# Patient Record
Sex: Female | Born: 1935 | Race: White | Hispanic: No | Smoking: Former smoker
Health system: Southern US, Community
[De-identification: ages and names within clinical notes are randomized; demographics above are authoritative.]

## PROBLEM LIST (undated history)

## (undated) DIAGNOSIS — F0390 Unspecified dementia without behavioral disturbance: Secondary | ICD-10-CM

## (undated) DIAGNOSIS — G9340 Encephalopathy, unspecified: Secondary | ICD-10-CM

## (undated) DIAGNOSIS — K219 Gastro-esophageal reflux disease without esophagitis: Secondary | ICD-10-CM

## (undated) DIAGNOSIS — N200 Calculus of kidney: Secondary | ICD-10-CM

## (undated) DIAGNOSIS — I2699 Other pulmonary embolism without acute cor pulmonale: Secondary | ICD-10-CM

## (undated) DIAGNOSIS — I714 Abdominal aortic aneurysm, without rupture, unspecified: Secondary | ICD-10-CM

## (undated) DIAGNOSIS — K8020 Calculus of gallbladder without cholecystitis without obstruction: Secondary | ICD-10-CM

## (undated) DIAGNOSIS — I2510 Atherosclerotic heart disease of native coronary artery without angina pectoris: Secondary | ICD-10-CM

## (undated) DIAGNOSIS — K56609 Unspecified intestinal obstruction, unspecified as to partial versus complete obstruction: Secondary | ICD-10-CM

## (undated) DIAGNOSIS — J449 Chronic obstructive pulmonary disease, unspecified: Secondary | ICD-10-CM

## (undated) DIAGNOSIS — I739 Peripheral vascular disease, unspecified: Secondary | ICD-10-CM

## (undated) DIAGNOSIS — N183 Chronic kidney disease, stage 3 (moderate): Secondary | ICD-10-CM

## (undated) DIAGNOSIS — E785 Hyperlipidemia, unspecified: Secondary | ICD-10-CM

## (undated) DIAGNOSIS — D352 Benign neoplasm of pituitary gland: Secondary | ICD-10-CM

## (undated) DIAGNOSIS — I498 Other specified cardiac arrhythmias: Secondary | ICD-10-CM

## (undated) DIAGNOSIS — I10 Essential (primary) hypertension: Secondary | ICD-10-CM

## (undated) DIAGNOSIS — E870 Hyperosmolality and hypernatremia: Secondary | ICD-10-CM

## (undated) DIAGNOSIS — M1990 Unspecified osteoarthritis, unspecified site: Secondary | ICD-10-CM

## (undated) DIAGNOSIS — K567 Ileus, unspecified: Secondary | ICD-10-CM

## (undated) DIAGNOSIS — N19 Unspecified kidney failure: Secondary | ICD-10-CM

## (undated) DIAGNOSIS — F17201 Nicotine dependence, unspecified, in remission: Secondary | ICD-10-CM

## (undated) DIAGNOSIS — D649 Anemia, unspecified: Secondary | ICD-10-CM

## (undated) DIAGNOSIS — F418 Other specified anxiety disorders: Secondary | ICD-10-CM

## (undated) DIAGNOSIS — E876 Hypokalemia: Secondary | ICD-10-CM

## (undated) HISTORY — DX: Abdominal aortic aneurysm, without rupture: I71.4

## (undated) HISTORY — DX: Hyperlipidemia, unspecified: E78.5

## (undated) HISTORY — DX: Abdominal aortic aneurysm, without rupture, unspecified: I71.40

## (undated) HISTORY — DX: Gastro-esophageal reflux disease without esophagitis: K21.9

## (undated) HISTORY — PX: KIDNEY STONE SURGERY: SHX686

## (undated) HISTORY — DX: Peripheral vascular disease, unspecified: I73.9

## (undated) HISTORY — DX: Other specified anxiety disorders: F41.8

## (undated) HISTORY — DX: Chronic obstructive pulmonary disease, unspecified: J44.9

## (undated) HISTORY — DX: Essential (primary) hypertension: I10

---

## 1999-12-23 HISTORY — PX: CARDIAC CATHETERIZATION: SHX172

## 2000-03-25 ENCOUNTER — Inpatient Hospital Stay (HOSPITAL_COMMUNITY): Admission: EM | Admit: 2000-03-25 | Discharge: 2000-03-31 | Payer: Self-pay | Admitting: *Deleted

## 2000-03-27 ENCOUNTER — Encounter: Payer: Self-pay | Admitting: *Deleted

## 2000-03-27 ENCOUNTER — Encounter: Payer: Self-pay | Admitting: Cardiovascular Disease

## 2000-08-13 ENCOUNTER — Encounter: Payer: Self-pay | Admitting: Cardiovascular Disease

## 2000-08-13 ENCOUNTER — Inpatient Hospital Stay (HOSPITAL_COMMUNITY): Admission: AD | Admit: 2000-08-13 | Discharge: 2000-08-18 | Payer: Self-pay | Admitting: Cardiovascular Disease

## 2000-08-14 ENCOUNTER — Encounter: Payer: Self-pay | Admitting: Cardiovascular Disease

## 2000-08-15 ENCOUNTER — Encounter: Payer: Self-pay | Admitting: Cardiovascular Disease

## 2001-04-09 ENCOUNTER — Ambulatory Visit (HOSPITAL_COMMUNITY): Admission: RE | Admit: 2001-04-09 | Discharge: 2001-04-09 | Payer: Self-pay | Admitting: Pulmonary Disease

## 2001-08-25 ENCOUNTER — Emergency Department (HOSPITAL_COMMUNITY): Admission: EM | Admit: 2001-08-25 | Discharge: 2001-08-25 | Payer: Self-pay | Admitting: Emergency Medicine

## 2001-08-25 ENCOUNTER — Encounter: Payer: Self-pay | Admitting: Emergency Medicine

## 2001-11-16 ENCOUNTER — Inpatient Hospital Stay (HOSPITAL_COMMUNITY): Admission: AD | Admit: 2001-11-16 | Discharge: 2001-11-17 | Payer: Self-pay | Admitting: *Deleted

## 2001-11-16 ENCOUNTER — Encounter: Payer: Self-pay | Admitting: Cardiology

## 2002-01-04 ENCOUNTER — Encounter: Payer: Self-pay | Admitting: Cardiovascular Disease

## 2002-01-04 ENCOUNTER — Inpatient Hospital Stay (HOSPITAL_COMMUNITY): Admission: EM | Admit: 2002-01-04 | Discharge: 2002-01-07 | Payer: Self-pay | Admitting: Emergency Medicine

## 2002-01-05 ENCOUNTER — Other Ambulatory Visit: Payer: Self-pay

## 2002-01-15 ENCOUNTER — Encounter: Payer: Self-pay | Admitting: *Deleted

## 2002-01-15 ENCOUNTER — Emergency Department (HOSPITAL_COMMUNITY): Admission: EM | Admit: 2002-01-15 | Discharge: 2002-01-15 | Payer: Self-pay | Admitting: *Deleted

## 2002-06-30 ENCOUNTER — Emergency Department (HOSPITAL_COMMUNITY): Admission: EM | Admit: 2002-06-30 | Discharge: 2002-06-30 | Payer: Self-pay | Admitting: Emergency Medicine

## 2002-06-30 ENCOUNTER — Encounter: Payer: Self-pay | Admitting: Emergency Medicine

## 2002-08-10 ENCOUNTER — Emergency Department (HOSPITAL_COMMUNITY): Admission: EM | Admit: 2002-08-10 | Discharge: 2002-08-10 | Payer: Self-pay | Admitting: Emergency Medicine

## 2002-08-10 ENCOUNTER — Encounter: Payer: Self-pay | Admitting: Emergency Medicine

## 2003-02-22 ENCOUNTER — Ambulatory Visit (HOSPITAL_COMMUNITY): Admission: RE | Admit: 2003-02-22 | Discharge: 2003-02-22 | Payer: Self-pay | Admitting: Urology

## 2003-02-22 ENCOUNTER — Encounter: Payer: Self-pay | Admitting: Urology

## 2003-05-04 ENCOUNTER — Inpatient Hospital Stay (HOSPITAL_COMMUNITY): Admission: EM | Admit: 2003-05-04 | Discharge: 2003-05-10 | Payer: Self-pay | Admitting: Emergency Medicine

## 2003-05-04 ENCOUNTER — Encounter: Payer: Self-pay | Admitting: Emergency Medicine

## 2004-02-05 ENCOUNTER — Emergency Department (HOSPITAL_COMMUNITY): Admission: EM | Admit: 2004-02-05 | Discharge: 2004-02-05 | Payer: Self-pay | Admitting: Emergency Medicine

## 2004-03-24 ENCOUNTER — Inpatient Hospital Stay (HOSPITAL_COMMUNITY): Admission: EM | Admit: 2004-03-24 | Discharge: 2004-03-28 | Payer: Self-pay | Admitting: Emergency Medicine

## 2004-06-20 ENCOUNTER — Emergency Department (HOSPITAL_COMMUNITY): Admission: EM | Admit: 2004-06-20 | Discharge: 2004-06-20 | Payer: Self-pay | Admitting: Emergency Medicine

## 2004-09-10 ENCOUNTER — Emergency Department (HOSPITAL_COMMUNITY): Admission: EM | Admit: 2004-09-10 | Discharge: 2004-09-10 | Payer: Self-pay | Admitting: Emergency Medicine

## 2004-10-10 ENCOUNTER — Encounter (HOSPITAL_COMMUNITY): Admission: RE | Admit: 2004-10-10 | Discharge: 2004-11-09 | Payer: Self-pay | Admitting: Orthopaedic Surgery

## 2004-12-23 ENCOUNTER — Emergency Department (HOSPITAL_COMMUNITY): Admission: EM | Admit: 2004-12-23 | Discharge: 2004-12-23 | Payer: Self-pay | Admitting: Emergency Medicine

## 2004-12-27 ENCOUNTER — Ambulatory Visit (HOSPITAL_COMMUNITY): Admission: RE | Admit: 2004-12-27 | Discharge: 2004-12-27 | Payer: Self-pay | Admitting: Emergency Medicine

## 2005-09-02 ENCOUNTER — Ambulatory Visit (HOSPITAL_COMMUNITY): Admission: RE | Admit: 2005-09-02 | Discharge: 2005-09-02 | Payer: Self-pay | Admitting: Orthopaedic Surgery

## 2005-09-02 ENCOUNTER — Encounter: Payer: Self-pay | Admitting: Orthopedic Surgery

## 2006-05-05 ENCOUNTER — Ambulatory Visit (HOSPITAL_COMMUNITY): Admission: RE | Admit: 2006-05-05 | Discharge: 2006-05-05 | Payer: Self-pay | Admitting: Pulmonary Disease

## 2006-05-22 ENCOUNTER — Emergency Department (HOSPITAL_COMMUNITY): Admission: EM | Admit: 2006-05-22 | Discharge: 2006-05-22 | Payer: Self-pay | Admitting: Emergency Medicine

## 2006-09-12 ENCOUNTER — Emergency Department (HOSPITAL_COMMUNITY): Admission: EM | Admit: 2006-09-12 | Discharge: 2006-09-12 | Payer: Self-pay | Admitting: Emergency Medicine

## 2006-09-17 ENCOUNTER — Emergency Department (HOSPITAL_COMMUNITY): Admission: EM | Admit: 2006-09-17 | Discharge: 2006-09-18 | Payer: Self-pay | Admitting: Emergency Medicine

## 2007-01-18 ENCOUNTER — Ambulatory Visit (HOSPITAL_COMMUNITY): Admission: RE | Admit: 2007-01-18 | Discharge: 2007-01-18 | Payer: Self-pay | Admitting: Pulmonary Disease

## 2007-04-27 ENCOUNTER — Ambulatory Visit (HOSPITAL_COMMUNITY): Admission: RE | Admit: 2007-04-27 | Discharge: 2007-04-27 | Payer: Self-pay | Admitting: Neurosurgery

## 2007-08-16 ENCOUNTER — Encounter: Payer: Self-pay | Admitting: Neurosurgery

## 2007-08-20 ENCOUNTER — Inpatient Hospital Stay (HOSPITAL_COMMUNITY): Admission: EM | Admit: 2007-08-20 | Discharge: 2007-08-22 | Payer: Self-pay | Admitting: Emergency Medicine

## 2007-09-22 HISTORY — PX: TRANSPHENOIDAL / TRANSNASAL HYPOPHYSECTOMY / RESECTION PITUITARY TUMOR: SUR1382

## 2007-10-15 ENCOUNTER — Encounter (INDEPENDENT_AMBULATORY_CARE_PROVIDER_SITE_OTHER): Payer: Self-pay | Admitting: Neurosurgery

## 2007-10-15 ENCOUNTER — Ambulatory Visit: Payer: Self-pay | Admitting: Internal Medicine

## 2007-10-15 ENCOUNTER — Inpatient Hospital Stay (HOSPITAL_COMMUNITY): Admission: RE | Admit: 2007-10-15 | Discharge: 2007-10-30 | Payer: Self-pay | Admitting: Neurosurgery

## 2009-09-27 ENCOUNTER — Ambulatory Visit (HOSPITAL_COMMUNITY)
Admission: RE | Admit: 2009-09-27 | Discharge: 2009-09-27 | Payer: Self-pay | Admitting: Physical Medicine and Rehabilitation

## 2009-09-27 ENCOUNTER — Encounter: Payer: Self-pay | Admitting: Orthopedic Surgery

## 2010-02-18 ENCOUNTER — Ambulatory Visit: Payer: Self-pay | Admitting: Cardiology

## 2010-06-04 ENCOUNTER — Encounter: Payer: Self-pay | Admitting: Orthopedic Surgery

## 2010-06-06 ENCOUNTER — Ambulatory Visit: Payer: Self-pay | Admitting: Orthopedic Surgery

## 2010-06-06 DIAGNOSIS — M1610 Unilateral primary osteoarthritis, unspecified hip: Secondary | ICD-10-CM | POA: Insufficient documentation

## 2010-06-20 ENCOUNTER — Encounter: Payer: Self-pay | Admitting: Orthopedic Surgery

## 2010-06-27 ENCOUNTER — Encounter: Payer: Self-pay | Admitting: Orthopedic Surgery

## 2010-10-22 ENCOUNTER — Encounter: Payer: Self-pay | Admitting: Orthopedic Surgery

## 2011-01-23 NOTE — Letter (Signed)
Summary: *Orthopedic Consult Note  [facility]  [address]  [place], [place] [address]   Phone: [phone]  Fax: [phone]    Re:    [name] [name] DOB:    April 01, 1935   Dear: Dr. Juanetta Gosling   Thank you for requesting that we see the above patient for consultation.  A copy of the detailed office note will be sent under separate cover, for your review.  Evaluation today is consistent with:  1)  ARTHRITIS, HIPS, BILATERAL (ICD-716.95)   Our recommendation is for: cardiac and medical evaluation, is she a candidate for hip replacement surgery at the [facility], if not then oral narcotic pain medication      Thank you for this opportunity to look after your patient.  Sincerely,   Terrance Mass. MD.

## 2011-01-23 NOTE — Letter (Signed)
Summary: History form  History form   Imported By: Jacklynn Ganong 06/10/2010 08:05:54  _____________________________________________________________________  External Attachment:    Type:   Image     Comment:   External Document

## 2011-01-23 NOTE — Miscellaneous (Signed)
Summary: Medicaation order  Medicaation order   Imported By: Jacklynn Ganong 06/28/2010 07:41:39  _____________________________________________________________________  External Attachment:    Type:   Image     Comment:   External Document

## 2011-01-23 NOTE — Miscellaneous (Signed)
Summary: Nursing Home order  Nursing Home order   Imported By: Cammie Sickle 06/11/2010 06:38:41  _____________________________________________________________________  External Attachment:    Type:   Image     Comment:   External Document

## 2011-01-23 NOTE — Medication Information (Signed)
Summary: prescription faxed to [facility]  prescription faxed to [facility]   Imported By: Jacklynn Ganong 10/22/2010 11:48:21  _____________________________________________________________________  External Attachment:    Type:   Image     Comment:   External Document

## 2011-01-23 NOTE — Assessment & Plan Note (Signed)
Summary: HIP PAIN/NEEDS XRAY/REF [email or web address]/MEDICARE,MEDICAID/CAF   Vital Signs:  Patient profile:   76 year old female Height:      63 inches Weight:      152 pounds Pulse rate:   74 / minute Resp:     16 per minute  Vitals Entered By: Fuller Canada MD (June 06, 2010 9:10 AM)  Visit Type:  Initial Consult Referring Provider:  Dr. Juanetta Gosling Primary Provider:  Dr. Juanetta Gosling  CC:  hip pain.  History of Present Illness: I saw [name] [name] in the office today for an initial visit.  She is a 76 years old woman with the complaint of:  bilateral hip pain.  Xrays bilateral hips and pelvis on 09/27/09 for review.  MRI L spine 2006 for review also.  Meds: Simvastatin, Norvasc, Lovaza, Xanax, Gas x, Pripilix         The patient presents with  hip pain.  The symptoms began 3 years ago.  On a scale of 1 to 10, the intensity is described as a 7/10.  the patient already is known to have avascular necrosis and grade 4 arthritis of both hips.  She was scheduled previously to be treated surgically for hip replacement but it was delayed.  Since that time she's had 2 myocardial infarctions the last one 5 years ago.  Her cardiologist is Dr. Tresa Endo and her general opinion is that she cannot have hip surgery.  She does not have any chest pain currently it is a minimal household ambulator with a rolling walker and occasionally gets short of breath.  She has bilateral hip pain and stiffness which is unrelieved by Tylenol.  She also has lumbar disc disease and is followed for that by a pain management specialist.    Allergies (verified): 1)  ! * Lidoderm 2)  ! Sulfa 3)  ! Nsaids  Past History:  Past Medical History: pancreatitis COPD CAD HTN OA Pituitary adenoma cholesterol anxiety  Past Surgical History: na  Family History: Family History Coronary Heart Disease female < 12 Family History of Arthritis  Social History: retired no smoking no alcohol some caffeine use  Review of  Systems Constitutional:  Denies weight loss, weight gain, fever, chills, and fatigue. Cardiovascular:  Denies chest pain, palpitations, fainting, and murmurs. Respiratory:  Denies short of breath, wheezing, couch, tightness, pain on inspiration, and snoring . Gastrointestinal:  Denies heartburn, nausea, vomiting, diarrhea, constipation, and blood in your stools. Genitourinary:  Denies frequency, urgency, difficulty urinating, painful urination, flank pain, and bleeding in urine. Neurologic:  Complains of unsteady gait; denies numbness, tingling, dizziness, tremors, and seizure. Musculoskeletal:  Complains of joint pain, stiffness, and muscle pain; denies swelling, instability, redness, and heat. Endocrine:  Denies excessive thirst, exessive urination, and heat or cold intolerance. Psychiatric:  Denies nervousness, depression, anxiety, and hallucinations. Skin:  Denies changes in the skin, poor healing, rash, itching, and redness. HEENT:  Denies blurred or double vision, eye pain, redness, and watering. Immunology:  Denies seasonal allergies, sinus problems, and allergic to bee stings. Hemoatologic:  Denies easy bleeding and brusing.  Physical Exam  Skin:  intact without lesions or rashes Inguinal Nodes:  no significant adenopathy Psych:  alert and cooperative; normal mood and affect; normal attention span and concentration   Hip Exam  General:    Well-developed,well-nourished,normal body habitus;no deformities, normal grooming.  Gait:    rolling walker shuffling gait  Inspection:    no deformity leg lengths are equal  Palpation:    tenderness over bilateral greater  trochanter LEFT greater than RIGHT  Vascular:    The pulses and perfusion were normal with normal color, temperature  and no swelling   Motor:    Motor strength 5/5 bilaterally for quadriceps, hamstrings, ankle dorsiflexion, ankle plantar flexion, .  Reflexes:    Normal and symmetric patellar and Achilles  reflexes bilaterally.    Hip Exam:    the patient is in so much pain that she only has 20-30 of hip flexion she has severe pain with internal rotation.  Her muscle tone in her legs is normal.  Her hips are stable from her we can tell.   Impression & Recommendations:  Problem # 1:  ARTHRITIS, HIPS, BILATERAL (ICD-716.95) Assessment New  x-rays AP pelvis AP lateral RIGHT in LEFT hips bilateral avascular necrosis with end-stage arthritis of the hips.  3 views RIGHT knee 3 views LEFT knee there is subtle joint space narrowing laterally and there's diffuse osteopenia bilaterally  MRI dated September 02, 2005 mild lumbar spondylosis with disc bulges starting at L2 and 3 and progressing through L5 and S1  This patient is not a surgical candidate according to the cardiologist and to her primary care physician.  She needs to be seen by the cardiologist again to make sure that she is not a surgical candidate.  If he think she can have the surgery would have to be done at [facility]  If not then pain medication is her only option.  She is not a candidate for injection.  She is not a candidate for therapy.  Orders: Consultation Level III (04540)  Medications Added to Medication List This Visit: 1)  Norco 5-325 Mg Tabs (Hydrocodone-acetaminophen) .Marland Kitchen.. 1 q 4 for pain  Patient Instructions: 1)  Please schedule a follow-up appointment as needed. 2)  not a candidate for surgery or injection 3)  REC ORAL NARCOTICS as needed  Prescriptions: NORCO 5-325 MG TABS (HYDROCODONE-ACETAMINOPHEN) 1 q 4 for pain  #90 x 5   Entered and Authorized by:   Fuller Canada MD   Signed by:   Fuller Canada MD on 06/06/2010   Method used:   Print then Give to Patient   RxID:   [id number]

## 2011-01-23 NOTE — Letter (Signed)
Summary: Office note from Dr. Kari Baars  Office note from Dr. Kari Baars   Imported By: Jacklynn Ganong 06/27/2010 09:33:04  _____________________________________________________________________  External Attachment:    Type:   Image     Comment:   External Document

## 2011-05-06 NOTE — Op Note (Signed)
NAME:  Scheurich, [name]              ACCOUNT NO.:  [id number]   MEDICAL RECORD NO.:  [id number]          PATIENT TYPE:  INP   LOCATION:  3112                         FACILITY:  [facility]   PHYSICIAN:  Coletta Memos, M.D.     DATE OF BIRTH:  1935/04/17   DATE OF PROCEDURE:  10/15/2007  DATE OF DISCHARGE:                               OPERATIVE REPORT   PREOPERATIVE DIAGNOSIS:  Pituitary tumor.   POSTOPERATIVE DIAGNOSIS:  Pituitary tumor.   PROCEDURES:  1. Resection of pituitary tumor with microdissection.  2. Abdominal fat graft taken via abdominal incision.   COMPLICATIONS:  None.   SURGEON:  Coletta Memos, M.D.   Mammie LorenzoEnrigue Catena H. Pollyann Kennedy, MD.  He will dictate the approach and  closure under separate note.   [name] [name] presented to me well over a year ago for a pituitary  tumor.  Secondary to a number of family issues and cardiac issues which  occurred recently, she had not been able to get her operation until  recently.  She has bilateral pressure on the optic nerves.  The tumor  essentially has remained stable from May 2007 to May 2008.  She was  admitted today for her tumor resection.   COMPLICATIONS:  None.   OPERATIVE NOTE:  Ms. Bowens was brought to the operating room,  intubated, placed under general anesthetic without difficulty.  A Foley  catheter was placed under sterile conditions.  She was positioned with  her head on a horseshoe headrest.  Fluoroscopy was set up so that we  could ensure that we were in the correct location.  After that was done,  Dr. Pollyann Kennedy exposed the sphenoid sinus.  At that point I took over.   With the microscope I was able to see what appeared to be dura, and I  was actually low in the sellar sphenoid sinus.  This was because the  tumor had eroded so much of the bone that I did not need any tool to  remove bone or go through bone as the dura was present once Dr. Pollyann Kennedy  entered the sphenoid sinus.  I coagulated the dura.  I then opened  the  dura in a cruciate fashion in the shape frankly of an X.  I then  immediately got grayish-white, milky substance and fluid, somewhat  thicker than milk obviously, but that came out under its own power  without any significant dissection.  I then proceeded to remove the  quadrants of the tumor first by starting the floor, then taking out the  right side, left side, and finally the roof of the tumor.  I did this  until I saw the diaphragma collapse into my resection cavity.  At that  point in time I then proceeded to take the abdominal fat graft.  This  was done without difficulty, first using a #10 blade to open the  abdomen.  I then removed the fat using Metzenbaum scissors.  I then  closed wound after achieving hemostasis with 3-0 Vicryl sutures, closing  it in layers.  Dermabond was used for a  sterile dressing.  That was done under sterile conditions.  I then place  a small piece of fat into the sphenoid sinus to help with hemostasis.  At that point Dr. Pollyann Kennedy closed the wound and, again, that will be  dictated under a separate note.           ______________________________  Coletta Memos, M.D.     KC/MEDQ  D:  10/15/2007  T:  10/16/2007  Job:  [id number]

## 2011-05-06 NOTE — Discharge Summary (Signed)
NAME:  [name] [name], [name] [name]              ACCOUNT NO.:  [id number]   MEDICAL RECORD NO.:  [id number]          PATIENT TYPE:  INP   LOCATION:  4732                         FACILITY:  [facility]   PHYSICIAN:  Nicki Guadalajara, M.D.     DATE OF BIRTH:  1935/10/02   DATE OF ADMISSION:  08/20/2007  DATE OF DISCHARGE:  08/22/2007                               DISCHARGE SUMMARY   DISCHARGE DIAGNOSES:  1. Acute tachycardia, probable atrial flutter with a right bundle      branch block, now resolved.  2. Hypotension, improved.  3. History of paroxysmal atrial fibrillation; at one point on      Betapace, but has not been on since at least May of 2008.  4. Coronary artery disease with previous left anterior descending and      right coronary artery stent in [place], [place] in April of      2001.  5. Known abdominal aortic aneurysm followed by Dr. Tresa Endo.  6. History of severe chronic obstructive pulmonary disease.   Dictation ended at this point.      Darcella Gasman. Annie Paras, N.P.    ______________________________  Nicki Guadalajara, M.D.    LRI/MEDQ  D:  08/22/2007  T:  08/22/2007  Job:  [id number]   cc:   Ramon Dredge L. Juanetta Gosling, M.D.  Coletta Memos, M.D.

## 2011-05-06 NOTE — Consult Note (Signed)
NAME:  [name], [name]              ACCOUNT NO.:  [id number]   MEDICAL RECORD NO.:  [id number]          PATIENT TYPE:  INP   LOCATION:  3112                         FACILITY:  [facility]   PHYSICIAN:  Alfonse Alpers. Gegick, M.D.DATE OF BIRTH:  [birth year]   DATE OF CONSULTATION:  10/20/2007  DATE OF DISCHARGE:                                 CONSULTATION   REASON FOR CONSULTATION:  This is a 76 year old woman who has had a  history of known pituitary disease for approximately 2 years.  She has  had field of vision deficits due to a pituitary tumor and is admitted to  the hospital and had a pituitary tumor resection.  We are seeing her  postoperatively on her fifth day.  At this time, her treatment includes  25 mg of hydrocortisone three times a day.  She also has a history of  schizophrenia making history taking from the patient impossible.  She is  currently hallucinating at this time.   PAST MEDICAL HISTORY:  1. History of arteriosclerotic heart disease.  2. According to the record, she also has a history of chronic      obstructive pulmonary disease.  3. History of cigarette smoking.  4. She had two stents placed several years ago.  5. She is also being followed by cardiology during her hospitalization      for tachyarrhythmias that are occurring.   Her past medical history is essentially as noted in the records and  obtainable from the patient.  She does have a history of his heart  disease as noted above.  Her electrocardiograms actually show low  voltage.   PHYSICAL EXAMINATION:  GENERAL:  This a well-developed woman seen in bed  lying down in restraints.  She is obviously pointing in different  directions as if she is seeing things which would be consistent with her  hallucinations.  CARDIAC:  Her cardiovascular exam reveals the rhythm to be regular.  LUNGS:  Clear.  ABDOMEN:  Soft.  EXTREMITIES:  There is no edema present.   IMPRESSION/RECOMMENDATIONS:  During her hospital  stay, she does not  appear to have any evidence of diabetes insipidus.  However, her last  two in's and out's indicate a negative fluid balance of -530 mL and -810  mL.  I imagine there is some complication and some difficulty in  actually obtaining the values with this patient.  Her thyroid function  studies were done recently.  Her T4 was 6.1.  TSH was 0.9.  More than  likely, her pituitary is intact and her hydrocortisone will be gradually  decreased at this time.           ______________________________  Alfonse Alpers Dagoberto Ligas, M.D.     CGG/MEDQ  D:  10/20/2007  T:  10/21/2007  Job:  [id number]

## 2011-05-06 NOTE — Discharge Summary (Signed)
NAME:  [name], [name]              ACCOUNT NO.:  [id number]   MEDICAL RECORD NO.:  [id number]          PATIENT TYPE:  INP   LOCATION:  4732                         FACILITY:  [facility]   PHYSICIAN:  [name] [name], M.D.     DATE OF BIRTH:  1935-08-09   DATE OF ADMISSION:  08/20/2007  DATE OF DISCHARGE:  08/22/2007                               DISCHARGE SUMMARY   DISCHARGE DIAGNOSES:  1. Atrial flutter with a heart rate of 136, resolved.  2. Hypotension, improved.  3. Elevated creatinine and renal insufficiency, improved at discharge.  4. Pituitary tumor followed by Dr. Franky [name], was here at [facility] for      surgery, which was cancelled  5. Coronary artery disease, previous left anterior descending and      right coronary artery stent in [place], 2001.  6. Severe chronic obstructive pulmonary disease on home oxygen.  7. Carotid disease with a 0-49% bilateral carotid stenosis.  8. Normal left ventricular function.  9. Abdominal aortic aneurysm followed by Dr. Tresa [name].  10.History of nephrolithiasis.  11.Urinary tract infection.   DISCHARGE CONDITION:  Improved, sinus rhythm.   DISCHARGE MEDICATIONS:  1. Nexium 40 mg daily.  2. Enteric-coated aspirin 81 mg daily.  3. Cardizem CD 120 mg daily.  4. Cipro 250 mg daily for 3 days.  5. Oxygen 2 L at home as before.   HISTORY OF PRESENT ILLNESS:  A 76 year old female who is followed by Dr.  Tresa [name] and Dr. Juanetta [name] in [place] with a history of coronary disease  with LAD and RCA stent in [place] in 2001.  She also has a history  of PAF, had been on Betapace at one point but is no longer as of May  2008.  She had been seen by Dr. Tresa [name] for surgical clearance back in May  for pituitary surgery by Dr. Franky [name].  A 2-D echo and Myoview study were  done.  There was no ischemia on the Myoview, study EF was 81%.  The echo  was normal.  She also carotid Dopplers with 0-49% bilateral internal  carotid artery narrowing, and upper extremity  Dopplers were normal.   The patient had presented to the emergency room August 20, 2007, as she  came from being prepared for surgery with tachycardia, heart rate 136,  appears to be atrial flutter on EKG.  She was admitted to [facility].  Cardizem was given.  It was also noted her blood pressure was 86/50.  She converted to sinus rhythm up with her Cardizem, and this was changed  to p.o. Cardizem.  Additionally, her creatinine was 2, BUN was 55, so  her Lasix and lisinopril were held.   PAST MEDICAL HISTORY:  1. As stated above.  2. Severe COPD with multiple admissions for bronchitis, quit tobacco 2      years ago.  3. She has dyslipidemia, not on medication.  4. Coronary artery disease as stated.  5. A history of kidney stones.  6. Degenerative joint disease in her right hip and may need surgery      there as well.  7. She has dizziness and  falls at home.   OUTPATIENT MEDICATIONS:  1. Lasix 40 mg daily.  2. Lisinopril 40 mg.  3. Naprosyn b.i.d.  4. Nexium 40 mg daily.  5. Xanax p.r.n.   Family history, social history, review of systems:  Please see H&P.   PHYSICAL EXAMINATION ON DISCHARGE:  Blood pressure anywhere from 92/58  to 154/88, pulse 69, respirations 20, temperature 92.8 __________,  oxygen saturation 98% room air, I&O's pretty equivocal.  On exam, lungs were clear to auscultation.  HEART:  Regular rate and rhythm.   LABORATORY DATA:  Hemoglobin initially 13.3, hematocrit 39.6, WBC 6.8,  platelets 215.  At discharge, hemoglobin 11.1, hematocrit 32.8, WBC 5.9,  platelets a little low at 146, and MCV 84.6.  Chemistry:  Initially  creatinine was 2, at discharge it is down to 1.26.  sodium is 140,  potassium 4.0, chloride 112, CO2 25, BUN 22, glucose 98.  Coagulation  studies:  PT 12.9, INR 1.0, PTT 30.  LFTs were normal except albumin was  a little low at 3.3.  Cardiac enzymes were negative:  CK was 34 and 35  with MBs of 1.0 and 0.8, troponin I 0.04 x2.  Calcium 9.2.   UA:  Large  leukocytes, bacteria, too many wbc's to count, culture is pending.  TSH  3.053.   Chest x-ray:  This was on the 25th in preparation for surgery, no  evidence for acute cardiopulmonary disease.  Scoliosis is present.   EKG initially was tachycardia, right bundle branch block, appears to be  flutter.  She was started on Cardizem and converted.  Follow-up EKG:  Sinus bradycardia, rate of 57, right bundle branch block.   HOSPITAL COURSE:  The patient was admitted August 20, 2007, by Dr.  Jenne [name] on call for Dr. Tresa [name], secondary to PAF, probably 2:1 flutter.  She was also found to be hypotensive and dehydrated.  IV fluids were  given.  IV Cardizem was changed to p.o.   She continued to be stable and by August 22, 2007, she had negative MI,  creatinine was down to 1.26.  She did have a urinary tract infection on  urinalysis.  Blood pressure was improved but it still would drop down to  92, so therefore her medications were held and she will follow up with  Dr. Tresa [name] in a week, either in [place] or [place] to further  evaluate her for surgery due to the atrial flutter.  Will recheck of  basic metabolic panel as an outpatient and a CBC to ensure platelets  continued to climb.   DISCHARGE MEDICATIONS:  1. Nexium 40 mg daily.  2. Enteric-coated aspirin 81 mg daily.  3. Cardizem CD 120 mg daily.  4. Cipro 250 mg once daily x3 days.  5. Xanax as before.  6. O2 2 L as before.  7. Stop Lasix, lisinopril and naproxen.   She will also have blood work done on Tuesday August 24, 2007, in  [place].      [name] [name], N.P.    ______________________________  [name] [name], M.D.    LRI/MEDQ  D:  08/22/2007  T:  08/23/2007  Job:  [id number]   cc:   [name] [name], M.D.  [name] [name], M.D.

## 2011-05-06 NOTE — Discharge Summary (Signed)
NAME:  [name] [name], [name] [name]              ACCOUNT NO.:  [id number]   MEDICAL RECORD NO.:  [id number]          PATIENT TYPE:  INP   LOCATION:  3022                         FACILITY:  [facility]   PHYSICIAN:  Coletta Memos, M.D.     DATE OF BIRTH:  [birth year]   DATE OF ADMISSION:  10/15/2007  DATE OF DISCHARGE:  10/30/2007                               DISCHARGE SUMMARY   ADMISSION DIAGNOSES:  1. Pituitary macroadenoma.  2. Decreased vision bilaterally.   DISCHARGE DIAGNOSES:  1. Pituitary macroadenoma.  2. Cardiac arrhythmia.  She had known coronary artery disease and      developed tachycardia with some blood pressure changes while here      in the hospital.  She had a history of atrial fibrillation in the      past.   [name] was admitted, taken to the operating room on October 15, 2007, where she underwent frankly an uncomplicated transsphenoidal  pituitary tumor resection.  Postoperatively, with regards with an  endocrine status, she has done quite well.  She was seen by Dr. Dagoberto Ligas  and he has been able to withdraw hydrocortisone and has been managing  her other levels.  She is doing quite well with regards to that.  Ms.  Corona had this macroadenoma for quite some time and she did have some  visual field damage as a result of it.  She also developed some  arrhythmias postoperatively.  She also had a brief episode of psychosis  which was well controlled and at discharge she has not been on any  medications for least a week.  She was treated by the cardiologist and  they increased her diltiazem for a period of time, but she will be  discharged home on the same dose that she was when she arrived.  She is  off hydrocortisone at discharge.  She will be seen by me in the office  in approximately 1 month postop.  She has done quite well.  Pathology  showed epithelial neoplasm consistent with pituitary adenoma.           ______________________________  Coletta Memos, M.D.     KC/MEDQ   D:  10/29/2007  T:  10/30/2007  Job:  [id number]

## 2011-05-06 NOTE — H&P (Signed)
NAME:  [name], [name]              ACCOUNT NO.:  [id number]   MEDICAL RECORD NO.:  [id number]          PATIENT TYPE:  INP   LOCATION:  2899                         FACILITY:  [facility]   PHYSICIAN:  Nicki Guadalajara, M.D.     DATE OF BIRTH:  05-26-1935   DATE OF ADMISSION:  08/20/2007  DATE OF DISCHARGE:  08/20/2007                              HISTORY & PHYSICAL   CHIEF COMPLAINT:  Rapid heart rate and weakness.   HPI:  Ms. [name] [name] is a 76 year old female who has been followed by Dr.  Tresa Endo and Dr. Juanetta Gosling.  She is from [place].  She has a history of  coronary disease.  She had had a previous LAD and RCA stent in  [place] in April of 2001.  Apparently, this was complicated by  bleeding, it is not clear whether this was a groin bleed or a GI bleed.  She ended up being transfused.  She has been studied twice since, in  August of 2001 and in January of 2003 and at that time her coronaries  were patent.  She last saw Dr. Tresa Endo in May of 2008.  She apparently has  been noncompliant with her medications.  She apparently has a history of  atrial fibrillation and was on Betapace in the past but she was not on  this when she saw Dr. Tresa Endo in May of 2008.  She was supposed to have  pituitary surgery by Dr. Franky Macho.  Dr. Tresa Endo ordered an echocardiogram  and Myoview study.  When he saw her in the office she was in sinus  rhythm.  Myoview study done June 10, 2007, shows normal perfusion with  an EF of 81%.  Echocardiogram revealed normal LV function.  She had also  had upper extremity Dopplers done because of some trouble getting her  blood pressure in her upper arms.  We were concerned that she may have  subclavian artery stenosis.  Carotid Dopplers done June 10, 2007, shows  a 0 to 49% bilateral internal carotid artery narrowing.  There were  normal upper extremity Dopplers.  The patient is admitted to the  emergency room today after she had rapid atrial fibrillation with  hypotension while in  the preop holding area.  She was relatively  asymptomatic with this.  In the emergency room she is now in sinus  rhythm after Cardizem but her pressure remains in the 80's.  Her BUN and  creatinine are elevated, although it is not clear where baseline is.  In  the ER, her BUN is 55, creatinine 2.  From a cardiac standpoint she is  relatively asymptomatic, she denies any chest pain or shortness of  breath.  Her daughter is with her and says that she has frequent falls  at home and is frequently dizzy.  She will admitted now for further  evaluation.   CURRENT MEDICATIONS:  1. Lasix 40 mg a day.  2. Lisinopril 40 mg a day.  3. Naprosyn b.i.d.  4. Nexium 40 mg a day.  5. Xanax p.r.n.   SHE IS ALLERGIC TO SULFA.   PAST MEDICAL HISTORY:  Remarkable for a known abdominal aortic aneurysm  which Dr. Tresa Endo follows.  She has a history of severe COPD, she had had  multiple admissions in the past in [facility] for bronchitis, she quit  smoking 2 years ago.  She has treated dyslipidemia.  She has a history  of PAF in the past, she had been on Betapace at one point but does not  take it now, and I see no indication that she was on Coumadin.   SOCIAL HISTORY:  The patient is a widow, lives alone and takes care of  her 74 year old mother.  She has 5 children, 13 grandchildren, 23 great-  grandchildren and 8 great-great grandchildren.  As noted above, she quit  smoking 2 years ago.   FAMILY HISTORY:  Unremarkable.   REVIEW OF SYSTEMS:  Remarkable for a history of kidney stones.  She has  had previous surgery for this.  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  As noted above, she has  had dizziness and falls at home.   PHYSICAL EXAM:  Blood pressure 86/50, pulse initially was 133, now 56,  respirations 12.  GENERAL:  She is an elderly female who looks older than her stated age  of 53, in no acute distress.  HEENT:  Normocephalic.  Extraocular  movements are intact.  Sclera is  nonicteric.  NECK:  Without JVD or bruit.  CHEST:  Scoliosis with scattered rhonchi.  CARDIAC EXAM:  Diminished heart sounds with a regular rate and rhythm,  no obvious murmur.  ABDOMEN:  Tenderness diffusely.  She has a left upper quadrant surgical  scar.  I could detect no bruit.  EXTREMITIES:  Diminished distal pulses but no edema.  There are no  femoral artery bruits noted.  Skin is warm and dry.   LABS:  Sodium 137, potassium 3.7, BUN 55, creatinine 2, white count 8.5,  hemoglobin 12.9, hematocrit 37.8, platelets 176,000.  Troponin is  negative.  EKG in sinus rhythm, showed sinus rhythm without acute  changes.  Chest x-ray shows scoliosis, COPD.   IMPRESSION:  1. Paroxysmal atrial fibrillation.  2. Hypotension, suspected dehydration.  3. Known coronary disease right coronary artery and left anterior      descending, percutaneous coronary intervention in April of 2001 in      [place] with documented patency, August, 2001, January, 2003,      and nonischemic Myoview June of 2008.  4. Preserved left ventricular function.  5. Severe chronic obstructive pulmonary disease.  6. Known peripheral vascular disease with abdominal aortic aneurysm,      followed by Dr. Tresa Endo.  7. Renal insufficiency.  8. History of kidney stones.  9. Pituitary tumor, followed by [facility].   PLAN:  1. Patient's surgery has been cancelled.  2. She will be admitted to Telemetry.  3. She will be hydrated.  4. Will follow her renal function, will hold her ACE inhibitor and      diuretics for now.  5. The patient does have a poor social situation at home and the      daughter is aware of this.  This may need to be addressed at      discharge.      Abelino Derrick, P.A.    ______________________________  Nicki Guadalajara, M.D.   Lenard Lance  D:  08/20/2007  T:  08/20/2007  Job:  [id number]

## 2011-05-06 NOTE — Consult Note (Signed)
NAME:  [name] [name], [name] [name]              ACCOUNT NO.:  [id number]   MEDICAL RECORD NO.:  [id number]          PATIENT TYPE:  INP   LOCATION:  3112                         FACILITY:  [facility]   PHYSICIAN:  Antonietta Breach, M.D.  DATE OF BIRTH:  1935/10/17   DATE OF CONSULTATION:  10/20/2007  DATE OF DISCHARGE:                                 CONSULTATION   REQUESTING PHYSICIAN:  Coletta Memos, M.D.   REASON FOR CONSULTATION:  Psychosis.   HISTORY OF PRESENT ILLNESS:  [name] [name] is a 76 year old female  admitted to the [facility] on October 05, 2007 with a pituitary  gland neoplasm.  She has erupted with approximately 72 hours of  progressive delusions and hallucinations.  She has been severely  agitated and has required restraints in the intensive care unit.  The  patient has received 2 mg of Haldol late last night, 2 mg of Haldol this  morning as well as another 1 mg today.  She still continues with  agitation and hallucinations.  She also has inappropriate euphoria.  She  has time orientation difficulties as well as partial place difficulties.  She does have intact remote memory.  The patient's daughter states that  she has been on 2 mg of Xanax a day at home for treating feeling on  edge, muscle tension and excessive worry.  The Xanax was stopped for  approximately a few days.  It has been restarted in the hospital.   PAST PSYCHIATRIC HISTORY:  [name] [name] does not have a prior history of  the kinds of symptoms noted above.  Her daughter states that in previous  hospitalization this has never occurred.  The patient does have a long  term history of requiring benzodiazepine therapy for anxiety, treating  muscle tension, feeling on edge and excessive worry.  In review of the  past medical record, Xanax is listed in April of 2001.   FAMILY PSYCHIATRIC HISTORY:  None known.   SOCIAL HISTORY:  [name] [name] lives with her 73 year old mother and takes  care of her.  The patient's  daughter reports that she has probably lost  much sleep in being the caretaker.  The patient is widowed.  She does  not use alcohol or illegal drugs.  She has 5 children, 13 grandchildren,  93 great-grandchildren and 8 great-great-grandchildren.  Her daughter is  visiting her regularly at the bedside.  The patient was talking to her  son on the phone earlier, and the son was telling the sister that the  patient was not making sense.   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   PAST MEDICAL HISTORY:  1. Pituitary tumor  2. History of aortic aneurysm.  3. Chronic obstructive pulmonary disease.  4. Bronchitis.  5. Dyslipidemia.   ALLERGIES:  Sulfa.   MEDICATIONS:  MAR is reviewed.  The patient is on the following  Psychotropics:  1. Xanax 1 mg b.i.d.  2. Haldol 1 to 2 mg every 4 hours p.r.n.  Please see the above history      regarding the patient's receiving Haldol in the  patient 24 hours.   LABORATORY DATA:  Cortisol is within normal limits at 11.2, TSH within  normal limits.  The WBC is 9.1, hemoglobin 10.1, platelet count 236, BUN  26, creatinine 2.24, glucose 111.   REVIEW OF SYSTEMS:  Constitutional:  Afebrile, no weight loss.  Head:  No trauma.  Eyes:  No visual changes.  Ears.  No hearing impairment.  Nose:  No rhinorrhea.  Mouth/Throat:  No sore throat.  Neurologic: As  above.  Psychiatric:  As above.  Cardiovascular:  No chest pain.  QTC  has been 460 milliseconds acutely and has not been over 500 milliseconds  today.  Respiratory:  No cough.  Gastrointestinal:  No vomiting.  Genitourinary: No dysuria.  Skin:  Unremarkable.  Endocrine/Metabolic:  No heat or cold intolerance.  Musculoskeletal:  No deformities.  Hematologic/Lymphatic:  Anemia.   PHYSICAL EXAMINATION:  VITAL SIGNS:  Temperature 97.5, pulse 85,  respiratory rate 20, blood pressure 147/64, O2 saturation on room air  95%.  GENERAL APPEARANCE:  [name] [name] is an elderly female  lying in a supine  position in her hospital bed, partially reclined.  She does not display  any abnormal involuntary movements.  OTHER MENTAL STATUS EXAM:  [name] [name] is alert.  Her attention span is  poor and she is easily distractible.  Concentration is decreased.  Her  eye contact is intermittent.  She is oriented to self.  She is partially  oriented to place.  She knows that she is in the hospital  intermittently.  Her memory is poor for recent, however it is intact for  remote.  She cannot cooperative with formal mental status exam  questions.  Her fund of knowledge and intelligence are below that of her  estimated premorbid baseline.  Her speech involves normal rate and  prosody; however, she has confabulation.  There is no dysarthria.  Affect is agitated with occasional inappropriate smile.  Mood is  euphoric.  Thought process involves mostly confabulation and looseness  of associations.  Her language comprehension exceeds expression.  Thought content, she clearly is being internally stimulated.  She has  delusions and hallucinations.  No thoughts of harming herself or others.  Insight is poor.  Judgment is impaired.   ASSESSMENT:  Axis I:  293.00.  Delirium due to general medical factors.          293.84.  Anxiety disorder not otherwise specified.  Rule out  generalized anxiety disorder.  Axis II:  None.  Axis III:  See general medical section.  Axis IV:  General medical primary support group.  Axis V:  15.   The undersigned discussed the case with the patient's daughter.  The  indications, alternatives and adverse affects of Haldol as well as  Zyprexa were discussed.  The daughter understands and would like to  start standing Zyprexa with a goal of reducing p.r.n. Haldol as much as  possible.   RECOMMENDATIONS:  1. We will start Zyprexa 5 mg p.o. or IM daily at 1800.  2. Recheck her QTC and discontinue antipsychotics if the QTC rises      above 500 milliseconds.  3.  Would continue the Haldol 1 to 2 mg every 4 hours p.r.n. severe      agitation or combativeness.  4. Would decrease the Xanax to 0.5 mg t.i.d.  5. The patient can be treated with an SSRI such as Celexa starting 10      mg daily if she has evidence of an anxiety  disorder which continues      to require Xanax.  This will reduce the risk of Xanax contributing      to delirium in the future.      Antonietta Breach, M.D.  Electronically Signed     JW/MEDQ  D:  10/20/2007  T:  10/21/2007  Job:  [id number]

## 2011-05-06 NOTE — Op Note (Signed)
NAME:  [name], [name]              ACCOUNT NO.:  [id number]   MEDICAL RECORD NO.:  [id number]          PATIENT TYPE:  INP   LOCATION:  3112                         FACILITY:  [facility]   PHYSICIAN:  Jefry H. Pollyann Kennedy, MD     DATE OF BIRTH:  August 21, 1935   DATE OF PROCEDURE:  10/15/2007  DATE OF DISCHARGE:                               OPERATIVE REPORT   PREOPERATIVE DIAGNOSIS:  Pituitary tumor.   POSTOPERATIVE DIAGNOSIS:  Pituitary tumor.   OPERATION PERFORMED:  Transnasal, transseptal and transsphenoidal  approach to hypophysectomy in conjunction with Dr. Coletta Memos of  neurosurgery; his portion of procedure will be dictated separately.   SURGEON:  Jefry H. Pollyann Kennedy, M.D.   ANESTHESIA:  General endotracheal anesthesia.   HISTORY:  This is a 76 year old lady with a fairly long history of  progressive worsening of visual field secondary to an enlarging  pituitary adenoma.  Risks, benefits, alternatives and complications of  the procedure were explained to the patient who seemed to understand and  agreed to the surgery.   DESCRIPTION OF THE OPERATION:  The patient was to the operating room and  was placed on the operating table in the supine position.  The head was  secured on the neurosurgical headrest and the patient was properly  positioned with the C-arm in place for this type of surgery.  Following  the induction of adequate general endotracheal anesthesia the nose was  prepped and draped in a standard fashion as was the abdomen for fat  harvest.  One percent Xylocaine with epinephrine was infiltrated into  the columella and bilateral septum.  A total of approximately 8 mL was  injected.  Afrin soaked pledgets were placed bilaterally in the nasal  cavities.   A gull-wing incision was outlined in the columella that was incised  using a  #11 scalpel.  The skin was elevated off the medial crura of the  lower lateral cartilages, and the lower lateral cartilage and the caudal  septum  were then dissected out of the columella.  A left hemitransfixion  incision was created with a 15 scalpel and mucoperichondrial flap was  developed posteriorly down the left side.  The bony cartilaginous  junction was divided and a similar flap was then developed down the  right side.  There was significant leftward septal deviation partially  from the ethmoid plate and vomerian bone, and partially from the  overhanging quadrangular cartilage on the left side.  The bony septum  was completely resected back to the base of the sphenoid.  The inferior  cartilage that was draped over the maxillary crest was also resected.  These maneuvers allowed for nice midline positioning of the septum  postoperatively.   The Hardy retractor was then used to visualize the face of the sphenoid.  The anterior wall of the sphenoid was entered using a 4 mm osteotome.  The left sinus was dominant and the entire left sphenoid was exposed  using upbiting and downbiting Kerrison rongeurs.  The inner sinus septum  was to the right of the midline and with some difficulty I was able to  remove fragments of the septum to expose the right sinus, which was much  smaller.  The sinus openings were enlarged maximally using Kerrison  rongeurs.  The sphenoid mucosa was resected.  The posterior wall the  left sphenoid was mostly dehiscent and the area overlying the tumor was  bulging into the sphenoid sinus.  There was no CSF leak.   The patient was then handed over to Dr. Franky Macho who performed the tumor  resection.  Following his portion of procedure, abdominal fat was used  to pack the sphenoid sinus.  The septal flaps were then replaced and  quilted with 4-0 plain gut.  The transfixion incision was reapproximated  with interrupted 4-0 chromic.  The gull-wing columellar incision was  reapproximated using interrupted 6-0 Prolene.  The nasal cavities were  suctioned of blood and secretions, and packed with rolled up Telfa   coated with bacitracin.  The pharynx was suctioned as well.  A dressing  was applied.   The patient was then awakened and transferred to recovery in stable  condition.      Jefry H. Pollyann Kennedy, MD  Electronically Signed     JHR/MEDQ  D:  10/15/2007  T:  10/16/2007  Job:  [id number]

## 2011-05-09 NOTE — Group Therapy Note (Signed)
   NAME:  [name] [name]                          ACCOUNT NO.:  [id number]   MEDICAL RECORD NO.:  [id number]                   PATIENT TYPE:  INP   LOCATION:  A322                                 FACILITY:  [facility]   PHYSICIAN:  Edward L. Juanetta Gosling, M.D.             DATE OF BIRTH:  [birth year]   DATE OF PROCEDURE:  05/06/2003  DATE OF DISCHARGE:                                   PROGRESS NOTE   PROBLEMS:  1. Chronic obstructive pulmonary disease exacerbation.  2. Hypertension.  3. Peripheral vascular disease.  4. Coronary artery disease.   SUBJECTIVE:  [name] [name] says she is feeling well.  She has no leg pain, has  had no chest pain.  She still is short of breath, she is still coughing  some, but not a great deal.  When she coughs she is not able to bring  anything up.   PHYSICAL EXAMINATION:  VITAL SIGNS:  Temperature 97.7, pulse 70,  respirations 20, blood pressure listed as 91/48, but it has been in the  130's, O2 saturations 94% on 2 L.  CHEST:  Fairly marked bilateral wheezes and rhonchi.   LABORATORY DATA:  Her I&O is +360.   ASSESSMENT:  She is doing better.  She is improving.   PLAN:  If she continues to improve, she may be able to be discharged  tomorrow, if not, it may be on Monday.  Otherwise, we will plan to continue  her treatments and follow her.                                               Edward L. Juanetta Gosling, M.D.    ELH/MEDQ  D:  05/06/2003  T:  05/06/2003  Job:  [id number]

## 2011-05-09 NOTE — Group Therapy Note (Signed)
NAME:  [name] [name], [name] [name]                          ACCOUNT NO.:  [id number]   MEDICAL RECORD NO.:  [id number]                   PATIENT TYPE:  INP   LOCATION:  A226                                 FACILITY:  [facility]   PHYSICIAN:  Edward L. Juanetta Gosling, M.D.             DATE OF BIRTH:  April 16, 1935   DATE OF PROCEDURE:  DATE OF DISCHARGE:                                   PROGRESS NOTE   PROBLEM:  Chronic obstructive pulmonary disease with exacerbation.   SUBJECTIVE:  [name] [name] was admitted yesterday after having increasing  problems with chronic obstructive pulmonary disease.  She says it started  four to five days ago.  She is still coughing.  She is not coughing much,  but says she feels like the oxygen and the nebulizer treatments are helping  her a great deal.   OBJECTIVE:  Her exam today shows that her temperature is 96.8, pulse 61,  respirations 20, blood pressure 137/88, O2 saturations are 97% on four  liters.  Her chest is clear.  Her heart is regular.  Her abdomen is soft.  Extremities showed no edema.   ASSESSMENT/PLAN:  She has got severe chronic obstructive pulmonary disease  with acute exacerbation.  She is somewhat improved.      ___________________________________________                                            Oneal Deputy Juanetta Gosling, M.D.   ELH/MEDQ  D:  03/25/2004  T:  03/25/2004  Job:  [id number]

## 2011-05-09 NOTE — Cardiovascular Report (Signed)
[place]. [facility]  Patient:    [name], [name]                     MRN: [id number] Adm. Date:  [id number] Attending:  Berry, Jonathan [place] CC:         Kari Baars, M.D.  [facility]; [employer]; [address]             Leeann Must   Cardiac Catheterization  INDICATIONS:  [name] is a 76 year old, white female who underwent coronary intervention with two stents being placed in the right coronary artery, and one stent being placed in her LAD in April, 2001.  She has a history of hypertension and a mild hyperlipidemia.  The patient has a remote history of peptic ulcer disease and also significant tobacco abuse.  She had [facility] yesterday with recurrent chest tightness and also back discomfort.  She has known abdominal aortic aneurysm.  Initial CPK enzymes are negative.  She is now referred for definitive catheterization to assess restenosis with distal aortography to further evaluate her aneurysm.  HEMODYNAMIC DATA:  Central aortic pressure is 190/82, and left ventricular pressure is 190/17.  ANGIOGRAPHIC DATA:  The left main coronary artery is angiographically normal. It bifurcated into an LAD and left circumflex system.  The LAD had mild 30% narrowing just after the first diagonal and before the stented segment.  There was no restenosis of the mid LAD stent site.  The mid distal LAD had focal 30% narrowing in the distal aspect of a portion that may have gone intramyocardial.  The circumflex vessel was angiographically normal and gave rise to a major, proximal marginal vessel which was like a ramus intermediate system.  The A-V groove circumflex was small caliber and normal.  The right coronary artery is a large caliber vessel.  There were tandem stents in place in the proximal to mid to mid segment without significant restenosis.  There is narrowing, at most  10%, secondary to intimal hyperplasia.  There was TIMI III flow.  ______ revealed preserved global LV contractility without focal segmental wall motion abnormalities.  There did not appear to be any marked dilatation of the ascending aorta or any evidence for a dissection plane.  Distal aortography was performed.  There was no evidence for renal artery stenosis.  There was, initially, mild aneurysmal dilatation followed by a moderate aneurysm that was discreet and just proximal to the ileal bifurcation in the distal aorta.  IMPRESSION: 1. Preserved LV function. 2. Evidence for infrarenal abdominal aneurysm. 3. No restenosis in the mid LAD with mild 30% LAD narrowing after the first    diagonal vessel and 30% mid distal LAD focal narrowing. 4. No restenosis at the site of tandem stenting in the right coronary artery. DD:  08/15/99 TD:  08/16/00 Job: [id number]

## 2011-05-09 NOTE — Group Therapy Note (Signed)
NAME:  [name] [name], [name] [name]                          ACCOUNT NO.:  [id number]   MEDICAL RECORD NO.:  [id number]                  PATIENT TYPE:   LOCATION:                                       FACILITY:   PHYSICIAN:  Edward L. Juanetta Gosling, M.D.             DATE OF BIRTH:   DATE OF PROCEDURE:  03/27/2004  DATE OF DISCHARGE:                                   PROGRESS NOTE   PROBLEMS:  1. Chronic obstructive pulmonary disease with exacerbation.  2. Coronary artery occlusive disease.  3. Hyperlipidemia.  4. Hypertension.   SUBJECTIVE:  Ms. Blatter says that she is feeling better.  She still  complains that the oxygen makes her feel bad.   OBJECTIVE:  Her temperature is 96.8, pulse 52, respirations 20, blood  pressure 135/91.  O2 saturation was 88% on room air, so her oxygen has been  reapplied.  Her chest is clearer than it has been with some wheezes, end-  expiratory.  Her heart is regular.  Her abdomen is soft.  Extremities showed  no edema.   ASSESSMENT:  1. She has chronic obstructive pulmonary disease with exacerbation which is     slowly improving.  2. She had coronary disease with no symptoms right now.  3. Hyperlipidemia.  She is on Crestor at home. We do not have Crestor here     and she says that the Zocor that we do have causes her to have muscle     aches so I am just going to home on treating her hyperlipidemia at this     point.   PLAN:  The plan is to try to get her up and moving around a bit more,  continue with her other treatments; and I am hopeful that she will be able  to be discharged in the next 24-48 hours.      ___________________________________________                                            Oneal Deputy Juanetta Gosling, M.D.   ELH/MEDQ  D:  03/27/2004  T:  03/28/2004  Job:  [id number]

## 2011-05-09 NOTE — Group Therapy Note (Signed)
   NAME:  [name], [name]                          ACCOUNT NO.:  [id number]   MEDICAL RECORD NO.:  [id number]                   PATIENT TYPE:  INP   LOCATION:  A322                                 FACILITY:  [facility]   PHYSICIAN:  Edward L. Juanetta Gosling, M.D.             DATE OF BIRTH:  [birth year]   DATE OF PROCEDURE:  DATE OF DISCHARGE:                                   PROGRESS NOTE   PROBLEMS:  1. Chronic obstructive pulmonary disease with exacerbation.  2. Hypertension.  3. Peripheral vascular disease.   SUBJECTIVE:  [name] [name] says that she is feeling a little better but is  still coughing a lot. She is coughing up a lot of sputum. She has no other  new complaints.   Her exam shows a temperature of 97.8, pulse 95, respirations 20, blood  pressure 121/60. Her chest shows rhonchi bilaterally, but it is a little  clearer, and she is moving air a bit better. I&O is -345.   ASSESSMENT:  She is still quit sick with all of this.   Plan is to continue with treatment. She is on maximum medical treatment, but  it has just not made a lot of progress at this point.                                               Edward L. Juanetta Gosling, M.D.    ELH/MEDQ  D:  05/08/2003  T:  05/08/2003  Job:  [id number]

## 2011-05-09 NOTE — Discharge Summary (Signed)
[place]. [facility]  Patient:    [name] [name], [name] [name] Visit Number: [id number] MRN: [id number]          Service Type: EMS Location: ED Attending Physician:  Rosalyn Charters Dictated by:   Darcella Gasman. [place], F.N.P.C. Admit Date:  01/15/2002 Discharge Date: 01/15/2002   CC:         Kari Baars, M.D., [facility], [place], [place]   Discharge Summary  DISCHARGE DIAGNOSES: 1. Tachycardia with abnormal electrocardiogram secondary to arrhythmia.    a. Insignificant coronary artery disease.    b. Mild left ventricular dysfunction.  Ejection fraction 45 to 50%. 2. Mild pulmonary hypertension. 3. Sick sinus syndrome. 4. Hypertriglyceridemia. 5. History of coronary artery disease with stents that are patent. 6. Gastroesophageal reflux disease. 7. 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  CONDITION ON DISCHARGE:  Improved.  PROCEDURES: 1. January 05, 2002 combined left heart catheterization by Dr. Lenise Herald. 2. January 05, 2002 right heart catheterization by Dr. Lenise Herald.  HISTORY OF PRESENT ILLNESS:  A 76 year old, white married female patient of Dr. Ellin Goodie with prior coronary disease including stents to the LAD and the RCA, presented to the office on January 04, 2002 for Persantine Cardiolite. EKG at rest revealed a heart rate of 122 with ST depressions.  She converted spontaneously to sinus bradycardia with resolution of her EKG changes.  She had been having chest pressure with progressive worsening and complaints of dyspnea on exertion and lower extremity edema on a daily basis and that was why she was getting the Cardiolite.  The Cardiolite was not done and the patient was admitted to [facility].  PAST MEDICAL HISTORY:  Coronary artery disease with stents to the LAD and RCA. Last catheterization in August 2001.  There was on restenosis.  She has an infrarenal abdominal aneurysm 3.0 x 3.5.  History of  hypertension. Nephrolithiasis.  PFCT.  GERD with hiatal hernia.  Hyperlipidemia. Noncompliance.  Obesity.  Migraine headache and COPD.  OUTPATIENT MEDICATIONS:  Toprol XL 50 mg one in the morning and half in the evening. Imdur 30 daily.  Nexium 40 daily.  Altace 10 mg daily.  HCTZ 25 mg daily.  ALLERGIES:  SULFA.  FAMILY HISTORY/SOCIAL HISTORY/REVIEW OF SYSTEMS:  See H&P.  PHYSICAL EXAMINATION AT DISCHARGE:  VITAL SIGNS:  Blood pressure 155/73, pulse 60, respirations 20, temperature 97.1, oxygen saturation on room air was 91%.  GENERAL:  Alert, oriented, white female in no acute distress.  HEART:  S1 and S2.  Regular rate and rhythm with a soft systolic murmur.  LUNGS:  Clear.  ABDOMEN:  Positive bowel sounds.  LABORATORY DATA:  Admission hemoglobin 12.6, hematocrit 36.4, WBC 6.1, platelets 150, MCV 83.5, neutrophils 51, lymphs 37, monos 8, eos 1, baso 2. Hemoglobin remained stable.  Blood gases:  pH 7.35 pCO2 45, pO2 77, bicarbonate 26, total CO2 27. Saturations were 95% at that time.  Prothrombin time was 12.8, INR 1, PTT 30 and she was therapeutic on heparin.  Chemistries:  Sodium 137, potassium 4.2, chloride 104, CO2 31, glucose 109, BUN 13, creatinine 1.3, calcium 9, total protein 5.8, albumin 3, AST 50, ALT 14, ALP 98, total bilirubin 0.3, magnesium 2.2.  These remained stable.  Cardiac enzymes:  CK 40, MB 0.7, troponin 0.01.  They remained in that range times three.  Initial lipid panel was done after patient had eaten.  Total cholesterol 261, triglycerides 1075, HDL 28 and LDL not calculated.  On fasting the next day, total  cholesterol 282, triglycerides 580, HDL 42 and LDL was not calculated. The patient was put on Zocor.  T4 was 4.7, TSH 1.798, and T3 95.5.  UA:  Small leukocyte esterase, many epithelial, bacteria few, WBCs 11 to 20.  HOSPITAL COURSE:  The patient was admitted on January 04, 2002 from the office by Dr. Jenne Campus for abnormal EKG and  tachycardia that resolved once back in sinus bradycardia.  She had been having increasing chest pain and shortness of breath.  She was brought into the hospital, put on IV heparin and scheduled for left and right heart catheterization on January 05, 2002 which she underwent. She was having episodic sinus tachycardia with heart rates up to 150 and shortness of breath.  Otherwise she would be in sinus bradycardia. Cardiac catheterization with results as previously stated.  Minimal coronary disease 15 to 40%.  Her stents were patent.  Right heart catheterization with mild pulmonary hypertension. Tolerated the procedure well.  She was able to ambulate with minimal difficulties and by January 07, 2002 she was still either tachycardia or bradycardia.  Occasionally heart rate would be in the 70s.  She still felt somewhat tired and weak.  She was able to ambulate without problems and was felt ready to go home on the afternoon of January 07, 2002.  She was discharged by Dr. Domingo Sep.  She was to follow up with Dr. Tresa Endo.  DISCHARGE MEDICATIONS:  1. Betapace AF 40 mg b.i.d.  2. Tricor 67 mg q.d.  3. Imdur 30 mg q.d.  4. Nexium 40 mg q.d.  5. Altace 10 mg q.d.  6. HCTZ 25 mg q.d.  7. Zocor 40 mg at h.s.  8. Coated aspirin 325 mg q.d.  9. Nitroglycerin 0.4 p.r.n. chest pain. 10. Stop Toprol.  ACTIVITY:  As tolerated.  DIET: Low fat, low salt, low cholesterol diet.  SPECIAL INSTRUCTIONS:  May shower.  Call with any problems or questions.  FOLLOW-UP:  With Dr. Tresa Endo on January 18, 2002 at 3:15. Dictated by:   Darcella Gasman In[place], F.N.P.C. Attending Physician:  Rosalyn Charters DD:  01/30/02 TD:  01/31/02 Job: [id number]

## 2011-05-09 NOTE — Discharge Summary (Signed)
[place]. [facility]  Patient:    [place], [name] Visit Number: [id number] MRN: [id number]          Service Type: MED Location: [phone] Attending Physician:  Herminio Heads Dictated by:   Abelino Derrick, P.A.C. Admit Date:  11/16/2001 Discharge Date: 11/17/2001                             Discharge Summary  ADDENDUM  Follow-up on Ms. Martins chest x-ray showed mild COPD, no active disease. Dictated by:   Abelino Derrick, P.A.C. Attending Physician:  Herminio Heads DD:  11/17/01 TD:  11/18/01 Job: [id number]

## 2011-05-09 NOTE — Cardiovascular Report (Signed)
[place]. [facility]  Patient:    [name] [name], [name] [name]                     MRN: [id number] Proc. Date: 03/26/00 Adm. Date:  [id number] Attending:  Alric Quan CC:         Kari Baars, M.D.             Veneda Melter, M.D. [facility]             Dr. Threasa Beards, [place], [place]             [facility]                        Cardiac Catheterization  PROCEDURE PERFORMED: 1. Primary stent placement, mid left anterior descending. 2. Percutaneous transluminal coronary angioplasty with stent placement x 2 in the    mid and proximal right coronary artery.  INDICATION:  [name] is a 76 year old woman who was admitted to [facility] with unstable angina.  Cardiac catheterization by Dr. Threasa Beards revealed the presence of a 75% stenosis in the mid-LAD.  The right coronary artery had a very long complex stenosis which was 80% at its tightest portion in the proximal vessel and 75% in the mid-vessel.  She was referred for percutaneous intervention.  PROCEDURAL NOTE:  A 7-French sheath was placed in the left femoral artery.  We initially treated the LAD.  Heparin and Integrilin were administered per protocol. We used a 7-French JL4 guiding catheter and a BMW wire.  Primary stent placement was performed with a 2.75 x 13.0-mm Tetra stent deployed in the mid LAD at 11 atmospheres.  Angiographic images revealed an excellent result with 0% residual  stenosis and TIMI-3 flow.  We then turned our attention to the right coronary artery.  We used a 7-French R4 guiding catheter with the sideholes and a BMW wire.  Pre-dilatation in the lesion was performed with a 3.5 x 20.0-mm Quantum Ranger balloon.  This was positioned  across the stenosis in the mid-vessel and inflated to 8 atmospheres.  The balloon was then pulled back across the more proximal stenosis and inflated again to 8 atmospheres.  We then placed a 3.5 x 38.0-mm Tetra stent to cover the  more distal aspect of the diseased area.  This stent was deployed at 12 atmospheres.  We then post-dilated this stent with a 3.5 x 20.0-mm Quantum Ranger which was inflated o 18 atmospheres at both the distal and proximal margins of this stent.  There was still residual plaque proximal to the stent.  After post-dilatation in the stent, there was a dissection that developed in this plaque in the proximal vessel. We covered this with a 3.5 x 13.0-mm Tetra stent which was deployed initially at 16 atmospheres.  A second inflation with the stent balloon was performed to 20 atmospheres.  Angiographic images revealed an excellent result with 0% residual stenosis within both stents and TIMI-3 flow into the distal vessel.  COMPLICATIONS:  None.  RESULTS: 1. Successful primary stent placement in the mid left anterior descending, reducing    a 75% stenosis to 0% residual with TIMI-3 flow. 2. Successful percutaneous transluminal coronary angioplasty with stent placement    x 2 in the proximal and mid right coronary artery.  A proximal 80% stenosis as    reduced to 0% residual and a mid 75% stenosis was reduced to 0% residual with    TIMI-3  flow.  PLAN:  Integrilin will be continued for an additional 18 to 20 hours.  Plavix will be administered for four weeks. DD:  03/26/00 TD:  03/27/00 Job: [id number]

## 2011-05-09 NOTE — Group Therapy Note (Signed)
NAME:  [place], [name]                          ACCOUNT NO.:  [id number]   MEDICAL RECORD NO.:  [id number]                   PATIENT TYPE:  INP   LOCATION:  A226                                 FACILITY:  [facility]   PHYSICIAN:  Edward L. Juanetta Gosling, M.D.             DATE OF BIRTH:  12/03/1935   DATE OF PROCEDURE:  DATE OF DISCHARGE:  03/28/2004                                   PROGRESS NOTE   PROBLEM:  Chronic obstructive pulmonary disease with exacerbation,  hypertension, coronary disease, hyperlipidemia.   SUBJECTIVE:  Ms.  Faraci says she is feeling okay and has no complaints  today.  She wants to go home.   OBJECTIVE:  Physical examination today shows that her chest is pretty clear.  Her heart is regular.  Abdomen is soft.  Extremities showed no edema. C&S is  grossly intact.   ASSESSMENT/PLAN:  She is feeling so much better that I think it is okay for  her to be discharged.  We will plan to go ahead with discharge.  Please see  discharge summary for details.      ___________________________________________                                            Oneal Deputy Juanetta Gosling, M.D.   ELH/MEDQ  D:  03/28/2004  T:  03/29/2004  Job:  [id number]

## 2011-05-09 NOTE — Discharge Summary (Signed)
NAME:  [name] [name], [name] [name]                          ACCOUNT NO.:  [id number]   MEDICAL RECORD NO.:  [id number]                   PATIENT TYPE:  INP   LOCATION:  A322                                 FACILITY:  [facility]   PHYSICIAN:  Edward L. Juanetta Gosling, M.D.             DATE OF BIRTH:  [birth year]   DATE OF ADMISSION:  05/04/2003  DATE OF DISCHARGE:  05/10/2003                                 DISCHARGE SUMMARY   FINAL DISCHARGE DIAGNOSES:  1. Chronic obstructive pulmonary disease with exacerbation.  2. Hypertension.  3. Gastroesophageal reflux disease.  4. Chronic low-back pain.  5. Chronic headaches.  6. Peripheral vascular disease.   SUBJECTIVE:  The patient is a patient with known COPD who has come in with  increasing shortness of breath for about the last three days.  She had not  had fever or chills.  No chest pain.  No nausea or vomiting.  She has been  bringing up some clear sputum but she is more short of breath then usual.  She has a very long history of COPD.  She has had coronary disease,  peripheral vascular disease, chronic low-back pain, gastroesophageal reflux  disease and hypertension.   PHYSICAL EXAMINATION:  VITAL SIGNS:  Shows her blood pressure is 151/97,  pulse 89, respirations 24, O2 sat 92%.  HEENT:  Showed her pupils react to light and accomodation.  Nose and throat  are clear.  NECK:  Supple without masses.  CHEST:  Shows rhonchi bilaterally.  HEART:  Regular without gallop.  ABDOMEN:  Soft.   DIAGNOSTIC STUDIES:  Chest x-ray showed she did not have a pneumonia but did  have evidence of COPD.   Her white blood count was 5,600, hemoglobin was 14.1.   HOSPITAL COURSE:  She was admitted, started on intravenous antibiotics,  intravenous steroids, inhaled bronchodilators and improved.  It was several  days before she became well enough to be discharged home.   DISCHARGE MEDICATIONS:  1. She was discharged home on Atrovent and albuterol and a nebulizer four  times a day.  2. Humibid LA two b.i.d.  3. Maxzide 25 one daily.  4. Norvasc 5 mg daily.  5. Nexium 40 mg daily.  6. Cefzil 250 mg b.i.d.  7. Prednisone 40 mg x 3 days, 30 x 3 days, 20 x 3 days, 10 x 3 days.  8. Codiclear DH 5 cc q.4h. p.r.n. cough.  9. Xanax 0.5 mg q.i.d.  10.      Vicodin one q.i.d. p.r.n. pain.   FOLLOW UP:  She is going to followup in my office in about a month.  She is  going to have home health care including nursing visits as well as having  personal care services at home.  Edward L. Juanetta Gosling, M.D.    ELH/MEDQ  D:  05/10/2003  T:  05/10/2003  Job:  [id number]

## 2011-05-09 NOTE — Group Therapy Note (Signed)
   NAME:  [place], [name]                          ACCOUNT NO.:  [id number]   MEDICAL RECORD NO.:  [id number]                   PATIENT TYPE:  INP   LOCATION:  A322                                 FACILITY:  [facility]   PHYSICIAN:  Edward L. Juanetta Gosling, M.D.             DATE OF BIRTH:  [birth year]   DATE OF PROCEDURE:  05/05/2003  DATE OF DISCHARGE:                                   PROGRESS NOTE   PROBLEM:  1. Chronic obstructive pulmonary disease with exacerbation.  2. Coronary disease.  3. Peripheral vascular disease.  4. Hypertension.   SUBJECTIVE:  The patient says that she is doing okay except she is still  complaining of chronic headaches; this has been a complaint for years.  She  is still having some back pain as well.  She says she is breathing a little  bit better than yesterday but still having some problems.   OBJECTIVE:  Her physical exam shows her temperature is 96.8, pulse 81,  respirations 20, blood pressure 127/71, O2 saturation 95% on 2 liters.  Her  chest shows some rhonchi but no wheezes today.  I&O is +425.   ASSESSMENT:  She has COPD with exacerbation which appears to be improving.  Coronary disease - she has really had no symptoms related to that during  this hospitalization, no chest pain at all.  She has chronic headaches which  are still present and has peripheral vascular disease - her feet are  actually warm with slightly decreased pulses.   PLAN:  Continue with current treatments, try to get her up and moving around  a little bit today, and hopefully she will be able to be discharge fairly  soon.  She has made a significant improvement.                                               Edward L. Juanetta Gosling, M.D.    ELH/MEDQ  D:  05/05/2003  T:  05/05/2003  Job:  [id number]

## 2011-05-09 NOTE — H&P (Signed)
NAME:  [name] [name], [name] [name]                          ACCOUNT NO.:  [id number]   MEDICAL RECORD NO.:  [id number]                   PATIENT TYPE:  INP   LOCATION:  A226                                 FACILITY:  [facility]   PHYSICIAN:  Mila Homer. Sudie Bailey, M.D.           DATE OF BIRTH:  December 12, 1935   DATE OF ADMISSION:  03/24/2004  DATE OF DISCHARGE:                                HISTORY & PHYSICAL   HISTORY OF PRESENT ILLNESS:  This 76 year old woman began to develop  shortness of breath several days prior to the admission.  She has had this  before with the last flare of her chronic obstructive pulmonary disease  treated in this hospital in May of 2004.  She really had no other symptoms.  She does have a history of coronary artery disease with two-vessel bypass  surgery, also hypertension, kidney stones, myocardial infarction,  bronchitis, chronic back pain, chronic headache, and gastroesophageal reflux  disease.  She also has peripheral vascular disease.   HOME MEDICATIONS:  Still not available for review at the time I admitted  her.   PHYSICAL EXAMINATION:  GENERAL:  A pleasant, 76 year old woman who came to  the emergency room.  VITAL SIGNS:  Temperature 97.7 with the blood pressure 152/105.  Pulse 111,  respiratory rate 28.  Recheck BP was 83/52 and then 94/53, 81/54, 101/57.  O2 saturations were 98%.  LUNGS:  At the time of my exam, she had decreased breath sounds at the  lungs, but no intercostal retractions or use of accessory muscles of  respiration.  HEART:  Regular rhythm with a rate of 80.  ABDOMEN:  Soft without hepatosplenomegaly, mass or tenderness.  There is no  edema of the ankles.  HEENT:  Mucous membranes appeared normal.   LABORATORY DATA:  Her admission white cell count was 6500, with 70%  neutrophils, 18 lymphocytes.  The rest of her CBC was normal as was her INR,  PTT and cardiac enzymes.  BNP was 74 and MET-7 showed glucose of 125.  Blood  gasses on 36% Vent-mask   showed pO2 of 98, pCO2 of 45, pH 7.33, and her O2  saturations were 97%.   ASSESSMENT:  1. Chronic obstructive pulmonary disease exacerbation.  2. Coronary artery disease status post myocardial infarction and two-vessel     bypass by history.  3. Essential hypertension.  4. Chronic low-back pain.  5. Gastroesophageal reflux disease.  6. Peripheral vascular disease.  7. Chronic headaches.   PLAN OF TREATMENT IN HOSPITAL:  1. Solu-Medrol 125 mg IV q.6 with Rocephin 1 gram IV q.24 hours and     Zithromax 5 mg immediately p.o. and then 250 mg p.o. daily.  2. O2 at four liters at present.  3. Albuterol and Atrovent nebulizer treatments q.4h. while awake with     Mucomyst 800 mg with each nebulization treatment.  4. She will be on Xanax 0.5 q.i.d. p.r.n. anxiety.  ___________________________________________                                         Mila Homer Sudie Bailey, M.D.   SDK/MEDQ  D:  03/24/2004  T:  03/24/2004  Job:  [id number]   cc:   Ramon Dredge L. Juanetta Gosling, M.D.  [address]  [place]  [place] [address]  Fax: [phone]

## 2011-05-09 NOTE — Discharge Summary (Signed)
. [facility]  Patient:    [name], [name]                     MRN: [id number] Adm. Date:  [id number] Disc. Date: 03/31/00 Attending:  Alric Quan Dictator:   Lavella Hammock, P.A. CC:         Veneda Melter, M.D. [facility]             Dr. Kari Baars, [address], [place], [place]. [address]             Dr. Kathryne Sharper in [place]                  Referring Physician Discharge [facility]  DATE OF BIRTH:  [birth year]  PROCEDURES: 1. Cardiac catheterization. 2. Coronary arteriogram. 3. Left ventriculogram. 4. Percutaneous transluminal coronary angioplasty and stent of one vessel.  HOSPITAL COURSE:  [name] is a 76 year old female who had chest pain on the  Monday before admission and went to [facility], where she got a cardiac catheterization and it showed an 80% mid LAD with a 70-80% RCA and normal left ventricular function.  She was transferred to [facility] for further evaluation.  She was placed in a REVERSAL study and scheduled for coronary intervention.  She had intervention on March 26, 2000 where she had a stent to a mid LAD, reducing the stenosis from 75% to 0, and PTCA and stent x 2 to the mid and  proximal RCA, which reduced the stenosis from 80% and 75%, respectively, to 0. She was placed on Integrilin and heparin.  There was TIMI 3 flow post procedure. She was also started on Plavix.  On March 26, 2000 at approximately 1:30 a.m. the patient began complaining of nausea and vomiting and also was hypotensive.  Her  blood pressure was in the 70s.  She was started on dopamine.  Her EKG remained within normal limits but her abdomen was tender and she was transferred to CCU.  She had a CT which showed a large mesenteric/retroperitoneal bleed contained to the peritoneum.  She also had an ileus.  She had a blood pressure palpated at 48 by  2 a.m. and was unresponsive, so she was intubated at 3:30 and sedated so  she would tolerate the tube.  Tube placement was confirmed and her oxygen saturation was good.  By March 27, 2000 she had stabilized and they were able to wean her from he ventilator and extubate her.  She also had blood pressure that was doing much better and she was able to be weaned off the dopamine.  For the retroperitoneal  bleed, she was transfused two units of packed cells and one unit of platelets, nd her hemoglobin and hematocrit stabilized.  By March 28, 2000 her ileus had resolved and her diet was advanced.  By March 30, 2000 her activity was advanced and she as seen by cardiac rehab.  She had some short runs of SVT overnight but her potassium was low at 3.1 and she was started on Cardizem IV and supplemented.  Her heart ate came back down to within normal limits and she was in sinus rhythm, so the Cardizem was changed to p.o.  Cardiac rehab continued to follow her and she made some good progress.  By March 31, 2000 her hemoglobin and hematocrit were stable, her potassium was within normal limits, and she was ambulating well with cardiac rehab. She was started on a beta  blocker and the Cardizem was discontinued, and she had no further runs of SVT.  She was considered stable for discharge on March 31, 2000  p.m.  LABORATORY VALUES:  Hemoglobin 10.5, hematocrit 30.9, wbc 7.7, platelets 210. Hemoglobin 12.6, hematocrit 37.1, wbc 14.2, platelets 172.  Sodium 137, potassium 3.6, chloride 106, CO2 27, BUN 13, creatinine 1.2, glucose 104.  Total cholesterol 222, triglycerides 508, HDL 38, LDL not measured.  TSH 4.304.  DISCHARGE CONDITION:  Improved.  CONSULTS:  None.  DISCHARGE DIAGNOSES: 1. Two-vessel coronary artery disease, status post percutaneous transluminal    coronary angioplasty and stent to the LAD and RCA, this admission. 2. Hyperlipidemia. 3. Status post retroperitoneal bleed requiring transfusion. 4. Hypotension and decreased loss of consciousness  secondary to #3, requiring    intubation and pressors. 5. Hypertension. 6. History of tobacco use. 7. Patient being followed by research for the REVERSAL study. 8. Leukocytosis with a low grade temperature with a white count of 14,000;    urinalysis and chest x-ray okay, felt secondary to retroperitoneal bleed. 9. Ileus, secondary to retroperitoneal bleed, resolved.  DISCHARGE INSTRUCTIONS:  ACTIVITY:  Her activity level is to be as tolerated.  DIET:  She is to stick to a low fat diet.  SPECIAL INSTRUCTIONS:  She is to call the office for bleeding, swelling, or drainage at the catheterization site.  She is not to take Norvasc for now.  FOLLOW-UP:  He is to follow up with Dr. Juanetta Gosling and Dr. Kathryne Sharper and call both for an appointment.  DISCHARGE MEDICATIONS: 1. Atenolol 25 mg q.d. 2. Aspirin 325 mg q.d. 3. Altace 2.5 mg b.i.d. 4. Plavix 75 mg q.d. 5. Nitroglycerin 0.4 mg sublingual p.r.n. 6. Xanax p.r.n. 7. REVERSAL study medication. DD:  03/31/00 TD:  03/31/00 Job: [id number]

## 2011-05-09 NOTE — Discharge Summary (Signed)
NAME:  [place], [name]                          ACCOUNT NO.:  [id number]   MEDICAL RECORD NO.:  [id number]                   PATIENT TYPE:  INP   LOCATION:  A226                                 FACILITY:  [facility]   PHYSICIAN:  Edward L. Juanetta Gosling, M.D.             DATE OF BIRTH:  26-May-1935   DATE OF ADMISSION:  03/24/2004  DATE OF DISCHARGE:  03/28/2004                                 DISCHARGE SUMMARY   FINAL DISCHARGE DIAGNOSES:  1. Chronic obstructive pulmonary disease with exacerbation.  2. Hypertension.  3. Coronary artery occlusive disease.  4. Hyperlipidemia.   HISTORY:  Ms. Rochefort is a 76 year old who has had a long known history of  chronic obstructive pulmonary disease who came to the emergency room with  increasing shortness of breath.  She was noted to be hypoxemic and quite  congested, and because of those findings, was admitted.   PHYSICAL EXAMINATION:  VITAL SIGNS:  Temperature 97.7, blood pressure  152/105, pulse 111, respirations 28, O2 saturations was 98% on four liters.  CHEST:  Showed decreased breath sounds with some rhonchi.  HEART:  Regular.  ABDOMEN:  Soft.   LABORATORY DATA:  White count 6500, BNP was 74.  BMET:  Glucose was 125,  otherwise essentially negative.  Blood gas on 36% mask showed pO2 of 98,  pCO2 45, pH 7.33.   HOSPITAL COURSE:  She was started on intravenous antibiotics, inhaled  bronchodilators, nebulizer treatment and was improved over the next several  days.  By the time of discharge, he was much improved, and was discharged  home.   DISCHARGE MEDICATIONS:  1. Ceftin 250 mg b.i.d. x7 days.  2. Prednisone 40 mg x2 days, 30 x2 days, 20 x2 days, 10 x2 days, and then     stop.  3. Codiclear DH, 5 cc q.4h. p.r.n. cough.  4. Altace 10 mg a day.  5. Betapace 40 mg b.i.d.  6. Albuterol and Atrovent nebulizer treatments on a p.r.n. basis.  7. Protonix 40 mg daily.  8. Tricor 160 mg daily.  9. Xanax 0.5 mg q.i.d. p.r.n. nerves.  10.       Vicodin one to two q.i.d. p.r.n. pain.  11.      O2 at two liters.     ___________________________________________                                         Oneal Deputy Juanetta Gosling, M.D.   ELH/MEDQ  D:  03/28/2004  T:  03/29/2004  Job:  [id number]

## 2011-05-09 NOTE — Cardiovascular Report (Signed)
[place]  Patient:    [name], [name] Visit Number: [id number] MRN: [id number]          Service Type: MED Location: [phone] Attending Physician:  Virgina Evener Dictated by:   Lenise Herald, M.D. Proc. Date: 01/05/02 Admit Date:  01/04/2002   CC:         [name] [name], M.D.   Cardiac Catheterization  PROCEDURES: 1. Right heart catheterization. 2. Left heart catheterization. 3. Coronary angiography. 4. Left ventriculogram.  ATTENDING:  Lenise Herald, M.D.  COMPLICATIONS:  None.  INDICATIONS:  [name] [name] is a 76 year old female patient of Dr. Daphene Jaeger with a history of PTCA and stenting of her LAD in 2001 with PTCA and stenting of the RCA.  Patient, on her repeat catheterization in 2001 by Dr. Daphene Jaeger, revealing patent stents.  The patient recently complained of increasing dyspnea on exertion and subsequently was referred for a Persantine Cardiolite in our office; however, the patient was noted to have diffuse inferolateral ST segment depression in the office yesterday and the Cardiolite scan was cancelled.  The patient is now referred for cardiac catheterization to redefine her coronary anatomy.  DESCRIPTION OF PROCEDURE:  After giving informed written consent, the patient was brought to the cardiac catheterization lab where her right and left groins were shaved, prepped, and draped in the usual sterile fashion.  ECG monitoring was established.  Using the modified Seldinger technique, a #6 French arterial sheath and #8 French venous sheath were inserted in the right femoral artery and vein.  The #6 [place] diagnostic catheters were then used to perform diagnostic angiography.  This reveals a large left main with no significant disease.  The LAD is a medium sized vessel which coursed to the apex after one diagonal branch.  The LAD is noted to have mild 50% narrowing at the takeoff of the first diagonal.  There is a  stent noted in the early mid part of the LAD which appears widely patent.  FINDINGS: 1. The first diagonal was a medium sized vessel which bifurcated distally and    has no significant disease. 2. The left circumflex was a medium sized vessel which courses in the AV    groove and gave rise to one large bifurcating obtuse marginal.  There is no    significant disease in the AV groove.  The first OM is a large vessel which    bifurcates in the mid segment and has no significant disease. 3. The right coronary artery is a large vessel which is dominant and gives    rise to both a PDA as well as a posterolateral branch.  There is a long    overlapping stent noted in the proximal and mid RCA which appears to have    both 40% distal in-stent restenosis.  The remainder of the RCA has no    significant disease.  PDA and posterolateral branch have no significant    disease.  A left ventriculogram reveals a mildly depressed EF of 45-50% with mild global hypokinesis.  Abdominal aortogram reveals moderate diffuse distal aortic disease with a moderate sized infrarenal abdominal aneurysm noted.  HEMODYNAMICS:  Right atrial pressure of 11.  RV 40/12.  PA 39/15.  Pulmonary capillary wedge pressure 16.  Systemic arterial pressure 174/86.  LV systemic pressure 169/12.  LVEDP of 16.  Cardiac output 3.8, cardiac index 2.0.  PA saturation 59%.  AO saturation 95%.  CONCLUSIONS: 1. No significant coronary artery disease.  2. Mildly depressed left ventricular systolic function.  Wall motion    abnormalities noted above. 3. Moderate disease of the distal abdominal aorta with a moderate sized    infrarenal aneurysm noted. 4. Elevated pulmonary capillary wedge pressure with mild pulmonary    hypertension. 5. Cardiac output 3.8, cardiac index 2.0. 6. Pulmonary artery saturation 59%, arterial saturation 95%. Dictated by:   Lenise Herald, M.D. Attending Physician:  Virgina Evener DD:  01/05/02 TD:   01/05/02 Job: [id number]

## 2011-05-09 NOTE — Group Therapy Note (Signed)
NAME:  Stem[place], [name]                          ACCOUNT NO.:  [id number]   MEDICAL RECORD NO.:  [id number]                   PATIENT TYPE:  INP   LOCATION:  A226                                 FACILITY:  [facility]   PHYSICIAN:  Edward L. Juanetta Gosling, M.D.             DATE OF BIRTH:  [birth year]   DATE OF PROCEDURE:  DATE OF DISCHARGE:                                   PROGRESS NOTE   PROBLEM:  1. Chronic obstructive pulmonary disease.  2. Hypertension.  3. Coronary disease.   SUBJECTIVE:  Ms. Iverson says she is feeling pretty well.  Her heart rate  gets up when she goes to the bathroom but otherwise she is doing okay.  She  is complaining of the O2 and wants to take it off.  Otherwise, she has had  no chest pain.  She is not having any other new complaints.  She is coughing  still.  She is coughing up a little bit of sputum.   OBJECTIVE:  On her exam today, temperature 96.5, pulse 61, respirations 20,  blood pressure 137/70.  O2 saturations 94% on four liters.  Chest is with  some rhonchi.  Heart is regular.   ASSESSMENT:  She seems to be doing better.   PLAN:  I am going to go ahead and have her take the O2 off and check an O2  saturation on room air.  I am not sure that she is actually ready for this  at this point.  I am going to have her go ahead with continuing all of her  other treatments, try to get her up into a chair a bit more today, and  continue with other medicines and treatments.      ___________________________________________                                            Oneal Deputy. Juanetta Gosling, M.D.   ELH/MEDQ  D:  03/26/2004  T:  03/27/2004  Job:  [id number]

## 2011-05-09 NOTE — Group Therapy Note (Signed)
   NAME:  Cuccaro, [name]                          ACCOUNT NO.:  [id number]   MEDICAL RECORD NO.:  [id number]                    PATIENT TYPE:   LOCATION:                                       FACILITY:   PHYSICIAN:  Edward L. Juanetta Gosling, M.D.             DATE OF BIRTH:   DATE OF PROCEDURE:  05/07/2003  DATE OF DISCHARGE:                                   PROGRESS NOTE   PROBLEMS:  1. Chronic obstructive pulmonary disease with exacerbation.  2. Peripheral vascular disease with aortic stent.  3. Hypertension.  4. Chronic headaches.  5. Chronic back pain.   SUBJECTIVE:  This patient says that she is doing okay, but still coughing a  lot. She is not able to cough much up, but the sputum that she produces is  yellow and thick.  Otherwise, she has no complaints.   OBJECTIVE:  Her physical exam today shows that her temperature is 97.2;  pulse 86; respirations 18; blood pressure 129/65.  O2 saturation 89% on room  air.  Chest shows rhonchi bilaterally, no wheezes today.  Heart is regular  without gallop.  I&O plus 360.   ASSESSMENT:  She is continuing to have problems with the exacerbation of  chronic obstructive pulmonary disease.  Her other problems seem to be  stable.   PLAN:  The plan now is to try to get her up and moving around and see if it  makes any difference in her ability to bring up sputum.  She already has  Humibid ordered.  Otherwise will continue treatments. She already is on  steroids, antibiotics, inhaled bronchodilators and antimucolytic.                                               Edward L. Juanetta Gosling, M.D.    ELH/MEDQ  D:  05/07/2003  T:  05/07/2003  Job:  [id number]

## 2011-05-09 NOTE — H&P (Signed)
NAME:  [name], [name]                          ACCOUNT NO.:  [id number]   MEDICAL RECORD NO.:  [id number]                   PATIENT TYPE:  INP   LOCATION:  A322                                 FACILITY:  [facility]   PHYSICIAN:  Edward L. Juanetta Gosling, M.D.             DATE OF BIRTH:  [birth year]   DATE OF ADMISSION:  05/04/2003  DATE OF DISCHARGE:                                HISTORY & PHYSICAL   REASON FOR ADMISSION:  COPD with exacerbation.   SUBJECTIVE:  [name] [name] has come in with shortness of breath.  She states  she has had it for about 3 days.  She has been coughing some but not  bringing anything up.  She has no other new complaints.  She has not had any  fever or chills.  She has had no chest pain.  No nausea or vomiting.  The  sputum that she is bringing up is fairly clear with some discoloration but  not a great deal.  She has a very long history of COPD and has had a history  of coronary disease, chronic low back pain, reflux, and hypertension.   CURRENT MEDICATIONS:  Unfortunately, she does not know the doses of most of  these:  Altace, Nexium, Norvasc, Xanax, and Maxzide 25.   FAMILY HISTORY:  Positive for COPD and for cardiac disease, although the  exact extent of that is unknown.   REVIEW OF SYSTEMS:  She has not gained or lost any weight, and she has been  feeling fairly well up until 2 to 3 days ago, as mentioned.   PHYSICAL EXAMINATION:  VITAL SIGNS:  In the emergency room, she was noted to  have a blood pressure 151/97, pulse 89, respirations 24, O2 sat 92%.  HEENT:  Her pupils are equal, round, and reactive to light and  accommodation.  Mucous membranes are slightly dry.  Nose and throat are  clear.  NECK:  Supple, without masses, bruits or jugular venous distention.  CHEST:  Rhonchi and some wheezing bilaterally.  She sounds tight.  HEART:  Regular, but her heart sounds are largely obscured by airway noise.  ABDOMEN:  Soft.  No masses are felt.  EXTREMITIES:  No  edema.  CENTRAL NERVOUS SYSTEM:  She is anxious but otherwise okay.   LABORATORY DATA:  Chest x-ray does not show a pneumonia, and her laboratory  work otherwise shows blood gases done during a treatment so is not accurate.  Her troponin level was 0.06.  CK and MB are normal.  Liver function normal.  BMET normal.  CBC showed white count 5600, hemoglobin 14.1.    ASSESSMENT:  She has chronic obstructive pulmonary disease with an  exacerbation, probably a bronchitis.   PLAN:  To treat her with intravenous steroids, inhaled bronchodilators,  antibiotics, and follow.  Edward L. Juanetta Gosling, M.D.    ELH/MEDQ  D:  05/04/2003  T:  05/04/2003  Job:  [id number]

## 2011-05-09 NOTE — Discharge Summary (Signed)
[place]  Patient:    [name], [name]                     MRN: [id number] Adm. Date:  [id number] Disc. Date: [id number] Attending:  Berry, [name] [place] Dictator:   [name] [name] [name] [name], P.A.C. CC:         [name] [name], M.D. Primary care [place]   Discharge Summary  ADMISSION DIAGNOSES: 1. Unstable angina. 2. Abdominal aortic aneurysm. 3. History of coronary artery disease.    a. Status post cardiac catheterization on March 26, 2000, with percutaneous       transluminal coronary angioplasty and stent x 2 to the mid and proximal       right coronary artery, and percutaneous transluminal coronary       angioplasty stent to the mid left anterior descending artery. 4. Status post diagnostic cardiac catheterization on March 24, 2000, with an    80% mid left anterior descending artery stenosis and 70 to 80% mid right    coronary artery stenosis and normal left ventricular function. 5. Hypertension. 6. History of kidney stones. 7. History of migraine headaches.  DISCHARGE DIAGNOSES: 1. Unstable angina. 2. Abdominal aortic aneurysm. 3. History of coronary artery disease.    a. Status post cardiac catheterization on March 26, 2000, with percutaneous       transluminal coronary angioplasty and stent x 2 to the mid and proximal       right coronary artery, and percutaneous transluminal coronary       angioplasty stent to the mid left anterior descending artery. 4. Status post diagnostic cardiac catheterization on March 24, 2000, with an    80% mid left anterior descending artery stenosis and 70 to 80% mid right    coronary artery stenosis and normal left ventricular function. 5. Hypertension. 6. History of kidney stones. 7. History of migraine headaches. 8. Status post cardiac catheterization on August 14, 2000, by Dr. Nicki [name], revealing nonobstructive coronary artery disease.  HISTORY OF PRESENT ILLNESS:  [name] [name] is a 76 year old white  female with a history of CAD, status post PTCA in April 2001, history of hypertension, unknown lipid status, who presents as a transfer from [facility] to [facility] on August 13, 2000, with complaints of shortness of breath, chest pain, and back pain.  She states that she has had mild symptoms off and on for several weeks, but that the symptoms have increased on the previous day.  She states that on the previous day at approximately 8 a.m. she had just sprayed some ant killer when she became very weak and dizzy.  She had a dull achy pain in the left chest. There was radiation down the left arm.  She also had pain in her back.  She did have shortness of breath and diaphoresis, but no nausea or vomiting.  She then went and laid down.  She did not wake up until about noon.  She then continued to feel extremely weak and fatigued.  Although she laid around and did not do much activity.  She is usually on the go, working outside, and very active.  However, that day she was extremely fatigued.  On the morning of admission after she got up and made coffee, she developed chest pain.  It was a quick shooting chest pain.  She called her daughter to take her to [facility] ER.  She was given nitroglycerin which seemed to  help relieve the back pain and chest pain.  She had no recurrence of chest pain since it was relieved.  She states that prior to yesterday her only chest pain had been very brief shooting chest pains.  On exam at that time her heart rate was in the 80s, blood pressure 160/78. Exam was essentially benign.  EKG from [name] revealed sinus bradycardia, 43 beats per minute, T-wave inversion in 3 only, nonspecific ST-T changes throughout.  Laboratories from [facility] revealed the initial set of enzymes were negative.  At that time, it was felt that [name] [name] was experiencing unstable angina. Initial set of enzymes were negative.  EKG was without significant  change.  At that time she was placed on aspirin, Plavix, IV heparin, IV nitroglycerin, as well as her home medications.  We had planned to check serial enzymes to rule out MI.  It was felt that she would need repeat cardiac catheterization.  HOSPITAL COURSE:  On August 14, 2000, [name] [name] underwent cardiac catheterization by Dr. Nicki [name].  Please see his dictated cardiac catheterization report for further details.  She was found to have patent stents in her RCA as well as LAD.  She had no other significant obstructive CAD.  She then had an abdominal aortic aneurysm on abdominal angiography.  She was planned for medical therapy at that time.  On the evening of August 14, 2000, [name] [name] had some SVT during the night with rates up to 150.  Therefore, on August 15, 2000, Toprol XL 25 mg was added to her current medicines, and we planned to monitor her rhythm.  On August 16, 2000, her blood pressure was up at 154/100.  As well, she had continued to have PSVT during the night.  Therefore, at that time we increased her Toprol to 50 mg a day.  As well, her lipid profile was back with a triglyceride of 571, and Tricor 200 mg q.h.s. was started at that time.  As well, on that day we changed her Norvasc to Cardizem CD 180 mg to try to better control her heart rate in addition to blood pressure.  On August 17, 2000, [name] [name] had some tachy-brady rhythm.  With her Cardizem and Toprol she had heart rates drop down into the 30s.  Therefore, her Cardizem had been stopped.  On August 18, 2000, [name] [name] had no further significant tachy rhythms or brady rhythms.  Her heart rate is now 59, blood pressure 145/78.  It is felt that we can adjust her medications further, and that she is stable for discharge home at this time.  She recently had an abdominal CT for her AAA just this past week.  This can be followed as an outpatient with abdominal ultrasound.  HOSPITAL CONSULTS:  None.  HOSPITAL  PROCEDURES:  Cardiac catheterization on August 14, 2000, by Dr.  Nicki [name], revealing no restenosis of her previously angioplastied sites. No other obstructive CAD.  We planned medical therapy at that time.  HOSPITAL LABORATORIES:  CBC normal with WBC 7.6, hemoglobin 13.9, hematocrit 40.7, platelets 152.  Metabolic profile normal with sodium 139, potassium 4.0, glucose 129, BUN 21, creatinine 1.2.  Cardiac enzymes negative x 3 with CKs of 61, 52, and 63.  CK-MBs were 1.0, 0.8, and 0.8.  Troponin is less 0.03 x 3. Lipid profile revealed total cholesterol of 295, triglycerides 571, HDL 38, LDL was not calculable.  Urinalysis is all negative.  Chest x-ray, abdominal ultrasound on August 13, 2000,  revealed borderline dilatation of the abdominal aorta with extensive atheromatous changes.  The aorta measures 3.0 cm in maximum diameter.  Somewhat small left kidney containing a 1.8 cm parapelvic cyst or a focally dilated collecting system in the upper pole.  Chest CT on August 13, 2000, reveals no evidence of pulmonary embolism or other significant abnormality.  EKG on admission on August 13, 2000, reveals sinus bradycardia, 43 beats per minute, with some nonspecific changes in leads 2, 3, aVF.  No specific ischemic change.  DISCHARGE MEDICATIONS: 1. Enteric-coated aspirin 325 mg one p.o. q.d. 2. Tricor 200 mg one p.o. q.h.s. 3. Norvasc 5 mg one p.o. q.d. 4. Toprol XL 50 mg one p.o. q.d. 5. Altace 5 mg one p.o. q.d. 6. Protonix 40 mg one p.o. q.d. 7. Nitroglycerin 0.4 mg sublingual as directed.  ACTIVITY:  No strenuous activity, lifting greater than 5 pounds, driving, or sexual activity for three days.  DIET:  Low fat, low salt diet.  WOUND CARE:  May gently wash the groin with warm water and soap.  Call the office at [phone] if any increased size or pain of the groin.  DISCHARGE INSTRUCTIONS: 1. Holter monitor.  The office is to call her to let them know when this    Holter  monitor has been precertified and is available. 2. Abdominal ultrasound has been scheduled in the [facility] office for    September 09, 2000, at 11:30 a.m.  She has been instructed of nothing to    eat after midnight. 3. She is to follow up with Dr. Tresa Endo in the [place] office.  The office    has been informed, and they are to call the patient with an appointment.  I    have instructed her that if she has not heard about this or the Holter    monitor in the next week that she needs to contact our office to schedule    these. DD:  08/18/00 TD:  08/18/00 Job: [id number]

## 2011-05-09 NOTE — Discharge Summary (Signed)
[place]  Patient:    Marzano, [name] Visit Number: [id number] MRN: [id number]          Service Type: MED Location: [id number] Attending Physician:  Herminio Heads Dictated by:   Abelino Derrick, P.A.C. Admit Date:  11/16/2001 Discharge Date: 11/17/2001   CC:         Kari Baars, M.D., [address]  [place], [place] [address]   Discharge Summary  DISCHARGE DIAGNOSES: 1. Unstable angina. 2. History of coronary disease, previous right coronary artery and left    anterior descending stenting in April of 2001 in [place], [place].    Follow-up catheterization in August of 2001 showing no restenosis. 3. Hypertension. 4. Known abdominal aortic aneurysm.  The last ultrasound was in May of 2002.    This measured 3.1 x 3.4.  HISTORY OF PRESENT ILLNESS:  The patient is a 76 year old female followed by Kari Baars, M.D., and Lennette Bihari, M.D.  She has a history of LAD and RCA stent in [place], [place], in April of 2001.  The patient says that this hospital course was complicated by a significant bleed.  It is not clear whether this was GI bleeding or a groin bleed.  She says that she was transfused and watched in the intensive care unit for a couple of days.  She also has restudy in August of 2001, which revealed no restenosis of the LAD or RCA.  Her EF was preserved.  She has a history of SVT by Holter and is on low-dose Toprol.  She has baseline sinus bradycardia, but tolerates this well. She was seen in the office as a walk-in on November 16, 2001, in [place], [place], by Sherral Hammers, M.D.  She had been complaining of some substernal chest pain described as pressure and some increasing lower extremity edema.  Unfortunately, she does have a history of noncompliance. Kari Baars, M.D., has chastised her for not keeping her scheduled appointments and for just walking into his clinic.  HOSPITAL COURSE:   The patient was admitted on November 16, 2001, to [place].  She was put on telemetry, started on Lovenox, and given Lasix 40 mg IV b.i.d.  Her enzymes are negative x 3.  Her chest is clear to auscultation on November 17, 2001.  Her lower extremity edema is improved. We plan to discharge her today.  She needs a Cardiolite study done as an outpatient.  I believe this was recommended in the past, but never done.  She also needs a follow-up ultrasound for her aneurysm and I called the office regarding this.  She will see Lennette Bihari, M.D., after these tests are obtained.  She also needs a fasting lipid profile.  DISCHARGE MEDICATIONS: 1. Nexium 40 mg a day. 2. Altace 10 mg a day. 3. Toprol XL 25 mg a day. 4. Imdur 30 mg a day. 5. Hydrochlorothiazide 25 mg a day. 6. Coated aspirin q.d. 7. Vioxx 25 mg a day p.r.n. for back pain.  LABORATORY DATA:  Sodium 142, potassium 4.3, BUN 15, creatinine 1.1.  Liver functions are normal.  INR 0.9.  White count 7.9, hemoglobin 13, hematocrit 38.8, platelets 185.  CK-MBs and troponins are negative x 3.  A chest x-ray was done, but the results are this are pending.  This will be followed up and an addendum will be dictated regarding the chest x-ray report. The EKG shows sinus rhythm and sinus bradycardia with T-wave inversion  in V5 and V6.  PLAN:  The patient will be discharged today.  Delrae Rend, M.D., has stopped her Prilosec and put her on Nexium.  He also recommended increasing her Altace to 10 mg a day and her hydrochlorothiazide to 25 mg a day.  Will stop her Norvasc as this may have contributed to her lower extremity edema. We did add Vioxx at discharge.  She has some chronic low back pain that sounds musculoskeletal.  We will arrange a follow-up Cardiolite study and ultrasound and fasting lipids as an outpatient.  She will see Lennette Bihari, M.D., after these are obtained.  I did encourage her to try and get a  hold of Dr. Kari Baars office and see if she could set up an appointment to see him.  I encouraged her to try and be more compliant with her appointments and she seemed to understand this. Dictated by:   Abelino Derrick, P.A.C. Attending Physician:  Herminio Heads DD:  11/17/01 TD:  11/17/01 Job: [id number]

## 2011-05-09 NOTE — Group Therapy Note (Signed)
   NAME:  [name], [name]                          ACCOUNT NO.:  [id number]   MEDICAL RECORD NO.:  [id number]                   PATIENT TYPE:  INP   LOCATION:  A322                                 FACILITY:  [facility]   PHYSICIAN:  Edward L. Juanetta Gosling, M.D.             DATE OF BIRTH:  1935-01-20   DATE OF PROCEDURE:  DATE OF DISCHARGE:  05/10/2003                                   PROGRESS NOTE   PROBLEMS:  1. Chronic obstructive pulmonary disease with exacerbation.  2. Peripheral vascular disease.  3. Hypertension.   SUBJECTIVE:  This patient is doing much better.   OBJECTIVE:  She continues to have some rhonchi bilaterally, but otherwise is  doing okay.  She has no new symptoms.   ASSESSMENT AND PLAN:  Her son does not want her to be discharged home, but  she is on oral medications and seems to be doing much better; so I am going  to plan to discharge her home.  Please see discharge summary for details.                                               Edward L. Juanetta Gosling, M.D.    ELH/MEDQ  D:  05/10/2003  T:  05/11/2003  Job:  [id number]

## 2011-05-09 NOTE — Group Therapy Note (Signed)
   NAME:  [name], [name]                          ACCOUNT NO.:  [id number]   MEDICAL RECORD NO.:  [id number]                   PATIENT TYPE:  INP   LOCATION:  A322                                 FACILITY:  [facility]   PHYSICIAN:  Edward L. Juanetta Gosling, M.D.             DATE OF BIRTH:  01-26-1935   DATE OF PROCEDURE:  05/09/2003  DATE OF DISCHARGE:                                   PROGRESS NOTE   PROBLEMS:  1. Chronic obstructive pulmonary disease.  2. Peripheral vascular disease.  3. Hypertension.   SUBJECTIVE:  [name] [name] says she is feeling better, but still has a lot of  congestion and cough.  She still has some wheezes bilaterally.   PHYSICAL EXAMINATION:  VITAL SIGNS:  Temperature 97.2, pulse 89,  respirations 18, blood pressure 109/48, O2 saturation 88% on room air, 95%  on 2 L.  CHEST:  With some bilateral rhonchi, but clearer then it has been.  HEART:  Regular without gallop.  ABDOMEN:  Soft.   LABORATORY DATA:  I&O is -260.   ASSESSMENT:  She is improving, pressure is well controlled, peripheral  vascular disease is not an issue right now.  Her coronary artery disease is  well controlled.  Chronic obstructive pulmonary disease is improving.  I am  thinking she will probably be able to be discharged tomorrow.  She may need  oxygen at home considering her O2 saturation of 88%.                                               Edward L. Juanetta Gosling, M.D.    ELH/MEDQ  D:  05/09/2003  T:  05/09/2003  Job:  [id number]

## 2011-05-28 ENCOUNTER — Encounter (INDEPENDENT_AMBULATORY_CARE_PROVIDER_SITE_OTHER): Payer: Medicaid Other | Admitting: Vascular Surgery

## 2011-05-28 DIAGNOSIS — I714 Abdominal aortic aneurysm, without rupture: Secondary | ICD-10-CM

## 2011-05-29 NOTE — Consult Note (Signed)
NEW PATIENT CONSULTATION  [name] [name], [name] M DOB:  October 10, 1935                                       05/28/2011 ZOXWR#:[id number]  I saw the patient in the office today in consultation concerning her 5.2 cm infrarenal abdominal aortic aneurysm.  She was referred by Dr. Tresa Endo.  This is a pleasant 76 year old [name] who has had a long history of aneurysm of an aneurysm.  Apparently, she had been followed for approximately 5 years with ultrasound every 6 months.  In November 2011, it was 5.0 cm in maximum diameter and on her most recent followup study the aneurysm had enlarged to 5.2 cm.  She was sent for further evaluation.  The patient has had some intermittent abdominal pain for many months. She does have a history of cholelithiasis, but apparently it was felt that she was too high risk for cholecystectomy.  She has also had problems with nephrolithiasis in the past.  PAST MEDICAL HISTORY:  Significant for hypertension, hypercholesterolemia, coronary artery disease, and COPD.  She has had previous PTCA in the past, she believes in 1998.  She denies any history of diabetes, history previous myocardial infarction, or history of congestive heart failure.  PAST SURGICAL HISTORY:  Significant for previous operations for nephrolithiasis and also for removal of a cyst of the pituitary gland.  SOCIAL HISTORY:  She is widowed.  She has 2 children.  She does occasionally smoke cigarettes as many of the people at the assisted living smoke also.  FAMILY HISTORY:  She is unaware of any history of aneurysmal disease in family and there is no history of premature cardiovascular disease in the family.  REVIEW OF SYSTEMS:  GENERAL:  She has had no recent weight loss, weight gain, or problems with her appetite. CARDIOVASCULAR:  She does admit to chest pain which has been stable. There has been no new onset chest pain.  She also has orthopnea and dyspnea on exertion.  She  denies any history of stroke or TIAs.  She has no history of DVT or phlebitis. GI:  She has a history of reflux, hiatal hernia, and occasional problems swallowing. NEUROLOGIC:  She has occasional dizziness. PULMONARY:  She has had a productive cough in the past and wheezing. HEMATOLOGIC:  She has had no bleeding problems or clotting disorders. GU:  She has had dysuria and urinary frequency. ENT:  She has had some gradual change in her eyesight. MUSCULOSKELETAL:  She does have arthritis joint pain and muscle pain. PSYCHIATRIC:  She has had depression and anxiety.  PHYSICAL EXAMINATION:  General:  This is a pleasant [name] [name] who appears her stated age.  Vital Signs:  Blood pressure is 138/80, heart rate is 76, rest, respiratory rate is 12.  I calculate her BMI at 30.9.  HEENT:  Unremarkable.  Lungs:  Clear bilaterally to auscultation. Cardiovascular Exam:  I do not detect any carotid bruits.  She has a regular rate and rhythm.  She has palpable femoral pulses.  I cannot palpate popliteal pedal pulses on either side.  She has moderate bilateral lower extremity swelling.  Abdomen:  Soft and nontender.  It is difficult to assess because of her obesity.  She has normal pitched bowel sounds.  Musculoskeletal exam:  There are no major deformities or cyanosis.  Neurologic:  She has no focal weakness or paresthesias. Skin:  No ulcers  or rashes.  I have reviewed her records from Dr. Landry Dyke office.  I have reviewed her ultrasound from May 25, 2011, which shows that the maximum diameter of her infrarenal abdominal aortic aneurysm is 5.2 cm.  I compared this to her study from October 24, 2010, at which time it was 5.0 cm.  I have explained that in any normal-risk patient we would recommend elective repair of an aneurysm at 5.5 cm.  However, I do think she is at increased risk because of her obesity, coronary artery disease, and COPD.  Regardless, however, the aneurysm is not large enough  to consider repair at this time.  I have ordered a followup CT scan in 6 months and I will see her back at that time.  That will help determine if she is a candidate for endovascular repair of her aneurysm if it ever enlarged enough to consider elective repair.  In addition, I have discussed with her the importance of tobacco cessation and explained this does increase her risk of aneurysm enlargement and rupture.  We have also discussed the effects of nicotine with respect to her increased risk of cardiovascular disease and lung cancer.  I will see her back in 6 months.  She knows to call sooner if she has problems.    Di Kindle. Edilia Bo, M.D. Electronically Signed  CSD/MEDQ  D:  05/28/2011  T:  05/29/2011  Job:  [id number]  cc:   Nicki [place], M.D. Edward L. Juanetta Gosling, M.D.

## 2011-09-30 LAB — BASIC METABOLIC PANEL
BUN: 10
CO2: 31
Calcium: 9.1
Creatinine, Ser: 0.96
GFR calc Af Amer: 55 — ABNORMAL LOW
GFR calc Af Amer: >60
GFR calc non Af Amer: 46 — ABNORMAL LOW
GFR calc non Af Amer: 52 — ABNORMAL LOW
Glucose, Bld: 105 — ABNORMAL HIGH
Potassium: 3.8
Potassium: 3.8
Potassium: 3.9
Sodium: 142
Sodium: 143
Sodium: 144

## 2011-09-30 LAB — T4: T4, Total: 6.2

## 2011-09-30 LAB — CORTISOL: Cortisol, Plasma: 10.2

## 2011-10-01 LAB — SODIUM
Sodium: 138
Sodium: 138
Sodium: 141

## 2011-10-01 LAB — DIFFERENTIAL
Eosinophils Absolute: 0
Eosinophils Relative: 0
Lymphs Abs: 1.7

## 2011-10-01 LAB — POCT I-STAT 7, (LYTES, BLD GAS, ICA,H+H)
Acid-Base Excess: 2
Calcium, Ion: 1.2
HCT: 32 — ABNORMAL LOW
O2 Saturation: 100
Operator id: 122891
Patient temperature: 35
Potassium: 3.9
pCO2 arterial: 51.7 — ABNORMAL HIGH

## 2011-10-01 LAB — BASIC METABOLIC PANEL
BUN: 16
BUN: 18
BUN: 26 — ABNORMAL HIGH
CO2: 25
CO2: 27
Calcium: 8.6
Chloride: 104
Chloride: 104
Creatinine, Ser: 0.94
Creatinine, Ser: 1.04
GFR calc Af Amer: 53 — ABNORMAL LOW
GFR calc non Af Amer: 44 — ABNORMAL LOW
GFR calc non Af Amer: 52 — ABNORMAL LOW
Glucose, Bld: 105 — ABNORMAL HIGH
Glucose, Bld: 111 — ABNORMAL HIGH
Glucose, Bld: 115 — ABNORMAL HIGH
Potassium: 3.7
Potassium: 4
Potassium: 4.1
Sodium: 145

## 2011-10-01 LAB — TYPE AND SCREEN: Antibody Screen: NEGATIVE

## 2011-10-01 LAB — CBC
HCT: 30.1 — ABNORMAL LOW
HCT: 38.3
MCHC: 33.6
MCV: 84.7
Platelets: 236
Platelets: 260
RDW: 14.3 — ABNORMAL HIGH
RDW: 15.2 — ABNORMAL HIGH
WBC: 9.1

## 2011-10-01 LAB — POTASSIUM
Potassium: 4.1
Potassium: 4.5

## 2011-10-01 LAB — TSH: TSH: 0.969

## 2011-10-01 LAB — CORTISOL-AM, BLOOD: Cortisol - AM: 11.2

## 2011-10-03 LAB — BASIC METABOLIC PANEL
BUN: 22
CO2: 25
CO2: 27
Calcium: 9
GFR calc Af Amer: 36 — ABNORMAL LOW
GFR calc non Af Amer: 34 — ABNORMAL LOW
GFR calc non Af Amer: 42 — ABNORMAL LOW
Glucose, Bld: 103 — ABNORMAL HIGH
Glucose, Bld: 126 — ABNORMAL HIGH
Glucose, Bld: 98
Potassium: 3.8
Potassium: 3.9
Potassium: 4
Sodium: 138
Sodium: 139
Sodium: 140

## 2011-10-03 LAB — URINALYSIS, ROUTINE W REFLEX MICROSCOPIC
Bilirubin Urine: NEGATIVE
Bilirubin Urine: NEGATIVE
Ketones, ur: NEGATIVE
Ketones, ur: NEGATIVE
Nitrite: NEGATIVE
Nitrite: NEGATIVE
Protein, ur: 30 — AB
Protein, ur: NEGATIVE
Urobilinogen, UA: 0.2
Urobilinogen, UA: 0.2

## 2011-10-03 LAB — COMPREHENSIVE METABOLIC PANEL
ALT: 11
AST: 16
Albumin: 3.3 — ABNORMAL LOW
Alkaline Phosphatase: 99
CO2: 27
Chloride: 105
Creatinine, Ser: 1.57 — ABNORMAL HIGH
GFR calc Af Amer: 40 — ABNORMAL LOW
GFR calc non Af Amer: 33 — ABNORMAL LOW
Potassium: 3.6
Sodium: 138
Total Bilirubin: 0.5

## 2011-10-03 LAB — TYPE AND SCREEN

## 2011-10-03 LAB — CK TOTAL AND CKMB (NOT AT ARMC)
CK, MB: 1
Relative Index: INVALID
Total CK: 34

## 2011-10-03 LAB — I-STAT 8, (EC8 V) (CONVERTED LAB)
BUN: 55 — ABNORMAL HIGH
Bicarbonate: 26.2 — ABNORMAL HIGH
Chloride: 105
Glucose, Bld: 107 — ABNORMAL HIGH
Hemoglobin: 13.6
Sodium: 137

## 2011-10-03 LAB — CREATININE, URINE, RANDOM: Creatinine, Urine: 42.9

## 2011-10-03 LAB — CBC
HCT: 32.8 — ABNORMAL LOW
HCT: 32.8 — ABNORMAL LOW
HCT: 39.6
Hemoglobin: 11.1 — ABNORMAL LOW
Hemoglobin: 11.2 — ABNORMAL LOW
Hemoglobin: 12.9
Hemoglobin: 13.3
MCHC: 33.8
MCHC: 34.1
Platelets: 146 — ABNORMAL LOW
RBC: 3.83 — ABNORMAL LOW
RBC: 4.45
RBC: 4.63
RDW: 14.6 — ABNORMAL HIGH
RDW: 15.3 — ABNORMAL HIGH
WBC: 6.8
WBC: 8.5

## 2011-10-03 LAB — DIFFERENTIAL
Basophils Absolute: 0.1
Lymphocytes Relative: 34
Monocytes Absolute: 0.8 — ABNORMAL HIGH
Monocytes Relative: 9
Neutro Abs: 4.6

## 2011-10-03 LAB — CARDIAC PANEL(CRET KIN+CKTOT+MB+TROPI)
CK, MB: 0.8
Troponin I: 0.04

## 2011-10-03 LAB — POCT CARDIAC MARKERS
CKMB, poc: <1 — ABNORMAL LOW
Operator id: 288331
Troponin i, poc: <0.05

## 2011-10-03 LAB — ABO/RH: ABO/RH(D): O POS

## 2011-10-03 LAB — PROTIME-INR: INR: 1

## 2011-10-03 LAB — APTT: aPTT: 30

## 2011-10-03 LAB — URINE MICROSCOPIC-ADD ON

## 2011-11-18 ENCOUNTER — Encounter: Payer: Self-pay | Admitting: Vascular Surgery

## 2011-11-25 ENCOUNTER — Encounter: Payer: Self-pay | Admitting: Vascular Surgery

## 2011-11-26 ENCOUNTER — Ambulatory Visit (INDEPENDENT_AMBULATORY_CARE_PROVIDER_SITE_OTHER): Payer: Medicare Other | Admitting: Vascular Surgery

## 2011-11-26 ENCOUNTER — Encounter: Payer: Self-pay | Admitting: Vascular Surgery

## 2011-11-26 VITALS — BP 113/70 | HR 58 | Resp 16 | Ht 65.0 in | Wt 195.0 lb

## 2011-11-26 DIAGNOSIS — I714 Abdominal aortic aneurysm, without rupture, unspecified: Secondary | ICD-10-CM

## 2011-11-26 NOTE — Progress Notes (Signed)
[facility] [place]  Patient name: [name] [name] MRN: [id number] DOB: Dec 07, 1935 Sex: female  CC: Follow up of abdominal aortic aneurysm.  HPI: [name] [name] is a 76 y.o. female who I seen in consultation on 05/28/2011 with a 5.2 cm infrarenal abdominal aortic aneurysm. The patient was referred by Dr. Tresa Endo. She has been followed with this aneurysm for approximately 5 years. He had been relatively stable in size. When I saw her in June, I explained it for a normal risk patient we would consider elective repair at 5.5 cm. I scheduled a CT scan in 6 months. She had an ultrasound done at [facility] which showed that the aneurysm had enlarged to 5.5 cm. She was sent back for further assessment.  She does have a history of chronic low back pain. She's had no acute onset abdominal or back pain. She has a history of hypertension and hyperlipidemia both of which are stable on her current medication. She also has a history of COPD. She has atrial fibrillation but is not on Coumadin.  Past Medical History  Diagnosis Date  . Hypertension   . Hyperlipidemia   . COPD (chronic obstructive pulmonary disease)   . Arthritis   . Depression with anxiety   . GERD (gastroesophageal reflux disease)   . Hiatal hernia   . Atrial fibrillation   . Productive cough   . Leg pain   . AAA (abdominal aortic aneurysm)   . CAD (coronary artery disease)     Family History  Problem Relation Age of Onset  . Heart disease Mother   . Hyperlipidemia Mother   . Hypertension Mother   . Stroke Mother   . Cancer Brother     SOCIAL HISTORY: History  Substance Use Topics  . Smoking status: Former Smoker    Types: Cigarettes  . Smokeless tobacco: Former Neurosurgeon    Quit date: 01/11/2008  . Alcohol Use: No    Allergies  Allergen Reactions  . Lidocaine   . Nsaids   . Sulfonamide Derivatives     Current Outpatient Prescriptions  Medication Sig Dispense Refill    . acetaminophen (TYLENOL) 500 MG tablet Take 500 mg by mouth every 6 (six) hours as needed.        Marland Kitchen albuterol (ACCUNEB) 1.25 MG/3ML nebulizer solution Take 1 ampule by nebulization every 6 (six) hours as needed.        . ALPRAZolam (XANAX) 0.5 MG tablet Take 0.5 mg by mouth 3 (three) times daily as needed.        . Alum & Mag Hydroxide-Simeth (ANTACID LIQUID PO) Take by mouth.        Marland Kitchen amLODipine (NORVASC) 5 MG tablet Take 5 mg by mouth daily.        Marland Kitchen omega-3 acid ethyl esters (LOVAZA) 1 G capsule Take 2 g by mouth 2 (two) times daily.        Marland Kitchen omeprazole (PRILOSEC) 20 MG capsule Take 20 mg by mouth daily.        . polyethylene glycol powder (MIRALAX) powder Take 17 g by mouth daily.        . rosuvastatin (CRESTOR) 40 MG tablet Take 40 mg by mouth daily.        . simethicone (MYLICON) 80 MG chewable tablet Chew 80 mg by mouth every 6 (six) hours as needed.        . tiotropium (SPIRIVA HANDIHALER) 18 MCG inhalation capsule Place 18 mcg into inhaler  and inhale daily.          REVIEW OF SYSTEMS: Arly.Keller ] denotes positive finding; [  ] denotes negative finding CARDIOVASCULAR:  Arly.Keller ] chest pain   Arly.Keller ] chest pressure   [ ]  palpitations   Arly.Keller ] orthopnea   Arly.Keller ] dyspnea on exertion   [ ]  claudication   [ ]  rest pain   [ ]  DVT   [ ]  phlebitis PULMONARY:   [ ]  productive cough   [ ]  asthma   [ ]  wheezing NEUROLOGIC:   [ ]  weakness  [ ]  paresthesias  [ ]  aphasia  [ ]  amaurosis  Arly.Keller ] dizziness HEMATOLOGIC:   [ ]  bleeding problems   [ ]  clotting disorders MUSCULOSKELETAL:  [ ]  joint pain   [ ]  joint swelling Arly.Keller ] leg swelling GASTROINTESTINAL: [ ]   blood in stool  [ ]   hematemesis GENITOURINARY:  Arly.Keller ]  dysuria  [ ]   hematuria PSYCHIATRIC:  [ ]  history of major depression INTEGUMENTARY:  [ ]  rashes  [ ]  ulcers CONSTITUTIONAL:  [ ]  fever   [ ]  chills  PHYSICAL EXAM: Filed Vitals:   11/26/11 1333  BP: 113/70  Pulse: 58  Resp: 16  Height: 5\' 5"  (1.651 m)  Weight: 195 lb (88.451 kg)  SpO2: 97%   Body  mass index is 32.45 kg/(m^2). GENERAL: The patient is a well-nourished female, in no acute distress. The vital signs are documented above. CARDIOVASCULAR: There is a regular rate and rhythm without significant murmur appreciated. I do not detect any carotid bruits. She does have palpable femoral pulses although they are somewhat difficult to assess because of her size. I cannot palpate pedal pulses although both feet are warm and well-perfused. She has mild bilateral lower extremity swelling.3 PULMONARY: There is good air exchange bilaterally without wheezing or rales. ABDOMEN: Soft and non-tender with normal pitched bowel sounds. Does of her size I cannot palpate her aneurysm. MUSCULOSKELETAL: There are no major deformities or cyanosis. NEUROLOGIC: No focal weakness or paresthesias are detected. SKIN: There are no ulcers or rashes noted. PSYCHIATRIC: The patient has a normal affect.  DATA:  I have reviewed her records from [facility]. I reviewed her ultrasound which shows that the maximum diameter of her fusiform aneurysm is 5.5 cm. His increased slightly in size compared to the study back in May to  MEDICAL ISSUES: Given that the aneurysm by ultrasound is now 5.5 cm I think we should consider elective repair. However, clearly I think she would be at increased risk for surgery given her obesity and multiple comorbidities including coronary artery disease, COPD, atrial fibrillation, hypertension, and hyperlipidemia. I have recommended that we proceed with a CT scan of the abdomen and pelvis to further assess her aneurysm and help determine if she is a candidate for endovascular repair of the aneurysm. I would certainly favor an endovascular approach if at all possible for the above-mentioned reasons. I'll make further recommendations pending the results of her CT scan. If she does elect to proceed with surgery then certainly she will need preoperative cardiac  evaluation.  Marthe Dant S [facility] [place] Office: [phone]

## 2011-12-03 ENCOUNTER — Inpatient Hospital Stay (HOSPITAL_COMMUNITY): Admission: RE | Admit: 2011-12-03 | Payer: Medicare Other | Source: Ambulatory Visit

## 2011-12-04 ENCOUNTER — Ambulatory Visit (HOSPITAL_COMMUNITY)
Admission: RE | Admit: 2011-12-04 | Discharge: 2011-12-04 | Disposition: A | Discharge status: Home / Self Care | Payer: Medicare Other | Source: Ambulatory Visit | Attending: Vascular Surgery | Admitting: Vascular Surgery

## 2011-12-04 DIAGNOSIS — Z09 Encounter for follow-up examination after completed treatment for conditions other than malignant neoplasm: Secondary | ICD-10-CM | POA: Insufficient documentation

## 2011-12-04 DIAGNOSIS — I714 Abdominal aortic aneurysm, without rupture, unspecified: Secondary | ICD-10-CM | POA: Insufficient documentation

## 2011-12-04 LAB — CREATININE, SERUM
Creatinine, Ser: 1.35 mg/dL — ABNORMAL HIGH (ref 0.50–1.10)
GFR calc non Af Amer: 38 mL/min — ABNORMAL LOW (ref >90)

## 2011-12-04 MED ORDER — IOHEXOL 350 MG/ML SOLN
100.0000 mL | Freq: Once | INTRAVENOUS | Status: AC | PRN
  Administered 2011-12-04: 100 mL via INTRAVENOUS

## 2011-12-09 ENCOUNTER — Encounter: Payer: Self-pay | Admitting: Vascular Surgery

## 2011-12-10 ENCOUNTER — Encounter: Payer: Self-pay | Admitting: Vascular Surgery

## 2011-12-10 ENCOUNTER — Ambulatory Visit (INDEPENDENT_AMBULATORY_CARE_PROVIDER_SITE_OTHER): Payer: Medicare Other | Admitting: Vascular Surgery

## 2011-12-10 VITALS — BP 128/78 | HR 57 | Resp 16 | Ht 65.0 in | Wt 192.0 lb

## 2011-12-10 DIAGNOSIS — I714 Abdominal aortic aneurysm, without rupture: Secondary | ICD-10-CM

## 2011-12-10 NOTE — Progress Notes (Signed)
[facility] [place]  Patient name: [name] [name] MRN: [id number] DOB: 02-05-1935 Sex: female  CC: follow up of abdominal aortic aneurysm  HPI: [name] [name] is a 76 y.o. female who I seen in consultation in June of 2012 with a 5.2 cm infrarenal abdominal aortic aneurysm. The patient had had a recent ultrasound when I saw her on 11/26/2011 which suggested that the aneurysm had increased in size to 5.5 cm. I set her up for a CT scan to further assess her aneurysm. Comes in today after having had her CAT scan. She has some chronic low back pain but has had no new onset abdominal or back pain.  Past Medical History  Diagnosis Date  . Hypertension   . Hyperlipidemia   . COPD (chronic obstructive pulmonary disease)   . Arthritis   . Depression with anxiety   . GERD (gastroesophageal reflux disease)   . Hiatal hernia   . Atrial fibrillation   . Productive cough   . Leg pain   . AAA (abdominal aortic aneurysm)   . CAD (coronary artery disease)     Family History  Problem Relation Age of Onset  . Heart disease Mother   . Hyperlipidemia Mother   . Hypertension Mother   . Stroke Mother   . Cancer Brother     SOCIAL HISTORY: History  Substance Use Topics  . Smoking status: Former Smoker    Types: Cigarettes  . Smokeless tobacco: Former Neurosurgeon    Quit date: 01/11/2008  . Alcohol Use: No    Allergies  Allergen Reactions  . Lidocaine   . Nsaids   . Sulfonamide Derivatives     Current Outpatient Prescriptions  Medication Sig Dispense Refill  . acetaminophen (TYLENOL) 500 MG tablet Take 500 mg by mouth every 6 (six) hours as needed.        Marland Kitchen albuterol (ACCUNEB) 1.25 MG/3ML nebulizer solution Take 1 ampule by nebulization every 6 (six) hours as needed.        . ALPRAZolam (XANAX) 0.5 MG tablet Take 0.5 mg by mouth 3 (three) times daily as needed.        . Alum & Mag Hydroxide-Simeth (ANTACID LIQUID PO) Take by mouth.        Marland Kitchen amLODipine (NORVASC) 5 MG  tablet Take 5 mg by mouth daily.        Marland Kitchen omega-3 acid ethyl esters (LOVAZA) 1 G capsule Take 2 g by mouth 2 (two) times daily.        Marland Kitchen omeprazole (PRILOSEC) 20 MG capsule Take 20 mg by mouth daily.        . polyethylene glycol powder (MIRALAX) powder Take 17 g by mouth daily.        . rosuvastatin (CRESTOR) 40 MG tablet Take 40 mg by mouth daily.        . simethicone (MYLICON) 80 MG chewable tablet Chew 80 mg by mouth every 6 (six) hours as needed.        . tiotropium (SPIRIVA HANDIHALER) 18 MCG inhalation capsule Place 18 mcg into inhaler and inhale daily.          REVIEW OF SYSTEMS: Arly.Keller ] denotes positive finding; [  ] denotes negative finding CARDIOVASCULAR:  [ ]  chest pain   [ ]  chest pressure   [ ]  palpitations   [ ]  orthopnea   [ ]  dyspnea on exertion   [ ]  claudication   [ ]  rest pain   [ ]  DVT   [ ]   phlebitis PULMONARY:   [ ]  productive cough   [ ]  asthma   [ ]  wheezing  PHYSICAL EXAM: Filed Vitals:   12/10/11 1619  BP: 128/78  Pulse: 57  Resp: 16  Height: 5\' 5"  (1.651 m)  Weight: 192 lb (87.091 kg)  SpO2: 97%   Body mass index is 31.95 kg/(m^2). GENERAL: The patient is a well-nourished female, in no acute distress. The vital signs are documented above. CARDIOVASCULAR: There is a regular rate and rhythm without significant murmur appreciated. I do not detect any carotid bruits PULMONARY: There is good air exchange bilaterally without wheezing or rales. ABDOMEN: obese and difficult to assess for her aneurysm. MUSCULOSKELETAL: There are no major deformities or cyanosis. NEUROLOGIC: No focal weakness or paresthesias are detected.  DATA:  I have independently reviewed her CT scan and by my measurement the maximum diameter of the aneurysm is 5.2 cm. She has a sensory renal arteries bilaterally. It does appear that she has an adequate neck for EVAR in the future if the aneurysm enlarges significantly.  MEDICAL ISSUES: Based on my interpretation his aneurysm has not changed  significantly in size since June. I've explained to her previously that in a normal risk patient we would consider elective repair at 5.5 cm. Her aneurysm currently measures 5.2 cm. I've ordered a follow up CT scan in 6 months and I'll see her back at that time. If the aneurysm dozen large to greater than 5.5 cm I think she can be considered for EVAR. Given her obesity and other medical comorbidities certainly she would be at high risk for an open repair.  Karolee Meloni S [facility] [place] Office: [phone]

## 2011-12-25 DIAGNOSIS — J449 Chronic obstructive pulmonary disease, unspecified: Secondary | ICD-10-CM | POA: Diagnosis not present

## 2011-12-25 DIAGNOSIS — J440 Chronic obstructive pulmonary disease with acute lower respiratory infection: Secondary | ICD-10-CM | POA: Diagnosis not present

## 2011-12-31 DIAGNOSIS — M6281 Muscle weakness (generalized): Secondary | ICD-10-CM | POA: Diagnosis not present

## 2011-12-31 DIAGNOSIS — M159 Polyosteoarthritis, unspecified: Secondary | ICD-10-CM | POA: Diagnosis not present

## 2011-12-31 DIAGNOSIS — I2510 Atherosclerotic heart disease of native coronary artery without angina pectoris: Secondary | ICD-10-CM | POA: Diagnosis not present

## 2011-12-31 DIAGNOSIS — I10 Essential (primary) hypertension: Secondary | ICD-10-CM | POA: Diagnosis not present

## 2011-12-31 DIAGNOSIS — J440 Chronic obstructive pulmonary disease with acute lower respiratory infection: Secondary | ICD-10-CM | POA: Diagnosis not present

## 2012-01-01 DIAGNOSIS — M6281 Muscle weakness (generalized): Secondary | ICD-10-CM | POA: Diagnosis not present

## 2012-01-01 DIAGNOSIS — J440 Chronic obstructive pulmonary disease with acute lower respiratory infection: Secondary | ICD-10-CM | POA: Diagnosis not present

## 2012-01-01 DIAGNOSIS — I2510 Atherosclerotic heart disease of native coronary artery without angina pectoris: Secondary | ICD-10-CM | POA: Diagnosis not present

## 2012-01-01 DIAGNOSIS — I10 Essential (primary) hypertension: Secondary | ICD-10-CM | POA: Diagnosis not present

## 2012-01-01 DIAGNOSIS — M159 Polyosteoarthritis, unspecified: Secondary | ICD-10-CM | POA: Diagnosis not present

## 2012-01-02 DIAGNOSIS — J440 Chronic obstructive pulmonary disease with acute lower respiratory infection: Secondary | ICD-10-CM | POA: Diagnosis not present

## 2012-01-02 DIAGNOSIS — I10 Essential (primary) hypertension: Secondary | ICD-10-CM | POA: Diagnosis not present

## 2012-01-02 DIAGNOSIS — I2510 Atherosclerotic heart disease of native coronary artery without angina pectoris: Secondary | ICD-10-CM | POA: Diagnosis not present

## 2012-01-02 DIAGNOSIS — M159 Polyosteoarthritis, unspecified: Secondary | ICD-10-CM | POA: Diagnosis not present

## 2012-01-02 DIAGNOSIS — M6281 Muscle weakness (generalized): Secondary | ICD-10-CM | POA: Diagnosis not present

## 2012-01-05 DIAGNOSIS — M6281 Muscle weakness (generalized): Secondary | ICD-10-CM | POA: Diagnosis not present

## 2012-01-05 DIAGNOSIS — J440 Chronic obstructive pulmonary disease with acute lower respiratory infection: Secondary | ICD-10-CM | POA: Diagnosis not present

## 2012-01-05 DIAGNOSIS — M159 Polyosteoarthritis, unspecified: Secondary | ICD-10-CM | POA: Diagnosis not present

## 2012-01-05 DIAGNOSIS — I2510 Atherosclerotic heart disease of native coronary artery without angina pectoris: Secondary | ICD-10-CM | POA: Diagnosis not present

## 2012-01-05 DIAGNOSIS — I10 Essential (primary) hypertension: Secondary | ICD-10-CM | POA: Diagnosis not present

## 2012-01-06 DIAGNOSIS — J440 Chronic obstructive pulmonary disease with acute lower respiratory infection: Secondary | ICD-10-CM | POA: Diagnosis not present

## 2012-01-06 DIAGNOSIS — M6281 Muscle weakness (generalized): Secondary | ICD-10-CM | POA: Diagnosis not present

## 2012-01-06 DIAGNOSIS — I2510 Atherosclerotic heart disease of native coronary artery without angina pectoris: Secondary | ICD-10-CM | POA: Diagnosis not present

## 2012-01-06 DIAGNOSIS — I10 Essential (primary) hypertension: Secondary | ICD-10-CM | POA: Diagnosis not present

## 2012-01-06 DIAGNOSIS — M159 Polyosteoarthritis, unspecified: Secondary | ICD-10-CM | POA: Diagnosis not present

## 2012-01-08 DIAGNOSIS — I2510 Atherosclerotic heart disease of native coronary artery without angina pectoris: Secondary | ICD-10-CM | POA: Diagnosis not present

## 2012-01-08 DIAGNOSIS — M6281 Muscle weakness (generalized): Secondary | ICD-10-CM | POA: Diagnosis not present

## 2012-01-08 DIAGNOSIS — I10 Essential (primary) hypertension: Secondary | ICD-10-CM | POA: Diagnosis not present

## 2012-01-08 DIAGNOSIS — J440 Chronic obstructive pulmonary disease with acute lower respiratory infection: Secondary | ICD-10-CM | POA: Diagnosis not present

## 2012-01-08 DIAGNOSIS — M159 Polyosteoarthritis, unspecified: Secondary | ICD-10-CM | POA: Diagnosis not present

## 2012-01-12 DIAGNOSIS — I119 Hypertensive heart disease without heart failure: Secondary | ICD-10-CM | POA: Diagnosis not present

## 2012-01-12 DIAGNOSIS — R609 Edema, unspecified: Secondary | ICD-10-CM | POA: Diagnosis not present

## 2012-01-12 DIAGNOSIS — I700 Atherosclerosis of aorta: Secondary | ICD-10-CM | POA: Diagnosis not present

## 2012-01-12 DIAGNOSIS — E782 Mixed hyperlipidemia: Secondary | ICD-10-CM | POA: Diagnosis not present

## 2012-01-13 DIAGNOSIS — I10 Essential (primary) hypertension: Secondary | ICD-10-CM | POA: Diagnosis not present

## 2012-01-13 DIAGNOSIS — J440 Chronic obstructive pulmonary disease with acute lower respiratory infection: Secondary | ICD-10-CM | POA: Diagnosis not present

## 2012-01-13 DIAGNOSIS — M159 Polyosteoarthritis, unspecified: Secondary | ICD-10-CM | POA: Diagnosis not present

## 2012-01-13 DIAGNOSIS — M6281 Muscle weakness (generalized): Secondary | ICD-10-CM | POA: Diagnosis not present

## 2012-01-13 DIAGNOSIS — I2510 Atherosclerotic heart disease of native coronary artery without angina pectoris: Secondary | ICD-10-CM | POA: Diagnosis not present

## 2012-01-15 DIAGNOSIS — M6281 Muscle weakness (generalized): Secondary | ICD-10-CM | POA: Diagnosis not present

## 2012-01-15 DIAGNOSIS — I10 Essential (primary) hypertension: Secondary | ICD-10-CM | POA: Diagnosis not present

## 2012-01-15 DIAGNOSIS — M159 Polyosteoarthritis, unspecified: Secondary | ICD-10-CM | POA: Diagnosis not present

## 2012-01-15 DIAGNOSIS — I2510 Atherosclerotic heart disease of native coronary artery without angina pectoris: Secondary | ICD-10-CM | POA: Diagnosis not present

## 2012-01-15 DIAGNOSIS — J440 Chronic obstructive pulmonary disease with acute lower respiratory infection: Secondary | ICD-10-CM | POA: Diagnosis not present

## 2012-01-16 DIAGNOSIS — I10 Essential (primary) hypertension: Secondary | ICD-10-CM | POA: Diagnosis not present

## 2012-01-16 DIAGNOSIS — M159 Polyosteoarthritis, unspecified: Secondary | ICD-10-CM | POA: Diagnosis not present

## 2012-01-16 DIAGNOSIS — M6281 Muscle weakness (generalized): Secondary | ICD-10-CM | POA: Diagnosis not present

## 2012-01-16 DIAGNOSIS — I2510 Atherosclerotic heart disease of native coronary artery without angina pectoris: Secondary | ICD-10-CM | POA: Diagnosis not present

## 2012-01-16 DIAGNOSIS — J440 Chronic obstructive pulmonary disease with acute lower respiratory infection: Secondary | ICD-10-CM | POA: Diagnosis not present

## 2012-01-19 DIAGNOSIS — M159 Polyosteoarthritis, unspecified: Secondary | ICD-10-CM | POA: Diagnosis not present

## 2012-01-19 DIAGNOSIS — M6281 Muscle weakness (generalized): Secondary | ICD-10-CM | POA: Diagnosis not present

## 2012-01-19 DIAGNOSIS — I2510 Atherosclerotic heart disease of native coronary artery without angina pectoris: Secondary | ICD-10-CM | POA: Diagnosis not present

## 2012-01-19 DIAGNOSIS — J440 Chronic obstructive pulmonary disease with acute lower respiratory infection: Secondary | ICD-10-CM | POA: Diagnosis not present

## 2012-01-19 DIAGNOSIS — I10 Essential (primary) hypertension: Secondary | ICD-10-CM | POA: Diagnosis not present

## 2012-01-20 DIAGNOSIS — M159 Polyosteoarthritis, unspecified: Secondary | ICD-10-CM | POA: Diagnosis not present

## 2012-01-20 DIAGNOSIS — I10 Essential (primary) hypertension: Secondary | ICD-10-CM | POA: Diagnosis not present

## 2012-01-20 DIAGNOSIS — M6281 Muscle weakness (generalized): Secondary | ICD-10-CM | POA: Diagnosis not present

## 2012-01-20 DIAGNOSIS — I2510 Atherosclerotic heart disease of native coronary artery without angina pectoris: Secondary | ICD-10-CM | POA: Diagnosis not present

## 2012-01-20 DIAGNOSIS — J440 Chronic obstructive pulmonary disease with acute lower respiratory infection: Secondary | ICD-10-CM | POA: Diagnosis not present

## 2012-01-22 DIAGNOSIS — J440 Chronic obstructive pulmonary disease with acute lower respiratory infection: Secondary | ICD-10-CM | POA: Diagnosis not present

## 2012-01-22 DIAGNOSIS — M6281 Muscle weakness (generalized): Secondary | ICD-10-CM | POA: Diagnosis not present

## 2012-01-22 DIAGNOSIS — M159 Polyosteoarthritis, unspecified: Secondary | ICD-10-CM | POA: Diagnosis not present

## 2012-01-22 DIAGNOSIS — I10 Essential (primary) hypertension: Secondary | ICD-10-CM | POA: Diagnosis not present

## 2012-01-22 DIAGNOSIS — I2510 Atherosclerotic heart disease of native coronary artery without angina pectoris: Secondary | ICD-10-CM | POA: Diagnosis not present

## 2012-01-23 DIAGNOSIS — I2510 Atherosclerotic heart disease of native coronary artery without angina pectoris: Secondary | ICD-10-CM | POA: Diagnosis not present

## 2012-01-23 DIAGNOSIS — M159 Polyosteoarthritis, unspecified: Secondary | ICD-10-CM | POA: Diagnosis not present

## 2012-01-23 DIAGNOSIS — J440 Chronic obstructive pulmonary disease with acute lower respiratory infection: Secondary | ICD-10-CM | POA: Diagnosis not present

## 2012-01-23 DIAGNOSIS — M6281 Muscle weakness (generalized): Secondary | ICD-10-CM | POA: Diagnosis not present

## 2012-01-23 DIAGNOSIS — I10 Essential (primary) hypertension: Secondary | ICD-10-CM | POA: Diagnosis not present

## 2012-01-27 DIAGNOSIS — M159 Polyosteoarthritis, unspecified: Secondary | ICD-10-CM | POA: Diagnosis not present

## 2012-01-27 DIAGNOSIS — I2510 Atherosclerotic heart disease of native coronary artery without angina pectoris: Secondary | ICD-10-CM | POA: Diagnosis not present

## 2012-01-27 DIAGNOSIS — J440 Chronic obstructive pulmonary disease with acute lower respiratory infection: Secondary | ICD-10-CM | POA: Diagnosis not present

## 2012-01-27 DIAGNOSIS — I10 Essential (primary) hypertension: Secondary | ICD-10-CM | POA: Diagnosis not present

## 2012-01-27 DIAGNOSIS — M6281 Muscle weakness (generalized): Secondary | ICD-10-CM | POA: Diagnosis not present

## 2012-01-29 DIAGNOSIS — M6281 Muscle weakness (generalized): Secondary | ICD-10-CM | POA: Diagnosis not present

## 2012-01-29 DIAGNOSIS — I10 Essential (primary) hypertension: Secondary | ICD-10-CM | POA: Diagnosis not present

## 2012-01-29 DIAGNOSIS — J440 Chronic obstructive pulmonary disease with acute lower respiratory infection: Secondary | ICD-10-CM | POA: Diagnosis not present

## 2012-01-29 DIAGNOSIS — M159 Polyosteoarthritis, unspecified: Secondary | ICD-10-CM | POA: Diagnosis not present

## 2012-01-29 DIAGNOSIS — I2510 Atherosclerotic heart disease of native coronary artery without angina pectoris: Secondary | ICD-10-CM | POA: Diagnosis not present

## 2012-01-30 DIAGNOSIS — M159 Polyosteoarthritis, unspecified: Secondary | ICD-10-CM | POA: Diagnosis not present

## 2012-01-30 DIAGNOSIS — I2510 Atherosclerotic heart disease of native coronary artery without angina pectoris: Secondary | ICD-10-CM | POA: Diagnosis not present

## 2012-01-30 DIAGNOSIS — M6281 Muscle weakness (generalized): Secondary | ICD-10-CM | POA: Diagnosis not present

## 2012-01-30 DIAGNOSIS — I10 Essential (primary) hypertension: Secondary | ICD-10-CM | POA: Diagnosis not present

## 2012-01-30 DIAGNOSIS — J440 Chronic obstructive pulmonary disease with acute lower respiratory infection: Secondary | ICD-10-CM | POA: Diagnosis not present

## 2012-02-02 DIAGNOSIS — J440 Chronic obstructive pulmonary disease with acute lower respiratory infection: Secondary | ICD-10-CM | POA: Diagnosis not present

## 2012-02-02 DIAGNOSIS — M6281 Muscle weakness (generalized): Secondary | ICD-10-CM | POA: Diagnosis not present

## 2012-02-02 DIAGNOSIS — I10 Essential (primary) hypertension: Secondary | ICD-10-CM | POA: Diagnosis not present

## 2012-02-02 DIAGNOSIS — M159 Polyosteoarthritis, unspecified: Secondary | ICD-10-CM | POA: Diagnosis not present

## 2012-02-02 DIAGNOSIS — I2510 Atherosclerotic heart disease of native coronary artery without angina pectoris: Secondary | ICD-10-CM | POA: Diagnosis not present

## 2012-02-03 DIAGNOSIS — M6281 Muscle weakness (generalized): Secondary | ICD-10-CM | POA: Diagnosis not present

## 2012-02-03 DIAGNOSIS — I2510 Atherosclerotic heart disease of native coronary artery without angina pectoris: Secondary | ICD-10-CM | POA: Diagnosis not present

## 2012-02-03 DIAGNOSIS — I10 Essential (primary) hypertension: Secondary | ICD-10-CM | POA: Diagnosis not present

## 2012-02-03 DIAGNOSIS — J440 Chronic obstructive pulmonary disease with acute lower respiratory infection: Secondary | ICD-10-CM | POA: Diagnosis not present

## 2012-02-03 DIAGNOSIS — M159 Polyosteoarthritis, unspecified: Secondary | ICD-10-CM | POA: Diagnosis not present

## 2012-02-17 DIAGNOSIS — M79609 Pain in unspecified limb: Secondary | ICD-10-CM | POA: Diagnosis not present

## 2012-02-17 DIAGNOSIS — M545 Low back pain: Secondary | ICD-10-CM | POA: Diagnosis not present

## 2012-02-17 DIAGNOSIS — J449 Chronic obstructive pulmonary disease, unspecified: Secondary | ICD-10-CM | POA: Diagnosis not present

## 2012-02-17 DIAGNOSIS — I10 Essential (primary) hypertension: Secondary | ICD-10-CM | POA: Diagnosis not present

## 2012-03-24 DIAGNOSIS — M79609 Pain in unspecified limb: Secondary | ICD-10-CM | POA: Diagnosis not present

## 2012-03-24 DIAGNOSIS — L600 Ingrowing nail: Secondary | ICD-10-CM | POA: Diagnosis not present

## 2012-04-05 DIAGNOSIS — N390 Urinary tract infection, site not specified: Secondary | ICD-10-CM | POA: Diagnosis not present

## 2012-04-19 DIAGNOSIS — J449 Chronic obstructive pulmonary disease, unspecified: Secondary | ICD-10-CM | POA: Diagnosis not present

## 2012-04-19 DIAGNOSIS — N390 Urinary tract infection, site not specified: Secondary | ICD-10-CM | POA: Diagnosis not present

## 2012-04-19 DIAGNOSIS — I10 Essential (primary) hypertension: Secondary | ICD-10-CM | POA: Diagnosis not present

## 2012-04-19 DIAGNOSIS — E785 Hyperlipidemia, unspecified: Secondary | ICD-10-CM | POA: Diagnosis not present

## 2012-05-23 ENCOUNTER — Encounter (HOSPITAL_COMMUNITY): Payer: Self-pay | Admitting: *Deleted

## 2012-05-23 ENCOUNTER — Observation Stay (HOSPITAL_COMMUNITY)
Admission: EM | Admit: 2012-05-23 | Discharge: 2012-05-24 | Payer: Medicare Other | Attending: Pulmonary Disease | Admitting: Pulmonary Disease

## 2012-05-23 ENCOUNTER — Emergency Department (HOSPITAL_COMMUNITY): Payer: Medicare Other

## 2012-05-23 DIAGNOSIS — Z886 Allergy status to analgesic agent status: Secondary | ICD-10-CM | POA: Insufficient documentation

## 2012-05-23 DIAGNOSIS — E785 Hyperlipidemia, unspecified: Secondary | ICD-10-CM | POA: Insufficient documentation

## 2012-05-23 DIAGNOSIS — I2510 Atherosclerotic heart disease of native coronary artery without angina pectoris: Secondary | ICD-10-CM | POA: Insufficient documentation

## 2012-05-23 DIAGNOSIS — I10 Essential (primary) hypertension: Secondary | ICD-10-CM | POA: Insufficient documentation

## 2012-05-23 DIAGNOSIS — R072 Precordial pain: Secondary | ICD-10-CM | POA: Diagnosis not present

## 2012-05-23 DIAGNOSIS — N189 Chronic kidney disease, unspecified: Secondary | ICD-10-CM | POA: Insufficient documentation

## 2012-05-23 DIAGNOSIS — R0602 Shortness of breath: Secondary | ICD-10-CM | POA: Diagnosis not present

## 2012-05-23 DIAGNOSIS — J449 Chronic obstructive pulmonary disease, unspecified: Secondary | ICD-10-CM | POA: Diagnosis not present

## 2012-05-23 DIAGNOSIS — I714 Abdominal aortic aneurysm, without rupture, unspecified: Secondary | ICD-10-CM | POA: Insufficient documentation

## 2012-05-23 DIAGNOSIS — M1610 Unilateral primary osteoarthritis, unspecified hip: Secondary | ICD-10-CM | POA: Insufficient documentation

## 2012-05-23 DIAGNOSIS — J4489 Other specified chronic obstructive pulmonary disease: Secondary | ICD-10-CM | POA: Insufficient documentation

## 2012-05-23 DIAGNOSIS — R079 Chest pain, unspecified: Principal | ICD-10-CM | POA: Insufficient documentation

## 2012-05-23 DIAGNOSIS — R918 Other nonspecific abnormal finding of lung field: Secondary | ICD-10-CM | POA: Diagnosis not present

## 2012-05-23 LAB — CBC
MCV: 89 fL (ref 78.0–100.0)
Platelets: 166 10*3/uL (ref 150–400)
RBC: 4.38 MIL/uL (ref 3.87–5.11)
RDW: 16.1 % — ABNORMAL HIGH (ref 11.5–15.5)
WBC: 6.7 10*3/uL (ref 4.0–10.5)

## 2012-05-23 LAB — PRO B NATRIURETIC PEPTIDE: Pro B Natriuretic peptide (BNP): 484 pg/mL — ABNORMAL HIGH (ref 0–125)

## 2012-05-23 LAB — DIFFERENTIAL
Basophils Absolute: 0 10*3/uL (ref 0.0–0.1)
Eosinophils Absolute: 0.1 10*3/uL (ref 0.0–0.7)
Eosinophils Relative: 1 % (ref 0–5)
Lymphocytes Relative: 43 % (ref 12–46)
Neutrophils Relative %: 48 % (ref 43–77)

## 2012-05-23 LAB — CARDIAC PANEL(CRET KIN+CKTOT+MB+TROPI)
CK, MB: 1.7 ng/mL (ref 0.3–4.0)
Relative Index: INVALID (ref 0.0–2.5)
Total CK: 86 U/L (ref 7–177)
Troponin I: <0.3 ng/mL (ref <0.30)

## 2012-05-23 LAB — PROTIME-INR: Prothrombin Time: 13.7 seconds (ref 11.6–15.2)

## 2012-05-23 LAB — POCT I-STAT, CHEM 8
Calcium, Ion: 1.19 mmol/L (ref 1.12–1.32)
Chloride: 107 mEq/L (ref 96–112)
Creatinine, Ser: 2 mg/dL — ABNORMAL HIGH (ref 0.50–1.10)
Glucose, Bld: 85 mg/dL (ref 70–99)
Potassium: 4.1 mEq/L (ref 3.5–5.1)

## 2012-05-23 LAB — CREATININE, SERUM: GFR calc Af Amer: 31 mL/min — ABNORMAL LOW (ref >90)

## 2012-05-23 LAB — APTT: aPTT: 29 seconds (ref 24–37)

## 2012-05-23 MED ORDER — ATORVASTATIN CALCIUM 40 MG PO TABS
80.0000 mg | ORAL_TABLET | Freq: Every day | ORAL | Status: DC
  Administered 2012-05-23: 80 mg via ORAL
  Filled 2012-05-23: qty 2

## 2012-05-23 MED ORDER — ALBUTEROL SULFATE (5 MG/ML) 0.5% IN NEBU
1.2500 mg | INHALATION_SOLUTION | Freq: Four times a day (QID) | RESPIRATORY_TRACT | Status: DC | PRN
  Administered 2012-05-23: 1.25 mg via RESPIRATORY_TRACT
  Filled 2012-05-23: qty 0.5

## 2012-05-23 MED ORDER — NITROGLYCERIN 0.4 MG SL SUBL: 0.4000 mg | SUBLINGUAL_TABLET | SUBLINGUAL | Status: DC | PRN

## 2012-05-23 MED ORDER — SODIUM CHLORIDE 0.9 % IJ SOLN: 3.0000 mL | INTRAMUSCULAR | Status: DC | PRN

## 2012-05-23 MED ORDER — SIMETHICONE 80 MG PO CHEW
80.0000 mg | CHEWABLE_TABLET | Freq: Three times a day (TID) | ORAL | Status: DC
  Administered 2012-05-23 – 2012-05-24 (×3): 80 mg via ORAL
  Filled 2012-05-23 (×3): qty 1

## 2012-05-23 MED ORDER — OMEGA-3-ACID ETHYL ESTERS 1 G PO CAPS
1.0000 g | ORAL_CAPSULE | Freq: Two times a day (BID) | ORAL | Status: DC
  Administered 2012-05-23 – 2012-05-24 (×2): 1 g via ORAL
  Filled 2012-05-23 (×2): qty 1

## 2012-05-23 MED ORDER — MORPHINE SULFATE 2 MG/ML IJ SOLN: 2.0000 mg | INTRAMUSCULAR | Status: DC | PRN

## 2012-05-23 MED ORDER — FENOFIBRATE 160 MG PO TABS
160.0000 mg | ORAL_TABLET | Freq: Every day | ORAL | Status: DC
  Administered 2012-05-24: 160 mg via ORAL
  Filled 2012-05-23 (×4): qty 1

## 2012-05-23 MED ORDER — AMLODIPINE BESYLATE 5 MG PO TABS
2.5000 mg | ORAL_TABLET | Freq: Every day | ORAL | Status: DC
  Administered 2012-05-24: 2.5 mg via ORAL
  Filled 2012-05-23: qty 1

## 2012-05-23 MED ORDER — ACETAMINOPHEN 325 MG PO TABS: 650.0000 mg | ORAL_TABLET | Freq: Four times a day (QID) | ORAL | Status: DC | PRN

## 2012-05-23 MED ORDER — PANTOPRAZOLE SODIUM 40 MG PO TBEC
40.0000 mg | DELAYED_RELEASE_TABLET | Freq: Every day | ORAL | Status: DC
  Administered 2012-05-23: 40 mg via ORAL
  Filled 2012-05-23: qty 1

## 2012-05-23 MED ORDER — TORSEMIDE 20 MG PO TABS
20.0000 mg | ORAL_TABLET | Freq: Two times a day (BID) | ORAL | Status: DC
  Administered 2012-05-23 – 2012-05-24 (×2): 20 mg via ORAL
  Filled 2012-05-23 (×2): qty 1

## 2012-05-23 MED ORDER — NEBIVOLOL HCL 2.5 MG PO TABS
5.0000 mg | ORAL_TABLET | Freq: Every day | ORAL | Status: DC
  Administered 2012-05-24: 5 mg via ORAL
  Filled 2012-05-23: qty 1
  Filled 2012-05-23 (×2): qty 2

## 2012-05-23 MED ORDER — TIOTROPIUM BROMIDE MONOHYDRATE 18 MCG IN CAPS
18.0000 ug | ORAL_CAPSULE | Freq: Every day | RESPIRATORY_TRACT | Status: DC
  Administered 2012-05-24: 18 ug via RESPIRATORY_TRACT
  Filled 2012-05-23: qty 5

## 2012-05-23 MED ORDER — ACETAMINOPHEN 650 MG RE SUPP: 650.0000 mg | Freq: Four times a day (QID) | RECTAL | Status: DC | PRN

## 2012-05-23 MED ORDER — ENOXAPARIN SODIUM 30 MG/0.3ML ~~LOC~~ SOLN
30.0000 mg | SUBCUTANEOUS | Status: DC
  Administered 2012-05-23: 30 mg via SUBCUTANEOUS
  Filled 2012-05-23: qty 0.3

## 2012-05-23 MED ORDER — OXYCODONE HCL 5 MG PO TABS: 5.0000 mg | ORAL_TABLET | ORAL | Status: DC | PRN

## 2012-05-23 MED ORDER — HYDROCODONE-ACETAMINOPHEN 10-325 MG PO TABS
1.0000 | ORAL_TABLET | Freq: Three times a day (TID) | ORAL | Status: DC
  Administered 2012-05-23 – 2012-05-24 (×3): 1 via ORAL
  Filled 2012-05-23 (×3): qty 1

## 2012-05-23 MED ORDER — SODIUM CHLORIDE 0.9 % IJ SOLN: 3.0000 mL | Freq: Two times a day (BID) | INTRAMUSCULAR | Status: DC

## 2012-05-23 MED ORDER — SODIUM CHLORIDE 0.9 % IV SOLN: 250.0000 mL | INTRAVENOUS | Status: DC | PRN

## 2012-05-23 MED ORDER — ALPRAZOLAM 1 MG PO TABS
1.0000 mg | ORAL_TABLET | Freq: Three times a day (TID) | ORAL | Status: DC
  Administered 2012-05-23 – 2012-05-24 (×3): 1 mg via ORAL
  Filled 2012-05-23 (×3): qty 1

## 2012-05-23 NOTE — ED Provider Notes (Signed)
History     CSN: [id number]  Arrival date & time 05/23/12  1232   First MD Initiated Contact with Patient 05/23/12 1313      Chief Complaint  Patient presents with  . Chest Pain  . Shortness of Breath    (Consider location/radiation/quality/duration/timing/severity/associated sxs/prior treatment) HPI PT with multiple medical problems reports she was sitting on the porch a short time prior to arrival when she began to have sharp midsternal chest pain, associated with moderate SOB. She states she went inside to rest, but pain continued. She is still having pain now. No particular provoking or relieving factors. No nausea or vomiting. She has trouble breathing at baseline with COPD, but she feels it is worse today. No reported history of CHF but she has had LE edema.   Past Medical History  Diagnosis Date  . Hypertension   . Hyperlipidemia   . COPD (chronic obstructive pulmonary disease)   . Arthritis   . Depression with anxiety   . GERD (gastroesophageal reflux disease)   . Hiatal hernia   . Atrial fibrillation   . Productive cough   . Leg pain   . AAA (abdominal aortic aneurysm)   . CAD (coronary artery disease)     Past Surgical History  Procedure Date  . Kidney stone surgery   . Cystectomy     cyst removal on pituitary gland    Family History  Problem Relation Age of Onset  . Heart disease Mother   . Hyperlipidemia Mother   . Hypertension Mother   . Stroke Mother   . Cancer Brother     History  Substance Use Topics  . Smoking status: Former Smoker    Types: Cigarettes  . Smokeless tobacco: Former Neurosurgeon    Quit date: 01/11/2008  . Alcohol Use: No    OB History    Grav Para Term Preterm Abortions TAB SAB Ect Mult Living                  Review of Systems All other systems reviewed and are negative except as noted in HPI.  Marland Kitchen   Allergies  Asa; Lidocaine; Nsaids; and Sulfonamide derivatives  Home Medications   Current Outpatient Rx  Name Route Sig  Dispense Refill  . ACETAMINOPHEN 500 MG PO TABS Oral Take 500 mg by mouth every 6 (six) hours as needed.      . ALBUTEROL SULFATE 1.25 MG/3ML IN NEBU Nebulization Take 1 ampule by nebulization every 6 (six) hours as needed.      . ALPRAZOLAM 0.5 MG PO TABS Oral Take 0.5 mg by mouth 3 (three) times daily as needed.      . ANTACID LIQUID PO Oral Take by mouth.      . AMLODIPINE BESYLATE 5 MG PO TABS Oral Take 5 mg by mouth daily.      . OMEGA-3-ACID ETHYL ESTERS 1 G PO CAPS Oral Take 2 g by mouth 2 (two) times daily.      Marland Kitchen OMEPRAZOLE 20 MG PO CPDR Oral Take 20 mg by mouth daily.      Marland Kitchen POLYETHYLENE GLYCOL 3350 PO POWD Oral Take 17 g by mouth daily.      Marland Kitchen ROSUVASTATIN CALCIUM 40 MG PO TABS Oral Take 40 mg by mouth daily.      Marland Kitchen SIMETHICONE 80 MG PO CHEW Oral Chew 80 mg by mouth every 6 (six) hours as needed.      Marland Kitchen TIOTROPIUM BROMIDE MONOHYDRATE 18 MCG IN CAPS  Inhalation Place 18 mcg into inhaler and inhale daily.        BP 119/64  Pulse 57  Temp(Src) 98.1 F (36.7 C) (Oral)  Resp 18  SpO2 96%  Physical Exam  Nursing note and vitals reviewed. Constitutional: She is oriented to person, place, and time. She appears well-developed and well-nourished.  HENT:  Head: Normocephalic and atraumatic.  Eyes: EOM are normal. Pupils are equal, round, and reactive to light.  Neck: Normal range of motion. Neck supple.  Cardiovascular: Normal rate, normal heart sounds and intact distal pulses.   Pulmonary/Chest: Effort normal. She has no wheezes.       Decreased air movement bilaterally, bibasilar rales  Abdominal: Bowel sounds are normal. She exhibits no distension. There is no tenderness.  Musculoskeletal: Normal range of motion. She exhibits edema. She exhibits no tenderness.  Neurological: She is alert and oriented to person, place, and time. She has normal strength. No cranial nerve deficit or sensory deficit.  Skin: Skin is warm and dry. No rash noted.  Psychiatric: She has a normal mood and  affect.    ED Course  Procedures (including critical care time)  Labs Reviewed  CBC - Abnormal; Notable for the following:    RDW 16.1 (*)    All other components within normal limits  PRO B NATRIURETIC PEPTIDE - Abnormal; Notable for the following:    Pro B Natriuretic peptide (BNP) 484.0 (*)    All other components within normal limits  POCT I-STAT, CHEM 8 - Abnormal; Notable for the following:    BUN 41 (*)    Creatinine, Ser 2.00 (*)    All other components within normal limits  DIFFERENTIAL  PROTIME-INR  APTT  POCT I-STAT TROPONIN I   Dg Chest Port 1 View  05/23/2012  *RADIOLOGY REPORT*  Clinical Data: 77 year old female with chest pain and shortness of breath.  PORTABLE CHEST - 1 VIEW  Comparison: [facility] 02/28/2010 and earlier.  Findings: Portable upright AP view at 1333 hours.  Stable lung volumes.  Stable cardiac size and mediastinal contours.  Cardiac size at the upper limits of normal.  No pneumothorax, pulmonary edema, pleural effusion or consolidation.  Mild patchy bibasilar opacity, greater on the right.  IMPRESSION: Mild patchy bibasilar opacity, favor atelectasis, but lung base infection is difficult to exclude. PA and lateral view may clarify when possible.  Original Report Authenticated By: Harley Hallmark, M.D.     No diagnosis found.    MDM   Date: 05/23/2012  Rate: 56  Rhythm: sinus bradycardia  QRS Axis: right  Intervals: normal  ST/T Wave abnormalities: nonspecific T wave changes  Conduction Disutrbances:right bundle branch block  Narrative Interpretation:   Old EKG Reviewed: unchanged  2:38 PM Pt sleeping soundly in room. ASA not given due to listed allergy. Discussed with Dr. Juanetta Gosling who will come evaluate the patient for admission/rule out.         Hasheem Voland B. Bernette Mayers, MD 05/23/12 1438

## 2012-05-23 NOTE — ED Notes (Signed)
Pt a resident of [facility], started having Lt sided chest pain about 40 minutes prior to arrival, has a HX of AAA, also co SOB.

## 2012-05-23 NOTE — H&P (Signed)
[name] M Danziger MRN: [id number] DOB/AGE: [birth year] 77 y.o. Primary Care Physician:Seena Face L, MD, MD Admit date: 05/23/2012 Chief Complaint: Chest pain HPI: This is a 77 year old came to the emergency because of chest discomfort. She lives in a assisted living facility and she says she was outside and developed sharp chest pain that had some pressure associated with it. This was midsternal and did not radiate. She has not had nausea or vomiting or diaphoresis but does have COPD and is been more short of breath. Her pain has now resolved spontaneously. She has a history of coronary artery occlusive disease and an abdominal aortic aneurysm.  Past Medical History  Diagnosis Date  . Hypertension   . Hyperlipidemia   . COPD (chronic obstructive pulmonary disease)   . Arthritis   . Depression with anxiety   . GERD (gastroesophageal reflux disease)   . Hiatal hernia   . Atrial fibrillation   . Productive cough   . Leg pain   . AAA (abdominal aortic aneurysm)   . CAD (coronary artery disease)    Past Surgical History  Procedure Date  . Kidney stone surgery   . Cystectomy     cyst removal on pituitary gland        Family History  Problem Relation Age of Onset  . Heart disease Mother   . Hyperlipidemia Mother   . Hypertension Mother   . Stroke Mother   . Cancer Brother     Social History:  reports that she has quit smoking. Her smoking use included Cigarettes. She quit smokeless tobacco use about 4 years ago. She reports that she does not drink alcohol. Her drug history not on file.   Allergies:  Allergies  Allergen Reactions  . Asa (Aspirin)     "my ears bleed"  . Lidocaine   . Nsaids   . Sulfonamide Derivatives      (Not in a hospital admission)     JXB:JYNWG from the symptoms mentioned above,there are no other symptoms referable to all systems reviewed.  Physical Exam: Blood pressure 118/69, pulse 60, temperature 98.1 F (36.7 C), temperature source Oral,  resp. rate 16, SpO2 95.00%. She is awake and alert and looks chronically sick. Her pupils are reactive. Her mucous membranes are slightly dry. Her neck is supple without masses. Her chest is relatively clear. Her heart is regular without gallop. Her abdomen is soft without masses. Bowel sounds present and active I cannot feel her aortic aneurysm. Her extremities show 1+ edema. She has decreased range of motion of both hips. Her central nervous system examination is grossly intact    Basename 05/23/12 1411 05/23/12 1326  WBC -- 6.7  NEUTROABS -- 3.2  HGB 12.6 12.2  HCT 37.0 39.0  MCV -- 89.0  PLT -- 166    Basename 05/23/12 1411  NA 143  K 4.1  CL 107  CO2 --  GLUCOSE 85  BUN 41*  CREATININE 2.00*  CALCIUM --  MG --  lablast2(ast:2,ALT:2,alkphos:2,bilitot:2,prot:2,albumin:2)@    No results found for this or any previous visit (from the past 240 hour(s)).   Dg Chest Port 1 View  05/23/2012  *RADIOLOGY REPORT*  Clinical Data: 77 year old female with chest pain and shortness of breath.  PORTABLE CHEST - 1 VIEW  Comparison: [facility] 02/28/2010 and earlier.  Findings: Portable upright AP view at 1333 hours.  Stable lung volumes.  Stable cardiac size and mediastinal contours.  Cardiac size at the upper limits of normal.  No pneumothorax, pulmonary  edema, pleural effusion or consolidation.  Mild patchy bibasilar opacity, greater on the right.  IMPRESSION: Mild patchy bibasilar opacity, favor atelectasis, but lung base infection is difficult to exclude. PA and lateral view may clarify when possible.  Original Report Authenticated By: Harley Hallmark, M.D.   Impression: She has chest pain. It has resolved. She has a history of cardiac disease so she'll need to be ruled out for myocardial infarction. She has multiple other medical problems as noted. Active Problems:  * No active hospital problems. *      Plan: She will be started on serial EKGs and cardiac enzymes. I  did not put her on anti-coagulation or nitroglycerin because her  pain is somewhat atypical and she is now pain-free      Zyad Boomer L Pager [phone]  05/23/2012, 3:44 PM

## 2012-05-24 DIAGNOSIS — R079 Chest pain, unspecified: Secondary | ICD-10-CM | POA: Diagnosis not present

## 2012-05-24 DIAGNOSIS — E785 Hyperlipidemia, unspecified: Secondary | ICD-10-CM | POA: Diagnosis present

## 2012-05-24 DIAGNOSIS — M1610 Unilateral primary osteoarthritis, unspecified hip: Secondary | ICD-10-CM | POA: Diagnosis not present

## 2012-05-24 DIAGNOSIS — I2510 Atherosclerotic heart disease of native coronary artery without angina pectoris: Secondary | ICD-10-CM | POA: Diagnosis not present

## 2012-05-24 DIAGNOSIS — R0602 Shortness of breath: Secondary | ICD-10-CM | POA: Diagnosis not present

## 2012-05-24 LAB — BASIC METABOLIC PANEL
CO2: 29 mEq/L (ref 19–32)
Chloride: 102 mEq/L (ref 96–112)
Potassium: 3.7 mEq/L (ref 3.5–5.1)
Sodium: 141 mEq/L (ref 135–145)

## 2012-05-24 LAB — CARDIAC PANEL(CRET KIN+CKTOT+MB+TROPI)
Relative Index: INVALID (ref 0.0–2.5)
Total CK: 77 U/L (ref 7–177)
Total CK: 81 U/L (ref 7–177)

## 2012-05-24 MED ORDER — CEFUROXIME AXETIL 500 MG PO TABS: 500.0000 mg | ORAL_TABLET | Freq: Two times a day (BID) | ORAL | Status: AC

## 2012-05-24 MED ORDER — METHYLPREDNISOLONE 4 MG PO KIT: PACK | ORAL | Status: AC

## 2012-05-24 NOTE — Progress Notes (Signed)
UR Chart Review Completed  

## 2012-05-24 NOTE — Progress Notes (Signed)
Subjective: She feels better. She has no chest pain. She ruled out for myocardial infarction  Objective: Vital signs in last 24 hours: Temp:  [97.4 F (36.3 C)-98.1 F (36.7 C)] 97.4 F (36.3 C) (06/03 0513) Pulse Rate:  [53-61] 53  (06/03 0513) Resp:  [16-18] 18  (06/03 0513) BP: (106-135)/(57-83) 106/57 mmHg (06/03 0513) SpO2:  [94 %-96 %] 94 % (06/03 0513) Weight:  [90.4 kg (199 lb 4.7 oz)-91.4 kg (201 lb 8 oz)] 90.4 kg (199 lb 4.7 oz) (06/03 0513) Weight change:     Intake/Output from previous day: 06/02 0701 - 06/03 0700 In: 480 [P.O.:480] Out: 550 [Urine:550]  PHYSICAL EXAM General appearance: alert, cooperative and no distress Resp: clear to auscultation bilaterally Cardio: regular rate and rhythm, S1, S2 normal, no murmur, click, rub or gallop GI: soft, non-tender; bowel sounds normal; no masses,  no organomegaly Extremities: extremities normal, atraumatic, no cyanosis or edema  Lab Results:    Basic Metabolic Panel:  Basename 05/24/12 0114 05/23/12 1720 05/23/12 1411  NA 141 -- 143  K 3.7 -- 4.1  CL 102 -- 107  CO2 29 -- --  GLUCOSE 136* -- 85  BUN 39* -- 41*  CREATININE 1.90* 1.79* --  CALCIUM 9.1 -- --  MG -- -- --  PHOS -- -- --   Liver Function Tests: No results found for this basename: AST:2,ALT:2,ALKPHOS:2,BILITOT:2,PROT:2,ALBUMIN:2 in the last 72 hours No results found for this basename: LIPASE:2,AMYLASE:2 in the last 72 hours No results found for this basename: AMMONIA:2 in the last 72 hours CBC:  Basename 05/23/12 1411 05/23/12 1326  WBC -- 6.7  NEUTROABS -- 3.2  HGB 12.6 12.2  HCT 37.0 39.0  MCV -- 89.0  PLT -- 166   Cardiac Enzymes:  Basename 05/24/12 0114 05/23/12 1720  CKTOTAL 77 86  CKMB 1.6 1.7  CKMBINDEX -- --  TROPONINI <0.30 <0.30   BNP:  Basename 05/23/12 1326  PROBNP 484.0*   D-Dimer: No results found for this basename: DDIMER:2 in the last 72 hours CBG: No results found for this basename: GLUCAP:6 in the last  72 hours Hemoglobin A1C: No results found for this basename: HGBA1C in the last 72 hours Fasting Lipid Panel: No results found for this basename: CHOL,HDL,LDLCALC,TRIG,CHOLHDL,LDLDIRECT in the last 72 hours Thyroid Function Tests: No results found for this basename: TSH,T4TOTAL,FREET4,T3FREE,THYROIDAB in the last 72 hours Anemia Panel: No results found for this basename: VITAMINB12,FOLATE,FERRITIN,TIBC,IRON,RETICCTPCT in the last 72 hours Coagulation:  Basename 05/23/12 1326  LABPROT 13.7  INR 1.03   Urine Drug Screen: Drugs of Abuse  No results found for this basename: labopia, cocainscrnur, labbenz, amphetmu, thcu, labbarb    Alcohol Level: No results found for this basename: ETH:2 in the last 72 hours Urinalysis: No results found for this basename: COLORURINE:2,APPERANCEUR:2,LABSPEC:2,PHURINE:2,GLUCOSEU:2,HGBUR:2,BILIRUBINUR:2,KETONESUR:2,PROTEINUR:2,UROBILINOGEN:2,NITRITE:2,LEUKOCYTESUR:2 in the last 72 hours Misc. Labs:  ABGS  Basename 05/23/12 1411  PHART --  PO2ART --  TCO2 32  HCO3 --   CULTURES No results found for this or any previous visit (from the past 240 hour(s)). Studies/Results: Dg Chest Port 1 View  05/23/2012  *RADIOLOGY REPORT*  Clinical Data: 77 year old female with chest pain and shortness of breath.  PORTABLE CHEST - 1 VIEW  Comparison: [facility] 02/28/2010 and earlier.  Findings: Portable upright AP view at 1333 hours.  Stable lung volumes.  Stable cardiac size and mediastinal contours.  Cardiac size at the upper limits of normal.  No pneumothorax, pulmonary edema, pleural effusion or consolidation.  Mild patchy bibasilar opacity, greater  on the right.  IMPRESSION: Mild patchy bibasilar opacity, favor atelectasis, but lung base infection is difficult to exclude. PA and lateral view may clarify when possible.  Original Report Authenticated By: Harley Hallmark, M.D.    Medications:  Prior to Admission:  Prescriptions prior to admission    Medication Sig Dispense Refill  . ALPRAZolam (XANAX) 1 MG tablet Take 1 mg by mouth 3 (three) times daily.      Marland Kitchen amLODipine (NORVASC) 2.5 MG tablet Take 2.5 mg by mouth daily.      . Choline Fenofibrate (TRILIPIX) 135 MG capsule Take 135 mg by mouth daily.      . diclofenac sodium (VOLTAREN) 1 % GEL Apply 1 application topically 4 (four) times daily.      Marland Kitchen HYDROcodone-acetaminophen (NORCO) 10-325 MG per tablet Take 1 tablet by mouth 3 (three) times daily.      . nebivolol (BYSTOLIC) 5 MG tablet Take 5 mg by mouth daily.      Marland Kitchen omega-3 acid ethyl esters (LOVAZA) 1 G capsule Take 1 g by mouth 2 (two) times daily.       Marland Kitchen omeprazole (PRILOSEC) 20 MG capsule Take 20 mg by mouth daily.        . rosuvastatin (CRESTOR) 40 MG tablet Take 40 mg by mouth daily.        . simethicone (MYLICON) 80 MG chewable tablet Chew 80 mg by mouth 3 (three) times daily.       Marland Kitchen tiotropium (SPIRIVA HANDIHALER) 18 MCG inhalation capsule Place 18 mcg into inhaler and inhale daily.        Marland Kitchen torsemide (DEMADEX) 20 MG tablet Take 20 mg by mouth 2 (two) times daily.      Marland Kitchen acetaminophen (TYLENOL) 500 MG tablet Take 500 mg by mouth every 6 (six) hours as needed.        Marland Kitchen albuterol (ACCUNEB) 1.25 MG/3ML nebulizer solution Take 1 ampule by nebulization every 4 (four) hours as needed. Shortness of breath      . Alum & Mag Hydroxide-Simeth (ANTACID LIQUID PO) Take 30 mLs by mouth every 2 (two) hours as needed. Heartburn       Scheduled:   . ALPRAZolam  1 mg Oral TID  . amLODipine  2.5 mg Oral Daily  . atorvastatin  80 mg Oral q1800  . enoxaparin  30 mg Subcutaneous Q24H  . fenofibrate  160 mg Oral Daily  . HYDROcodone-acetaminophen  1 tablet Oral TID  . nebivolol  5 mg Oral Daily  . omega-3 acid ethyl esters  1 g Oral BID  . pantoprazole  40 mg Oral Q1200  . simethicone  80 mg Oral TID  . sodium chloride  3 mL Intravenous Q12H  . tiotropium  18 mcg Inhalation Daily  . torsemide  20 mg Oral BID   Continuous:   ZOX:WRUEAV chloride, acetaminophen, acetaminophen, albuterol, morphine injection, nitroGLYCERIN, oxyCODONE, sodium chloride  Assesment: She was admitted with chest pain. She ruled out for MI. She has severe COPD and I think her chest pain was probably more of a COPD exacerbation. There is some question as to whether she has an infiltrate on chest x-ray as well. However she is awake and alert in no distress looks very comfortable and I think she can go back to her assisted living facility for outpatient treatment. Active Problems:  * No active hospital problems. *     Plan: Discharge back to her assisted living facility    LOS: 1 day  Adeoluwa Silvers L 05/24/2012, 8:40 AM

## 2012-05-24 NOTE — Discharge Summary (Signed)
Physician Discharge Summary  Patient ID: [name] M Bickle MRN: [id number] DOB/AGE: May 12, 1935 77 y.o. Primary Care Physician:Oather Muilenburg L, MD, MD Admit date: 05/23/2012 Discharge date: 05/24/2012    Discharge Diagnoses:   Principal Problem:  *Chest pain Active Problems:  ARTHRITIS, HIPS, BILATERAL  AAA (abdominal aortic aneurysm)  Coronary atherosclerosis  COPD (chronic obstructive pulmonary disease)  Chronic renal failure  Hypertension  Hyperlipidemia   Medication List  As of 05/24/2012  8:46 AM   TAKE these medications         acetaminophen 500 MG tablet   Commonly known as: TYLENOL   Take 500 mg by mouth every 6 (six) hours as needed.      albuterol 1.25 MG/3ML nebulizer solution   Commonly known as: ACCUNEB   Take 1 ampule by nebulization every 4 (four) hours as needed. Shortness of breath      ALPRAZolam 1 MG tablet   Commonly known as: XANAX   Take 1 mg by mouth 3 (three) times daily.      amLODipine 2.5 MG tablet   Commonly known as: NORVASC   Take 2.5 mg by mouth daily.      ANTACID LIQUID PO   Take 30 mLs by mouth every 2 (two) hours as needed. Heartburn      cefUROXime 500 MG tablet   Commonly known as: CEFTIN   Take 1 tablet (500 mg total) by mouth 2 (two) times daily.      diclofenac sodium 1 % Gel   Commonly known as: VOLTAREN   Apply 1 application topically 4 (four) times daily.      HYDROcodone-acetaminophen 10-325 MG per tablet   Commonly known as: NORCO   Take 1 tablet by mouth 3 (three) times daily.      methylPREDNISolone 4 MG tablet   Commonly known as: MEDROL DOSEPAK   follow package directions      nebivolol 5 MG tablet   Commonly known as: BYSTOLIC   Take 5 mg by mouth daily.      omega-3 acid ethyl esters 1 G capsule   Commonly known as: LOVAZA   Take 1 g by mouth 2 (two) times daily.      omeprazole 20 MG capsule   Commonly known as: PRILOSEC   Take 20 mg by mouth daily.      rosuvastatin 40 MG tablet   Commonly known as:  CRESTOR   Take 40 mg by mouth daily.      simethicone 80 MG chewable tablet   Commonly known as: MYLICON   Chew 80 mg by mouth 3 (three) times daily.      SPIRIVA HANDIHALER 18 MCG inhalation capsule   Generic drug: tiotropium   Place 18 mcg into inhaler and inhale daily.      torsemide 20 MG tablet   Commonly known as: DEMADEX   Take 20 mg by mouth 2 (two) times daily.      TRILIPIX 135 MG capsule   Generic drug: Choline Fenofibrate   Take 135 mg by mouth daily.            Discharged Condition: Improved    Consults: None  Significant Diagnostic Studies: Dg Chest Port 1 View  05/23/2012  *RADIOLOGY REPORT*  Clinical Data: 77 year old female with chest pain and shortness of breath.  PORTABLE CHEST - 1 VIEW  Comparison: [facility] 02/28/2010 and earlier.  Findings: Portable upright AP view at 1333 hours.  Stable lung volumes.  Stable cardiac size and mediastinal contours.  Cardiac size at the upper limits of normal.  No pneumothorax, pulmonary edema, pleural effusion or consolidation.  Mild patchy bibasilar opacity, greater on the right.  IMPRESSION: Mild patchy bibasilar opacity, favor atelectasis, but lung base infection is difficult to exclude. PA and lateral view may clarify when possible.  Original Report Authenticated By: Harley Hallmark, M.D.    Lab Results: Basic Metabolic Panel:  Basename 05/24/12 0114 05/23/12 1720 05/23/12 1411  NA 141 -- 143  K 3.7 -- 4.1  CL 102 -- 107  CO2 29 -- --  GLUCOSE 136* -- 85  BUN 39* -- 41*  CREATININE 1.90* 1.79* --  CALCIUM 9.1 -- --  MG -- -- --  PHOS -- -- --   Liver Function Tests: No results found for this basename: AST:2,ALT:2,ALKPHOS:2,BILITOT:2,PROT:2,ALBUMIN:2 in the last 72 hours   CBC:  Basename 05/23/12 1411 05/23/12 1326  WBC -- 6.7  NEUTROABS -- 3.2  HGB 12.6 12.2  HCT 37.0 39.0  MCV -- 89.0  PLT -- 166    No results found for this or any previous visit (from the past 240 hour(s)).    Hospital Course: She came to the emergency with chest pain. She ruled out for myocardial infarction with serial EKGs and cardiac enzymes. She has a long known history of COPD and it was felt that her chest pain may be more related to that because it was atypical. After she ruled out for myocardial infarction she looks very comfortable. She had no further chest pain.  Discharge Exam: Blood pressure 106/57, pulse 53, temperature 97.4 F (36.3 C), temperature source Oral, resp. rate 18, height 5\' 5"  (1.651 m), weight 90.4 kg (199 lb 4.7 oz), SpO2 94.00%. She is awake and alert. She looks comfortable. Her chest shows rhonchi bilaterally. Her heart is regular  Disposition: Discharge back to her assisted living facility she will need followup in my office and with her cardiologist.  Discharge Orders    Future Orders Please Complete By Expires   Discharge patient           Signed: Fredirick Maudlin Pager [phone]  05/24/2012, 8:46 AM

## 2012-05-24 NOTE — Clinical Social Work Note (Signed)
Pt to discharge today and return to [facility].  Talked w Carred at [facility] to let her know of discharge, facility agreeable to discharge and does not need FL2 updated.  Facility will transport patient back to their facility this morning.  Spoke w patient's RN and let her know of facility's plans.  Left VM for daughter, Clent Ridges, to inform her of plan.  Clovis Cao Clinical Social Worker [phone])

## 2012-05-24 NOTE — Progress Notes (Signed)
Prescriptions,discharge summary and instructions given to [employer].

## 2012-05-27 DIAGNOSIS — E785 Hyperlipidemia, unspecified: Secondary | ICD-10-CM | POA: Diagnosis not present

## 2012-05-27 DIAGNOSIS — I10 Essential (primary) hypertension: Secondary | ICD-10-CM | POA: Diagnosis not present

## 2012-05-27 DIAGNOSIS — I509 Heart failure, unspecified: Secondary | ICD-10-CM | POA: Diagnosis not present

## 2012-05-27 DIAGNOSIS — J449 Chronic obstructive pulmonary disease, unspecified: Secondary | ICD-10-CM | POA: Diagnosis not present

## 2012-07-15 DIAGNOSIS — R609 Edema, unspecified: Secondary | ICD-10-CM | POA: Diagnosis not present

## 2012-07-15 DIAGNOSIS — E782 Mixed hyperlipidemia: Secondary | ICD-10-CM | POA: Diagnosis not present

## 2012-07-15 DIAGNOSIS — I2510 Atherosclerotic heart disease of native coronary artery without angina pectoris: Secondary | ICD-10-CM | POA: Diagnosis not present

## 2012-07-15 DIAGNOSIS — N289 Disorder of kidney and ureter, unspecified: Secondary | ICD-10-CM | POA: Diagnosis not present

## 2012-07-27 DIAGNOSIS — J449 Chronic obstructive pulmonary disease, unspecified: Secondary | ICD-10-CM | POA: Diagnosis not present

## 2012-07-27 DIAGNOSIS — I509 Heart failure, unspecified: Secondary | ICD-10-CM | POA: Diagnosis not present

## 2012-07-27 DIAGNOSIS — E785 Hyperlipidemia, unspecified: Secondary | ICD-10-CM | POA: Diagnosis not present

## 2012-07-27 DIAGNOSIS — I10 Essential (primary) hypertension: Secondary | ICD-10-CM | POA: Diagnosis not present

## 2012-08-03 ENCOUNTER — Encounter: Payer: Self-pay | Admitting: Vascular Surgery

## 2012-08-03 DIAGNOSIS — I10 Essential (primary) hypertension: Secondary | ICD-10-CM | POA: Diagnosis not present

## 2012-08-03 DIAGNOSIS — I714 Abdominal aortic aneurysm, without rupture: Secondary | ICD-10-CM | POA: Diagnosis not present

## 2012-08-04 ENCOUNTER — Ambulatory Visit
Admission: RE | Admit: 2012-08-04 | Discharge: 2012-08-04 | Disposition: A | Discharge status: Home / Self Care | Payer: Medicare Other | Source: Ambulatory Visit | Attending: Vascular Surgery | Admitting: Vascular Surgery

## 2012-08-04 ENCOUNTER — Encounter: Payer: Self-pay | Admitting: Vascular Surgery

## 2012-08-04 ENCOUNTER — Ambulatory Visit (INDEPENDENT_AMBULATORY_CARE_PROVIDER_SITE_OTHER): Payer: Medicare Other | Admitting: Vascular Surgery

## 2012-08-04 ENCOUNTER — Other Ambulatory Visit: Payer: Self-pay | Admitting: *Deleted

## 2012-08-04 VITALS — BP 123/79 | HR 77 | Resp 18 | Ht 64.0 in | Wt 183.6 lb

## 2012-08-04 DIAGNOSIS — M1990 Unspecified osteoarthritis, unspecified site: Secondary | ICD-10-CM | POA: Diagnosis not present

## 2012-08-04 DIAGNOSIS — I714 Abdominal aortic aneurysm, without rupture: Secondary | ICD-10-CM

## 2012-08-04 DIAGNOSIS — D352 Benign neoplasm of pituitary gland: Secondary | ICD-10-CM | POA: Diagnosis not present

## 2012-08-04 DIAGNOSIS — I10 Essential (primary) hypertension: Secondary | ICD-10-CM | POA: Diagnosis not present

## 2012-08-04 DIAGNOSIS — K8020 Calculus of gallbladder without cholecystitis without obstruction: Secondary | ICD-10-CM | POA: Diagnosis not present

## 2012-08-04 DIAGNOSIS — R609 Edema, unspecified: Secondary | ICD-10-CM | POA: Diagnosis not present

## 2012-08-04 DIAGNOSIS — N189 Chronic kidney disease, unspecified: Secondary | ICD-10-CM

## 2012-08-04 DIAGNOSIS — M6281 Muscle weakness (generalized): Secondary | ICD-10-CM | POA: Diagnosis not present

## 2012-08-04 DIAGNOSIS — R7989 Other specified abnormal findings of blood chemistry: Secondary | ICD-10-CM | POA: Diagnosis not present

## 2012-08-04 DIAGNOSIS — I2510 Atherosclerotic heart disease of native coronary artery without angina pectoris: Secondary | ICD-10-CM | POA: Diagnosis not present

## 2012-08-04 DIAGNOSIS — J449 Chronic obstructive pulmonary disease, unspecified: Secondary | ICD-10-CM | POA: Diagnosis not present

## 2012-08-04 DIAGNOSIS — N19 Unspecified kidney failure: Secondary | ICD-10-CM | POA: Diagnosis not present

## 2012-08-04 DIAGNOSIS — M159 Polyosteoarthritis, unspecified: Secondary | ICD-10-CM | POA: Diagnosis not present

## 2012-08-04 DIAGNOSIS — D353 Benign neoplasm of craniopharyngeal duct: Secondary | ICD-10-CM | POA: Diagnosis not present

## 2012-08-04 DIAGNOSIS — N200 Calculus of kidney: Secondary | ICD-10-CM | POA: Diagnosis not present

## 2012-08-04 NOTE — Assessment & Plan Note (Signed)
She was scheduled for a CT scan with IV contrast but the scan was done without contrast because is noted to have a markedly elevated creatinine of 5.09. She may have some worsening of her renal insufficiency and I've encouraged her to follow up with her primary care physician.

## 2012-08-04 NOTE — Assessment & Plan Note (Signed)
Her aneurysm is stable at 5.3 cm. She has evidence of renal insufficiency and the CT scan was done without contrast. I've explained that in a normal risk patient we would consider elective repair at 5.5 cm. However she is markedly debilitated and would be at higher-risk for surgery. She does have significant calcific disease of the iliac arteries bilaterally and also of the infrarenal neck which might prohibit placement of a stent graft. Regardless, this point the aneurysm is not large enough to consider elective repair. I have ordered a follow up ultrasound in 6 months and I'll see her back at that time.

## 2012-08-04 NOTE — Progress Notes (Signed)
[facility] [place]  Patient name: [name] [name] MRN: [id number] DOB: 21-Sep-1935 Sex: female  REASON FOR VISIT: follow up of abdominal aortic aneurysm.  HPI: [name] [name] is a 77 y.o. female who I have been following with an abdominal aortic aneurysm. I last saw in December of 2012 when the aneurysm measured 5.2 cm in maximum diameter. She comes in for a 6 month follow up study. She denies any significant abdominal pain or back pain. She does state that she is sore all over. Her activity is been very limited and because of some chronic hip pain she has not been ambulating for the last few months. She's been told she was too high risk for hip surgery.  Past Medical History  Diagnosis Date  . Hypertension   . Hyperlipidemia   . COPD (chronic obstructive pulmonary disease)   . Arthritis   . Depression with anxiety   . GERD (gastroesophageal reflux disease)   . Hiatal hernia   . Atrial fibrillation   . Productive cough   . Leg pain   . AAA (abdominal aortic aneurysm)   . CAD (coronary artery disease)   . Chronic kidney disease     Family History  Problem Relation Age of Onset  . Heart disease Mother   . Hyperlipidemia Mother   . Hypertension Mother   . Stroke Mother   . Cancer Brother     SOCIAL HISTORY: History  Substance Use Topics  . Smoking status: Former Smoker    Types: Cigarettes  . Smokeless tobacco: Former Neurosurgeon    Quit date: 01/11/2008  . Alcohol Use: No    Allergies  Allergen Reactions  . Asa (Aspirin)     "my ears bleed"  . Lidocaine   . Nsaids   . Sulfonamide Derivatives     Current Outpatient Prescriptions  Medication Sig Dispense Refill  . acetaminophen (TYLENOL) 500 MG tablet Take 500 mg by mouth every 6 (six) hours as needed.        Marland Kitchen albuterol (ACCUNEB) 1.25 MG/3ML nebulizer solution Take 1 ampule by nebulization every 4 (four) hours as needed. Shortness of breath      . ALPRAZolam (XANAX) 1 MG tablet Take 1 mg by mouth  3 (three) times daily.      . Alum & Mag Hydroxide-Simeth (ANTACID LIQUID PO) Take 30 mLs by mouth every 2 (two) hours as needed. Heartburn      . amLODipine (NORVASC) 2.5 MG tablet Take 2.5 mg by mouth daily.      . Choline Fenofibrate (TRILIPIX) 135 MG capsule Take 135 mg by mouth daily.      . diclofenac sodium (VOLTAREN) 1 % GEL Apply 1 application topically 4 (four) times daily.      Marland Kitchen HYDROcodone-acetaminophen (NORCO) 10-325 MG per tablet Take 1 tablet by mouth 3 (three) times daily.      . nebivolol (BYSTOLIC) 5 MG tablet Take 5 mg by mouth daily.      Marland Kitchen omega-3 acid ethyl esters (LOVAZA) 1 G capsule Take 1 g by mouth 2 (two) times daily.       Marland Kitchen omeprazole (PRILOSEC) 20 MG capsule Take 20 mg by mouth daily.        . rosuvastatin (CRESTOR) 40 MG tablet Take 40 mg by mouth daily.        . simethicone (MYLICON) 80 MG chewable tablet Chew 80 mg by mouth 3 (three) times daily.       Marland Kitchen tiotropium (SPIRIVA  HANDIHALER) 18 MCG inhalation capsule Place 18 mcg into inhaler and inhale daily.        Marland Kitchen torsemide (DEMADEX) 20 MG tablet Take 20 mg by mouth 2 (two) times daily.        REVIEW OF SYSTEMS: Arly.Keller ] denotes positive finding; [  ] denotes negative finding  CARDIOVASCULAR:  [ ]  chest pain   [ ]  chest pressure   [ ]  palpitations   [ ]  orthopnea   Arly.Keller ] dyspnea on exertion   [ ]  claudication   [ ]  rest pain   [ ]  DVT   [ ]  phlebitis PULMONARY:   [ ]  productive cough   [ ]  asthma   [ ]  wheezing NEUROLOGIC:   [ ]  weakness  [ ]  paresthesias  [ ]  aphasia  [ ]  amaurosis  [ ]  dizziness HEMATOLOGIC:   [ ]  bleeding problems   [ ]  clotting disorders MUSCULOSKELETAL:  [ ]  joint pain   [ ]  joint swelling [ ]  leg swelling GASTROINTESTINAL: [ ]   blood in stool  [ ]   hematemesis GENITOURINARY:  [ ]   dysuria  [ ]   hematuria PSYCHIATRIC:  [ ]  history of major depression INTEGUMENTARY:  [ ]  rashes  [ ]  ulcers CONSTITUTIONAL:  [ ]  fever   [ ]  chills  PHYSICAL EXAM: Filed Vitals:   08/04/12 1339  BP: 123/79    Pulse: 77  Resp: 18  Height: 5\' 4"  (1.626 m)  Weight: 183 lb 9.6 oz (83.28 kg)   Body mass index is 31.51 kg/(m^2). GENERAL: The patient is a well-nourished female, in no acute distress. The vital signs are documented above. CARDIOVASCULAR: There is a regular rate and rhythm without significant murmur appreciated. I do not detect carotid bruits. She has palpable femoral pulses although slightly diminished. Both feet are warm and well-perfused. She has mild bilateral lower extremity swelling. PULMONARY: There is good air exchange bilaterally without wheezing or rales. ABDOMEN: Soft and non-tender with normal pitched bowel sounds. I am unable to palpate her aneurysm because of her obesity. MUSCULOSKELETAL: There are no major deformities or cyanosis. NEUROLOGIC: No focal weakness or paresthesias are detected. SKIN: There are no ulcers or rashes noted. PSYCHIATRIC: The patient has a normal affect.  DATA:  I have interpreted her CT scan of the abdomen which shows that the maximum diameter is 5.3 cm in size not changed significantly over the last 6 months.  MEDICAL ISSUES:  AAA (abdominal aortic aneurysm) Her aneurysm is stable at 5.3 cm. She has evidence of renal insufficiency and the CT scan was done without contrast. I've explained that in a normal risk patient we would consider elective repair at 5.5 cm. However she is markedly debilitated and would be at higher-risk for surgery. She does have significant calcific disease of the iliac arteries bilaterally and also of the infrarenal neck which might prohibit placement of a stent graft. Regardless, this point the aneurysm is not large enough to consider elective repair. I have ordered a follow up ultrasound in 6 months and I'll see her back at that time.  Chronic renal failure She was scheduled for a CT scan with IV contrast but the scan was done without contrast because is noted to have a markedly elevated creatinine of 5.09. She may have some  worsening of her renal insufficiency and I've encouraged her to follow up with her primary care physician.   DICKSON,CHRISTOPHER S [facility] [place] Beeper: [phone]

## 2012-08-05 DIAGNOSIS — J449 Chronic obstructive pulmonary disease, unspecified: Secondary | ICD-10-CM | POA: Diagnosis not present

## 2012-08-05 DIAGNOSIS — R7989 Other specified abnormal findings of blood chemistry: Secondary | ICD-10-CM | POA: Diagnosis not present

## 2012-08-05 DIAGNOSIS — I2510 Atherosclerotic heart disease of native coronary artery without angina pectoris: Secondary | ICD-10-CM | POA: Diagnosis not present

## 2012-08-05 DIAGNOSIS — M159 Polyosteoarthritis, unspecified: Secondary | ICD-10-CM | POA: Diagnosis not present

## 2012-08-05 DIAGNOSIS — I10 Essential (primary) hypertension: Secondary | ICD-10-CM | POA: Diagnosis not present

## 2012-08-05 DIAGNOSIS — R609 Edema, unspecified: Secondary | ICD-10-CM | POA: Diagnosis not present

## 2012-08-06 ENCOUNTER — Inpatient Hospital Stay (HOSPITAL_COMMUNITY)
Admission: AD | Admit: 2012-08-06 | Discharge: 2012-08-20 | DRG: 388 | Disposition: A | Discharge status: Skilled Nursing Facility | Payer: Medicare Other | Source: Other Acute Inpatient Hospital | Attending: Internal Medicine | Admitting: Internal Medicine

## 2012-08-06 ENCOUNTER — Encounter (HOSPITAL_COMMUNITY): Payer: Self-pay | Admitting: Internal Medicine

## 2012-08-06 DIAGNOSIS — I498 Other specified cardiac arrhythmias: Secondary | ICD-10-CM | POA: Diagnosis not present

## 2012-08-06 DIAGNOSIS — R079 Chest pain, unspecified: Secondary | ICD-10-CM

## 2012-08-06 DIAGNOSIS — J449 Chronic obstructive pulmonary disease, unspecified: Secondary | ICD-10-CM | POA: Diagnosis not present

## 2012-08-06 DIAGNOSIS — E785 Hyperlipidemia, unspecified: Secondary | ICD-10-CM | POA: Diagnosis not present

## 2012-08-06 DIAGNOSIS — M1610 Unilateral primary osteoarthritis, unspecified hip: Secondary | ICD-10-CM

## 2012-08-06 DIAGNOSIS — F3289 Other specified depressive episodes: Secondary | ICD-10-CM | POA: Diagnosis not present

## 2012-08-06 DIAGNOSIS — K449 Diaphragmatic hernia without obstruction or gangrene: Secondary | ICD-10-CM | POA: Diagnosis present

## 2012-08-06 DIAGNOSIS — I10 Essential (primary) hypertension: Secondary | ICD-10-CM

## 2012-08-06 DIAGNOSIS — E876 Hypokalemia: Secondary | ICD-10-CM | POA: Diagnosis not present

## 2012-08-06 DIAGNOSIS — R404 Transient alteration of awareness: Secondary | ICD-10-CM | POA: Diagnosis not present

## 2012-08-06 DIAGNOSIS — N189 Chronic kidney disease, unspecified: Secondary | ICD-10-CM

## 2012-08-06 DIAGNOSIS — K8020 Calculus of gallbladder without cholecystitis without obstruction: Secondary | ICD-10-CM | POA: Diagnosis not present

## 2012-08-06 DIAGNOSIS — N184 Chronic kidney disease, stage 4 (severe): Secondary | ICD-10-CM | POA: Diagnosis present

## 2012-08-06 DIAGNOSIS — K56609 Unspecified intestinal obstruction, unspecified as to partial versus complete obstruction: Secondary | ICD-10-CM | POA: Diagnosis present

## 2012-08-06 DIAGNOSIS — R1310 Dysphagia, unspecified: Secondary | ICD-10-CM

## 2012-08-06 DIAGNOSIS — G9340 Encephalopathy, unspecified: Secondary | ICD-10-CM | POA: Diagnosis not present

## 2012-08-06 DIAGNOSIS — I714 Abdominal aortic aneurysm, without rupture, unspecified: Secondary | ICD-10-CM | POA: Diagnosis not present

## 2012-08-06 DIAGNOSIS — K219 Gastro-esophageal reflux disease without esophagitis: Secondary | ICD-10-CM | POA: Diagnosis present

## 2012-08-06 DIAGNOSIS — I6789 Other cerebrovascular disease: Secondary | ICD-10-CM | POA: Diagnosis not present

## 2012-08-06 DIAGNOSIS — I129 Hypertensive chronic kidney disease with stage 1 through stage 4 chronic kidney disease, or unspecified chronic kidney disease: Secondary | ICD-10-CM | POA: Diagnosis present

## 2012-08-06 DIAGNOSIS — E870 Hyperosmolality and hypernatremia: Secondary | ICD-10-CM | POA: Diagnosis not present

## 2012-08-06 DIAGNOSIS — Z87891 Personal history of nicotine dependence: Secondary | ICD-10-CM | POA: Diagnosis not present

## 2012-08-06 DIAGNOSIS — I2510 Atherosclerotic heart disease of native coronary artery without angina pectoris: Secondary | ICD-10-CM

## 2012-08-06 DIAGNOSIS — N179 Acute kidney failure, unspecified: Secondary | ICD-10-CM | POA: Diagnosis not present

## 2012-08-06 DIAGNOSIS — F32A Depression, unspecified: Secondary | ICD-10-CM

## 2012-08-06 DIAGNOSIS — M159 Polyosteoarthritis, unspecified: Secondary | ICD-10-CM | POA: Diagnosis not present

## 2012-08-06 DIAGNOSIS — K560 Paralytic ileus: Secondary | ICD-10-CM | POA: Diagnosis not present

## 2012-08-06 DIAGNOSIS — R609 Edema, unspecified: Secondary | ICD-10-CM | POA: Diagnosis not present

## 2012-08-06 DIAGNOSIS — R110 Nausea: Secondary | ICD-10-CM | POA: Diagnosis not present

## 2012-08-06 DIAGNOSIS — K567 Ileus, unspecified: Secondary | ICD-10-CM

## 2012-08-06 DIAGNOSIS — I4891 Unspecified atrial fibrillation: Secondary | ICD-10-CM | POA: Diagnosis present

## 2012-08-06 DIAGNOSIS — Z79899 Other long term (current) drug therapy: Secondary | ICD-10-CM

## 2012-08-06 DIAGNOSIS — K6389 Other specified diseases of intestine: Secondary | ICD-10-CM | POA: Diagnosis not present

## 2012-08-06 DIAGNOSIS — N12 Tubulo-interstitial nephritis, not specified as acute or chronic: Secondary | ICD-10-CM | POA: Diagnosis not present

## 2012-08-06 DIAGNOSIS — E860 Dehydration: Secondary | ICD-10-CM | POA: Diagnosis not present

## 2012-08-06 DIAGNOSIS — I2699 Other pulmonary embolism without acute cor pulmonale: Secondary | ICD-10-CM | POA: Diagnosis not present

## 2012-08-06 DIAGNOSIS — D72829 Elevated white blood cell count, unspecified: Secondary | ICD-10-CM | POA: Diagnosis not present

## 2012-08-06 DIAGNOSIS — R7989 Other specified abnormal findings of blood chemistry: Secondary | ICD-10-CM | POA: Diagnosis not present

## 2012-08-06 DIAGNOSIS — R109 Unspecified abdominal pain: Secondary | ICD-10-CM | POA: Diagnosis not present

## 2012-08-06 DIAGNOSIS — J4489 Other specified chronic obstructive pulmonary disease: Secondary | ICD-10-CM | POA: Diagnosis not present

## 2012-08-06 DIAGNOSIS — I959 Hypotension, unspecified: Secondary | ICD-10-CM | POA: Diagnosis not present

## 2012-08-06 DIAGNOSIS — F411 Generalized anxiety disorder: Secondary | ICD-10-CM | POA: Diagnosis not present

## 2012-08-06 DIAGNOSIS — D649 Anemia, unspecified: Secondary | ICD-10-CM | POA: Diagnosis not present

## 2012-08-06 DIAGNOSIS — N390 Urinary tract infection, site not specified: Secondary | ICD-10-CM

## 2012-08-06 DIAGNOSIS — R141 Gas pain: Secondary | ICD-10-CM | POA: Diagnosis not present

## 2012-08-06 DIAGNOSIS — N289 Disorder of kidney and ureter, unspecified: Secondary | ICD-10-CM | POA: Diagnosis not present

## 2012-08-06 DIAGNOSIS — F329 Major depressive disorder, single episode, unspecified: Secondary | ICD-10-CM | POA: Diagnosis present

## 2012-08-06 DIAGNOSIS — R918 Other nonspecific abnormal finding of lung field: Secondary | ICD-10-CM | POA: Diagnosis not present

## 2012-08-06 DIAGNOSIS — I4510 Unspecified right bundle-branch block: Secondary | ICD-10-CM | POA: Diagnosis present

## 2012-08-06 DIAGNOSIS — G319 Degenerative disease of nervous system, unspecified: Secondary | ICD-10-CM | POA: Diagnosis not present

## 2012-08-06 DIAGNOSIS — M129 Arthropathy, unspecified: Secondary | ICD-10-CM | POA: Diagnosis not present

## 2012-08-06 DIAGNOSIS — Z09 Encounter for follow-up examination after completed treatment for conditions other than malignant neoplasm: Secondary | ICD-10-CM | POA: Diagnosis not present

## 2012-08-06 DIAGNOSIS — Z5189 Encounter for other specified aftercare: Secondary | ICD-10-CM | POA: Diagnosis not present

## 2012-08-06 DIAGNOSIS — N200 Calculus of kidney: Secondary | ICD-10-CM | POA: Diagnosis not present

## 2012-08-06 LAB — COMPREHENSIVE METABOLIC PANEL
ALT: 76 U/L — ABNORMAL HIGH (ref 0–35)
AST: 266 U/L — ABNORMAL HIGH (ref 0–37)
Albumin: 1.9 g/dL — ABNORMAL LOW (ref 3.5–5.2)
Alkaline Phosphatase: 98 U/L (ref 39–117)
Chloride: 88 mEq/L — ABNORMAL LOW (ref 96–112)
Potassium: 3.7 mEq/L (ref 3.5–5.1)
Sodium: 133 mEq/L — ABNORMAL LOW (ref 135–145)
Total Protein: 6.6 g/dL (ref 6.0–8.3)

## 2012-08-06 LAB — CBC WITH DIFFERENTIAL/PLATELET
Band Neutrophils: 0 % (ref 0–10)
Blasts: 0 %
HCT: 33.9 % — ABNORMAL LOW (ref 36.0–46.0)
Lymphocytes Relative: 18 % (ref 12–46)
MCHC: 34.2 g/dL (ref 30.0–36.0)
MCV: 81.9 fL (ref 78.0–100.0)
Metamyelocytes Relative: 0 %
Platelets: 199 10*3/uL (ref 150–400)
Promyelocytes Absolute: 0 %
RDW: 15.4 % (ref 11.5–15.5)
WBC: 5.7 10*3/uL (ref 4.0–10.5)
nRBC: 0 /100 WBC

## 2012-08-06 MED ORDER — ACETAMINOPHEN 650 MG RE SUPP: 650.0000 mg | Freq: Four times a day (QID) | RECTAL | Status: DC | PRN

## 2012-08-06 MED ORDER — ONDANSETRON HCL 4 MG/2ML IJ SOLN
4.0000 mg | Freq: Four times a day (QID) | INTRAMUSCULAR | Status: DC | PRN
  Administered 2012-08-07 – 2012-08-19 (×6): 4 mg via INTRAVENOUS
  Filled 2012-08-06 (×7): qty 2

## 2012-08-06 MED ORDER — OXYCODONE HCL 5 MG PO TABS
5.0000 mg | ORAL_TABLET | ORAL | Status: DC | PRN
  Administered 2012-08-06 – 2012-08-08 (×4): 5 mg via ORAL
  Filled 2012-08-06 (×4): qty 1

## 2012-08-06 MED ORDER — ALUM & MAG HYDROXIDE-SIMETH 200-200-20 MG/5ML PO SUSP: 30.0000 mL | Freq: Four times a day (QID) | ORAL | Status: DC | PRN

## 2012-08-06 MED ORDER — ZOLPIDEM TARTRATE 5 MG PO TABS
5.0000 mg | ORAL_TABLET | Freq: Every evening | ORAL | Status: DC | PRN
  Administered 2012-08-06: 5 mg via ORAL
  Filled 2012-08-06: qty 1

## 2012-08-06 MED ORDER — SODIUM CHLORIDE 0.9 % IV SOLN
INTRAVENOUS | Status: DC
  Administered 2012-08-06 – 2012-08-09 (×2): via INTRAVENOUS
  Administered 2012-08-10: 125 mL/h via INTRAVENOUS
  Administered 2012-08-10 – 2012-08-11 (×2): via INTRAVENOUS
  Administered 2012-08-11: 100 mL/h via INTRAVENOUS
  Administered 2012-08-11 – 2012-08-12 (×2): via INTRAVENOUS

## 2012-08-06 MED ORDER — ACETAMINOPHEN 325 MG PO TABS: 650.0000 mg | ORAL_TABLET | Freq: Four times a day (QID) | ORAL | Status: DC | PRN

## 2012-08-06 MED ORDER — ONDANSETRON HCL 4 MG PO TABS: 4.0000 mg | ORAL_TABLET | Freq: Four times a day (QID) | ORAL | Status: DC | PRN

## 2012-08-06 MED ORDER — HYDROMORPHONE HCL PF 1 MG/ML IJ SOLN: 0.5000 mg | INTRAMUSCULAR | Status: DC | PRN

## 2012-08-06 NOTE — H&P (Addendum)
[facility] History and Physical  [name] [name] ZOX:[id number] DOB: March 23, 1935 DOA: 08/06/2012  Referring physician:  PCP: Fredirick Maudlin, MD   Chief Complaint: ABD Pain  HPI:  [name] [name] is an 77 y.o. Female who was transferred from [facility] in [place] to [place] due to Hypotension , ABD Pain and UTI along with a history of an AAA which is being followed by Dr, Jaynie Collins.  She lives at an Assisted Living facility [facility]) and has had complaints of Abd Pain, nausea and dry heaves X 3 days, She denies constipation, and diarrhea. She also Denies having fevers or chills.  She has had worsening weakness.  She told her family today that she needed to go to the hospital.     Review of Systems:   + ABD Pain The patient denies anorexia, fever, weight loss vision loss, decreased hearing, hoarseness, chest pain, syncope, dyspnea on exertion, peripheral edema, balance deficits, hemoptysis, melena, hematochezia, severe indigestion/heartburn, hematuria, incontinence, genital sores, muscle weakness, suspicious skin lesions, transient blindness, difficulty walking, depression, unusual weight change, abnormal bleeding, enlarged lymph nodes, angioedema, and breast masses.    Past Medical History  Diagnosis Date  . Hypertension   . Hyperlipidemia   . COPD (chronic obstructive pulmonary disease)   . Arthritis   . Depression with anxiety   . GERD (gastroesophageal reflux disease)   . Hiatal hernia   . Atrial fibrillation   . Productive cough   . Leg pain   . AAA (abdominal aortic aneurysm)   . CAD (coronary artery disease)   . Chronic kidney disease    Past Surgical History  Procedure Date  . Kidney stone surgery   . Cystectomy     cyst removal on pituitary gland    Prior to Admission medications   Medication Sig Start Date End Date Taking? Authorizing Provider  acetaminophen (TYLENOL) 500 MG tablet Take 500 mg by mouth every 4 (four) hours as needed. For  pain   Yes Historical Provider, MD  albuterol (PROVENTIL HFA;VENTOLIN HFA) 108 (90 BASE) MCG/ACT inhaler Inhale 2 puffs into the lungs every 4 (four) hours as needed. For wheezing   Yes Historical Provider, MD  ALPRAZolam Prudy Feeler) 1 MG tablet Take 1 mg by mouth 3 (three) times daily.   Yes Historical Provider, MD  Alum & Mag Hydroxide-Simeth (ANTACID LIQUID PO) Take 30 mLs by mouth every 2 (two) hours as needed. Heartburn   Yes Historical Provider, MD  amLODipine (NORVASC) 2.5 MG tablet Take 2.5 mg by mouth daily.   Yes Historical Provider, MD  Choline Fenofibrate (TRILIPIX) 135 MG capsule Take 135 mg by mouth daily.   Yes Historical Provider, MD  diclofenac sodium (VOLTAREN) 1 % GEL Apply 1 application topically 4 (four) times daily. To hip   Yes Historical Provider, MD  HYDROcodone-acetaminophen (NORCO) 10-325 MG per tablet Take 1 tablet by mouth 3 (three) times daily.   Yes Historical Provider, MD  HYDROcodone-homatropine (HYCODAN) 5-1.5 MG/5ML syrup Take 5 mLs by mouth every 4 (four) hours as needed. For cough   Yes Historical Provider, MD  nebivolol (BYSTOLIC) 5 MG tablet Take 5 mg by mouth daily.   Yes Historical Provider, MD  omega-3 acid ethyl esters (LOVAZA) 1 G capsule Take 1 g by mouth daily.    Yes Historical Provider, MD  omeprazole (PRILOSEC) 20 MG capsule Take 20 mg by mouth daily.     Yes Historical Provider, MD  rosuvastatin (CRESTOR) 40 MG tablet Take 40 mg by  mouth daily.     Yes Historical Provider, MD  simethicone (MYLICON) 80 MG chewable tablet Chew 80 mg by mouth 3 (three) times daily.    Yes Historical Provider, MD  tiotropium (SPIRIVA HANDIHALER) 18 MCG inhalation capsule Place 18 mcg into inhaler and inhale daily.     Yes Historical Provider, MD  torsemide (DEMADEX) 20 MG tablet Take 20 mg by mouth 2 (two) times daily.   Yes Historical Provider, MD  torsemide (DEMADEX) 20 MG tablet Take 20 mg by mouth daily as needed. For legs   Yes Historical Provider, MD    Allergies    Allergen Reactions  . Asa (Aspirin) Other (See Comments)    "my ears bleed"  . Lidocaine Other (See Comments)    unknown  . Nsaids Other (See Comments)    unknown  . Sulfonamide Derivatives Other (See Comments)    unknown    Social History: Lives at an Assisted Living Facility,    reports that she has quit smoking. Her smoking use included Cigarettes. She quit smokeless tobacco use about 4 years ago. She reports that she does not drink alcohol or use illicit drugs.   Family History  Problem Relation Age of Onset  . Heart disease Mother   . Hyperlipidemia Mother   . Hypertension Mother   . Stroke Mother   . Cancer Brother     Physical Exam: Filed Vitals:   08/06/12 1945 08/06/12 1949  BP:  99/59  Temp: 98.4 F (36.9 C)   TempSrc: Oral   Height:  5\' 4"  (1.626 m)  Weight:  85 kg (187 lb 6.3 oz)  SpO2:  99%   Physical Exam:  GEN:  Pleasant   examined  and in no acute distress; cooperative with exam Filed Vitals:   08/06/12 1945 08/06/12 1949  BP:  99/59  Temp: 98.4 F (36.9 C)   TempSrc: Oral   Height:  5\' 4"  (1.626 m)  Weight:  85 kg (187 lb 6.3 oz)  SpO2:  99%   Blood pressure 99/59, temperature 98.4 F (36.9 C), temperature source Oral, height 5\' 4"  (1.626 m), weight 85 kg (187 lb 6.3 oz), SpO2 99.00%. PSYCH: She is alert and oriented x4; does not appear anxious does not appear depressed; affect is normal HEENT: Normocephalic and Atraumatic, Mucous membranes pink; PERRLA; EOM intact; Fundi:  Benign;  No scleral icterus, Nares: Patent, Oropharynx: Clear, Edentulous with Dentures, Neck:  FROM, no cervical lymphadenopathy nor thyromegaly or carotid bruit; no JVD; Breasts:: Not examined CHEST WALL: No tenderness CHEST: Normal respiration, clear to auscultation bilaterally HEART: Regular rate and rhythm; no murmurs rubs or gallops BACK: No kyphosis or scoliosis; no CVA tenderness ABDOMEN: Positive Bowel Sounds, soft non-tender; no masses, no organomegaly, no  pannus; no intertriginous candida. Rectal Exam: Not done EXTREMITIES: No bone or joint deformity; age-appropriate arthropathy of the hands and knees; no cyanosis, clubbing or edema; no ulcerations. Genitalia: not examined PULSES: 2+ and symmetric SKIN: Normal hydration no rash or ulceration CNS: Cranial nerves 2-12 grossly intact no focal neurologic deficit    Labs on Admission:  No results found for this or any previous visit (from the past 48 hour(s)). No results found.   Radiological Exams on Admission: No results found.  EKG: Independently reviewed.   Assessment: Principal Problem:  *Hypotension Active Problems:  AAA (abdominal aortic aneurysm)  Coronary atherosclerosis  COPD (chronic obstructive pulmonary disease)  Chronic renal failure  Abdominal  pain, other specified site  Hyperlipidemia  ARTHRITIS, HIPS,  BILATERAL   Plan:        Patient appears to have early sepsis of which the origin is her urine.  Repeat laboratory studies have been ordered. Patient has been admitted to a Stepdown bed. And Fluid resuscitation has been ordered.  Her blood pressure medications have been placed on hold.  As for her AAA, her pulses are intact and she is perfusing her extremities, mentating and making urine.  She will be monitored for further changes and Vascular on call for the weekend will be notified of her admission.  Empiric Antibiotics will be ordered.  As well.  Codes Status has been discussed and She is a Full Code.       Code Status:  FULL CODE Family Communication: Family at bedside Disposition Plan: Return to Assisted Living Facility if Possible  Time spent: 60 minutes  Ron Parker [facility] Pager [phone]  If 7PM-7AM, please contact night-coverage [email or web address] Password [email or web address] 08/06/2012, 9:14 PM    08/07/2012  Addendum:  Labs sent and returned and reviewed.   Corrected Calcium = 8.38  BUN/Cr =    122/5.55 Elevated Transaminases

## 2012-08-07 ENCOUNTER — Inpatient Hospital Stay (HOSPITAL_COMMUNITY): Payer: Medicare Other

## 2012-08-07 ENCOUNTER — Encounter (HOSPITAL_COMMUNITY): Payer: Self-pay

## 2012-08-07 DIAGNOSIS — N179 Acute kidney failure, unspecified: Secondary | ICD-10-CM

## 2012-08-07 DIAGNOSIS — I714 Abdominal aortic aneurysm, without rupture: Secondary | ICD-10-CM

## 2012-08-07 LAB — BASIC METABOLIC PANEL
BUN: 123 mg/dL — ABNORMAL HIGH (ref 6–23)
CO2: 19 mEq/L (ref 19–32)
Calcium: 6.9 mg/dL — ABNORMAL LOW (ref 8.4–10.5)
GFR calc non Af Amer: 7 mL/min — ABNORMAL LOW (ref >90)
Glucose, Bld: 86 mg/dL (ref 70–99)
Sodium: 135 mEq/L (ref 135–145)

## 2012-08-07 LAB — CBC
MCH: 28.5 pg (ref 26.0–34.0)
MCHC: 35 g/dL (ref 30.0–36.0)
MCV: 81.6 fL (ref 78.0–100.0)
Platelets: 177 10*3/uL (ref 150–400)
RBC: 4.03 MIL/uL (ref 3.87–5.11)

## 2012-08-07 LAB — URINALYSIS, ROUTINE W REFLEX MICROSCOPIC
Bilirubin Urine: NEGATIVE
Glucose, UA: NEGATIVE mg/dL
Ketones, ur: NEGATIVE mg/dL
pH: 5 (ref 5.0–8.0)

## 2012-08-07 LAB — URINE MICROSCOPIC-ADD ON

## 2012-08-07 LAB — SODIUM, URINE, RANDOM: Sodium, Ur: 84 mEq/L

## 2012-08-07 LAB — CREATININE, URINE, RANDOM: Creatinine, Urine: 37.85 mg/dL

## 2012-08-07 MED ORDER — TIOTROPIUM BROMIDE MONOHYDRATE 18 MCG IN CAPS
18.0000 ug | ORAL_CAPSULE | Freq: Every day | RESPIRATORY_TRACT | Status: DC
  Administered 2012-08-07 – 2012-08-20 (×9): 18 ug via RESPIRATORY_TRACT
  Filled 2012-08-07 (×2): qty 5

## 2012-08-07 MED ORDER — DEXTROSE 5 % IV SOLN
1.0000 g | INTRAVENOUS | Status: DC
  Administered 2012-08-07 – 2012-08-10 (×4): 1 g via INTRAVENOUS
  Filled 2012-08-07 (×5): qty 10

## 2012-08-07 MED ORDER — DICLOFENAC SODIUM 1 % TD GEL
1.0000 "application " | Freq: Four times a day (QID) | TRANSDERMAL | Status: DC
  Administered 2012-08-08 – 2012-08-09 (×6): 1 via TOPICAL
  Filled 2012-08-07: qty 100

## 2012-08-07 MED ORDER — ALBUTEROL SULFATE HFA 108 (90 BASE) MCG/ACT IN AERS
2.0000 | INHALATION_SPRAY | Freq: Four times a day (QID) | RESPIRATORY_TRACT | Status: DC | PRN
  Filled 2012-08-07: qty 6.7

## 2012-08-07 MED ORDER — PANTOPRAZOLE SODIUM 40 MG PO TBEC
40.0000 mg | DELAYED_RELEASE_TABLET | Freq: Every day | ORAL | Status: DC
  Administered 2012-08-07: 40 mg via ORAL
  Filled 2012-08-07: qty 1

## 2012-08-07 MED ORDER — HYDROCODONE-ACETAMINOPHEN 10-325 MG PO TABS
1.0000 | ORAL_TABLET | Freq: Three times a day (TID) | ORAL | Status: DC
  Administered 2012-08-07 (×3): 1 via ORAL
  Filled 2012-08-07 (×3): qty 1

## 2012-08-07 MED ORDER — ALPRAZOLAM 0.5 MG PO TABS
1.0000 mg | ORAL_TABLET | Freq: Three times a day (TID) | ORAL | Status: DC
  Administered 2012-08-07: 1 mg via ORAL
  Filled 2012-08-07 (×2): qty 1

## 2012-08-07 MED ORDER — FENOFIBRATE 160 MG PO TABS
160.0000 mg | ORAL_TABLET | Freq: Every day | ORAL | Status: DC
  Administered 2012-08-07: 160 mg via ORAL
  Filled 2012-08-07: qty 1

## 2012-08-07 MED ORDER — OMEGA-3-ACID ETHYL ESTERS 1 G PO CAPS
1.0000 g | ORAL_CAPSULE | Freq: Every day | ORAL | Status: DC
  Administered 2012-08-07: 1 g via ORAL
  Filled 2012-08-07: qty 1

## 2012-08-07 MED ORDER — ALPRAZOLAM 0.25 MG PO TABS
0.2500 mg | ORAL_TABLET | Freq: Three times a day (TID) | ORAL | Status: DC
  Administered 2012-08-07 (×2): 0.25 mg via ORAL
  Filled 2012-08-07 (×2): qty 1

## 2012-08-07 MED ORDER — ATORVASTATIN CALCIUM 80 MG PO TABS
80.0000 mg | ORAL_TABLET | Freq: Every day | ORAL | Status: DC
  Filled 2012-08-07: qty 1

## 2012-08-07 NOTE — Progress Notes (Signed)
D: Pt arrived to 3315, via Care link, V.S.S. Pt introduced to unit questions answered, resting comfortably

## 2012-08-07 NOTE — Progress Notes (Signed)
[facility] PROGRESS NOTE  [name] [name] ZOX:[id number] DOB: 09/26/1935 DOA: 08/06/2012 PCP: Fredirick Maudlin, MD  Assessment/Plan: Principal Problem:  *ARF on CKD 4 Baseline Cr about 1/7-1/9- GFR < 30 CT abd/pelvis on 8/14 in [facility] noted to be done with contrast- Pt confirm that contrast was used- therefore, the cause of her renal failure is likely ATN superimposed on dehydration- she is on Demedex at home and previous BUN/ Cr ratios noted to have been elevated. Will obatain urine sodium and Cr to calculate a FeNa.  Hold nephrotoxic drug and cut back on other medications that have tendency to accumulate in ARF (Xanax, Narcotics)  Active Problems:  Hypotension Due to dehydraton vs accumulation of anti-hypertenisves in setting of ARf.  Cont to hold Norvasc and Bystolic for now.    Abdominal  Pain She mostly complains of nausea and had had dry heaves- currently no specific abdominal pain- bowels noted to be slow- may have developed an ileus from uremia- will f/u on xray abdomen- ice chips only for now.   UTI Rocephin, f/u cultures- were not ordered in ER - keep in mind pt has already given antibiotics and culture may be sterile   AAA (abdominal aortic aneurysm) Being followed as oupt- CT performed- results pending   COPD Stable currently    Code Status: full code Family Communication: non in room Disposition Plan: follow in SDU   Brief narrative: [name] [name] is an 77 y.o. Female who was transferred from [facility] in [place] to [place] due to Hypotension , ABD Pain and UTI along with a history of an AAA which is being followed by Dr, Jaynie Collins. She lives at an Assisted Living facility [facility]) and has had complaints of Abd Pain, nausea and dry heaves X 3 days, She denies constipation, and diarrhea. She also Denies having fevers or chills. She has had worsening weakness. She told her family today that she needed to go to the  hospital.  Antibiotics:  Rocephin- 8/16  HPI/Subjective: Pt alert - continues to feels "sick" which is the reason why she was sent from her facility to [facility]. Admits to dry heaves with resultant abdominal discomfort- no significant pain currently.   Objective: Filed Vitals:   08/07/12 0324 08/07/12 0700 08/07/12 1126 08/07/12 1147  BP: 104/52   130/102  Pulse: 63   60  Temp: 98.1 F (36.7 C) 97.9 F (36.6 C)  98.1 F (36.7 C)  TempSrc: Oral Oral  Oral  Resp: 15   14  Height:      Weight:      SpO2: 97%  95% 95%    Intake/Output Summary (Last 24 hours) at 08/07/12 1417 Last data filed at 08/07/12 1148  Gross per 24 hour  Intake      0 ml  Output   1225 ml  Net  -1225 ml    Exam:   General:  No distress, alert  Cardiovascular: RRR, No murmurs  Respiratory: CTA b/l  Abdomen: soft, distended, bowel sounds not audible, tympanic to percussion  Ext: no c/c/e  Data Reviewed: Basic Metabolic Panel:  Lab 08/07/12 8119 08/06/12 2143  NA 135 133*  K 3.0* 3.7  CL 89* 88*  CO2 19 19  GLUCOSE 86 91  BUN 123* 122*  CREATININE 5.43* 5.55*  CALCIUM 6.9* 6.7*  MG -- --  PHOS -- --   Liver Function Tests:  Lab 08/06/12 2143  AST 266*  ALT 76*  ALKPHOS 98  BILITOT  1.1  PROT 6.6  ALBUMIN 1.9*   No results found for this basename: LIPASE:5,AMYLASE:5 in the last 168 hours No results found for this basename: AMMONIA:5 in the last 168 hours CBC:  Lab 08/07/12 0259 08/06/12 2143  WBC 5.3 5.7  NEUTROABS -- 4.2  HGB 11.5* 11.6*  HCT 32.9* 33.9*  MCV 81.6 81.9  PLT 177 199   Cardiac Enzymes: No results found for this basename: CKTOTAL:5,CKMB:5,CKMBINDEX:5,TROPONINI:5 in the last 168 hours BNP (last 3 results)  Basename 05/23/12 1326  PROBNP 484.0*   CBG: No results found for this basename: GLUCAP:5 in the last 168 hours  Recent Results (from the past 240 hour(s))  MRSA PCR SCREENING     Status: Normal   Collection Time   08/06/12  11:43 PM      Component Value Range Status Comment   MRSA by PCR NEGATIVE  NEGATIVE Final      Studies: Ct Abdomen Pelvis Wo Contrast  08/04/2012  *RADIOLOGY REPORT*  Clinical Data: Follow-up abdominal aortic aneurysm.  CT ABDOMEN AND PELVIS WITHOUT CONTRAST  Technique:  Multidetector CT imaging of the abdomen and pelvis was performed following the standard protocol without intravenous contrast.  Comparison: 12/04/2011  Findings: Infrarenal abdominal aortic aneurysm measures 5.4 x 5.4 cm, without significant change in size since previous study.  No evidence of aneurysm leak or rupture.  No evidence of iliac artery aneurysm.  Tiny nonobstructing left intrarenal calculus again noted measuring less than 5 mm.  Bilateral renal parenchymal atrophy again demonstrated, however there is no evidence of renal mass or hydronephrosis.  The other abdominal parenchymal organs have a normal appearance on this noncontrast study. Small calcified gallstones seen in the gallbladder fundus, however there is no evidence of cholecystitis.  Uterus and adnexa are unremarkable in appearance.  No soft tissue masses or lymphadenopathy identified.  No evidence of inflammatory process or abnormal fluid collections.  Severe bilateral hip DJD also noted with chronic collapse of the articular surface of both femoral heads.  IMPRESSION:  1.  Stable 5.4 cm infrarenal abdominal aortic aneurysm.  No evidence of aneurysm leak or rupture. 2.  Nonobstructing left nephrolithiasis and cholelithiasis.  Original Report Authenticated By: Danae Orleans, M.D.    Scheduled Meds:    . ALPRAZolam  0.25 mg Oral TID  . cefTRIAXone (ROCEPHIN)  IV  1 g Intravenous Q24H  . HYDROcodone-acetaminophen  1 tablet Oral TID  . pantoprazole  40 mg Oral Q1200  . tiotropium  18 mcg Inhalation Daily  . DISCONTD: ALPRAZolam  1 mg Oral TID  . DISCONTD: atorvastatin  80 mg Oral q1800  . DISCONTD: fenofibrate  160 mg Oral Daily  . DISCONTD: omega-3 acid ethyl  esters  1 g Oral Daily   Continuous Infusions:    . sodium chloride 75 mL/hr at 08/07/12 0600    ________________________________________________________________________  Time spent: 35 min    [facility]  [facility] Pager [phone] If 8PM-8AM, please contact night-coverage at [email or web address], password [facility] 08/07/2012, 2:17 PM  LOS: 1 day

## 2012-08-07 NOTE — Consult Note (Signed)
[facility] [place] Consult Note   History of Present Illness:  Patient is a 77 y.o. year old female who presents for evaluation of AAA.  She was admitted yesterday with presumed urinary sepsis.  I was asked by Dr Lovell Sheehan to evaluate pt for her AAA.  The patient was seen by my partner Dr Edilia Bo 3 days ago for her aneurysm and it was thought to be stable at that time.  She has had no imaging of her aorta this admission.  She complains of epigastric abdominal pain but no back pain.  She states the pain has been present several days.  She has also had some nausea.  Multiple chronic medical problems listed below which are stable.  Apparently had some hypotension at [facility].  Past Medical History  Diagnosis Date  . Hypertension   . Hyperlipidemia   . COPD (chronic obstructive pulmonary disease)   . Arthritis   . Depression with anxiety   . GERD (gastroesophageal reflux disease)   . Hiatal hernia   . Atrial fibrillation   . Productive cough   . Leg pain   . AAA (abdominal aortic aneurysm)   . CAD (coronary artery disease)   . Chronic kidney disease     Past Surgical History  Procedure Date  . Kidney stone surgery   . Cystectomy     cyst removal on pituitary gland     Social History History  Substance Use Topics  . Smoking status: Former Smoker    Types: Cigarettes  . Smokeless tobacco: Former Neurosurgeon    Quit date: 01/11/2008  . Alcohol Use: No    Family History Family History  Problem Relation Age of Onset  . Heart disease Mother   . Hyperlipidemia Mother   . Hypertension Mother   . Stroke Mother   . Cancer Brother     Allergies  Allergies  Allergen Reactions  . Asa (Aspirin) Other (See Comments)    "my ears bleed"  . Lidocaine Other (See Comments)    unknown  . Nsaids Other (See Comments)    unknown  . Sulfonamide Derivatives Other (See Comments)    unknown     Current Facility-Administered Medications  Medication Dose Route  Frequency Provider Last Rate Last Dose  . 0.9 %  sodium chloride infusion   Intravenous Continuous Ron Parker, MD 75 mL/hr at 08/07/12 0600    . acetaminophen (TYLENOL) tablet 650 mg  650 mg Oral Q6H PRN Ron Parker, MD       Or  . acetaminophen (TYLENOL) suppository 650 mg  650 mg Rectal Q6H PRN Harvette Velora Heckler, MD      . albuterol (PROVENTIL HFA;VENTOLIN HFA) 108 (90 BASE) MCG/ACT inhaler 2 puff  2 puff Inhalation Q6H PRN Ron Parker, MD      . ALPRAZolam Prudy Feeler) tablet 1 mg  1 mg Oral TID Ron Parker, MD   1 mg at 08/07/12 0906  . alum & mag hydroxide-simeth (MAALOX/MYLANTA) 200-200-20 MG/5ML suspension 30 mL  30 mL Oral Q6H PRN Ron Parker, MD      . atorvastatin (LIPITOR) tablet 80 mg  80 mg Oral q1800 Harvette Velora Heckler, MD      . cefTRIAXone (ROCEPHIN) 1 g in dextrose 5 % 50 mL IVPB  1 g Intravenous Q24H Calvert Cantor, MD   1 g at 08/07/12 0906  . fenofibrate tablet 160 mg  160 mg Oral Daily Ron Parker, MD   160  mg at 08/07/12 0906  . HYDROcodone-acetaminophen (NORCO) 10-325 MG per tablet 1 tablet  1 tablet Oral TID Ron Parker, MD   1 tablet at 08/07/12 0906  . HYDROmorphone (DILAUDID) injection 0.5-1 mg  0.5-1 mg Intravenous Q3H PRN Ron Parker, MD      . omega-3 acid ethyl esters (LOVAZA) capsule 1 g  1 g Oral Daily Ron Parker, MD   1 g at 08/07/12 0906  . ondansetron (ZOFRAN) tablet 4 mg  4 mg Oral Q6H PRN Ron Parker, MD       Or  . ondansetron (ZOFRAN) injection 4 mg  4 mg Intravenous Q6H PRN Ron Parker, MD   4 mg at 08/07/12 0905  . oxyCODONE (Oxy IR/ROXICODONE) immediate release tablet 5 mg  5 mg Oral Q4H PRN Ron Parker, MD   5 mg at 08/07/12 0736  . pantoprazole (PROTONIX) EC tablet 40 mg  40 mg Oral Q1200 Ron Parker, MD      . tiotropium [facility] inhalation capsule 18 mcg  18 mcg Inhalation Daily Ron Parker, MD      . zolpidem (AMBIEN) tablet 5 mg  5 mg Oral QHS PRN  Ron Parker, MD   5 mg at 08/06/12 2336    ROS:   General:  No weight loss, Fever, chills  HEENT: No recent headaches, no nasal bleeding, no visual changes, no sore throat  Neurologic: No dizziness, blackouts, seizures. No recent symptoms of stroke or mini- stroke. No recent episodes of slurred speech, or temporary blindness.  Cardiac: No recent episodes of chest pain/pressure, no shortness of breath at rest.  No shortness of breath with exertion.  Denies history of atrial fibrillation or irregular heartbeat  Vascular: No history of rest pain in feet.  No history of claudication.  No history of non-healing ulcer, No history of DVT   Pulmonary: No home oxygen, no productive cough, no hemoptysis,  No asthma or wheezing  Musculoskeletal:  [ ]  Arthritis, [ ]  Low back pain,  [ ]  Joint pain  Hematologic:No history of hypercoagulable state.  No history of easy bleeding.  No history of anemia  Gastrointestinal: No hematochezia or melena,  No gastroesophageal reflux, no trouble swallowing  Urinary: [ ]  chronic Kidney disease, [ ]  on HD - [ ]  MWF or [ ]  TTHS, [ ]  Burning with urination, [ ]  Frequent urination, [ ]  Difficulty urinating;   Skin: No rashes  Psychological: No history of anxiety,  No history of depression   Physical Examination  Filed Vitals:   08/06/12 1949 08/07/12 0000 08/07/12 0324 08/07/12 0700  BP: 99/59 100/50 104/52   Pulse:  61 63   Temp:  97.6 F (36.4 C) 98.1 F (36.7 C) 97.9 F (36.6 C)  TempSrc:  Oral Oral Oral  Resp:  16 15   Height: 5\' 4"  (1.626 m)     Weight: 187 lb 6.3 oz (85 kg)     SpO2: 99% 97% 97%     Body mass index is 32.17 kg/(m^2).  General:  Alert and oriented, no acute distress HEENT: Normal Neck: No bruit or JVD Pulmonary: Clear to auscultation bilaterally Cardiac: Regular Rate and Rhythm without murmur Abdomen: Soft, slightly distended, obese, mild epigastric tenderness on palpation Skin: No rash Musculoskeletal: No  deformity or edema  Neurologic: Upper and lower extremity motor 5/5 and symmetric  DATA: urinalysis + nitrite + wbc   ASSESSMENT: AAA probably not the source of her abdominal pain  and symptoms.  She is not clinically acting like a symptomatic AAA.   PLAN:  If clinical condition deteriorates would obtain non contrast CT abdomen pelvis otherwise continue to follow up with Dr Edilia Bo as scheduled in 6 months  Fabienne Bruns, MD [facility] [place] Office: [phone] Pager: [phone]

## 2012-08-08 ENCOUNTER — Inpatient Hospital Stay (HOSPITAL_COMMUNITY): Payer: Medicare Other

## 2012-08-08 DIAGNOSIS — K56609 Unspecified intestinal obstruction, unspecified as to partial versus complete obstruction: Secondary | ICD-10-CM

## 2012-08-08 LAB — BASIC METABOLIC PANEL
CO2: 19 mEq/L (ref 19–32)
Calcium: 8.1 mg/dL — ABNORMAL LOW (ref 8.4–10.5)
Creatinine, Ser: 5.12 mg/dL — ABNORMAL HIGH (ref 0.50–1.10)
Glucose, Bld: 68 mg/dL — ABNORMAL LOW (ref 70–99)

## 2012-08-08 LAB — CBC
MCH: 28.4 pg (ref 26.0–34.0)
MCV: 82.3 fL (ref 78.0–100.0)
Platelets: 206 10*3/uL (ref 150–400)
RBC: 4.29 MIL/uL (ref 3.87–5.11)
RDW: 15.5 % (ref 11.5–15.5)

## 2012-08-08 MED ORDER — METOCLOPRAMIDE HCL 5 MG/ML IJ SOLN
5.0000 mg | Freq: Three times a day (TID) | INTRAMUSCULAR | Status: AC | PRN
  Filled 2012-08-08: qty 1

## 2012-08-08 MED ORDER — PANTOPRAZOLE SODIUM 40 MG IV SOLR
40.0000 mg | Freq: Every day | INTRAVENOUS | Status: DC
  Administered 2012-08-08 – 2012-08-13 (×6): 40 mg via INTRAVENOUS
  Filled 2012-08-08 (×7): qty 40

## 2012-08-08 MED ORDER — POTASSIUM CHLORIDE 10 MEQ/100ML IV SOLN
10.0000 meq | INTRAVENOUS | Status: AC
  Administered 2012-08-08 (×4): 10 meq via INTRAVENOUS
  Filled 2012-08-08: qty 400

## 2012-08-08 MED ORDER — BISACODYL 10 MG RE SUPP
10.0000 mg | Freq: Once | RECTAL | Status: AC
  Administered 2012-08-08: 10 mg via RECTAL
  Filled 2012-08-08: qty 1

## 2012-08-08 MED ORDER — HYDROMORPHONE HCL PF 1 MG/ML IJ SOLN
0.5000 mg | INTRAMUSCULAR | Status: DC | PRN
  Administered 2012-08-08 – 2012-08-10 (×8): 0.5 mg via INTRAVENOUS
  Filled 2012-08-08 (×8): qty 1

## 2012-08-08 NOTE — Progress Notes (Signed)
[facility] PROGRESS NOTE  [name] [name] NWG:[id number] DOB: [birth year] DOA: 08/06/2012 PCP: Fredirick Maudlin, MD  Assessment/Plan: Principal Problem:  *ARF on CKD 4- non-oliguric - Baseline Cr about 1/7-1/9- GFR < 30 -CT abd/pelvis on 8/14 in [facility] noted to be done with contrast- Pt confirm that contrast was used- therefore, the cause of her renal failure is likely ATN superimposed on dehydration- she is on Demedex at home and previous BUN/ Cr ratios noted to have been elevated.  - FeNa 8.93%-  Consistent with probable  ATN Hold nephrotoxic drugs and cut back on other medications that have tendency to accumulate in ARF (Xanax, Narcotics) - Cr slightly improved - urine output is reassuring   Active Problems:  Hypotension Due to dehydraton vs accumulation of anti-hypertenisves in setting of ARF Cont to hydrate while NPO Cont to hold Norvasc and Bystolic for now.    Abdominal  Pain Xray abdomen consistent with high grade obstruction- NG tube placed- Surgery consulted- this may be an ileus from azotemia but due to h/o prior surgery, need to keep in mind that she likely has adhesions- continue conservative measures for now  UTI Rocephin, f/u cultures- were not ordered in ER - keep in mind pt has already been given antibiotics and culture may be sterile   AAA (abdominal aortic aneurysm) Being followed as oupt- CT performed on 8/14- results reveal a stable 5.4 cm infrarenal aneurysm w/o leak or rupture   COPD Stable currently    Code Status: full code Family Communication: non in room Disposition Plan: follow in SDU   Brief narrative: [name] [name] is an 77 y.o. Female who was transferred from [facility] in [place] to [place] due to Hypotension , ABD Pain and UTI along with a history of an AAA which is being followed by Dr, Jaynie Collins. She lives at an Assisted Living facility [facility]) and has had complaints of Abd Pain, nausea and dry heaves X 3 days, She  denies constipation, and diarrhea. She also Denies having fevers or chills. She has had worsening weakness. She told her family today that she needed to go to the hospital.  Antibiotics:  Rocephin- 8/16  HPI/Subjective: Pt alert - continues to have abdominal discomfort- spitting up small amouts of bilious materials  Objective: Filed Vitals:   08/08/12 0300 08/08/12 0700 08/08/12 1159 08/08/12 1553  BP: 87/49 90/55 89/48  119/59  Pulse: 67 64 66 66  Temp: 98.2 F (36.8 C) 98.8 F (37.1 C) 97.5 F (36.4 C) 97.8 F (36.6 C)  TempSrc: Oral Oral Oral Oral  Resp: 18 13 12 13   Height:      Weight:      SpO2: 97% 92% 94% 96%    Intake/Output Summary (Last 24 hours) at 08/08/12 1719 Last data filed at 08/08/12 1700  Gross per 24 hour  Intake   2125 ml  Output   1300 ml  Net    825 ml    Exam:   General:  No distress, alert  Cardiovascular: RRR, No murmurs  Respiratory: CTA b/l  Abdomen: soft, distended, bowel sounds not audible, tympanic to percussion  Ext: no c/c/e  Data Reviewed: Basic Metabolic Panel:  Lab 08/08/12 5784 08/07/12 0259 08/06/12 2143  NA 137 135 133*  K 3.2* 3.0* 3.7  CL 90* 89* 88*  CO2 19 19 19   GLUCOSE 68* 86 91  BUN 125* 123* 122*  CREATININE 5.12* 5.43* 5.55*  CALCIUM 8.1* 6.9* 6.7*  MG -- -- --  PHOS -- -- --   Liver Function Tests:  Lab 08/06/12 2143  AST 266*  ALT 76*  ALKPHOS 98  BILITOT 1.1  PROT 6.6  ALBUMIN 1.9*   No results found for this basename: LIPASE:5,AMYLASE:5 in the last 168 hours No results found for this basename: AMMONIA:5 in the last 168 hours CBC:  Lab 08/08/12 0835 08/07/12 0259 08/06/12 2143  WBC 6.1 5.3 5.7  NEUTROABS -- -- 4.2  HGB 12.2 11.5* 11.6*  HCT 35.3* 32.9* 33.9*  MCV 82.3 81.6 81.9  PLT 206 177 199   Cardiac Enzymes: No results found for this basename: CKTOTAL:5,CKMB:5,CKMBINDEX:5,TROPONINI:5 in the last 168 hours BNP (last 3 results)  Basename 05/23/12 1326  PROBNP 484.0*    CBG: No results found for this basename: GLUCAP:5 in the last 168 hours  Recent Results (from the past 240 hour(s))  MRSA PCR SCREENING     Status: Normal   Collection Time   08/06/12 11:43 PM      Component Value Range Status Comment   MRSA by PCR NEGATIVE  NEGATIVE Final      Studies: Ct Abdomen Pelvis Wo Contrast  08/04/2012  *RADIOLOGY REPORT*  Clinical Data: Follow-up abdominal aortic aneurysm.  CT ABDOMEN AND PELVIS WITHOUT CONTRAST  Technique:  Multidetector CT imaging of the abdomen and pelvis was performed following the standard protocol without intravenous contrast.  Comparison: 12/04/2011  Findings: Infrarenal abdominal aortic aneurysm measures 5.4 x 5.4 cm, without significant change in size since previous study.  No evidence of aneurysm leak or rupture.  No evidence of iliac artery aneurysm.  Tiny nonobstructing left intrarenal calculus again noted measuring less than 5 mm.  Bilateral renal parenchymal atrophy again demonstrated, however there is no evidence of renal mass or hydronephrosis.  The other abdominal parenchymal organs have a normal appearance on this noncontrast study. Small calcified gallstones seen in the gallbladder fundus, however there is no evidence of cholecystitis.  Uterus and adnexa are unremarkable in appearance.  No soft tissue masses or lymphadenopathy identified.  No evidence of inflammatory process or abnormal fluid collections.  Severe bilateral hip DJD also noted with chronic collapse of the articular surface of both femoral heads.  IMPRESSION:  1.  Stable 5.4 cm infrarenal abdominal aortic aneurysm.  No evidence of aneurysm leak or rupture. 2.  Nonobstructing left nephrolithiasis and cholelithiasis.  Original Report Authenticated By: Danae Orleans, M.D.    Scheduled Meds:    . bisacodyl  10 mg Rectal Once  . cefTRIAXone (ROCEPHIN)  IV  1 g Intravenous Q24H  . diclofenac sodium  1 application Topical QID  . pantoprazole (PROTONIX) IV  40 mg  Intravenous Q1200  . potassium chloride  10 mEq Intravenous Q1 Hr x 4  . tiotropium  18 mcg Inhalation Daily  . DISCONTD: ALPRAZolam  0.25 mg Oral TID  . DISCONTD: HYDROcodone-acetaminophen  1 tablet Oral TID  . DISCONTD: pantoprazole  40 mg Oral Q1200   Continuous Infusions:    . sodium chloride 75 mL/hr at 08/08/12 1700    ________________________________________________________________________  Time spent: 35 min    [facility]  [facility] Pager [phone] If 8PM-8AM, please contact night-coverage at [email or web address], password [facility] 08/08/2012, 5:19 PM  LOS: 2 days

## 2012-08-08 NOTE — Consult Note (Signed)
Reason for Consult:SBO Referring Physician: Dr. Karlyne Greenspan  [name] [name] [name] is an 77 y.o. female.  HPI: this is a 77 year old female who was admitted with renal failure, urinary tract infection, and increasing creatinine. She was transferred down from [facility] where she had a several-day history of nausea, vomiting, and abdominal pain. She has no previous history of bowel obstruction. She had a CAT scan of the abdomen and pelvis on August 14 with oral contrast only to evaluate her abdominal aortic aneurysm which was being followed. The CAT scan at that time showed no evidence of hernia or bowel obstruction. Since admission, she has had progressive distention. A nasogastric tube is in place. Her plain abdominal x-rays still show significant distention of the small bowel. There is contrast in the colon. The patient reports crampy abdominal pain. It is mildly diffuse. She is not passing flatus. It is uncertain of her last bowel movement  Past Medical History  Diagnosis Date  . Hypertension   . Hyperlipidemia   . COPD (chronic obstructive pulmonary disease)   . Arthritis   . Depression with anxiety   . GERD (gastroesophageal reflux disease)   . Hiatal hernia   . Atrial fibrillation   . Productive cough   . Leg pain   . AAA (abdominal aortic aneurysm)   . CAD (coronary artery disease)   . Chronic kidney disease     Past Surgical History  Procedure Date  . Kidney stone surgery   . Cystectomy     cyst removal on pituitary gland    Family History  Problem Relation Age of Onset  . Heart disease Mother   . Hyperlipidemia Mother   . Hypertension Mother   . Stroke Mother   . Cancer Brother     Social History:  reports that she has quit smoking. Her smoking use included Cigarettes. She quit smokeless tobacco use about 4 years ago. She reports that she does not drink alcohol or use illicit drugs.  Allergies:  Allergies  Allergen Reactions  . Asa (Aspirin) Other (See Comments)    "my ears  bleed"  . Lidocaine Other (See Comments)    unknown  . Nsaids Other (See Comments)    unknown  . Sulfonamide Derivatives Other (See Comments)    unknown    Medications: I have reviewed the patient's current medications.  Results for orders placed during the hospital encounter of 08/06/12 (from the past 48 hour(s))  CBC WITH DIFFERENTIAL     Status: Abnormal   Collection Time   08/06/12  9:43 PM      Component Value Range Comment   WBC 5.7  4.0 - 10.5 K/uL WHITE COUNT CONFIRMED ON SMEAR   RBC 4.14  3.87 - 5.11 MIL/uL    Hemoglobin 11.6 (*) 12.0 - 15.0 g/dL    HCT 16.1 (*) 09.6 - 46.0 %    MCV 81.9  78.0 - 100.0 fL    MCH 28.0  26.0 - 34.0 pg    MCHC 34.2  30.0 - 36.0 g/dL    RDW 04.5  40.9 - 81.1 %    Platelets 199  150 - 400 K/uL PLATELET COUNT CONFIRMED BY SMEAR   Neutrophils Relative 74  43 - 77 %    Lymphocytes Relative 18  12 - 46 %    Monocytes Relative 8  3 - 12 %    Eosinophils Relative 0  0 - 5 %    Basophils Relative 0  0 - 1 %  Band Neutrophils 0  0 - 10 %    Metamyelocytes Relative 0      Myelocytes 0      Promyelocytes Absolute 0      Blasts 0      nRBC 0  0 /100 WBC    Neutro Abs 4.2  1.7 - 7.7 K/uL    Lymphs Abs 1.0  0.7 - 4.0 K/uL    Monocytes Absolute 0.5  0.1 - 1.0 K/uL    Eosinophils Absolute 0.0  0.0 - 0.7 K/uL    Basophils Absolute 0.0  0.0 - 0.1 K/uL    RBC Morphology POLYCHROMASIA PRESENT      WBC Morphology TOXIC GRANULATION   INCREASED BANDS (>20% BANDS)   Smear Review LARGE PLATELETS PRESENT     COMPREHENSIVE METABOLIC PANEL     Status: Abnormal   Collection Time   08/06/12  9:43 PM      Component Value Range Comment   Sodium 133 (*) 135 - 145 mEq/L    Potassium 3.7  3.5 - 5.1 mEq/L    Chloride 88 (*) 96 - 112 mEq/L    CO2 19  19 - 32 mEq/L    Glucose, Bld 91  70 - 99 mg/dL    BUN 161 (*) 6 - 23 mg/dL    Creatinine, Ser 0.96 (*) 0.50 - 1.10 mg/dL    Calcium 6.7 (*) 8.4 - 10.5 mg/dL    Total Protein 6.6  6.0 - 8.3 g/dL    Albumin 1.9  (*) 3.5 - 5.2 g/dL    AST 045 (*) 0 - 37 U/L    ALT 76 (*) 0 - 35 U/L    Alkaline Phosphatase 98  39 - 117 U/L    Total Bilirubin 1.1  0.3 - 1.2 mg/dL    GFR calc non Af Amer 7 (*) >90 mL/min    GFR calc Af Amer 8 (*) >90 mL/min   MRSA PCR SCREENING     Status: Normal   Collection Time   08/06/12 11:43 PM      Component Value Range Comment   MRSA by PCR NEGATIVE  NEGATIVE   URINALYSIS, ROUTINE W REFLEX MICROSCOPIC     Status: Abnormal   Collection Time   08/06/12 11:57 PM      Component Value Range Comment   Color, Urine YELLOW  YELLOW    APPearance CLOUDY (*) CLEAR    Specific Gravity, Urine 1.012  1.005 - 1.030    pH 5.0  5.0 - 8.0    Glucose, UA NEGATIVE  NEGATIVE mg/dL    Hgb urine dipstick LARGE (*) NEGATIVE    Bilirubin Urine NEGATIVE  NEGATIVE    Ketones, ur NEGATIVE  NEGATIVE mg/dL    Protein, ur 30 (*) NEGATIVE mg/dL    Urobilinogen, UA 1.0  0.0 - 1.0 mg/dL    Nitrite POSITIVE (*) NEGATIVE    Leukocytes, UA MODERATE (*) NEGATIVE   URINE MICROSCOPIC-ADD ON     Status: Abnormal   Collection Time   08/06/12 11:57 PM      Component Value Range Comment   Squamous Epithelial / LPF RARE  RARE    WBC, UA 21-50  <3 WBC/hpf WBC CLUSTERS   RBC / HPF 3-6  <3 RBC/hpf    Bacteria, UA FEW (*) RARE    Casts HYALINE CASTS (*) NEGATIVE    Urine-Other AMORPHOUS URATES/PHOSPHATES     CORTISOL     Status: Normal   Collection Time  08/07/12  1:00 AM      Component Value Range Comment   Cortisol, Plasma 21.1     LACTIC ACID, PLASMA     Status: Normal   Collection Time   08/07/12  1:00 AM      Component Value Range Comment   Lactic Acid, Venous 0.6  0.5 - 2.2 mmol/L   TSH     Status: Normal   Collection Time   08/07/12  1:00 AM      Component Value Range Comment   TSH 1.874  0.350 - 4.500 uIU/mL   BASIC METABOLIC PANEL     Status: Abnormal   Collection Time   08/07/12  2:59 AM      Component Value Range Comment   Sodium 135  135 - 145 mEq/L    Potassium 3.0 (*) 3.5 - 5.1 mEq/L      Chloride 89 (*) 96 - 112 mEq/L    CO2 19  19 - 32 mEq/L    Glucose, Bld 86  70 - 99 mg/dL    BUN 161 (*) 6 - 23 mg/dL    Creatinine, Ser 0.96 (*) 0.50 - 1.10 mg/dL    Calcium 6.9 (*) 8.4 - 10.5 mg/dL    GFR calc non Af Amer 7 (*) >90 mL/min    GFR calc Af Amer 8 (*) >90 mL/min   CBC     Status: Abnormal   Collection Time   08/07/12  2:59 AM      Component Value Range Comment   WBC 5.3  4.0 - 10.5 K/uL    RBC 4.03  3.87 - 5.11 MIL/uL    Hemoglobin 11.5 (*) 12.0 - 15.0 g/dL    HCT 04.5 (*) 40.9 - 46.0 %    MCV 81.6  78.0 - 100.0 fL    MCH 28.5  26.0 - 34.0 pg    MCHC 35.0  30.0 - 36.0 g/dL    RDW 81.1  91.4 - 78.2 %    Platelets 177  150 - 400 K/uL   CREATININE, URINE, RANDOM     Status: Normal   Collection Time   08/07/12  5:12 PM      Component Value Range Comment   Creatinine, Urine 37.85     SODIUM, URINE, RANDOM     Status: Normal   Collection Time   08/07/12  5:12 PM      Component Value Range Comment   Sodium, Ur 84     BASIC METABOLIC PANEL     Status: Abnormal   Collection Time   08/08/12  8:35 AM      Component Value Range Comment   Sodium 137  135 - 145 mEq/L    Potassium 3.2 (*) 3.5 - 5.1 mEq/L    Chloride 90 (*) 96 - 112 mEq/L    CO2 19  19 - 32 mEq/L    Glucose, Bld 68 (*) 70 - 99 mg/dL    BUN 956 (*) 6 - 23 mg/dL    Creatinine, Ser 2.13 (*) 0.50 - 1.10 mg/dL    Calcium 8.1 (*) 8.4 - 10.5 mg/dL    GFR calc non Af Amer 8 (*) >90 mL/min    GFR calc Af Amer 9 (*) >90 mL/min   CBC     Status: Abnormal   Collection Time   08/08/12  8:35 AM      Component Value Range Comment   WBC 6.1  4.0 - 10.5 K/uL    RBC  4.29  3.87 - 5.11 MIL/uL    Hemoglobin 12.2  12.0 - 15.0 g/dL    HCT 16.1 (*) 09.6 - 46.0 %    MCV 82.3  78.0 - 100.0 fL    MCH 28.4  26.0 - 34.0 pg    MCHC 34.6  30.0 - 36.0 g/dL    RDW 04.5  40.9 - 81.1 %    Platelets 206  150 - 400 K/uL     Dg Abd Portable 2v  08/08/2012  *RADIOLOGY REPORT*  Clinical Data: Evaluate small bowel obstruction.   Question worsening.  PORTABLE ABDOMEN - 2 VIEW  Comparison: 08/07/2012  Findings: Nasogastric tube tip overlies the level of the stomach. There is persistent significant dilatation of small bowel loops. No free intraperitoneal air on left lateral decubitus view.  There is persistent contrast, likely within the colon, showing little interval progression since previous exam.  IMPRESSION: Persistent high-grade small bowel obstruction.  Original Report Authenticated By: Patterson Hammersmith, M.D.   Dg Abd Portable 2v  08/07/2012  *RADIOLOGY REPORT*  Clinical Data: Abdominal distention and decreased bowel sounds.  PORTABLE ABDOMEN - 2 VIEW  Comparison: CT abdomen and pelvis 08/04/2012.  Findings: Multiple dilated loops of small bowel measuring up to 5.8 cm are identified.  No free intraperitoneal air is identified.  IMPRESSION: Bowel gas pattern compatible with small bowel obstruction.  Original Report Authenticated By: Bernadene Bell. Maricela Curet, M.D.    Review of Systems  All other systems reviewed and are negative.   Blood pressure 87/49, pulse 67, temperature 97.5 F (36.4 C), temperature source Oral, resp. rate 18, height 5\' 4"  (1.626 m), weight 187 lb 6.3 oz (85 kg), SpO2 97.00%. Physical Exam  Constitutional: She is oriented to person, place, and time. She appears well-developed and well-nourished.       Mildly uncomfortable in appearance  HENT:  Head: Normocephalic and atraumatic.  Right Ear: External ear normal.  Left Ear: External ear normal.  Nose: Nose normal.  Mouth/Throat: Oropharynx is clear and moist. No oropharyngeal exudate.  Eyes: Conjunctivae are normal. Pupils are equal, round, and reactive to light. No scleral icterus.  Neck: Normal range of motion. Neck supple. No JVD present. No tracheal deviation present.  Cardiovascular: Normal rate, regular rhythm, normal heart sounds and intact distal pulses.   No murmur heard. Respiratory: Effort normal. No respiratory distress. She has no  wheezes. She has rales.  GI: She exhibits distension. She exhibits no mass. There is tenderness. There is no rebound.       Her abdomen is obese. It is distended. I can palpate a small umbilical hernia. She has a well-healed left flank incision without evidence of hernia. Her abdomen is mildly tender.  Musculoskeletal: Normal range of motion. She exhibits no edema and no tenderness.  Lymphadenopathy:    She has no cervical adenopathy.  Neurological: She is alert and oriented to person, place, and time.  Skin: Skin is warm and dry. No rash noted. No erythema.  Psychiatric: Her behavior is normal. Judgment normal.    Assessment/Plan: Small bowel obstruction versus ileus . I reviewed her CAT scan and I suspect she does have a small umbilical hernia containing only fat. Given the films showing contrast in her colon, this may represent ileus. This still could be consistent with a small bowel stricture from adhesions. I will continue her bowel rest and NG decompression. She may need a larger nasogastric tube. On going to start flushing this tube with saline. I would also  order a suppository and Reglan. I will order repeat abdominal x-rays for in the morning. We will follow her closely.  Camaryn Lumbert A 08/08/2012, 1:48 PM

## 2012-08-09 ENCOUNTER — Inpatient Hospital Stay (HOSPITAL_COMMUNITY): Payer: Medicare Other

## 2012-08-09 DIAGNOSIS — N179 Acute kidney failure, unspecified: Secondary | ICD-10-CM

## 2012-08-09 LAB — BASIC METABOLIC PANEL
CO2: 16 mEq/L — ABNORMAL LOW (ref 19–32)
Chloride: 93 mEq/L — ABNORMAL LOW (ref 96–112)
Creatinine, Ser: 4.51 mg/dL — ABNORMAL HIGH (ref 0.50–1.10)
Glucose, Bld: 68 mg/dL — ABNORMAL LOW (ref 70–99)

## 2012-08-09 LAB — CBC
HCT: 34.3 % — ABNORMAL LOW (ref 36.0–46.0)
MCH: 28 pg (ref 26.0–34.0)
MCV: 82.7 fL (ref 78.0–100.0)
RBC: 4.15 MIL/uL (ref 3.87–5.11)
RDW: 15.7 % — ABNORMAL HIGH (ref 11.5–15.5)
WBC: 7 10*3/uL (ref 4.0–10.5)

## 2012-08-09 LAB — URINE CULTURE

## 2012-08-09 MED ORDER — SODIUM CHLORIDE 0.9 % IJ SOLN
INTRAMUSCULAR | Status: AC
  Filled 2012-08-09: qty 10

## 2012-08-09 MED ORDER — SODIUM CHLORIDE 0.9 % IV BOLUS (SEPSIS)
500.0000 mL | Freq: Once | INTRAVENOUS | Status: AC
  Administered 2012-08-09: 500 mL via INTRAVENOUS

## 2012-08-09 MED ORDER — POTASSIUM CHLORIDE 10 MEQ/100ML IV SOLN
10.0000 meq | INTRAVENOUS | Status: AC
  Administered 2012-08-09 (×3): 10 meq via INTRAVENOUS
  Filled 2012-08-09 (×3): qty 100

## 2012-08-09 NOTE — Progress Notes (Signed)
Clinical Social Work  CSW received call from [employer] (representative at [facility]) in regards to patient. Rep stated that patient is active with HH through agency and would need orders resumed if she returns. Patient has a wheelchair at ALF and is able to transfer herself to and from wheelchair. If patient needs PT or OT it can be arranged through [facility] at ALF. Rep prefers to be updated on any dc plans at [phone]. CSW will continue to follow.  [place], [place] [phone]

## 2012-08-09 NOTE — Progress Notes (Signed)
  Subjective: BM after supp, passed some gas today, feeling better than yesterday  Objective: Vital signs in last 24 hours: Temp:  [97.4 F (36.3 C)-98.2 F (36.8 C)] 97.4 F (36.3 C) (08/19 0800) Pulse Rate:  [63-119] 69  (08/19 0800) Resp:  [11-17] 11  (08/19 0800) BP: (89-119)/(48-60) 104/54 mmHg (08/19 0800) SpO2:  [91 %-98 %] 93 % (08/19 0800) Last BM Date: 08/09/12  Intake/Output from previous day: 08/18 0701 - 08/19 0700 In: 2315 [I.V.:1725; NG/GT:190; IV Piggyback:400] Out: 1350 [Urine:975; Emesis/NG output:375] Intake/Output this shift: Total I/O In: 180 [I.V.:150; NG/GT:30] Out: 350 [Urine:350]  General appearance: alert and cooperative Resp: clear to auscultation bilaterally Cardio: regular rate and rhythm GI: distended but soft, mild R tenderness, a few BS present  Lab Results:   Basename 08/09/12 0530 08/08/12 0835  WBC 7.0 6.1  HGB 11.6* 12.2  HCT 34.3* 35.3*  PLT 220 206   BMET  Basename 08/09/12 0530 08/08/12 0835  NA 139 137  K 3.1* 3.2*  CL 93* 90*  CO2 16* 19  GLUCOSE 68* 68*  BUN 124* 125*  CREATININE 4.51* 5.12*  CALCIUM 8.4 8.1*   PT/INR No results found for this basename: LABPROT:2,INR:2 in the last 72 hours ABG No results found for this basename: PHART:2,PCO2:2,PO2:2,HCO3:2 in the last 72 hours  Studies/Results: Dg Abd Portable 1v  08/09/2012  *RADIOLOGY REPORT*  Clinical Data: Abdominal distention  PORTABLE ABDOMEN - 1 VIEW  Comparison: 08/18  Findings: There is persistent small bowel dilatation, similar to the previous exam, consistent with small bowel obstruction.  Small amount of gas present in the colon.  Nasogastric tube has its tip in the stomach.  IMPRESSION: Persistent small bowel obstruction pattern.   Original Report Authenticated By: Thomasenia Sales, M.D. ( 08/09/2012 07:48:04 )    Dg Abd Portable 2v  08/08/2012  *RADIOLOGY REPORT*  Clinical Data: Evaluate small bowel obstruction.  Question worsening.  PORTABLE ABDOMEN -  2 VIEW  Comparison: 08/07/2012  Findings: Nasogastric tube tip overlies the level of the stomach. There is persistent significant dilatation of small bowel loops. No free intraperitoneal air on left lateral decubitus view.  There is persistent contrast, likely within the colon, showing little interval progression since previous exam.  IMPRESSION: Persistent high-grade small bowel obstruction.  Original Report Authenticated By: Patterson Hammersmith, M.D.   Dg Abd Portable 2v  08/07/2012  *RADIOLOGY REPORT*  Clinical Data: Abdominal distention and decreased bowel sounds.  PORTABLE ABDOMEN - 2 VIEW  Comparison: CT abdomen and pelvis 08/04/2012.  Findings: Multiple dilated loops of small bowel measuring up to 5.8 cm are identified.  No free intraperitoneal air is identified.  IMPRESSION: Bowel gas pattern compatible with small bowel obstruction.  Original Report Authenticated By: Bernadene Bell. Maricela Curet, M.D.    Anti-infectives: Anti-infectives     Start     Dose/Rate Route Frequency Ordered Stop   08/07/12 0930   cefTRIAXone (ROCEPHIN) 1 g in dextrose 5 % 50 mL IVPB        1 g 100 mL/hr over 30 Minutes Intravenous Every 24 hours 08/07/12 0845            Assessment/Plan: s/p * No surgery found * PSBO vs Ileus - continue NGT today, seems to be improving clinically but will follow closely ARF - per primary team, some improvement  LOS: 3 days    Matilyn Fehrman E 08/09/2012

## 2012-08-09 NOTE — Progress Notes (Signed)
Utilization review completed.  

## 2012-08-09 NOTE — Progress Notes (Signed)
Clinical Social Work Department BRIEF PSYCHOSOCIAL ASSESSMENT 08/09/2012  Patient:  [name] [name], [name] [name]     Account Number:  [id number]     Admit date:  08/06/2012  Clinical Social Worker:  Dennison Bulla  Date/Time:  08/09/2012 11:00 AM  Referred by:  Physician  Date Referred:  08/09/2012 Referred for  SNF Placement   Other Referral:   Interview type:  Patient Other interview type:   Son    PSYCHOSOCIAL DATA Living Status:  FACILITY Admitted from facility:   Level of care:  Assisted Living Primary support name:  [name] [name] Primary support relationship to patient:  CHILD, ADULT Degree of support available:   Strong    CURRENT CONCERNS Current Concerns  Post-Acute Placement   Other Concerns:    SOCIAL WORK ASSESSMENT / PLAN CSW received referral due to patient being admitted from ALF. CSW reviewed chart and met with patient at bedside. No visitors were present.    CSW introduced myself and explained role. Patient reported that prior to admission she was living at ALF. Patient has been a resident at [facility] for the past 3.5 years. Patient gave me permission to discuss dc plans with family. CSW spoke with son [name] [name]) who confirmed this information and is agreeable to patient returning back to ALF as well. CSW called ALF who reported that patient can return at dc.    Patient is currently on bedrest so her ambulation status is uncertain at this time. FL2 has been completed and placed on chart for MD signature. FL2 will need to be updated at dc. CSW will continue to follow.   Assessment/plan status:  Psychosocial Support/Ongoing Assessment of Needs Other assessment/ plan:   Information/referral to community resources:   Will return to ALF    PATIENT'S/FAMILY'S RESPONSE TO PLAN OF CARE: Patient was alert and able to engage in assessment. Patient was confused about time but able to report that she was at ALF. Patient and son appreciative of CSW consult.

## 2012-08-09 NOTE — Progress Notes (Signed)
[facility] Progress Note [place] TEAM 1 - Stepdown/ICU TEAM   [name] M Ervine AVW:[id number] DOB: 04-Apr-1935 DOA: 08/06/2012 PCP: Fredirick Maudlin, MD  Brief narrative: 77 y.o. Female who was transferred from [facility] in [place] to [place] due to Hypotension, ABD Pain, and UTI along with a history of an AAA which is being followed by Dr, Waverly Ferrari. She lives at an Assisted Living facility [facility]) and has had complaints of Abd Pain, nausea and dry heaves X 3 days. She denied constipation, or diarrhea. She also denied having fevers or chills. She had worsening weakness.   Assessment/Plan:  ARF on CKD 4- non-oliguric  - Baseline Cr about 1.7-1.9 (GFR < 30)  - CT abd/pelvis on 8/14 done with contrast - cause of her renal failure is likely ATN superimposed on dehydration - she is on Demedex at home and previous BUN/ Cr ratios noted to have been elevated.  - FeNa 8.93% - Consistent with probable ATN  -Hold nephrotoxic drugs and cut back on other medications that have tendency to accumulate in ARF (Xanax, Narcotics)   Hypotension - persistant Due to dehydraton vs accumulation of anti-hypertenisves in setting of ARF - Cont to hydrate - check echocardiogram to rule out significant pericardial effusion given the patient's marked uremia   PSBO vs/ Illeus  Xray abdomen consistent with high grade obstruction - NG tube placed - Surgery consulted - this may be an ileus from azotemia but due to h/o prior surgery, need to keep in mind that she likely has adhesions - continue conservative measures for now   UTI / pyelonephritis Rocephin - cultures were not ordered in ER - keep in mind pt has already been given antibiotics and therefore our followup culture may be sterile   Hypokalemia Replace gently and follow - goal is 4.0 in this clinical setting - check magnesium  AAA (abdominal aortic aneurysm)  Being followed as oupt - CT performed on 8/14 - results reveal a  stable 5.4 cm infrarenal aneurysm w/o leak or rupture   COPD  Stable currently   Code Status: Full Disposition Plan: Remain in step down unit  Consultants: Vascular surgery - signed off General surgery  Procedures: None  Antibiotics: Rocephin 08/06/2012  DVT prophylaxis: SCDs  HPI/Subjective: The patient is remarkably alert and oriented at the present time.  She complains of some persistent nausea and abdominal discomfort but denies chest pain shortness of breath fevers or chills.  She admits that she has not had a significant bowel movement.   Objective: Blood pressure 96/53, pulse 106, temperature 98.1 F (36.7 C), temperature source Oral, resp. rate 12, height 5\' 4"  (1.626 m), weight 85 kg (187 lb 6.3 oz), SpO2 92.00%.  Intake/Output Summary (Last 24 hours) at 08/09/12 1722 Last data filed at 08/09/12 1618  Gross per 24 hour  Intake   1625 ml  Output   1975 ml  Net   -350 ml     Exam: General: No acute respiratory distress at rest Lungs: Clear to auscultation bilaterally without wheezes or crackles Cardiovascular: Tachycardic but regular without gallop rub or appreciable murmur Abdomen: Diffusely distended but not tense, bowel sounds hypoactive, no focal mass, no rebound Extremities: No significant cyanosis, clubbing, or edema bilateral lower extremities  Data Reviewed: Basic Metabolic Panel:  Lab 08/09/12 8295 08/08/12 0835 08/07/12 0259 08/06/12 2143  NA 139 137 135 133*  K 3.1* 3.2* 3.0* 3.7  CL 93* 90* 89* 88*  CO2 16* 19 19 19   GLUCOSE  68* 68* 86 91  BUN 124* 125* 123* 122*  CREATININE 4.51* 5.12* 5.43* 5.55*  CALCIUM 8.4 8.1* 6.9* 6.7*  MG -- -- -- --  PHOS -- -- -- --   Liver Function Tests:  Lab 08/06/12 2143  AST 266*  ALT 76*  ALKPHOS 98  BILITOT 1.1  PROT 6.6  ALBUMIN 1.9*   CBC:  Lab 08/09/12 0530 08/08/12 0835 08/07/12 0259 08/06/12 2143  WBC 7.0 6.1 5.3 5.7  NEUTROABS -- -- -- 4.2  HGB 11.6* 12.2 11.5* 11.6*  HCT 34.3* 35.3*  32.9* 33.9*  MCV 82.7 82.3 81.6 81.9  PLT 220 206 177 199   BNP (last 3 results)  Basename 05/23/12 1326  PROBNP 484.0*    Recent Results (from the past 240 hour(s))  MRSA PCR SCREENING     Status: Normal   Collection Time   08/06/12 11:43 PM      Component Value Range Status Comment   MRSA by PCR NEGATIVE  NEGATIVE Final   URINE CULTURE     Status: Normal   Collection Time   08/07/12  5:13 PM      Component Value Range Status Comment   Specimen Description URINE, CLEAN CATCH   Final    Special Requests NONE   Final    Culture  Setup Time 08/08/2012 03:48   Final    Colony Count 10,000 COLONIES/ML   Final    Culture     Final    Value: Multiple bacterial morphotypes present, none predominant. Suggest appropriate recollection if clinically indicated.   Report Status 08/09/2012 FINAL   Final      Studies:  Recent x-ray studies have been reviewed in detail by the Attending Physician  Scheduled Meds:  Reviewed in detail by the Attending Physician   Lonia Blood, MD [facility] Office  [phone] Pager [phone]  On-Call/Text Page:      [email or web address]      password [email or web address]  If 7PM-7AM, please contact night-coverage [email or web address] Password [email or web address] 08/09/2012, 5:22 PM   LOS: 3 days

## 2012-08-10 ENCOUNTER — Inpatient Hospital Stay (HOSPITAL_COMMUNITY): Payer: Medicare Other

## 2012-08-10 DIAGNOSIS — I10 Essential (primary) hypertension: Secondary | ICD-10-CM

## 2012-08-10 LAB — HEPATIC FUNCTION PANEL
ALT: 84 U/L — ABNORMAL HIGH (ref 0–35)
AST: 186 U/L — ABNORMAL HIGH (ref 0–37)
Alkaline Phosphatase: 141 U/L — ABNORMAL HIGH (ref 39–117)
Bilirubin, Direct: 0.4 mg/dL — ABNORMAL HIGH (ref 0.0–0.3)
Total Bilirubin: 0.5 mg/dL (ref 0.3–1.2)

## 2012-08-10 LAB — BASIC METABOLIC PANEL
BUN: 114 mg/dL — ABNORMAL HIGH (ref 6–23)
CO2: 16 mEq/L — ABNORMAL LOW (ref 19–32)
Chloride: 101 mEq/L (ref 96–112)
Creatinine, Ser: 3.43 mg/dL — ABNORMAL HIGH (ref 0.50–1.10)

## 2012-08-10 MED ORDER — POTASSIUM CHLORIDE 10 MEQ/100ML IV SOLN
10.0000 meq | INTRAVENOUS | Status: AC
  Administered 2012-08-11 (×4): 10 meq via INTRAVENOUS
  Filled 2012-08-10: qty 400

## 2012-08-10 MED ORDER — LORAZEPAM 2 MG/ML IJ SOLN
1.0000 mg | Freq: Once | INTRAMUSCULAR | Status: AC
  Administered 2012-08-10: 1 mg via INTRAVENOUS
  Filled 2012-08-10: qty 1

## 2012-08-10 MED ORDER — SODIUM CHLORIDE 0.9 % IJ SOLN
INTRAMUSCULAR | Status: AC
  Filled 2012-08-10: qty 10

## 2012-08-10 NOTE — Progress Notes (Signed)
[facility] PROGRESS NOTE  [name] M Vossler NWG:[id number] DOB: [birth year] DOA: 08/06/2012 PCP: Fredirick Maudlin, MD  Assessment/Plan: Principal Problem:  *ARF on CKD 4- non-oliguric - Baseline Cr about 1/7-1/9- GFR < 30 -CT abd/pelvis on 8/14 in [facility] noted to be done with contrast- Pt confirm that contrast was used- therefore, the cause of her renal failure is likely ATN superimposed on dehydration- she is on Demedex at home and previous BUN/ Cr ratios noted to have been elevated.  - FeNa 8.93%-  Consistent with probable  ATN Holding nephrotoxic drugs and have cut back on other medications that have tendency to accumulate in ARF (Xanax, Narcotics) - Cr continues to improve  Active Problems:  Hypotension Due to dehydraton vs accumulation of anti-hypertenisves in setting of ARF Cont to hydrate while NPO- f/u ECHO BP is steadily improving now- may need to initiate PRN IV antihypertensives in next 24-48 hrs.    Abdominal  Pain/ PSBO Surgery following- pt beginning to have BMs, xray reveals contrast has reached colon  UTI Rocephin- cultures reveal 10, 000 colonies of multiple morphotypes- d/c Rocephin  Hypokalemia Replace and follow   AAA (abdominal aortic aneurysm) Being followed as oupt- CT performed on 8/14- results reveal a stable 5.4 cm infrarenal aneurysm w/o leak or rupture- elective surgery as outpt   COPD Stable currently    Code Status: full code Family Communication: -  Disposition Plan: follow in SDU- start to mobilize pt   Brief narrative: [name] [name] is an 77 y.o. Female who was transferred from [facility] in [place] to [place] due to Hypotension , ABD Pain and UTI along with a history of an AAA which is being followed by Dr, Jaynie Collins. She lives at an Assisted Living facility [facility]) and has had complaints of Abd Pain, nausea and dry heaves X 3 days, She denies constipation, and diarrhea. She also Denies having fevers or chills. She  has had worsening weakness. She told her family today that she needed to go to the hospital.  Antibiotics:  Rocephin- 8/16  HPI/Subjective: Pt alert - continues to have abdominal discomfort- spitting up small amouts of bilious materials  Objective: Filed Vitals:   08/10/12 1113 08/10/12 1200 08/10/12 1600 08/10/12 1932  BP:  115/63 135/71   Pulse:  67 69   Temp:  98.7 F (37.1 C) 97.4 F (36.3 C) 98.4 F (36.9 C)  TempSrc:  Oral Oral Axillary  Resp:  15 29   Height:      Weight:      SpO2: 95% 94% 91%     Intake/Output Summary (Last 24 hours) at 08/10/12 2305 Last data filed at 08/10/12 1932  Gross per 24 hour  Intake 2387.5 ml  Output   2750 ml  Net -362.5 ml    Exam:   General:  No distress, alert  Cardiovascular: RRR, No murmurs  Respiratory: CTA b/l  Abdomen: soft, distended, bowel sounds not audible, tympanic to percussion  Ext: no c/c/e  Data Reviewed: Basic Metabolic Panel:  Lab 08/10/12 5784 08/09/12 0530 08/08/12 0835 08/07/12 0259 08/06/12 2143  NA 144 139 137 135 133*  K 3.1* 3.1* 3.2* 3.0* 3.7  CL 101 93* 90* 89* 88*  CO2 16* 16* 19 19 19   GLUCOSE 86 68* 68* 86 91  BUN 114* 124* 125* 123* 122*  CREATININE 3.43* 4.51* 5.12* 5.43* 5.55*  CALCIUM 8.7 8.4 8.1* 6.9* 6.7*  MG 2.4 -- -- -- --  PHOS -- -- -- -- --  Liver Function Tests:  Lab 08/10/12 0345 08/06/12 2143  AST 186* 266*  ALT 84* 76*  ALKPHOS 141* 98  BILITOT 0.5 1.1  PROT 6.4 6.6  ALBUMIN 1.9* 1.9*   No results found for this basename: LIPASE:5,AMYLASE:5 in the last 168 hours No results found for this basename: AMMONIA:5 in the last 168 hours CBC:  Lab 08/09/12 0530 08/08/12 0835 08/07/12 0259 08/06/12 2143  WBC 7.0 6.1 5.3 5.7  NEUTROABS -- -- -- 4.2  HGB 11.6* 12.2 11.5* 11.6*  HCT 34.3* 35.3* 32.9* 33.9*  MCV 82.7 82.3 81.6 81.9  PLT 220 206 177 199   Cardiac Enzymes: No results found for this basename: CKTOTAL:5,CKMB:5,CKMBINDEX:5,TROPONINI:5 in the last 168  hours BNP (last 3 results)  Basename 05/23/12 1326  PROBNP 484.0*   CBG: No results found for this basename: GLUCAP:5 in the last 168 hours  Recent Results (from the past 240 hour(s))  MRSA PCR SCREENING     Status: Normal   Collection Time   08/06/12 11:43 PM      Component Value Range Status Comment   MRSA by PCR NEGATIVE  NEGATIVE Final   URINE CULTURE     Status: Normal   Collection Time   08/07/12  5:13 PM      Component Value Range Status Comment   Specimen Description URINE, CLEAN CATCH   Final    Special Requests NONE   Final    Culture  Setup Time 08/08/2012 03:48   Final    Colony Count 10,000 COLONIES/ML   Final    Culture     Final    Value: Multiple bacterial morphotypes present, none predominant. Suggest appropriate recollection if clinically indicated.   Report Status 08/09/2012 FINAL   Final      Studies: Ct Abdomen Pelvis Wo Contrast  08/04/2012  *RADIOLOGY REPORT*  Clinical Data: Follow-up abdominal aortic aneurysm.  CT ABDOMEN AND PELVIS WITHOUT CONTRAST  Technique:  Multidetector CT imaging of the abdomen and pelvis was performed following the standard protocol without intravenous contrast.  Comparison: 12/04/2011  Findings: Infrarenal abdominal aortic aneurysm measures 5.4 x 5.4 cm, without significant change in size since previous study.  No evidence of aneurysm leak or rupture.  No evidence of iliac artery aneurysm.  Tiny nonobstructing left intrarenal calculus again noted measuring less than 5 mm.  Bilateral renal parenchymal atrophy again demonstrated, however there is no evidence of renal mass or hydronephrosis.  The other abdominal parenchymal organs have a normal appearance on this noncontrast study. Small calcified gallstones seen in the gallbladder fundus, however there is no evidence of cholecystitis.  Uterus and adnexa are unremarkable in appearance.  No soft tissue masses or lymphadenopathy identified.  No evidence of inflammatory process or abnormal fluid  collections.  Severe bilateral hip DJD also noted with chronic collapse of the articular surface of both femoral heads.  IMPRESSION:  1.  Stable 5.4 cm infrarenal abdominal aortic aneurysm.  No evidence of aneurysm leak or rupture. 2.  Nonobstructing left nephrolithiasis and cholelithiasis.  Original Report Authenticated By: Danae Orleans, M.D.    Scheduled Meds:    . cefTRIAXone (ROCEPHIN)  IV  1 g Intravenous Q24H  . LORazepam  1 mg Intravenous Once  . pantoprazole (PROTONIX) IV  40 mg Intravenous Q1200  . sodium chloride      . sodium chloride      . tiotropium  18 mcg Inhalation Daily   Continuous Infusions:    . sodium chloride 125 mL/hr at  08/10/12 2303    ________________________________________________________________________  Time spent: 35 min    [facility]  [facility] Pager [phone] If 8PM-8AM, please contact night-coverage at [email or web address], password [facility] 08/10/2012, 11:05 PM  LOS: 4 days

## 2012-08-10 NOTE — Progress Notes (Addendum)
Two liquid BMs this AM but x-ray still shows some dilated SB loops.  CT contrast has all passed into colon from previous CT scan..  Continue NGT today. Patient examined and I agree with the assessment and plan  Violeta Gelinas, MD, MPH, FACS Pager: [phone]  08/10/2012 10:46 AM

## 2012-08-10 NOTE — Progress Notes (Signed)
Patient ID: [name] [name], female   DOB: 1935-12-18, 77 y.o.   MRN: [id number]    Subjective: +BM yesterday per patient and passing gas.  Also having nausea per patient.  Denies abd pain at current.    Objective: Vital signs in last 24 hours: Temp:  [97.5 F (36.4 C)-98.1 F (36.7 C)] 97.5 F (36.4 C) (08/20 0700) Pulse Rate:  [72-107] 72  (08/20 0400) Resp:  [12-18] 14  (08/20 0400) BP: (82-116)/(47-63) 100/57 mmHg (08/20 0400) SpO2:  [91 %-98 %] 98 % (08/20 0400) Last BM Date: 08/09/12  Intake/Output from previous day: 08/19 0701 - 08/20 0700 In: 3505 [I.V.:3175; NG/GT:180; IV Piggyback:150] Out: 2600 [Urine:2400; Emesis/NG output:200] Intake/Output this shift:   Physical Exam: General; awake, alert, no acute distress Resp: clear to auscultation bilaterally Cardio: regular rate and rhythm GI: distended but soft, mild tenderness, faint bs  Lab Results:   Basename 08/09/12 0530 08/08/12 0835  WBC 7.0 6.1  HGB 11.6* 12.2  HCT 34.3* 35.3*  PLT 220 206   BMET  Basename 08/10/12 0345 08/09/12 0530  NA 144 139  K 3.1* 3.1*  CL 101 93*  CO2 16* 16*  GLUCOSE 86 68*  BUN 114* 124*  CREATININE 3.43* 4.51*  CALCIUM 8.7 8.4   PT/INR No results found for this basename: LABPROT:2,INR:2 in the last 72 hours ABG No results found for this basename: PHART:2,PCO2:2,PO2:2,HCO3:2 in the last 72 hours  Studies/Results: Dg Abd Portable 1v  08/09/2012  *RADIOLOGY REPORT*  Clinical Data: Abdominal distention  PORTABLE ABDOMEN - 1 VIEW  Comparison: 08/18  Findings: There is persistent small bowel dilatation, similar to the previous exam, consistent with small bowel obstruction.  Small amount of gas present in the colon.  Nasogastric tube has its tip in the stomach.  IMPRESSION: Persistent small bowel obstruction pattern.   Original Report Authenticated By: Thomasenia Sales, M.D. ( 08/09/2012 07:48:04 )    Dg Abd Portable 2v  08/08/2012  *RADIOLOGY REPORT*  Clinical Data: Evaluate  small bowel obstruction.  Question worsening.  PORTABLE ABDOMEN - 2 VIEW  Comparison: 08/07/2012  Findings: Nasogastric tube tip overlies the level of the stomach. There is persistent significant dilatation of small bowel loops. No free intraperitoneal air on left lateral decubitus view.  There is persistent contrast, likely within the colon, showing little interval progression since previous exam.  IMPRESSION: Persistent high-grade small bowel obstruction.  Original Report Authenticated By: Patterson Hammersmith, M.D.    Anti-infectives: Anti-infectives     Start     Dose/Rate Route Frequency Ordered Stop   08/07/12 0930   cefTRIAXone (ROCEPHIN) 1 g in dextrose 5 % 50 mL IVPB        1 g 100 mL/hr over 30 Minutes Intravenous Every 24 hours 08/07/12 0845            Assessment/Plan: s/p * No surgery found * PSBO vs Ileus - continue NGT today, slow improvement clinically, will recheck films this am ARF - per primary team, some improvement  LOS: 4 days    [name] 08/10/2012

## 2012-08-10 NOTE — Progress Notes (Signed)
INITIAL ADULT NUTRITION ASSESSMENT Date: 08/10/2012   Time: 11:29 AM  Reason for Assessment: Low Braden  INTERVENTION:  Recommend advance diet as tolerated to regular.  If unable to tolerate diet advancement within the next 3 days, consider TPN.   DOCUMENTATION CODES Per approved criteria  -Obesity Unspecified    ASSESSMENT: Female 77 y.o.  Dx: ARF (acute renal failure); Hypotension, ABD Pain, and UTI  Hx:  Past Medical History  Diagnosis Date  . Hypertension   . Hyperlipidemia   . COPD (chronic obstructive pulmonary disease)   . Arthritis   . Depression with anxiety   . GERD (gastroesophageal reflux disease)   . Hiatal hernia   . Atrial fibrillation   . Productive cough   . Leg pain   . AAA (abdominal aortic aneurysm)   . CAD (coronary artery disease)   . Chronic kidney disease    Past Surgical History  Procedure Date  . Kidney stone surgery   . Cystectomy     cyst removal on pituitary gland    Related Meds:  Scheduled Meds:   . cefTRIAXone (ROCEPHIN)  IV  1 g Intravenous Q24H  . pantoprazole (PROTONIX) IV  40 mg Intravenous Q1200  . potassium chloride  10 mEq Intravenous Q1 Hr x 3  . sodium chloride  500 mL Intravenous Once  . sodium chloride      . tiotropium  18 mcg Inhalation Daily  . DISCONTD: diclofenac sodium  1 application Topical QID   Continuous Infusions:   . sodium chloride 125 mL/hr at 08/10/12 1000   PRN Meds:.acetaminophen, HYDROmorphone (DILAUDID) injection, metoCLOPramide (REGLAN) injection, ondansetron (ZOFRAN) IV, DISCONTD: acetaminophen, DISCONTD: albuterol, DISCONTD: alum & mag hydroxide-simeth, DISCONTD: ondansetron, DISCONTD: oxyCODONE, DISCONTD: zolpidem   Ht: 5\' 4"  (162.6 cm)  Wt: 187 lb 6.3 oz (85 kg)  Ideal Wt: 54.5 kg % Ideal Wt: 156%  Wt Readings from Last 10 Encounters:  08/06/12 187 lb 6.3 oz (85 kg)  08/04/12 183 lb 9.6 oz (83.28 kg)  05/24/12 199 lb 4.7 oz (90.4 kg)  12/10/11 192 lb (87.091 kg)  11/26/11 195  lb (88.451 kg)  06/06/10 152 lb (68.947 kg)    Usual Wt: 199 lb % Usual Wt: 94%  Body mass index is 32.17 kg/(m^2). Class 1 obesity  Food/Nutrition Related Hx: 6% weight loss in 2.5 months; has had complaints of abdominal pain, nausea and dry heaves X 3 days PTA.   Labs:  CMP     Component Value Date/Time   NA 144 08/10/2012 0345   K 3.1* 08/10/2012 0345   CL 101 08/10/2012 0345   CO2 16* 08/10/2012 0345   GLUCOSE 86 08/10/2012 0345   BUN 114* 08/10/2012 0345   CREATININE 3.43* 08/10/2012 0345   CALCIUM 8.7 08/10/2012 0345   PROT 6.4 08/10/2012 0345   ALBUMIN 1.9* 08/10/2012 0345   AST 186* 08/10/2012 0345   ALT 84* 08/10/2012 0345   ALKPHOS 141* 08/10/2012 0345   BILITOT 0.5 08/10/2012 0345   GFRNONAA 12* 08/10/2012 0345   GFRAA 14* 08/10/2012 0345    Potassium  Date/Time Value Range Status  08/10/2012  3:45 AM 3.1* 3.5 - 5.1 mEq/L Final  08/09/2012  5:30 AM 3.1* 3.5 - 5.1 mEq/L Final  08/08/2012  8:35 AM 3.2* 3.5 - 5.1 mEq/L Final     Magnesium  Date/Time Value Range Status  08/10/2012  3:45 AM 2.4  1.5 - 2.5 mg/dL Final      Intake/Output Summary (Last 24 hours) at 08/10/12 1252  Last data filed at 08/10/12 1149  Gross per 24 hour  Intake   2975 ml  Output   2450 ml  Net    525 ml    Diet Order: NPO except for ice chips   IVF:    sodium chloride Last Rate: 125 mL/hr at 08/10/12 1000    Estimated Nutritional Needs:   Kcal: 1500-1700 Protein: 80-90 grams Fluid: 1.5-1.7 liters  Patient is at nutrition risk given recent weight loss of 6% in 2.5 months, low Braden score, and prolonged NPO status.  X-ray shows some dilated SB loops.  Patient c/o nausea.  Had 2 liquid BM's this morning.  NG to remain in place for today; 150 ml output documented at 8 AM today.   NUTRITION DIAGNOSIS: -Inadequate oral intake (NI-2.1).  Status: Ongoing  RELATED TO: altered GI function  AS EVIDENCED BY: NPO status  MONITORING/EVALUATION(Goals): Goal:  Tolerate diet advancement with  good PO intake. Monitor:  Diet advancement, PO intake, labs, weight trend.  EDUCATION NEEDS: -Education not appropriate at this time   Joaquin Courts, RD, CNSC, [place] Pager# [phone] After Hours Pager# [phone]  08/10/2012, 11:29 AM

## 2012-08-10 NOTE — Progress Notes (Signed)
  Echocardiogram 2D Echocardiogram has been performed.  [name] [name] 08/10/2012, 12:00 PM

## 2012-08-11 ENCOUNTER — Inpatient Hospital Stay (HOSPITAL_COMMUNITY): Payer: Medicare Other

## 2012-08-11 LAB — BASIC METABOLIC PANEL
BUN: 94 mg/dL — ABNORMAL HIGH (ref 6–23)
BUN: 84 mg/dL — ABNORMAL HIGH (ref 6–23)
Calcium: 9.4 mg/dL (ref 8.4–10.5)
Calcium: 9.6 mg/dL (ref 8.4–10.5)
Creatinine, Ser: 2.23 mg/dL — ABNORMAL HIGH (ref 0.50–1.10)
GFR calc Af Amer: 24 mL/min — ABNORMAL LOW (ref >90)
GFR calc non Af Amer: 21 mL/min — ABNORMAL LOW (ref >90)
GFR calc non Af Amer: 25 mL/min — ABNORMAL LOW (ref >90)
Glucose, Bld: 104 mg/dL — ABNORMAL HIGH (ref 70–99)
Potassium: 3.6 mEq/L (ref 3.5–5.1)

## 2012-08-11 LAB — APTT: aPTT: 36 seconds (ref 24–37)

## 2012-08-11 LAB — CBC
Hemoglobin: 11.5 g/dL — ABNORMAL LOW (ref 12.0–15.0)
MCH: 27.8 pg (ref 26.0–34.0)
RBC: 4.14 MIL/uL (ref 3.87–5.11)
WBC: 8.4 10*3/uL (ref 4.0–10.5)

## 2012-08-11 MED ORDER — POTASSIUM CHLORIDE 10 MEQ/100ML IV SOLN
10.0000 meq | INTRAVENOUS | Status: AC
  Administered 2012-08-11 (×5): 10 meq via INTRAVENOUS
  Filled 2012-08-11: qty 500

## 2012-08-11 MED ORDER — SODIUM CHLORIDE 0.9 % IV BOLUS (SEPSIS)
500.0000 mL | Freq: Once | INTRAVENOUS | Status: AC
  Administered 2012-08-11: 500 mL via INTRAVENOUS

## 2012-08-11 MED ORDER — LORAZEPAM 2 MG/ML IJ SOLN
0.5000 mg | Freq: Four times a day (QID) | INTRAMUSCULAR | Status: DC
  Administered 2012-08-11 – 2012-08-12 (×4): 0.5 mg via INTRAVENOUS
  Filled 2012-08-11 (×5): qty 1

## 2012-08-11 MED ORDER — LORAZEPAM 2 MG/ML IJ SOLN
0.5000 mg | INTRAMUSCULAR | Status: DC | PRN
  Administered 2012-08-11 – 2012-08-13 (×2): 0.5 mg via INTRAVENOUS
  Filled 2012-08-11 (×3): qty 1

## 2012-08-11 MED ORDER — QUETIAPINE 12.5 MG HALF TABLET
12.5000 mg | ORAL_TABLET | Freq: Every day | ORAL | Status: DC
  Administered 2012-08-11: 12.5 mg via ORAL
  Filled 2012-08-11 (×2): qty 1

## 2012-08-11 MED ORDER — SODIUM CHLORIDE 0.9 % IJ SOLN
INTRAMUSCULAR | Status: AC
  Filled 2012-08-11: qty 10

## 2012-08-11 MED ORDER — HEPARIN (PORCINE) IN NACL 100-0.45 UNIT/ML-% IJ SOLN
2200.0000 [IU]/h | INTRAMUSCULAR | Status: DC
  Administered 2012-08-11 – 2012-08-13 (×3): 1150 [IU]/h via INTRAVENOUS
  Administered 2012-08-14: 1550 [IU]/h via INTRAVENOUS
  Administered 2012-08-14: 1150 [IU]/h via INTRAVENOUS
  Administered 2012-08-15: 1700 [IU]/h via INTRAVENOUS
  Administered 2012-08-15: 1650 [IU]/h via INTRAVENOUS
  Administered 2012-08-15: 1700 [IU]/h via INTRAVENOUS
  Administered 2012-08-16 – 2012-08-17 (×2): 1950 [IU]/h via INTRAVENOUS
  Filled 2012-08-11 (×14): qty 250

## 2012-08-11 MED ORDER — DEXTROSE 5 % IV SOLN
1.0000 g | INTRAVENOUS | Status: DC
  Administered 2012-08-11 – 2012-08-14 (×4): 1 g via INTRAVENOUS
  Filled 2012-08-11 (×4): qty 10

## 2012-08-11 MED ORDER — SODIUM CHLORIDE 0.9 % IJ SOLN
INTRAMUSCULAR | Status: AC
  Filled 2012-08-11: qty 20

## 2012-08-11 MED ORDER — HALOPERIDOL LACTATE 5 MG/ML IJ SOLN: 1.0000 mg | Freq: Four times a day (QID) | INTRAMUSCULAR | Status: DC | PRN

## 2012-08-11 MED ORDER — HEPARIN BOLUS VIA INFUSION
4000.0000 [IU] | Freq: Once | INTRAVENOUS | Status: DC
  Administered 2012-08-11: 4000 [IU] via INTRAVENOUS
  Filled 2012-08-11: qty 4000

## 2012-08-11 NOTE — Progress Notes (Signed)
Attempted to get patient OOB to chair.  Unable to get up as a two person assist; Sera-Plus tried and still unsuccessful and patient back in bed. PT/OT order.  Will continue to monitor.

## 2012-08-11 NOTE — Progress Notes (Signed)
Patient back from radiology and has pulled NG tube out.  Marlyne Beards, Kimiyah for [facility] notified and new orders received to leave NG out and monitor patient closely for nausea/vomitting and abdominal distention (primary MD Dr. Renne Musca also notified).  Will continue to monitor.

## 2012-08-11 NOTE — Progress Notes (Signed)
Pt sent to Radiology and lost NG tube.  Nurse reports she's doing well without it, no nausea.  We will leave it out for now unless she has more nausea or distension.  If she does they will replace and place back on suction.  Dr. Sharon Seller is concerned with 2D Echo and possible outflow tract thrombus. D-dimer 9.62. They are in the process of ruling out PE.

## 2012-08-11 NOTE — Progress Notes (Signed)
D: Pt. In SVT Hr 120's-130"s BP 99/62 A: MD paged and made aware. R:MD will be in to see Pt. Pt resting comfortably at this time.

## 2012-08-11 NOTE — Progress Notes (Signed)
Patient ID: [name] [name], female   DOB: Jul 23, 1935, 77 y.o.   MRN: [id number]    Subjective: Several BMs yesterday and +flatus, Reports abd feels better today.    Objective: Vital signs in last 24 hours: Temp:  [97.4 F (36.3 C)-98.7 F (37.1 C)] 98.4 F (36.9 C) (08/21 0328) Pulse Rate:  [66-113] 75  (08/21 0700) Resp:  [11-29] 18  (08/21 0700) BP: (87-135)/(62-84) 110/71 mmHg (08/21 0700) SpO2:  [91 %-99 %] 91 % (08/21 0700) Last BM Date: 08/09/12  Intake/Output from previous day: Aug 26, 2023 0701 - 08/21 0700 In: 1417.5 [I.V.:1187.5; NG/GT:180; IV Piggyback:50] Out: 2725 [Urine:2225; Emesis/NG output:500] Intake/Output this shift:   Physical Exam: General; awake, alert, no acute distress Resp: clear to auscultation bilaterally Cardio: regular rate and rhythm GI: distended but soft, mild tenderness, faint bs  Lab Results:   Basename 08/09/12 0530 08/08/12 0835  WBC 7.0 6.1  HGB 11.6* 12.2  HCT 34.3* 35.3*  PLT 220 206   BMET  Basename 08/11/12 0505 08-25-12 0345  NA 148* 144  K 2.8* 3.1*  CL 110 101  CO2 18* 16*  GLUCOSE 100* 86  BUN 94* 114*  CREATININE 2.23* 3.43*  CALCIUM 9.4 8.7   PT/INR No results found for this basename: LABPROT:2,INR:2 in the last 72 hours ABG No results found for this basename: PHART:2,PCO2:2,PO2:2,HCO3:2 in the last 72 hours  Studies/Results: Dg Abd 2 Views  08/25/2012  *RADIOLOGY REPORT*  Clinical Data: Abdominal distention, nausea.  ABDOMEN - 2 VIEW  Comparison:   the previous day's study  Findings: No free air on the left lateral decubitus radiograph. Multiple dilated small bowel loops throughout the abdomen with fluid levels on the decubitus film.  Residual contrast in the decompressed colon. Nasogastric tube extends into the nondilated stomach.  IMPRESSION:  1.  Persistent   small bowel obstruction.  No free air.   Original Report Authenticated By: Osa Craver, M.D.     Anti-infectives: Anti-infectives     Start      Dose/Rate Route Frequency Ordered Stop   08/07/12 0930   cefTRIAXone (ROCEPHIN) 1 g in dextrose 5 % 50 mL IVPB  Status:  Discontinued        1 g 100 mL/hr over 30 Minutes Intravenous Every 24 hours 08/07/12 0845 2012-08-25 2310          Assessment/Plan: s/p * No surgery found * PSBO vs Ileus - clamp NGT today and give ice chips, if tolerates this then may be able to d/c NGT later today, slow improvement clinically, will recheck films this am ARF - per primary team, some improvement  LOS: 5 days    Isreal Moline 08/11/2012

## 2012-08-11 NOTE — Progress Notes (Signed)
[facility] Progress Note [place] TEAM 1 - Stepdown/ICU TEAM   [name] M Randal QIO:[id number] DOB: 04/16/1935 DOA: 08/06/2012 PCP: Fredirick Maudlin, MD  Brief narrative: 77 y.o. Female who was transferred from [facility] in [place] to [place] due to Hypotension, ABD Pain, and UTI along with a history of an AAA which is being followed by Dr, Waverly Ferrari. She lives at an Assisted Living facility [facility]) and has had complaints of Abd Pain, nausea and dry heaves X 3 days. She denied constipation, or diarrhea. She also denied having fevers or chills. She had worsening weakness.   Assessment/Plan:  ARF on CKD 4- non-oliguric  - Baseline Cr about 1.7-1.9 (GFR < 30)  - CT abd/pelvis on 8/14 done with contrast - cause of her renal failure is likely ATN superimposed on dehydration - she is on Demedex at home  - FeNa 8.93% - Consistent with ATN  -Hold nephrotoxic drugs   Hypotension - persistent Due to dehydraton vs accumulation of anti-hypertenisves in setting of ARF - Cont to hydrate - echocardiogram has raised a question of a right ventricular outflow tract obstruction - see discussion below  Confusion / benzodiazepine dependence The patient's family has reported to the nursing staff the patient religiously takes her benzodiazepines at home and has been doing so for years - given a return in her renal function I will initiate scheduled IV Ativan as I suspect that her current confusion is due to a combination of uremia plus a possible low-grade benzodiazepine withdrawal  Possible right ventricular outflow tract obstruction / thrombus Echo has raised a question of a possible RVOT thrombus - a d-dimer has been found to be markedly elevated - clearly the patient cannot receive IV contrast in the setting - given the high pretest probability for thrombosis I will empirically treat the patient with full dose heparin and obtain a VQ scan to rule out large pulmonary embolism     PSBO vs/ Illeus  General Surgery is following   UTI / pyelonephritis Rocephin - cultures were not ordered in ER - keeping in mind pt had already been given antibiotics it is not surprising that her culture was without significant findings - in this clinical setting and given the extent to which her UA was abnormal I feel strongly that a seven-day course of empiric antibiotics is appropriate  Hypokalemia - severe Replace and continue to follow - goal is 4.0 in this clinical setting - magnesium is normal  AAA (abdominal aortic aneurysm)  Being followed as oupt - CT performed on 8/14 - results reveal a stable 5.4 cm infrarenal aneurysm w/o leak or rupture   COPD  Stable currently   Code Status: Full Disposition Plan: Remain in step down unit  Consultants: Vascular surgery - signed off General surgery  Procedures: None  Antibiotics: Rocephin 08/06/2012>>  DVT prophylaxis: Full dose heparin to begin today  HPI/Subjective: The patient is alert today but confused.  She cannot provide a reliable history.  She simply continues to ask for "a sip of orange juice".   Objective: Blood pressure 110/71, pulse 78, temperature 98.1 F (36.7 C), temperature source Oral, resp. rate 18, height 5\' 4"  (1.626 m), weight 85 kg (187 lb 6.3 oz), SpO2 93.00%.  Intake/Output Summary (Last 24 hours) at 08/11/12 1116 Last data filed at 08/11/12 1000  Gross per 24 hour  Intake   1805 ml  Output   3250 ml  Net  -1445 ml     Exam: General: No  acute respiratory distress at rest Lungs: Clear to auscultation bilaterally without wheezes or crackles Cardiovascular: Tachycardic but regular without gallop rub or appreciable murmur Abdomen: Diffusely distended but not tense, bowel sounds hypoactive, no focal mass, no rebound, no appreciable ascites Extremities: No significant cyanosis, clubbing, or edema bilateral lower extremities  Data Reviewed: Basic Metabolic Panel:  Lab 08/11/12 1478 08/10/12  0345 08/09/12 0530 08/08/12 0835 08/07/12 0259  NA 148* 144 139 137 135  K 2.8* 3.1* 3.1* 3.2* 3.0*  CL 110 101 93* 90* 89*  CO2 18* 16* 16* 19 19  GLUCOSE 100* 86 68* 68* 86  BUN 94* 114* 124* 125* 123*  CREATININE 2.23* 3.43* 4.51* 5.12* 5.43*  CALCIUM 9.4 8.7 8.4 8.1* 6.9*  MG -- 2.4 -- -- --  PHOS -- -- -- -- --   Liver Function Tests:  Lab 08/10/12 0345 08/06/12 2143  AST 186* 266*  ALT 84* 76*  ALKPHOS 141* 98  BILITOT 0.5 1.1  PROT 6.4 6.6  ALBUMIN 1.9* 1.9*   CBC:  Lab 08/09/12 0530 08/08/12 0835 08/07/12 0259 08/06/12 2143  WBC 7.0 6.1 5.3 5.7  NEUTROABS -- -- -- 4.2  HGB 11.6* 12.2 11.5* 11.6*  HCT 34.3* 35.3* 32.9* 33.9*  MCV 82.7 82.3 81.6 81.9  PLT 220 206 177 199   BNP (last 3 results)  Basename 05/23/12 1326  PROBNP 484.0*    Recent Results (from the past 240 hour(s))  MRSA PCR SCREENING     Status: Normal   Collection Time   08/06/12 11:43 PM      Component Value Range Status Comment   MRSA by PCR NEGATIVE  NEGATIVE Final   URINE CULTURE     Status: Normal   Collection Time   08/07/12  5:13 PM      Component Value Range Status Comment   Specimen Description URINE, CLEAN CATCH   Final    Special Requests NONE   Final    Culture  Setup Time 08/08/2012 03:48   Final    Colony Count 10,000 COLONIES/ML   Final    Culture     Final    Value: Multiple bacterial morphotypes present, none predominant. Suggest appropriate recollection if clinically indicated.   Report Status 08/09/2012 FINAL   Final      Studies:  Recent x-ray studies have been reviewed in detail by the Attending Physician  Scheduled Meds:  Reviewed in detail by the Attending Physician   Lonia Blood, MD [facility] Office  [phone] Pager [phone]  On-Call/Text Page:      [email or web address]      password [email or web address]  If 7PM-7AM, please contact night-coverage [email or web address] Password [email or web address] 08/11/2012, 11:16 AM   LOS: 5 days

## 2012-08-11 NOTE — Progress Notes (Signed)
The nurse notified me that patient was tachycardic in the 130s and on exam at the bedside patient also was found to be hypotensive with a blood pressure in the 80s systolic. Her heart rate has already come down but blood pressure still low so I have ordered 500 cc normal saline bolus. We will continue to monitor closely. Patient is also receiving IV potassium for hypokalemia.  Midge Minium

## 2012-08-11 NOTE — Progress Notes (Signed)
Clinical Social Work  CSW continues to follow patient. Per chart review, PT/OT was ordered. CSW will follow up after evaluation to determine level of care needed for patient. Patient is from ALF where she used a wheelchair to ambulate. Patient was able to get herself from bed or chair into wheelchair. CSW will continue to follow.  [place], [place] [phone]

## 2012-08-11 NOTE — Consult Note (Signed)
ANTICOAGULATION CONSULT NOTE - Follow Up Consult  Pharmacy Consult for Heparin Indication:  Possible large R-ventricular outflow obstruction/thrombus [pulmonary embolus]  Allergies Allergen  . Asa (Aspirin)  . Lidocaine  . Nsaids  . Sulfonamide Derivatives    Patient Measurements: Height: 5\' 4"  (162.6 cm) Weight: 187 lb 6.3 oz (85 kg) IBW/kg (Calculated) : 54.7  Heparin dosing weight 74.3 kg  Vital Signs: BP 112/74  Pulse 74  Temp 98.6 F (37 C) (Oral)  Resp 20  Ht 5\' 4"  (1.626 m)  Wt 187 lb 6.3 oz (85 kg)  BMI 32.17 kg/m2  SpO2 92%  Labs:  Basename 08/11/12 0505 08/10/12 0345 08/09/12 0530  HGB -- -- 11.6*  HCT -- -- 34.3*  PLT -- -- 220  APTT Pending -- --  LABPROT Pending -- --  INR Pending -- --  CREATININE 2.23* 3.43* 4.51*   Estimated Creatinine Clearance: 27.8 ml/min (by C-G formula based on Cr of 1.87).  Lab Results   Most recent  Component Value Date   INR 1.03 05/23/2012    Medical History: Past Medical History  Diagnosis Date  . Hypertension   . Hyperlipidemia   . COPD (chronic obstructive pulmonary disease)   . Depression with anxiety   . GERD (gastroesophageal reflux disease)   . Hiatal hernia   . Atrial fibrillation   . Productive cough   . Leg pain   . AAA (abdominal aortic aneurysm)   . CAD (coronary artery disease)   . Chronic kidney disease    Past Surgical History  Procedure Date  . Kidney stone surgery   . Cystectomy     Medications:  Prescriptions prior to admission  Medication Sig Dispense Refill  . acetaminophen (TYLENOL) 500 MG tablet Take 500 mg by mouth every 4 (four) hours as needed. For pain      . albuterol (PROVENTIL HFA;VENTOLIN HFA) 108 (90 BASE) MCG/ACT inhaler Inhale 2 puffs into the lungs every 4 (four) hours as needed. For wheezing      . ALPRAZolam (XANAX) 1 MG tablet Take 1 mg by mouth 3 (three) times daily.      . Alum & Mag Hydroxide-Simeth (ANTACID LIQUID PO) Take 30 mLs by mouth every 2 (two) hours as  needed. Heartburn      . amLODipine (NORVASC) 2.5 MG tablet Take 2.5 mg by mouth daily.      . Choline Fenofibrate (TRILIPIX) 135 MG capsule Take 135 mg by mouth daily.      . diclofenac sodium (VOLTAREN) 1 % GEL Apply 1 application topically 4 (four) times daily. To hip      . HYDROcodone-acetaminophen (NORCO) 10-325 MG per tablet Take 1 tablet by mouth 3 (three) times daily.      Marland Kitchen HYDROcodone-homatropine (HYCODAN) 5-1.5 MG/5ML syrup Take 5 mLs by mouth every 4 (four) hours as needed. For cough      . nebivolol (BYSTOLIC) 5 MG tablet Take 5 mg by mouth daily.      Marland Kitchen omega-3 acid ethyl esters (LOVAZA) 1 G capsule Take 1 g by mouth daily.       Marland Kitchen omeprazole (PRILOSEC) 20 MG capsule Take 20 mg by mouth daily.        . rosuvastatin (CRESTOR) 40 MG tablet Take 40 mg by mouth daily.        . simethicone (MYLICON) 80 MG chewable tablet Chew 80 mg by mouth 3 (three) times daily.       Marland Kitchen tiotropium (SPIRIVA HANDIHALER) 18 MCG inhalation capsule  Place 18 mcg into inhaler and inhale daily.        Marland Kitchen torsemide (DEMADEX) 20 MG tablet Take 20 mg by mouth 2 (two) times daily.      Marland Kitchen torsemide (DEMADEX) 20 MG tablet Take 20 mg by mouth daily as needed. For legs       Scheduled:    . cefTRIAXone (ROCEPHIN)  IV  1 g Intravenous Q24H  . LORazepam  0.5 mg Intravenous Q6H  . pantoprazole (PROTONIX) IV  40 mg Intravenous Q1200  . potassium chloride  10 mEq Intravenous Q1 Hr x 4  . potassium chloride  10 mEq Intravenous Q1 Hr x 5  . sodium chloride  500 mL Intravenous Once  . tiotropium  18 mcg Inhalation Daily    Anti-infectives     Start     Dose/Rate Route Frequency Ordered Stop   08/11/12 1200   cefTRIAXone (ROCEPHIN) 1 g in dextrose 5 % 50 mL IVPB        1 g 100 mL/hr over 30 Minutes Intravenous Every 24 hours 08/11/12 1137            Assessment:  77 y.o. Female who was transferred from [facility] in [place] to [facility] with Hypotension, ABD Pain, UTI, and with a history of an AAA.    CT reports a possible large R-ventricular outflow obstruction/thrombus.  Empiric full dose Heparin ordered to cover for thrombosis.  Heparin level (0.47) is at-goal on 1150 units/hr.   Goal of Therapy:   Heparin level 0.3-0.7 units/ml   Plan:  1. Continue IV heparin at 1150 units/hr.  2. Heparin level, CBC in AM to confirm dosing.  3. Will follow-up V/Q scan.   Thad Ranger, Mellody Drown, Pharm.D. 08/11/2012,  10:57 PM

## 2012-08-11 NOTE — Consult Note (Signed)
ANTICOAGULATION CONSULT NOTE - Initial Consult  Pharmacy Consult for Heparin Indication:  Possible large R-ventricular outflow obstruction/thrombus [pulmonary embolus]  Allergies Allergen  . Asa (Aspirin)  . Lidocaine  . Nsaids  . Sulfonamide Derivatives    Patient Measurements: Height: 5\' 4"  (162.6 cm) Weight: 187 lb 6.3 oz (85 kg) IBW/kg (Calculated) : 54.7  Heparin dosing weight 74.3 kg  Vital Signs: BP 110/71  Pulse 78  Temp 98.1 F (36.7 C) (Oral)  Resp 18  Ht 5\' 4"  (1.626 m)  Wt 187 lb 6.3 oz (85 kg)  BMI 32.17 kg/m2  SpO2 93%  Labs:  Basename 08/11/12 0505 08/10/12 0345 08/09/12 0530  HGB -- -- 11.6*  HCT -- -- 34.3*  PLT -- -- 220  APTT Pending -- --  LABPROT Pending -- --  INR Pending -- --  CREATININE 2.23* 3.43* 4.51*   Estimated Creatinine Clearance: 23.3 ml/min (by C-G formula based on Cr of 2.23).  Lab Results   Most recent  Component Value Date   INR 1.03 05/23/2012    Medical History: Past Medical History  Diagnosis Date  . Hypertension   . Hyperlipidemia   . COPD (chronic obstructive pulmonary disease)   . Depression with anxiety   . GERD (gastroesophageal reflux disease)   . Hiatal hernia   . Atrial fibrillation   . Productive cough   . Leg pain   . AAA (abdominal aortic aneurysm)   . CAD (coronary artery disease)   . Chronic kidney disease    Past Surgical History  Procedure Date  . Kidney stone surgery   . Cystectomy     Medications:  Prescriptions prior to admission  Medication Sig Dispense Refill  . acetaminophen (TYLENOL) 500 MG tablet Take 500 mg by mouth every 4 (four) hours as needed. For pain      . albuterol (PROVENTIL HFA;VENTOLIN HFA) 108 (90 BASE) MCG/ACT inhaler Inhale 2 puffs into the lungs every 4 (four) hours as needed. For wheezing      . ALPRAZolam (XANAX) 1 MG tablet Take 1 mg by mouth 3 (three) times daily.      . Alum & Mag Hydroxide-Simeth (ANTACID LIQUID PO) Take 30 mLs by mouth every 2 (two) hours as  needed. Heartburn      . amLODipine (NORVASC) 2.5 MG tablet Take 2.5 mg by mouth daily.      . Choline Fenofibrate (TRILIPIX) 135 MG capsule Take 135 mg by mouth daily.      . diclofenac sodium (VOLTAREN) 1 % GEL Apply 1 application topically 4 (four) times daily. To hip      . HYDROcodone-acetaminophen (NORCO) 10-325 MG per tablet Take 1 tablet by mouth 3 (three) times daily.      Marland Kitchen HYDROcodone-homatropine (HYCODAN) 5-1.5 MG/5ML syrup Take 5 mLs by mouth every 4 (four) hours as needed. For cough      . nebivolol (BYSTOLIC) 5 MG tablet Take 5 mg by mouth daily.      Marland Kitchen omega-3 acid ethyl esters (LOVAZA) 1 G capsule Take 1 g by mouth daily.       Marland Kitchen omeprazole (PRILOSEC) 20 MG capsule Take 20 mg by mouth daily.        . rosuvastatin (CRESTOR) 40 MG tablet Take 40 mg by mouth daily.        . simethicone (MYLICON) 80 MG chewable tablet Chew 80 mg by mouth 3 (three) times daily.       Marland Kitchen tiotropium (SPIRIVA HANDIHALER) 18 MCG inhalation capsule Place  18 mcg into inhaler and inhale daily.        Marland Kitchen torsemide (DEMADEX) 20 MG tablet Take 20 mg by mouth 2 (two) times daily.      Marland Kitchen torsemide (DEMADEX) 20 MG tablet Take 20 mg by mouth daily as needed. For legs       Scheduled:    . cefTRIAXone (ROCEPHIN)  IV  1 g Intravenous Q24H  . LORazepam  0.5 mg Intravenous Q6H  . pantoprazole (PROTONIX) IV  40 mg Intravenous Q1200  . potassium chloride  10 mEq Intravenous Q1 Hr x 4  . potassium chloride  10 mEq Intravenous Q1 Hr x 5  . sodium chloride  500 mL Intravenous Once  . tiotropium  18 mcg Inhalation Daily    Anti-infectives     Start     Dose/Rate Route Frequency Ordered Stop   08/11/12 1200   cefTRIAXone (ROCEPHIN) 1 g in dextrose 5 % 50 mL IVPB        1 g 100 mL/hr over 30 Minutes Intravenous Every 24 hours 08/11/12 1137            Assessment:  77 y.o. Female who was transferred from [facility] in [place] to [facility] with Hypotension, ABD Pain, UTI, and with a history of an AAA.    CT reports a possible large R-ventricular outflow obstruction/thrombus.  Empiric full dose Heparin ordered to cover for thrombosis.  Most recent coags reported within normal limits.  Current PT, PTT pending.  Goal of Therapy:   Heparin level 0.3-0.7 units/ml   Plan:   Heparin bolus 4000 units x 1, then begin Heparin infusion at 1150 units/hr. 8 hour Heparin Level, CBC Due 2200 pm. Daily Heparin level and CBC while on Heparin.  Will follow-up V/Q scan.   Keviana Guida, Elisha Headland, Pharm.D. 08/11/2012,  1:14 PM

## 2012-08-11 NOTE — Progress Notes (Signed)
Noted concern and work-up for PE.  Had another liquid BM but x-rays not better yet.  Abdomen softer with BS.  Will replace NGT if nauseated.  Medical team proceeding with PE W/U. Patient examined and I agree with the assessment and plan  Violeta Gelinas, MD, MPH, FACS Pager: [phone]  08/11/2012 2:32 PM

## 2012-08-11 NOTE — Progress Notes (Signed)
Upon coming to check on patient, patient has pulled out her foley catheter.  Patient assessed and no bleeding or tissue damage assessed.  MD notified and orders to leave foley out and to monitor patient for spontaneous void.  Patient now placed in safety mitts and Ativan frequency increased.  Will continue to monitor.

## 2012-08-12 DIAGNOSIS — E870 Hyperosmolality and hypernatremia: Secondary | ICD-10-CM

## 2012-08-12 LAB — CBC
HCT: 34 % — ABNORMAL LOW (ref 36.0–46.0)
Hemoglobin: 11.3 g/dL — ABNORMAL LOW (ref 12.0–15.0)
MCH: 27.8 pg (ref 26.0–34.0)
MCV: 83.5 fL (ref 78.0–100.0)
Platelets: 224 10*3/uL (ref 150–400)
RBC: 4.07 MIL/uL (ref 3.87–5.11)

## 2012-08-12 LAB — BASIC METABOLIC PANEL
CO2: 22 mEq/L (ref 19–32)
Calcium: 9.5 mg/dL (ref 8.4–10.5)
Chloride: 119 mEq/L — ABNORMAL HIGH (ref 96–112)
Glucose, Bld: 106 mg/dL — ABNORMAL HIGH (ref 70–99)
Sodium: 156 mEq/L — ABNORMAL HIGH (ref 135–145)

## 2012-08-12 MED ORDER — QUETIAPINE 12.5 MG HALF TABLET
12.5000 mg | ORAL_TABLET | Freq: Every day | ORAL | Status: DC
  Administered 2012-08-12: 12.5 mg via ORAL
  Filled 2012-08-12 (×2): qty 1

## 2012-08-12 MED ORDER — SODIUM CHLORIDE 0.9 % IJ SOLN
INTRAMUSCULAR | Status: AC
  Administered 2012-08-12: 10 mL
  Filled 2012-08-12: qty 20

## 2012-08-12 MED ORDER — POTASSIUM CHLORIDE 10 MEQ/100ML IV SOLN
10.0000 meq | INTRAVENOUS | Status: AC
  Administered 2012-08-12 (×4): 10 meq via INTRAVENOUS
  Filled 2012-08-12: qty 400

## 2012-08-12 MED ORDER — DEXTROSE 5 % IV SOLN
INTRAVENOUS | Status: DC
  Administered 2012-08-12 – 2012-08-14 (×5): via INTRAVENOUS

## 2012-08-12 MED ORDER — LORAZEPAM 2 MG/ML IJ SOLN
0.5000 mg | Freq: Two times a day (BID) | INTRAMUSCULAR | Status: DC
  Administered 2012-08-13 – 2012-08-14 (×2): 0.5 mg via INTRAVENOUS
  Filled 2012-08-12: qty 1

## 2012-08-12 NOTE — Progress Notes (Signed)
[facility] Progress Note [place] TEAM 1 - Stepdown/ICU TEAM   [name] M Tegeler OZH:[id number] DOB: 1935/09/30 DOA: 08/06/2012 PCP: Fredirick Maudlin, MD  Brief narrative: 77 y.o. Female who was transferred from [facility] in [place] to [place] due to Hypotension, ABD Pain, and UTI along with a history of an AAA which is being followed by Dr, Waverly Ferrari. She lives at an Assisted Living facility [facility]) and has had complaints of Abd Pain, nausea and dry heaves X 3 days. She denied constipation, or diarrhea. She also denied having fevers or chills. She had worsening weakness.   Assessment/Plan:  ARF on CKD 4- non-oliguric  - Baseline Cr about 1.7-1.9 (GFR < 30)  - CT abd/pelvis on 8/14 done with contrast - cause of her renal failure is likely ATN superimposed on dehydration - she is on Demedex at home  - FeNa 8.93% - Consistent with ATN  -Hold nephrotoxic drugs   Hypotension - improving Due to dehydraton vs accumulation of anti-hypertenisves in setting of ARF - Cont to hydrate - echocardiogram has raised a question of a right ventricular outflow tract obstruction - see discussion below  Confusion / benzodiazepine dependence The patient's family has reported to the nursing staff the patient religiously takes her benzodiazepines at home and has been doing so for years - given a return in her renal function, she had been placed on scheduled IV Ativan - due to lethargy, will cut back on dosing today. She is hypernatremic which could be adding to confusion.   Hypernatremia Switch NS to D5W-   Possible right ventricular outflow tract obstruction / thrombus Echo has raised a question of a possible RVOT thrombus - a d-dimer has been found to be markedly elevated - clearly the patient cannot receive IV contrast in the setting - given the high pretest probability for thrombosis we will empirically treat the patient with full dose heparin and obtain a VQ scan to rule out  large pulmonary embolism- pt not cooperative/ alert enough for V/Q today   PSBO vs/ Illeus  General Surgery is following - started on clears today  UTI / pyelonephritis Rocephin - cultures were not ordered in ER - keeping in mind pt had already been given antibiotics it is not surprising that her culture was without significant findings - in this clinical setting and given the extent to which her UA was abnormal I feel strongly that a seven-day course of empiric antibiotics is appropriate  Hypokalemia - severe Replace and continue to follow - goal is 4.0 in this clinical setting - magnesium is normal  AAA (abdominal aortic aneurysm)  Being followed as oupt - CT performed on 8/14 - results reveal a stable 5.4 cm infrarenal aneurysm w/o leak or rupture   COPD  Stable currently   Code Status: Full Disposition Plan: Remain in step down unit  Consultants: Vascular surgery - signed off General surgery  Procedures: None  Antibiotics: Rocephin 08/06/2012>>  DVT prophylaxis: Full dose heparin to begin today  HPI/Subjective: The patient is somnolent.  Non-verbal   Objective: Blood pressure 142/47, pulse 80, temperature 99.5 F (37.5 C), temperature source Oral, resp. rate 22, height 5\' 4"  (1.626 m), weight 85 kg (187 lb 6.3 oz), SpO2 99.00%.  Intake/Output Summary (Last 24 hours) at 08/12/12 1915 Last data filed at 08/12/12 1840  Gross per 24 hour  Intake   1588 ml  Output      0 ml  Net   1588 ml  Exam: General: No acute respiratory distress at rest Lungs: Clear to auscultation bilaterally without wheezes or crackles Cardiovascular: Tachycardic but regular without gallop rub or appreciable murmur Abdomen: Diffusely distended but not tense, bowel sounds hypoactive, no focal mass, no rebound, no appreciable ascites Extremities: No significant cyanosis, clubbing, or edema bilateral lower extremities  Data Reviewed: Basic Metabolic Panel:  Lab 08/12/12 0981 08/11/12  1922 08/11/12 0505 08/10/12 0345 08/09/12 0530  NA 156* 154* 148* 144 139  K 2.9* 3.6 2.8* 3.1* 3.1*  CL 119* 116* 110 101 93*  CO2 22 22 18* 16* 16*  GLUCOSE 106* 104* 100* 86 68*  BUN 78* 84* 94* 114* 124*  CREATININE 1.74* 1.87* 2.23* 3.43* 4.51*  CALCIUM 9.5 9.6 9.4 8.7 8.4  MG -- -- -- 2.4 --  PHOS -- -- -- -- --   Liver Function Tests:  Lab 08/10/12 0345 08/06/12 2143  AST 186* 266*  ALT 84* 76*  ALKPHOS 141* 98  BILITOT 0.5 1.1  PROT 6.4 6.6  ALBUMIN 1.9* 1.9*   CBC:  Lab 08/12/12 0415 08/11/12 2136 08/09/12 0530 08/08/12 0835 08/07/12 0259 08/06/12 2143  WBC 8.7 8.4 7.0 6.1 5.3 --  NEUTROABS -- -- -- -- -- 4.2  HGB 11.3* 11.5* 11.6* 12.2 11.5* --  HCT 34.0* 34.1* 34.3* 35.3* 32.9* --  MCV 83.5 82.4 82.7 82.3 81.6 --  PLT 224 233 220 206 177 --   BNP (last 3 results)  Basename 05/23/12 1326  PROBNP 484.0*    Recent Results (from the past 240 hour(s))  MRSA PCR SCREENING     Status: Normal   Collection Time   08/06/12 11:43 PM      Component Value Range Status Comment   MRSA by PCR NEGATIVE  NEGATIVE Final   URINE CULTURE     Status: Normal   Collection Time   08/07/12  5:13 PM      Component Value Range Status Comment   Specimen Description URINE, CLEAN CATCH   Final    Special Requests NONE   Final    Culture  Setup Time 08/08/2012 03:48   Final    Colony Count 10,000 COLONIES/ML   Final    Culture     Final    Value: Multiple bacterial morphotypes present, none predominant. Suggest appropriate recollection if clinically indicated.   Report Status 08/09/2012 FINAL   Final      Studies:  Recent x-ray studies have been reviewed in detail by the Attending Physician  Scheduled Meds:  Reviewed in detail by the Attending Physician   Calvert Cantor, MD [phone]  If 7PM-7AM, please contact night-coverage [email or web address] Password [facility] 08/12/2012, 7:15 PM   LOS: 6 days

## 2012-08-12 NOTE — Evaluation (Signed)
Clinical/Bedside Swallow Evaluation Patient Details  Name: [name] [name] MRN: [id number] Date of Birth: 1935/01/05  Today's Date: 08/12/2012 Time: 4098-1191 SLP Time Calculation (min): 28 min  Past Medical History:  Past Medical History  Diagnosis Date  . Hypertension   . Hyperlipidemia   . COPD (chronic obstructive pulmonary disease)   . Arthritis   . Depression with anxiety   . GERD (gastroesophageal reflux disease)   . Hiatal hernia   . Atrial fibrillation   . Productive cough   . Leg pain   . AAA (abdominal aortic aneurysm)   . CAD (coronary artery disease)   . Chronic kidney disease    Past Surgical History:  Past Surgical History  Procedure Date  . Kidney stone surgery   . Cystectomy     cyst removal on pituitary gland   HPI:  77 y.o. Female who was transferred from [facility] in [place] to [place] due to Hypotension, ABD Pain, and UTI along with a history of an AAA which is being followed by Dr, Waverly Ferrari. She lives at an Assisted Living facility [facility]) and has had complaints of Abd Pain, nausea and dry heaves X 3 days. She denied constipation, or diarrhea. She also denied having fevers or chills. She had worsening weakness.Patient has a history of Hiatal Hernia and GERD.  RN reports SBO is suspected.  Patient has also been confused, and currently appears to be having hallucinations, asking me to "Help that woman in the corner."     Assessment / Plan / Recommendation Clinical Impression  Patient poorly cooperative for this exam, however, not overt s/s of aspiration were observed with thin liquids.  Patient refused other consistencies, and RN indicated that it may be best to wait with patient's suspected SBO.    Aspiration Risk  Mild    Diet Recommendation Thin liquid (Advance per GI recommendation (may be ok for purees))   Liquid Administration via: Cup (Patient does not like straws.) Medication Administration: Crushed with  puree Supervision: Full supervision/cueing for compensatory strategies;Staff feed patient Compensations: Slow rate;Small sips/bites;Follow solids with liquid Postural Changes and/or Swallow Maneuvers: Seated upright 90 degrees;Upright 30-60 min after meal    Other  Recommendations Recommended Consults: Consider GI evaluation Oral Care Recommendations: Oral care QID;Staff/trained caregiver to provide oral care Other Recommendations: Clarify dietary restrictions   Follow Up Recommendations  Skilled Nursing facility    Frequency and Duration min 1 x/week  2 weeks   Pertinent Vitals/Pain n/a    SLP Swallow Goals Patient will consume recommended diet without observed clinical signs of aspiration with: Moderate assistance Patient will utilize recommended strategies during swallow to increase swallowing safety with: Moderate assistance;Supervision/safety   Swallow Study Prior Functional Status  Type of Home: Assisted living Available Help at Discharge: Personal care attendant Vocation: Retired    General HPI: 77 y.o. Female who was transferred from [facility] in [place] to [place] due to Hypotension, ABD Pain, and UTI along with a history of an AAA which is being followed by Dr, Waverly Ferrari. She lives at an Assisted Living facility [facility]) and has had complaints of Abd Pain, nausea and dry heaves X 3 days. She denied constipation, or diarrhea. She also denied having fevers or chills. She had worsening weakness.Patient has a history of Hiatal Hernia and GERD.  RN reports SBO is suspected.  Patient has also been confused, and currently appears to be having hallucinations, asking me to "Help that woman in the corner."  Type of Study: Bedside swallow evaluation Diet Prior to this Study: NPO Temperature Spikes Noted: No History of Recent Intubation: No Behavior/Cognition: Confused;Agitated;Impulsive;Uncooperative;Distractible;Doesn't follow directions;Decreased sustained  attention Oral Cavity - Dentition: Edentulous Self-Feeding Abilities: Needs assist;Needs set up Patient Positioning: Upright in bed Baseline Vocal Quality: Clear Volitional Cough: Cognitively unable to elicit Volitional Swallow: Unable to elicit    Oral/Motor/Sensory Function Overall Oral Motor/Sensory Function: Other (comment) (Unable to assess.  Not f/c)   Ice Chips Ice chips: Not tested   Thin Liquid Thin Liquid: Within functional limits Presentation: Cup    Nectar Thick Nectar Thick Liquid: Not tested   Honey Thick Honey Thick Liquid: Not tested   Puree Puree: Not tested   Solid   GO    Solid: Not tested       Maryjo Rochester T 08/12/2012,5:02 PM

## 2012-08-12 NOTE — Evaluation (Signed)
Physical Therapy Evaluation Patient Details Name: [name] [name] MRN: [id number] DOB: 20-Apr-1935 Today's Date: 08/12/2012 Time: 7829-5621 PT Time Calculation (min): 35 min  PT Assessment / Plan / Recommendation Clinical Impression  pt admitted with abdominal pain and worsening weakness.  On eval, pt was generally not appropriately responsive to commands and mobility was hindered by weakness, decr balance and inattension..  Expect pt will need SNF for rehab    PT Assessment  Patient needs continued PT services    Follow Up Recommendations  Skilled nursing facility (or HHPT at assisted Living if can give approp care.)    Barriers to Discharge        Equipment Recommendations  Defer to next venue    Recommendations for Other Services     Frequency Min 3X/week    Precautions / Restrictions Precautions Precautions: Fall Restrictions Weight Bearing Restrictions: No   Pertinent Vitals/Pain       Mobility  Bed Mobility Bed Mobility: Rolling Right;Rolling Left;Left Sidelying to Sit;Sitting - Scoot to Edge of Bed;Sit to Sidelying Left Rolling Right: 2: Max assist Rolling Left: 2: Max assist Left Sidelying to Sit: 1: +2 Total assist;HOB elevated;Other (comment) (25 degrees) Left Sidelying to Sit: Patient Percentage: 10% Sitting - Scoot to Edge of Bed: 1: +2 Total assist Sitting - Scoot to Edge of Bed: Patient Percentage: 0% Sit to Sidelying Left: 1: +2 Total assist;HOB flat Sit to Sidelying Left: Patient Percentage: 10% Details for Bed Mobility Assistance: vc/tc's to get pt to initiate normalized movement; truncal assist /support Transfers Transfers:  (unable to get pt to initiate a stand with +2 assist ) Details for Transfer Assistance: vc/tc's for set up of standing, but pt would not initiate stand and therefore task was aborted. Ambulation/Gait Ambulation/Gait Assistance: Not tested (comment) Stairs: No Wheelchair Mobility Wheelchair Mobility: No    Exercises     PT  Diagnosis: Generalized weakness;Altered mental status  PT Problem List: Decreased strength;Decreased activity tolerance;Decreased balance;Decreased mobility;Decreased coordination;Decreased cognition;Decreased safety awareness PT Treatment Interventions: Functional mobility training;Therapeutic activities;Balance training;Neuromuscular re-education;Patient/family education;Cognitive remediation;Gait training   PT Goals Acute Rehab PT Goals PT Goal Formulation: Patient unable to participate in goal setting Time For Goal Achievement: 08/26/12 Potential to Achieve Goals: Fair Pt will go Sit to Stand: with mod assist PT Goal: Sit to Stand - Progress: Goal set today Pt will Transfer Bed to Chair/Chair to Bed: with max assist PT Transfer Goal: Bed to Chair/Chair to Bed - Progress: Goal set today Additional Goals Additional Goal #1: pt will participate in pregait activity at EOB, progressing to gait with max A. PT Goal: Additional Goal #1 - Progress: Goal set today  Visit Information  Last PT Received On: 08/12/12 Assistance Needed: +2 PT/OT Co-Evaluation/Treatment: Yes    Subjective Data  Patient Stated Goal: pt unable to state   Prior Functioning  Home Living Available Help at Discharge: Personal care attendant Type of Home: Assisted living [facility]) Home Access: Level entry    Cognition  Overall Cognitive Status: Impaired Area of Impairment: Attention;Following commands;Safety/judgement;Awareness of deficits;Problem solving Arousal/Alertness: Lethargic Behavior During Session: Lethargic (but became agitated toward end of eval/treatment) Current Attention Level: Focused Following Commands: Other (comment) (does not follow commands) Safety/Judgement: Decreased awareness of safety precautions    Extremity/Trunk Assessment Right Lower Extremity Assessment RLE ROM/Strength/Tone: Unable to fully assess;Due to impaired cognition;Deficits RLE ROM/Strength/Tone Deficits: moved LE;s  while assisted or gravity eliminated, but difficult to assess Left Lower Extremity Assessment LLE ROM/Strength/Tone: Unable to fully assess;Due to impaired cognition;Deficits  LLE ROM/Strength/Tone Deficits: see R LE   Balance Balance Balance Assessed: Yes Static Sitting Balance Static Sitting - Balance Support: Right upper extremity supported;Left upper extremity supported;Feet supported Static Sitting - Level of Assistance: 2: Max assist;1: +1 Total assist Static Sitting - Comment/# of Minutes: 15 minutes total--pt tending to list left from minimal to heavily.  As pt's lethargy declined, her trunk activated a bit, but movements not coordinated.  End of Session PT - End of Session Activity Tolerance: Patient limited by fatigue;Other (comment) (progressively more agitated) Patient left: in bed;with call bell/phone within reach;with bed alarm set;with family/visitor present Nurse Communication: Need for lift equipment;Mobility status  GP     Mekenzie Modeste, Eliseo Gum 08/12/2012, 12:47 PM  08/12/2012  [place] Bing, PT [phone] (pager)

## 2012-08-12 NOTE — Progress Notes (Signed)
ANTICOAGULATION CONSULT NOTE - Follow Up Consult  Pharmacy Consult for Heparin Indication: Possible large R-ventricular outflow obstruction/thrombus [pulmonary embolus]  Patient Measurements: Heparin Dosing Weight: 74.3 kg  Vital Signs: BP 142/47  Pulse 83  Temp 97.2 F (36.2 C) (Oral)  Resp 23  Ht 5\' 4"  (1.626 m)  Wt 187 lb 6.3 oz (85 kg)  BMI 32.17 kg/m2  SpO2 100%  Labs:  Basename 08/12/12 0415 08/11/12 2136 08/11/12 1922 08/11/12 1244 08/11/12 0505  HGB 11.3* 11.5* -- -- --  HCT 34.0* 34.1* -- -- --  PLT 224 233 -- -- --  APTT -- -- -- 36 --  LABPROT -- -- -- 16.3* --  INR -- -- -- 1.29 --  HEPARINUNFRC 0.52 0.47 -- -- --  CREATININE 1.74* -- 1.87* -- 2.23*   Estimated Creatinine Clearance: 29.9 ml/min (by C-G formula based on Cr of 1.74).  Medications:  Current Infusions:   . heparin 1,150 Units/hr (08/12/12 1400)   Assessment:  77 y.o. Female with Hypotension, ABD Pain, UTI, and a history of an AAA . CT reports a possible large R-ventricular outflow obstruction/thrombus.  V/Q scan ordered.   Empiric full dose Heparin ordered to cover for thrombosis.  Heparin level = 0.52 units/ml on Heparin infusion rate of 1150 units/hr.  No bleeding noted.  Goal of Therapy:   Heparin level 0.3-0.7 units/ml   Plan:  Continue heparin infusion at 1150 units/hr  Continue to monitor H&H and platelets  Konnar Ben J  Pharm.D 08/12/2012, 4:05 PM

## 2012-08-12 NOTE — Progress Notes (Signed)
Clinical Social Work  CSW reviewed PT notes which stated that patient needs SNF or HH at ALF. Per ALF, they cannot provide enough assistance for patient and she will need SNF. CSW called patient's son to discuss dc plans since patient is confused. Son was upset when speaking with CSW and refused to discuss dc plans. Patient reported that the hospital was not properly caring for his mother and was upset that she was still confused. CSW explained that a plan would need to be made prior to dc and spoke about the importance of completing a bed search. Son hung up on CSW twice. CSW called patient's dtr and left a message. CSW will continue to follow.   [place], [place] [phone]

## 2012-08-12 NOTE — Progress Notes (Signed)
PSBO seems to be improving.  PE and MS change work-up per primary service.  Try sips. Patient examined and I agree with the assessment and plan  Violeta Gelinas, MD, MPH, FACS Pager: [phone]  08/12/2012 9:20 AM

## 2012-08-12 NOTE — Progress Notes (Signed)
Patient ID: [name] [name], female   DOB: [birth year], 77 y.o.   MRN: [id number] Patient ID: [name] [name], female   DOB: [birth year], 77 y.o.   MRN: [id number]    Subjective: Several BMs yesterday and +flatus.  Objective: Vital signs in last 24 hours: Temp:  [97.7 F (36.5 C)-98.6 F (37 C)] 97.9 F (36.6 C) (08/22 0800) Pulse Rate:  [72-84] 72  (08/22 0800) Resp:  [16-21] 18  (08/22 0800) BP: (105-158)/(52-78) 158/78 mmHg (08/22 0800) SpO2:  [87 %-94 %] 94 % (08/22 0800) Last BM Date: 08/10/12  Intake/Output from previous day: August 25, 2023 0701 - 08/22 0700 In: 1375.5 [I.V.:785.5; NG/GT:30; IV Piggyback:560] Out: 1125 [Urine:925; Emesis/NG output:200] Intake/Output this shift:   Physical Exam: General; awake,opens eyes to voice, no acute distress Resp: clear to auscultation bilaterally Cardio: regular rate and rhythm GI: distended but soft, mild tenderness, faint bs Mental status: appears slowed ? baseline  Lab Results:   [facility] 08/12/12 0415 08-24-2012 2136  WBC 8.7 8.4  HGB 11.3* 11.5*  HCT 34.0* 34.1*  PLT 224 233   BMET  Basename 08/12/12 0415 2012-08-24 1922  NA 156* 154*  K 2.9* 3.6  CL 119* 116*  CO2 22 22  GLUCOSE 106* 104*  BUN 78* 84*  CREATININE 1.74* 1.87*  CALCIUM 9.5 9.6   PT/INR  Basename Aug 24, 2012 1244  LABPROT 16.3*  INR 1.29   ABG No results found for this basename: PHART:2,PCO2:2,PO2:2,HCO3:2 in the last 72 hours  Studies/Results: Dg Abd 2 Views  08-24-12  *RADIOLOGY REPORT*  Clinical Data: Small bowel obstruction  ABDOMEN - 2 VIEW  Comparison: 08/10/2012  Findings: Persistent distended small bowel loops with air-fluid levels consistent with small bowel obstruction.  No free abdominal air.  IMPRESSION:  Again noted distended small bowel loops with air-fluid levels consistent with small bowel obstruction.   Original Report Authenticated By: [name] [name], M.D.     Anti-infectives: Anti-infectives     Start     Dose/Rate Route Frequency Ordered  Stop   2012-08-24 1200   cefTRIAXone (ROCEPHIN) 1 g in dextrose 5 % 50 mL IVPB        1 g 100 mL/hr over 30 Minutes Intravenous Every 24 hours 08/24/2012 1137     08/07/12 0930   cefTRIAXone (ROCEPHIN) 1 g in dextrose 5 % 50 mL IVPB  Status:  Discontinued        1 g 100 mL/hr over 30 Minutes Intravenous Every 24 hours 08/07/12 0845 08/10/12 2310          Assessment/Plan: s/p * No surgery found *  PSBO vs Ileus -  Will allow sips of clears today Aspiration precautions secondary to mental status Hypokalemia (2.9 today) management per medicine team.  ARF - per primary team, some improvement  LOS: 6 days    [name] [name] 08/12/2012

## 2012-08-12 NOTE — Evaluation (Signed)
Occupational Therapy Evaluation Patient Details Name: [name] M Balliet MRN: [id number] DOB: [birth year] Today's Date: 08/12/2012 Time: 7829-5621 OT Time Calculation (min): 35 min  OT Assessment / Plan / Recommendation Clinical Impression  77 yo female admitted with UTI, abdominal pain and worsening weakness. OT to follow acutely. Recommend SNF for d/c planning.    OT Assessment  Patient needs continued OT Services    Follow Up Recommendations  Skilled nursing facility    Barriers to Discharge      Equipment Recommendations  Defer to next venue    Recommendations for Other Services    Frequency  Other (comment) (Trial of 2 visits - due to combative cognition)    Precautions / Restrictions Precautions Precautions: Fall Restrictions Weight Bearing Restrictions: No   Pertinent Vitals/Pain none    ADL  Eating/Feeding: Performed;Maximal assistance (HHA) Where Assessed - Eating/Feeding: Edge of bed Grooming: Performed;Wash/dry face;Maximal assistance Where Assessed - Grooming: Supported sitting Transfers/Ambulation Related to ADLs: attempted SIt<>Stand x2 pt refusing and pushing ADL Comments: Pt poor cognition. Son called during session and spoke to son on phone. Pt language unintelligible and no clear sentence expressed. Pt with increased arousal at EOB and combative. Pt uncooperative throughout session.    OT Diagnosis: Generalized weakness;Cognitive deficits  OT Problem List: Decreased strength;Decreased activity tolerance;Impaired balance (sitting and/or standing);Impaired vision/perception;Decreased coordination;Decreased cognition;Decreased safety awareness;Decreased knowledge of use of DME or AE OT Treatment Interventions: Self-care/ADL training;Neuromuscular education;DME and/or AE instruction;Therapeutic activities;Patient/family education;Balance training;Cognitive remediation/compensation;Visual/perceptual remediation/compensation   OT Goals Acute Rehab OT Goals OT Goal  Formulation: With patient Time For Goal Achievement: 08/26/12 Potential to Achieve Goals: Good ADL Goals Pt Will Perform Eating: with mod assist;Supported;with cueing (comment type and amount) ADL Goal: Eating - Progress: Goal set today Pt Will Perform Grooming: with mod assist;Supported;Sitting, chair;with cueing (comment type and amount) ADL Goal: Grooming - Progress: Goal set today Miscellaneous OT Goals Miscellaneous OT Goal #1: Pt will follow 1 step simple command 50 % of session  OT Goal: Miscellaneous Goal #1 - Progress: Goal set today  Visit Information  Last OT Received On: 08/12/12 Assistance Needed: +2 PT/OT Co-Evaluation/Treatment: Yes    Subjective Data  Subjective: "Momma " "lets go if we are going"- pt combative at times during session, talking to mother not present, speech often unintelligble. Patient Stated Goal: unable to participate   Prior Functioning  Vision/Perception  Home Living Available Help at Discharge: Personal care attendant Type of Home: Assisted living Home Access: Level entry Additional Comments: Pt supine on Lt side of bed and (A)ed with posture. Pt seeking Lt side input during session. pt demonstrates Rt hand (A) with feeding and grooming during session. Pt attempting and striking therapist with Rt hand. Pt appears to be rt hand dominant. Pt focused attention level with lt inattention. pt with head rotated to the Rt. Pt with poor po intake. Pt accepting water with wet voice sounds s/p drinking. RN told SLP consult recommended. Pt requires extensive (A) with mobility and adls Prior Function Level of Independence:  (unknown) Driving: No Vocation: Retired Musician: Expressive difficulties;Other (comment) (unintelligible) Dominant Hand: Right      Cognition  Overall Cognitive Status: Impaired Area of Impairment: Attention;Following commands;Safety/judgement;Awareness of deficits;Problem solving Arousal/Alertness:  Lethargic Behavior During Session: Lethargic Current Attention Level: Focused Following Commands:  (does not follow commands) Safety/Judgement: Decreased awareness of safety precautions Safety/Judgement - Other Comments: poor Awareness of Deficits: poor Problem Solving: poor    Extremity/Trunk Assessment Right Upper Extremity Assessment RUE ROM/Strength/Tone: Within functional levels  RUE Coordination: Deficits Left Upper Extremity Assessment LUE ROM/Strength/Tone: Within functional levels LUE Coordination: Deficits Right Lower Extremity Assessment RLE ROM/Strength/Tone: Unable to fully assess;Due to impaired cognition;Deficits RLE ROM/Strength/Tone Deficits: moved LE;s while assisted or gravity eliminated, but difficult to assess Left Lower Extremity Assessment LLE ROM/Strength/Tone: Unable to fully assess;Due to impaired cognition;Deficits LLE ROM/Strength/Tone Deficits: see R LE   Mobility Bed Mobility Bed Mobility: Rolling Right;Rolling Left;Left Sidelying to Sit;Sitting - Scoot to Edge of Bed;Sit to Sidelying Left Rolling Right: 2: Max assist Rolling Left: 2: Max assist Left Sidelying to Sit: 1: +2 Total assist;HOB elevated;Other (comment) Left Sidelying to Sit: Patient Percentage: 10% Sitting - Scoot to Edge of Bed: 1: +2 Total assist Sitting - Scoot to Edge of Bed: Patient Percentage: 0% Sit to Sidelying Left: 1: +2 Total assist;HOB flat Sit to Sidelying Left: Patient Percentage: 10% Details for Bed Mobility Assistance: vc/tc's to get pt to initiate normalized movement; truncal assist /support Transfers Details for Transfer Assistance: vc/tc's for set up of standing, but pt would not initiate stand and therefore task was aborted.   Exercise    Balance Balance Balance Assessed: Yes Static Sitting Balance Static Sitting - Balance Support: Right upper extremity supported;Left upper extremity supported;Feet supported Static Sitting - Level of Assistance: 2: Max assist;1: +1  Total assist Static Sitting - Comment/# of Minutes: 15 minutes  End of Session OT - End of Session Activity Tolerance: Patient limited by fatigue Patient left: in chair;with call bell/phone within reach Nurse Communication: Precautions  GO     Lucile Shutters 08/12/2012, 4:11 PM Pager: [phone]

## 2012-08-13 LAB — CBC
HCT: 32.9 % — ABNORMAL LOW (ref 36.0–46.0)
Hemoglobin: 10.8 g/dL — ABNORMAL LOW (ref 12.0–15.0)
MCH: 27.9 pg (ref 26.0–34.0)
MCV: 85 fL (ref 78.0–100.0)
RBC: 3.87 MIL/uL (ref 3.87–5.11)
WBC: 12.3 10*3/uL — ABNORMAL HIGH (ref 4.0–10.5)

## 2012-08-13 LAB — BASIC METABOLIC PANEL
BUN: 58 mg/dL — ABNORMAL HIGH (ref 6–23)
CO2: 24 mEq/L (ref 19–32)
Calcium: 9.5 mg/dL (ref 8.4–10.5)
Chloride: 118 mEq/L — ABNORMAL HIGH (ref 96–112)
Creatinine, Ser: 1.35 mg/dL — ABNORMAL HIGH (ref 0.50–1.10)
Glucose, Bld: 159 mg/dL — ABNORMAL HIGH (ref 70–99)

## 2012-08-13 MED ORDER — PANTOPRAZOLE SODIUM 40 MG PO TBEC
40.0000 mg | DELAYED_RELEASE_TABLET | Freq: Every day | ORAL | Status: DC
  Administered 2012-08-13 – 2012-08-20 (×7): 40 mg via ORAL
  Filled 2012-08-13 (×6): qty 1

## 2012-08-13 MED ORDER — SODIUM CHLORIDE 0.9 % IJ SOLN
INTRAMUSCULAR | Status: AC
  Administered 2012-08-13: 10 mL
  Filled 2012-08-13: qty 10

## 2012-08-13 MED ORDER — HYDROMORPHONE HCL PF 1 MG/ML IJ SOLN: 0.5000 mg | INTRAMUSCULAR | Status: DC | PRN

## 2012-08-13 MED ORDER — BISACODYL 10 MG RE SUPP
10.0000 mg | Freq: Once | RECTAL | Status: AC
  Administered 2012-08-13: 10 mg via RECTAL
  Filled 2012-08-13: qty 1

## 2012-08-13 MED ORDER — QUETIAPINE FUMARATE 25 MG PO TABS
25.0000 mg | ORAL_TABLET | Freq: Every day | ORAL | Status: DC
  Administered 2012-08-14: 25 mg via ORAL
  Filled 2012-08-13 (×3): qty 1

## 2012-08-13 NOTE — Progress Notes (Signed)
Utilization review completed.  

## 2012-08-13 NOTE — Progress Notes (Signed)
Report called pt going to [phone] via bed with belongings will notify daughter of new room number. Beryle Quant

## 2012-08-13 NOTE — Progress Notes (Signed)
Clinical Social Work  Patient's dtr returned CSW phone call. CSW explained the need for patient to have SNF at dc. CSW and dtr spoke about benefits of 24 hour care and therapy at SNF. CSW reported that ALF does not feel comfortable accepting patient until she is back at baseline. Dtr stated that she had spoken to ALF this morning who was agreeable to accept patient back. Dtr does not feel comfortable with patient receiving therapy due to patient having hip problems. Dtr feels that ALF can manage patient needs.   CSW called ALF who reported that they explained to dtr that morning that the plan was for patient to go to SNF until back at baseline and then return to ALF. ALF reports that they will call dtr and verify information again and encourage patient to go to SNF at dc to ensure her safety.   CSW will continue to follow.  [place], [place] [phone]

## 2012-08-13 NOTE — Progress Notes (Signed)
Called daugther notified of new room [phone]. [name] [name]

## 2012-08-13 NOTE — Progress Notes (Signed)
Clinical Social Work  After RN called dtr to explain transfer, CSW asked RN to pass the phone. CSW and dtr discussed ALF not being able to accept patient at dc. Dtr reports if patient is unable to go to ALF then she prefers [facility]. CSW explained process and agreed to look into SNF for options. CSW will follow up with bed offers.  [place], [place] [phone]

## 2012-08-13 NOTE — Progress Notes (Signed)
PT Cancellation Note    Treatment cancelled today due to pt refused in the am and transfering out of the unit and refused in the pm 08/13/2012  [place] Bing, PT [phone] (pager)

## 2012-08-13 NOTE — Progress Notes (Signed)
Clinical Social Work  CSW spoke with son. Son stated that he was still upset to discuss dc plans since patient was not ready to dc and needed medical attention. CSW explained the need to have a plan in place. Son was agreeable to talk about SNF and CSW provided offers via phone. Son reported he was interested in places in [place] but did not want patient to go to [facility]. Son reported that he would still need to talk to sister regarding plans but agreeable for CSW to fax out patient at this time. CSW called dtr and left a message to discuss dc plans since no HCPOA paperwork is in chart.  [place], [place] [phone]

## 2012-08-13 NOTE — Progress Notes (Signed)
[facility] Progress Note [place] TEAM 1 - Stepdown/ICU TEAM   [name] [name] ZOX:[id number] DOB: 13-Dec-1935 DOA: 08/06/2012 PCP: Fredirick Maudlin, MD  Brief narrative: 77 y.o. Female who was transferred from [facility] in [place] to [place] due to Hypotension, ABD Pain, and UTI along with a history of an AAA which is being followed by Dr, Waverly Ferrari. She lives at an Assisted Living facility [facility]) and has had complaints of Abd Pain, nausea and dry heaves X 3 days. She denied constipation, or diarrhea. She also denied having fevers or chills. She had worsening weakness.   Assessment/Plan:  ARF on CKD 4- non-oliguric  - Baseline Cr about 1.7-1.9 (GFR < 30) - CT abd/pelvis on 8/14 done with contrast - cause of her renal failure is likely ATN superimposed on dehydration - she is on Demedex at home - FeNa 8.93% consistent with ATN - Hold nephrotoxic drugs - Creatinine now better than her reported baseline  Hypotension - resolved Due to dehydraton vs accumulation of anti-hypertenisves in setting of ARF - echocardiogram has raised a question of a right ventricular outflow tract obstruction - see discussion below - blood pressure has now stabilized  Sundown/ acute delirium / benzodiazepine dependence The patient's family has reported to the nursing staff the patient religiously takes her benzodiazepines at home and has been doing so for years - given a return in her renal function, she had been placed on scheduled IV Ativan - lethargy however has made it difficult to provide consistent benzodiazepine dosing - additionally I feel the sundowning is likely playing a very large role as well and may in fact be the primary culprit - we will titrate Seroquel upperward - we will transfer the patient out of the step down unit  Hypernatremia Improving  Possible right ventricular outflow tract obstruction / thrombus Echo has raised a question of a possible RVOT thrombus - a  d-dimer has been found to be markedly elevated - clearly the patient cannot receive IV contrast to allow a CT angiogram of the chest - an attempt was made to obtain a VQ scan but the patient's agitation made this impossible - we will continue full anticoagulation for now - I will check bilateral lower extremity venous Dopplers  PSBO vs/ Illeus  General Surgery is following - started on clears today  UTI / pyelonephritis Rocephin - cultures were not ordered in ER - keeping in mind pt had already been given antibiotics it is not surprising that her culture was without significant findings - in this clinical setting and given the extent to which her UA was abnormal I feel strongly that a seven-day course of empiric antibiotics is appropriate  Hypokalemia - severe Replace and follow - goal is 4.0 in this clinical setting - magnesium is normal  AAA (abdominal aortic aneurysm)  Being followed as oupt - CT performed on 8/14 - results reveal a stable 5.4 cm infrarenal aneurysm w/o leak or rupture   COPD  Stable currently   Code Status: Full Disposition Plan: Transfer to a medical bed in the interest of improving acute delirium  Consultants: Vascular surgery - signed off General surgery  Procedures: None  Antibiotics: Rocephin 08/06/2012>>  DVT prophylaxis: Full dose heparin  HPI/Subjective: The patient's mental status continues to wax and wane.  She has moments of agitation but today has been able to be redirected.  At the time of my evaluation she is sedate but appears comfortable with stable respirations and vitals.  There  is no family present at time of my visit.   Objective: Blood pressure 120/52, pulse 71, temperature 98.3 F (36.8 C), temperature source Oral, resp. rate 21, height 5\' 4"  (1.626 m), weight 85 kg (187 lb 6.3 oz), SpO2 98.00%.  Intake/Output Summary (Last 24 hours) at 08/13/12 1338 Last data filed at 08/13/12 1115  Gross per 24 hour  Intake 2756.54 ml  Output       0 ml  Net 2756.54 ml    Exam: General: No acute respiratory distress at rest Lungs: Clear to auscultation bilaterally without wheezes or crackles Cardiovascular: Tachycardic but regular without gallop rub or appreciable murmur Abdomen: Diffusely distended but not tense, bowel sounds hypoactive, no focal mass, no rebound, no appreciable ascites Extremities: No significant cyanosis, clubbing, or edema B lower extremities  Data Reviewed: Basic Metabolic Panel:  Lab 08/13/12 2130 08/12/12 0415 08/11/12 1922 08/11/12 0505 08/10/12 0345  NA 154* 156* 154* 148* 144  K 3.0* 2.9* 3.6 2.8* 3.1*  CL 118* 119* 116* 110 101  CO2 24 22 22  18* 16*  GLUCOSE 159* 106* 104* 100* 86  BUN 58* 78* 84* 94* 114*  CREATININE 1.35* 1.74* 1.87* 2.23* 3.43*  CALCIUM 9.5 9.5 9.6 9.4 8.7  MG -- -- -- -- 2.4  PHOS -- -- -- -- --   Liver Function Tests:  Lab 08/10/12 0345 08/06/12 2143  AST 186* 266*  ALT 84* 76*  ALKPHOS 141* 98  BILITOT 0.5 1.1  PROT 6.4 6.6  ALBUMIN 1.9* 1.9*   CBC:  Lab 08/13/12 0520 08/12/12 0415 08/11/12 2136 08/09/12 0530 08/08/12 0835 08/06/12 2143  WBC 12.3* 8.7 8.4 7.0 6.1 --  NEUTROABS -- -- -- -- -- 4.2  HGB 10.8* 11.3* 11.5* 11.6* 12.2 --  HCT 32.9* 34.0* 34.1* 34.3* 35.3* --  MCV 85.0 83.5 82.4 82.7 82.3 --  PLT 200 224 233 220 206 --   BNP (last 3 results)  Basename 05/23/12 1326  PROBNP 484.0*    Recent Results (from the past 240 hour(s))  MRSA PCR SCREENING     Status: Normal   Collection Time   08/06/12 11:43 PM      Component Value Range Status Comment   MRSA by PCR NEGATIVE  NEGATIVE Final   URINE CULTURE     Status: Normal   Collection Time   08/07/12  5:13 PM      Component Value Range Status Comment   Specimen Description URINE, CLEAN CATCH   Final    Special Requests NONE   Final    Culture  Setup Time 08/08/2012 03:48   Final    Colony Count 10,000 COLONIES/ML   Final    Culture     Final    Value: Multiple bacterial morphotypes present,  none predominant. Suggest appropriate recollection if clinically indicated.   Report Status 08/09/2012 FINAL   Final      Studies:  Recent x-ray studies have been reviewed in detail by the Attending Physician  Scheduled Meds:  Reviewed in detail by the Attending Physician   Lonia Blood, MD [facility] Office  [phone] Pager [phone]  On-Call/Text Page:      [email or web address]      password [facility]  08/13/2012, 1:38 PM   LOS: 7 days

## 2012-08-13 NOTE — Progress Notes (Signed)
Patient claims she had a BM. Tolerating clears. Possible advance further tomorrow. Patient examined and I agree with the assessment and plan  Violeta Gelinas, MD, MPH, FACS Pager: [phone]  08/13/2012 3:45 PM

## 2012-08-13 NOTE — Progress Notes (Signed)
Patient ID: [name] [name], female   DOB: 06/23/1935, 77 y.o.   MRN: [id number] Patient ID: [name] [name], female   DOB: 1934-12-24, 77 y.o.   MRN: [id number] Patient ID: [name] [name], female   DOB: [birth year], 77 y.o.   MRN: [id number]    Subjective: Remains confused, will attempt to open eyes to voice. No BM recorded. No report of N/V with clears.  Objective: Vital signs in last 24 hours: Temp:  [97.2 F (36.2 C)-99.5 F (37.5 C)] 98.1 F (36.7 C) (08/23 0326) Pulse Rate:  [72-83] 72  (08/23 0326) Resp:  [18-23] 21  (08/23 0326) BP: (117-148)/(47-68) 117/54 mmHg (08/23 0326) SpO2:  [97 %-100 %] 97 % (08/23 0326) Last BM Date: 08/10/12  Intake/Output from previous day: 08/22 0701 - 08/23 0700 In: 1476.5 [P.O.:240; I.V.:1236.5] Out: -  Intake/Output this shift:   Physical Exam: General;confused,opens eyes to voice, no acute distress Resp: clear to auscultation bilaterally Cardio: regular rate and rhythm GI: distended but soft, mild tenderness, faint bs Mental status: appears slowed, chronic benzo use and hypernatremia likely causes. WBC trending upward (now at 12.3)  Lab Results:   [facility] 08/13/12 0520 08/12/12 0415  WBC 12.3* 8.7  HGB 10.8* 11.3*  HCT 32.9* 34.0*  PLT 200 224   BMET  Basename 08/13/12 0520 08/12/12 0415  NA 154* 156*  K 3.0* 2.9*  CL 118* 119*  CO2 24 22  GLUCOSE 159* 106*  BUN 58* 78*  CREATININE 1.35* 1.74*  CALCIUM 9.5 9.5   PT/INR  Basename 06-Sep-2012 1244  LABPROT 16.3*  INR 1.29   ABG No results found for this basename: PHART:2,PCO2:2,PO2:2,HCO3:2 in the last 72 hours  Studies/Results: Dg Abd 2 Views  September 06, 2012  *RADIOLOGY REPORT*  Clinical Data: Small bowel obstruction  ABDOMEN - 2 VIEW  Comparison: 08/10/2012  Findings: Persistent distended small bowel loops with air-fluid levels consistent with small bowel obstruction.  No free abdominal air.  IMPRESSION:  Again noted distended small bowel loops with air-fluid levels  consistent with small bowel obstruction.   Original Report Authenticated By: Natasha Mead, M.D.     Anti-infectives: Anti-infectives     Start     Dose/Rate Route Frequency Ordered Stop   06-Sep-2012 1200   cefTRIAXone (ROCEPHIN) 1 g in dextrose 5 % 50 mL IVPB        1 g 100 mL/hr over 30 Minutes Intravenous Every 24 hours 09/06/2012 1137     08/07/12 0930   cefTRIAXone (ROCEPHIN) 1 g in dextrose 5 % 50 mL IVPB  Status:  Discontinued        1 g 100 mL/hr over 30 Minutes Intravenous Every 24 hours 08/07/12 0845 08/10/12 2310          Assessment/Plan: s/p * No surgery found *  PSBO vs Ileus -  Will allow sips of clears today Aspiration precautions secondary to mental status Hypokalemia, Hypernatremia   management per medicine team.  ARF - per primary team, some improvement  LOS: 7 days    Quinette Hentges 08/13/2012

## 2012-08-13 NOTE — Progress Notes (Signed)
Clinical Social Work  CSW called dtr and left a message in order to discuss dc plans. ALF reports they have spoken with dtr regarding plans so CSW wants to follow up with dtr regarding SNF options. CSW will continue to follow.  [place], [place] [phone]

## 2012-08-13 NOTE — Progress Notes (Addendum)
Clinical Social Work Department CLINICAL SOCIAL WORK PLACEMENT NOTE 08/13/2012  Patient:  [name] [name], [name] [name]  Account Number:  [id number] Admit date:  08/06/2012  Clinical Social Worker:  Unk Lightning, LCSW  Date/time:  08/13/2012 03:00 PM  Clinical Social Work is seeking post-discharge placement for this patient at the following level of care:   SKILLED NURSING   (*CSW will update this form in Epic as items are completed)   08/13/2012  Patient/family provided with [facility] Department of Clinical Social Work's list of facilities offering this level of care within the geographic area requested by the patient (or if unable, by the patient's family).  08/13/2012  Patient/family informed of their freedom to choose among providers that offer the needed level of care, that participate in Medicare, Medicaid or managed care program needed by the patient, have an available bed and are willing to accept the patient.  08/13/2012  Patient/family informed of [facility]' ownership interest in [facility], as well as of the fact that they are under no obligation to receive care at this facility.  PASARR submitted to EDS on 08/13/2012 PASARR number received from EDS on   FL2 transmitted to all facilities in geographic area requested by pt/family on  08/13/2012 FL2 transmitted to all facilities within larger geographic area on   Patient informed that his/her managed care company has contracts with or will negotiate with  certain facilities, including the following:     Patient/family informed of bed offers received:  08/18/2012 Patient chooses bed at [facility] Physician recommends and patient chooses bed at    Patient to be transferred to  [facility] on 08/20/2012 Patient to be transferred to facility by ambulance Sharin Mons)  The following physician request were entered in Epic:   Additional Comments:

## 2012-08-14 ENCOUNTER — Inpatient Hospital Stay (HOSPITAL_COMMUNITY): Payer: Medicare Other

## 2012-08-14 DIAGNOSIS — N390 Urinary tract infection, site not specified: Secondary | ICD-10-CM

## 2012-08-14 DIAGNOSIS — I2699 Other pulmonary embolism without acute cor pulmonale: Secondary | ICD-10-CM

## 2012-08-14 DIAGNOSIS — R1310 Dysphagia, unspecified: Secondary | ICD-10-CM

## 2012-08-14 DIAGNOSIS — D649 Anemia, unspecified: Secondary | ICD-10-CM

## 2012-08-14 DIAGNOSIS — G9340 Encephalopathy, unspecified: Secondary | ICD-10-CM

## 2012-08-14 HISTORY — DX: Other pulmonary embolism without acute cor pulmonale: I26.99

## 2012-08-14 LAB — CBC
HCT: 32.3 % — ABNORMAL LOW (ref 36.0–46.0)
MCH: 28 pg (ref 26.0–34.0)
MCHC: 32.8 g/dL (ref 30.0–36.0)
RDW: 16 % — ABNORMAL HIGH (ref 11.5–15.5)

## 2012-08-14 LAB — BASIC METABOLIC PANEL
BUN: 42 mg/dL — ABNORMAL HIGH (ref 6–23)
Calcium: 9.1 mg/dL (ref 8.4–10.5)
GFR calc Af Amer: 54 mL/min — ABNORMAL LOW (ref >90)
GFR calc non Af Amer: 47 mL/min — ABNORMAL LOW (ref >90)
Potassium: 2.5 mEq/L — CL (ref 3.5–5.1)

## 2012-08-14 LAB — HEPARIN LEVEL (UNFRACTIONATED)
Heparin Unfractionated: <0.1 IU/mL — ABNORMAL LOW (ref 0.30–0.70)
Heparin Unfractionated: <0.1 IU/mL — ABNORMAL LOW (ref 0.30–0.70)

## 2012-08-14 MED ORDER — LORAZEPAM 2 MG/ML IJ SOLN
0.5000 mg | Freq: Three times a day (TID) | INTRAMUSCULAR | Status: DC
  Administered 2012-08-14 – 2012-08-19 (×15): 0.5 mg via INTRAVENOUS
  Filled 2012-08-14 (×15): qty 1

## 2012-08-14 MED ORDER — HEPARIN BOLUS VIA INFUSION
2000.0000 [IU] | Freq: Once | INTRAVENOUS | Status: AC
  Administered 2012-08-14: 2000 [IU] via INTRAVENOUS
  Filled 2012-08-14: qty 2000

## 2012-08-14 MED ORDER — POTASSIUM CHLORIDE 10 MEQ/100ML IV SOLN
10.0000 meq | INTRAVENOUS | Status: AC
  Administered 2012-08-14 (×4): 10 meq via INTRAVENOUS
  Filled 2012-08-14 (×4): qty 100

## 2012-08-14 MED ORDER — KCL IN DEXTROSE-NACL 20-5-0.45 MEQ/L-%-% IV SOLN
INTRAVENOUS | Status: DC
  Administered 2012-08-14: 75 mL/h via INTRAVENOUS
  Administered 2012-08-15 – 2012-08-16 (×2): via INTRAVENOUS
  Filled 2012-08-14 (×5): qty 1000

## 2012-08-14 MED ORDER — MORPHINE SULFATE 2 MG/ML IJ SOLN
2.0000 mg | INTRAMUSCULAR | Status: DC | PRN
  Administered 2012-08-14 – 2012-08-19 (×9): 2 mg via INTRAVENOUS
  Filled 2012-08-14 (×9): qty 1

## 2012-08-14 NOTE — Progress Notes (Signed)
[facility] PROGRESS NOTE  [name] M Pequignot JXB:[id number] DOB: [birth year] DOA: 08/06/2012 PCP: Fredirick Maudlin, MD  Assessment/Plan: 1. SBO vs Ileus--per surgery. Place PICC 8/25 if creatinine stable, then start TPN.  2. Acute renal failure/CKD III--Improved over previous baseline, BUN trending downwards. Suspect secondary to diuretics (BUN/creatinine pattern prerenal), possible component of Voltaren gel. CT abd/pelvis on 8/14 done without IV contrast (oral only) secondary to high creatinine.  3. Hypernatremia--resolving on d5W, likely secondary to IVF in setting of minimal PO intake (hyperchloremia noted as well).  4. Hypokalemia--replete. Check serum magnesium 8/25. 5. Acute encephalopathy--Suspect narcotic & BZD withdrawal. Developed suddenly 8/21 (all previous notes report alert, normal mental status). Of note, it appears patient was on scheduled Lortab TID and Xanax TID as outpatient, neither continued on admission. No listed history of dementia, not on any medications for dementia or mood disorder as an outpatient. Exam non-focal, could consider CT head if fails to improved. D/c seroquel. 6. Possible right ventricular outflow tract obstruction/thrombus--Echo notable for possible RVOT thrombus--d-dimer markedly elevated--cannot get CTA chest - unable to get VQ scan secondary to patient's agitation. Continue full anticoagulation for now. LE dopplers negative. 7. UTI--treated with 8 days Rocephin, no culture on admission. Patient had already been given antibiotics, therefore later culture unreliable. 8. Hypotension--resolved. Multifactorial--diuretic-inducted dehydraton coupled with anti-hypertenisves in setting of ARF. Echocardiogram suggest possible RVOT. 9. Dysphagia--Needs repeat speech evaluation when clinically improved. 10. Elevated transaminases--recheck in AM.  11. Low albumin--nutrition consult. 12. Leukocytosis--has developed over the last 2 days while on antibiotics. Monitor,  serial abdominal exams. 13. Normocytic anemia--stable. 14. SVT 8/21--no recurrence. Cardiac enzymes negative. Monitor. Related to RVOT obstruction? 15. AAA--Followed by Dr. Calla Kicks in 6 months. CT 8/14--stable 5.4 cm infrarenal aneurysm w/o leak or rupture  16. Afib?--unclear history, not on warfarin.  17. Depression with anxiety--continue BZD. 18. COPD--Stable currently  19. Chronic hip pain--by history too high risk for hip surgery. Morphine for pain control. 20. RBBB--chronic.  Code Status: Full Family Communication: left messages for son and daughter at [phone], [phone] Disposition Plan: ALF vs. SNF  Brendia Sacks, MD  [facility] Team 8 Pager [phone]. If 8PM-8AM, please contact night-coverage at [email or web address], password [facility] 08/14/2012, 11:59 AM  LOS: 8 days   Brief narrative: 3 yow who was transferred from [facility] to hypotension, ABD pain, UTI along with a history of an AAA which is being followed by Dr, Waverly Ferrari. She lives at an Assisted Living facility [facility]) and has had complaints of Abd Pain, nausea and dry heaves X 3 days. She denied constipation, or diarrhea. She also denied having fevers or chills. She had worsening weakness. Admitted for hypotension, possible early sepsis. Seen by vascular surguery for history of AAA (stable) and signed off. Abdominal films revealed SBO versus ileus and patient was seen by general surgery. Noted to be confused 8/21 (day 6 of hospitalization)  Consultants:  General surgery  Vascular surgery--signed off  PT--SNF  OT--SNF  ST--thin liquids (patient refused other consistencies)  Procedures:  8/20 2D echocardiogram--LVEF 60-65%, grade 1 diastolic dysfunction. Possible RVOT thrombus.  8/24 BLE venous doppler--No obvious evidence of DVT, superficial thrombosis, or Baker's Cyst. Technically difficult study due to patient cooperation.   Antibiotics:  Rocephin  8/17>>8/24  HPI/Subjective: AF, vitals stable. Minimal oral intake. Excellent UOP. Combative per RN. Patient denies pain.  Objective: Filed Vitals:   08/13/12 1200 08/13/12 1600 08/13/12 2200 08/14/12 0554  BP:  138/85 134/87 132/85  Pulse:  78 75 80  Temp: 98.3 F (36.8  C) 98 F (36.7 C) 99.1 F (37.3 C) 98.2 F (36.8 C)  TempSrc: Oral Oral Oral Oral  Resp:  20 18 18   Height:      Weight:  87.5 kg (192 lb 14.4 oz)    SpO2:  95% 94% 96%    Intake/Output Summary (Last 24 hours) at 08/14/12 1159 Last data filed at 08/14/12 0830  Gross per 24 hour  Intake   1790 ml  Output      0 ml  Net   1790 ml   Filed Weights   08/06/12 1949 08/13/12 1600  Weight: 85 kg (187 lb 6.3 oz) 87.5 kg (192 lb 14.4 oz)    Exam:   General:  Appears comfortable, mildly agitated, easily redirectable. Wearing soft mitts. Speech somewhat difficult to understand, but alert and oriented to self, month; not year or location. Follows commands.  Eyes: PERRL, normal lids, irises, conjunctiva  ENT: grossly normal hearing, lips and tongue appear dry  Neck: no LAD, masses or thyromegaly  Cardiovascular: RRR, no m/r/g. No LE edema  Respiratory: CTA bilaterally, no w/r/r. Normal respiratory effort.  Abdomen: distended, soft, non-tender, decreased bowel sounds  Skin: no rash or induration; non-tender  Musculoskeletal: moves all extremities to command, poor effort with BLE extremites, but moves to command  Neuro: CN II-XII intact (follows commands)  Psychiatric: as above  Data Reviewed: Basic Metabolic Panel:  Lab 08/14/12 8295 08/13/12 0520 08/12/12 0415 08/11/12 1922 08/11/12 0505 08/10/12 0345  NA 147* 154* 156* 154* 148* --  K 2.5* 3.0* 2.9* 3.6 2.8* --  CL 111 118* 119* 116* 110 --  CO2 24 24 22 22  18* --  GLUCOSE 131* 159* 106* 104* 100* --  BUN 42* 58* 78* 84* 94* --  CREATININE 1.13* 1.35* 1.74* 1.87* 2.23* --  CALCIUM 9.1 9.5 9.5 9.6 9.4 --  MG -- -- -- -- -- 2.4  PHOS -- -- --  -- -- --   Liver Function Tests:  Lab 08/10/12 0345  AST 186*  ALT 84*  ALKPHOS 141*  BILITOT 0.5  PROT 6.4  ALBUMIN 1.9*   CBC:  Lab 08/14/12 0510 08/13/12 0520 08/12/12 0415 08/11/12 2136 08/09/12 0530  WBC 14.7* 12.3* 8.7 8.4 7.0  NEUTROABS -- -- -- -- --  HGB 10.6* 10.8* 11.3* 11.5* 11.6*  HCT 32.3* 32.9* 34.0* 34.1* 34.3*  MCV 85.2 85.0 83.5 82.4 82.7  PLT 174 200 224 233 220    Basename 05/23/12 1326  PROBNP 484.0*    Recent Results (from the past 240 hour(s))  MRSA PCR SCREENING     Status: Normal   Collection Time   08/06/12 11:43 PM      Component Value Range Status Comment   MRSA by PCR NEGATIVE  NEGATIVE Final   URINE CULTURE     Status: Normal   Collection Time   08/07/12  5:13 PM      Component Value Range Status Comment   Specimen Description URINE, CLEAN CATCH   Final    Special Requests NONE   Final    Culture  Setup Time 08/08/2012 03:48   Final    Colony Count 10,000 COLONIES/ML   Final    Culture     Final    Value: Multiple bacterial morphotypes present, none predominant. Suggest appropriate recollection if clinically indicated.   Report Status 08/09/2012 FINAL   Final      Studies: Dg Abd 1 View  08/14/2012  *RADIOLOGY REPORT*  Clinical Data: Abdominal distention.  ABDOMEN - 1 VIEW  Comparison: 08/11/2012  Findings: Supine images of the abdomen demonstrate gaseous distention of the stomach.  There are still gas-filled loops of small bowel.  Overall, there is decreased small bowel distention. Again noted is sclerosis and abnormality of the femoral heads. There is high-density material contrast within the rectum.  The nasogastric tube has been removed.  Large gas-filled structure in the right lower quadrant is nonspecific and could be associated with the cecum. Limited evaluation for free air on the supine images.  IMPRESSION: Decreased small bowel gaseous distention.  Slightly increased gaseous distention of the stomach since removal of the  nasogastric tube.   Original Report Authenticated By: Richarda Overlie, M.D.    Scheduled Meds:   . cefTRIAXone (ROCEPHIN)  IV  1 g Intravenous Q24H  . heparin  2,000 Units Intravenous Once  . LORazepam  0.5 mg Intravenous Q12H  . pantoprazole  40 mg Oral Q1200  . QUEtiapine  25 mg Oral QHS  . tiotropium  18 mcg Inhalation Daily  . DISCONTD: pantoprazole (PROTONIX) IV  40 mg Intravenous Q1200  . DISCONTD: QUEtiapine  12.5 mg Oral QHS   Continuous Infusions:   . dextrose 75 mL/hr at 08/14/12 1610  . heparin 1,300 Units/hr (08/14/12 1051)    Principal Problem:  *ARF on CKD 4 Active Problems:  Small bowel obstruction  Hypernatremia  ARTHRITIS, HIPS, BILATERAL  AAA (abdominal aortic aneurysm)  COPD (chronic obstructive pulmonary disease)  Hypotension  Acute encephalopathy  Pulmonary embolism  UTI (lower urinary tract infection)  Dysphagia  Anemia  Depression     Brendia Sacks, MD  [facility] Team 8 Pager [phone]. If 8PM-8AM, please contact night-coverage at [email or web address], password [facility] 08/14/2012, 11:59 AM  LOS: 8 days   Time spent: 60 minutes

## 2012-08-14 NOTE — Progress Notes (Signed)
VASCULAR LAB PRELIMINARY  PRELIMINARY  PRELIMINARY  PRELIMINARY  Bilateral lower extremity venous duplex  completed.    Preliminary report:  Bilateral:  No obvious evidence of DVT, superficial thrombosis, or Baker's Cyst.  Technically difficult study due to patient cooperation.     Keylin Podolsky, RVT 08/14/2012, 10:37 AM

## 2012-08-14 NOTE — Progress Notes (Signed)
SLP Cancellation Note  Treatment cancelled today due to patient's refusal to participate. Per RN, pt is not taking any po.  ST to continue efforts Monday 8/26. Celia B. [place], [facility], CCC-SLP [phone] Leigh Aurora 08/14/2012, 3:14 PM

## 2012-08-14 NOTE — Progress Notes (Signed)
RN checked pt's wrists with mittens and offered pt fluids q2 hours

## 2012-08-14 NOTE — Progress Notes (Signed)
  Subjective: Confused.  Speech unintelligible.   Objective: Vital signs in last 24 hours: Temp:  [97.5 F (36.4 C)-99.1 F (37.3 C)] 98.2 F (36.8 C) (08/24 0554) Pulse Rate:  [71-80] 80  (08/24 0554) Resp:  [18-21] 18  (08/24 0554) BP: (120-138)/(52-87) 132/85 mmHg (08/24 0554) SpO2:  [94 %-98 %] 96 % (08/24 0554) Weight:  [192 lb 14.4 oz (87.5 kg)] 192 lb 14.4 oz (87.5 kg) (08/23 1600) Last BM Date: 08/13/12  Intake/Output from previous day: 08/23 0701 - 08/24 0700 In: 3579 [P.O.:100; I.V.:3379; IV Piggyback:100] Out: -  Intake/Output this shift:    GI: soft with occ BS.  Mild distension.  No peritonitis.  Lab Results:   Basename 08/14/12 0510 08/13/12 0520  WBC 14.7* 12.3*  HGB 10.6* 10.8*  HCT 32.3* 32.9*  PLT 174 200   BMET  Basename 08/14/12 0510 08/13/12 0520  NA 147* 154*  K 2.5* 3.0*  CL 111 118*  CO2 24 24  GLUCOSE 131* 159*  BUN 42* 58*  CREATININE 1.13* 1.35*  CALCIUM 9.1 9.5   PT/INR  Basename 08/11/12 1244  LABPROT 16.3*  INR 1.29   ABG No results found for this basename: PHART:2,PCO2:2,PO2:2,HCO3:2 in the last 72 hours  Studies/Results: No results found.  Anti-infectives: Anti-infectives     Start     Dose/Rate Route Frequency Ordered Stop   08/11/12 1200   cefTRIAXone (ROCEPHIN) 1 g in dextrose 5 % 50 mL IVPB        1 g 100 mL/hr over 30 Minutes Intravenous Every 24 hours 08/11/12 1137     08/07/12 0930   cefTRIAXone (ROCEPHIN) 1 g in dextrose 5 % 50 mL IVPB  Status:  Discontinued        1 g 100 mL/hr over 30 Minutes Intravenous Every 24 hours 08/07/12 0845 08/10/12 2310          Assessment/Plan: PSBO  Pt states she had a BM.  MS makes history difficult.  Check KUB today and Sunday.  Watch wbc.  Exam shows no peritoneal signs.   LOS: 8 days    Zissy Hamlett A. 08/14/2012

## 2012-08-14 NOTE — Progress Notes (Signed)
08/14/12 Lab called with critcal value of K+ 2.5.

## 2012-08-14 NOTE — Progress Notes (Signed)
ANTICOAGULATION CONSULT NOTE - Follow Up Consult  Pharmacy Consult for Heparin Indication:  Possible large R-ventricular outflow obstruction/thrombus [pulmonary embolus]  Allergies Allergen  . Asa (Aspirin)  . Lidocaine  . Nsaids  . Sulfonamide Derivatives    Patient Measurements: Height: 5\' 4"  (162.6 cm) Weight: 192 lb 14.4 oz (87.5 kg) IBW/kg (Calculated) : 54.7  Heparin dosing weight 74.3 kg  Vital Signs: BP 132/85  Pulse 80  Temp 98.2 F (36.8 C) (Oral)  Resp 18  Ht 5\' 4"  (1.626 m)  Wt 192 lb 14.4 oz (87.5 kg)  BMI 33.11 kg/m2  SpO2 96%  Labs:  Basename 08/11/12 0505 08/10/12 0345 08/09/12 0530  HGB -- -- 11.6*  HCT -- -- 34.3*  PLT -- -- 220  APTT Pending -- --  LABPROT Pending -- --  INR Pending -- --  CREATININE 2.23* 3.43* 4.51*   Estimated Creatinine Clearance: 46.8 ml/min (by C-G formula based on Cr of 1.13).  Lab Results   Most recent  Component Value Date   INR 1.03 05/23/2012    Medical History: Past Medical History  Diagnosis Date  . Hypertension   . Hyperlipidemia   . COPD (chronic obstructive pulmonary disease)   . Depression with anxiety   . GERD (gastroesophageal reflux disease)   . Hiatal hernia   . Atrial fibrillation   . Productive cough   . Leg pain   . AAA (abdominal aortic aneurysm)   . CAD (coronary artery disease)   . Chronic kidney disease    Past Surgical History  Procedure Date  . Kidney stone surgery   . Cystectomy     Medications:  Prescriptions prior to admission  Medication Sig Dispense Refill  . acetaminophen (TYLENOL) 500 MG tablet Take 500 mg by mouth every 4 (four) hours as needed. For pain      . albuterol (PROVENTIL HFA;VENTOLIN HFA) 108 (90 BASE) MCG/ACT inhaler Inhale 2 puffs into the lungs every 4 (four) hours as needed. For wheezing      . ALPRAZolam (XANAX) 1 MG tablet Take 1 mg by mouth 3 (three) times daily.      . Alum & Mag Hydroxide-Simeth (ANTACID LIQUID PO) Take 30 mLs by mouth every 2 (two)  hours as needed. Heartburn      . amLODipine (NORVASC) 2.5 MG tablet Take 2.5 mg by mouth daily.      . Choline Fenofibrate (TRILIPIX) 135 MG capsule Take 135 mg by mouth daily.      . diclofenac sodium (VOLTAREN) 1 % GEL Apply 1 application topically 4 (four) times daily. To hip      . HYDROcodone-acetaminophen (NORCO) 10-325 MG per tablet Take 1 tablet by mouth 3 (three) times daily.      Marland Kitchen HYDROcodone-homatropine (HYCODAN) 5-1.5 MG/5ML syrup Take 5 mLs by mouth every 4 (four) hours as needed. For cough      . nebivolol (BYSTOLIC) 5 MG tablet Take 5 mg by mouth daily.      Marland Kitchen omega-3 acid ethyl esters (LOVAZA) 1 G capsule Take 1 g by mouth daily.       Marland Kitchen omeprazole (PRILOSEC) 20 MG capsule Take 20 mg by mouth daily.        . rosuvastatin (CRESTOR) 40 MG tablet Take 40 mg by mouth daily.        . simethicone (MYLICON) 80 MG chewable tablet Chew 80 mg by mouth 3 (three) times daily.       Marland Kitchen tiotropium (SPIRIVA HANDIHALER) 18 MCG inhalation capsule  Place 18 mcg into inhaler and inhale daily.        Marland Kitchen torsemide (DEMADEX) 20 MG tablet Take 20 mg by mouth 2 (two) times daily.      Marland Kitchen torsemide (DEMADEX) 20 MG tablet Take 20 mg by mouth daily as needed. For legs       Scheduled:    . cefTRIAXone (ROCEPHIN)  IV  1 g Intravenous Q24H  . LORazepam  0.5 mg Intravenous Q6H  . pantoprazole (PROTONIX) IV  40 mg Intravenous Q1200  . potassium chloride  10 mEq Intravenous Q1 Hr x 4  . potassium chloride  10 mEq Intravenous Q1 Hr x 5  . sodium chloride  500 mL Intravenous Once  . tiotropium  18 mcg Inhalation Daily    Anti-infectives     Start     Dose/Rate Route Frequency Ordered Stop   08/11/12 1200   cefTRIAXone (ROCEPHIN) 1 g in dextrose 5 % 50 mL IVPB        1 g 100 mL/hr over 30 Minutes Intravenous Every 24 hours 08/11/12 1137            Assessment:  77 y.o. Female who was transferred from [facility] in [place] to [facility] with Hypotension, ABD Pain, UTI, and with a history of an  AAA.   CT reports a possible large R-ventricular outflow obstruction/thrombus.  Empiric full dose Heparin ordered to cover for thrombosis.   Heparin level dropped to < 0.10 today with no rate change. Confirmed with nurse no issues or interruptions of infusion.     Goal of Therapy:   Heparin level 0.3-0.7 units/ml   Plan:  1. Bolus heparin 2000 units. 2. Increase heparin rate to 1300 units/h. 3. 8 hour heparin level - 1700. 4. Daily heparin level, CBC.    Doris Cheadle, PharmD Clinical Pharmacist Pager: [phone] Phone: [phone] 08/14/2012 8:02 AM

## 2012-08-14 NOTE — Progress Notes (Signed)
ANTICOAGULATION CONSULT NOTE - Follow Up Consult  Pharmacy Consult for Heparin Indication:  Possible large R-ventricular outflow obstruction/thrombus [pulmonary embolus]  Allergies Allergen  . Asa (Aspirin)  . Lidocaine  . Nsaids  . Sulfonamide Derivatives    Patient Measurements: Height: 5\' 4"  (162.6 cm) Weight: 192 lb 14.4 oz (87.5 kg) IBW/kg (Calculated) : 54.7  Heparin dosing weight 74.3 kg  Vital Signs: BP 130/76  Pulse 76  Temp 98.4 F (36.9 C) (Oral)  Resp 18  Ht 5\' 4"  (1.626 m)  Wt 192 lb 14.4 oz (87.5 kg)  BMI 33.11 kg/m2  SpO2 97%  Labs:  Basename 08/11/12 0505 08/10/12 0345 08/09/12 0530  HGB -- -- 11.6*  HCT -- -- 34.3*  PLT -- -- 220  APTT Pending -- --  LABPROT Pending -- --  INR Pending -- --  CREATININE 2.23* 3.43* 4.51*   Estimated Creatinine Clearance: 46.8 ml/min (by C-G formula based on Cr of 1.13).  Lab Results   Most recent  Component Value Date   INR 1.03 05/23/2012    Medical History: Past Medical History  Diagnosis Date  . Hypertension   . Hyperlipidemia   . COPD (chronic obstructive pulmonary disease)   . Depression with anxiety   . GERD (gastroesophageal reflux disease)   . Hiatal hernia   . Atrial fibrillation   . Productive cough   . Leg pain   . AAA (abdominal aortic aneurysm)   . CAD (coronary artery disease)   . Chronic kidney disease    Past Surgical History  Procedure Date  . Kidney stone surgery   . Cystectomy     Medications:  Prescriptions prior to admission  Medication Sig Dispense Refill  . acetaminophen (TYLENOL) 500 MG tablet Take 500 mg by mouth every 4 (four) hours as needed. For pain      . albuterol (PROVENTIL HFA;VENTOLIN HFA) 108 (90 BASE) MCG/ACT inhaler Inhale 2 puffs into the lungs every 4 (four) hours as needed. For wheezing      . ALPRAZolam (XANAX) 1 MG tablet Take 1 mg by mouth 3 (three) times daily.      . Alum & Mag Hydroxide-Simeth (ANTACID LIQUID PO) Take 30 mLs by mouth every 2 (two)  hours as needed. Heartburn      . amLODipine (NORVASC) 2.5 MG tablet Take 2.5 mg by mouth daily.      . Choline Fenofibrate (TRILIPIX) 135 MG capsule Take 135 mg by mouth daily.      . diclofenac sodium (VOLTAREN) 1 % GEL Apply 1 application topically 4 (four) times daily. To hip      . HYDROcodone-acetaminophen (NORCO) 10-325 MG per tablet Take 1 tablet by mouth 3 (three) times daily.      Marland Kitchen HYDROcodone-homatropine (HYCODAN) 5-1.5 MG/5ML syrup Take 5 mLs by mouth every 4 (four) hours as needed. For cough      . nebivolol (BYSTOLIC) 5 MG tablet Take 5 mg by mouth daily.      Marland Kitchen omega-3 acid ethyl esters (LOVAZA) 1 G capsule Take 1 g by mouth daily.       Marland Kitchen omeprazole (PRILOSEC) 20 MG capsule Take 20 mg by mouth daily.        . rosuvastatin (CRESTOR) 40 MG tablet Take 40 mg by mouth daily.        . simethicone (MYLICON) 80 MG chewable tablet Chew 80 mg by mouth 3 (three) times daily.       Marland Kitchen tiotropium (SPIRIVA HANDIHALER) 18 MCG inhalation capsule  Place 18 mcg into inhaler and inhale daily.        Marland Kitchen torsemide (DEMADEX) 20 MG tablet Take 20 mg by mouth 2 (two) times daily.      Marland Kitchen torsemide (DEMADEX) 20 MG tablet Take 20 mg by mouth daily as needed. For legs       Scheduled:    . cefTRIAXone (ROCEPHIN)  IV  1 g Intravenous Q24H  . LORazepam  0.5 mg Intravenous Q6H  . pantoprazole (PROTONIX) IV  40 mg Intravenous Q1200  . potassium chloride  10 mEq Intravenous Q1 Hr x 4  . potassium chloride  10 mEq Intravenous Q1 Hr x 5  . sodium chloride  500 mL Intravenous Once  . tiotropium  18 mcg Inhalation Daily    Anti-infectives     Start     Dose/Rate Route Frequency Ordered Stop   08/11/12 1200   cefTRIAXone (ROCEPHIN) 1 g in dextrose 5 % 50 mL IVPB        1 g 100 mL/hr over 30 Minutes Intravenous Every 24 hours 08/11/12 1137            Assessment:  77 y.o. Female who was transferred from [facility] in [place] to [facility] with Hypotension, ABD Pain, UTI, and with a history of an  AAA.   CT reports a possible large R-ventricular outflow obstruction/thrombus.  Empiric full dose Heparin ordered to cover for thrombosis.   Heparin level remains < 0.10 today with no rate change. Confirmed with nurse no issues or interruptions of infusion.     Goal of Therapy:   Heparin level 0.3-0.7 units/ml   Plan:  1. Bolus heparin 2000 units. 2. Increase heparin rate to 1500 units/h. 3. 8 hour heparin level 4. Daily heparin level, CBC.    Thank you for allowing pharmacy to be a part of this patients care team.  Lovenia Kim Pharm.D., BCPS Clinical Pharmacist 08/14/2012 6:01 PM Pager: [phone] Phone: [phone]

## 2012-08-15 ENCOUNTER — Inpatient Hospital Stay (HOSPITAL_COMMUNITY): Payer: Medicare Other

## 2012-08-15 DIAGNOSIS — K567 Ileus, unspecified: Secondary | ICD-10-CM

## 2012-08-15 DIAGNOSIS — K560 Paralytic ileus: Principal | ICD-10-CM

## 2012-08-15 LAB — COMPREHENSIVE METABOLIC PANEL
BUN: 36 mg/dL — ABNORMAL HIGH (ref 6–23)
CO2: 25 mEq/L (ref 19–32)
Calcium: 8.8 mg/dL (ref 8.4–10.5)
Creatinine, Ser: 1.02 mg/dL (ref 0.50–1.10)
GFR calc Af Amer: 61 mL/min — ABNORMAL LOW (ref >90)
GFR calc non Af Amer: 53 mL/min — ABNORMAL LOW (ref >90)
Glucose, Bld: 132 mg/dL — ABNORMAL HIGH (ref 70–99)
Total Protein: 5.8 g/dL — ABNORMAL LOW (ref 6.0–8.3)

## 2012-08-15 LAB — CBC
HCT: 31.2 % — ABNORMAL LOW (ref 36.0–46.0)
Hemoglobin: 10.1 g/dL — ABNORMAL LOW (ref 12.0–15.0)
MCH: 27.9 pg (ref 26.0–34.0)
MCHC: 32.4 g/dL (ref 30.0–36.0)
MCV: 86.2 fL (ref 78.0–100.0)
RBC: 3.62 MIL/uL — ABNORMAL LOW (ref 3.87–5.11)

## 2012-08-15 LAB — GLUCOSE, CAPILLARY: Glucose-Capillary: 108 mg/dL — ABNORMAL HIGH (ref 70–99)

## 2012-08-15 LAB — MAGNESIUM: Magnesium: 1.3 mg/dL — ABNORMAL LOW (ref 1.5–2.5)

## 2012-08-15 MED ORDER — MAGNESIUM SULFATE 40 MG/ML IJ SOLN
2.0000 g | Freq: Once | INTRAMUSCULAR | Status: AC
  Administered 2012-08-15: 2 g via INTRAVENOUS
  Filled 2012-08-15: qty 50

## 2012-08-15 MED ORDER — CLINIMIX E/DEXTROSE (5/15) 5 % IV SOLN
INTRAVENOUS | Status: AC
  Administered 2012-08-15: 18:00:00 via INTRAVENOUS
  Filled 2012-08-15: qty 1000

## 2012-08-15 MED ORDER — SODIUM CHLORIDE 0.9 % IJ SOLN
10.0000 mL | INTRAMUSCULAR | Status: DC | PRN
  Administered 2012-08-15 – 2012-08-17 (×3): 10 mL
  Administered 2012-08-17: 30 mL
  Administered 2012-08-18 (×2): 10 mL
  Administered 2012-08-19 (×2): 20 mL
  Administered 2012-08-20 (×2): 10 mL

## 2012-08-15 MED ORDER — PROMETHAZINE HCL 25 MG/ML IJ SOLN
12.5000 mg | Freq: Four times a day (QID) | INTRAMUSCULAR | Status: DC | PRN
  Administered 2012-08-15: 12.5 mg via INTRAVENOUS
  Filled 2012-08-15: qty 1

## 2012-08-15 MED ORDER — POTASSIUM CHLORIDE 10 MEQ/100ML IV SOLN
10.0000 meq | INTRAVENOUS | Status: AC
  Administered 2012-08-15 (×6): 10 meq via INTRAVENOUS
  Filled 2012-08-15 (×6): qty 100

## 2012-08-15 MED ORDER — SODIUM CHLORIDE 0.9 % IJ SOLN
10.0000 mL | Freq: Two times a day (BID) | INTRAMUSCULAR | Status: DC
  Administered 2012-08-16: 10 mL

## 2012-08-15 MED ORDER — INSULIN ASPART 100 UNIT/ML ~~LOC~~ SOLN
0.0000 [IU] | Freq: Three times a day (TID) | SUBCUTANEOUS | Status: DC
  Administered 2012-08-16 (×2): 1 [IU] via SUBCUTANEOUS

## 2012-08-15 MED ORDER — HEPARIN BOLUS VIA INFUSION
1500.0000 [IU] | Freq: Once | INTRAVENOUS | Status: AC
  Administered 2012-08-15: 1500 [IU] via INTRAVENOUS
  Filled 2012-08-15: qty 1500

## 2012-08-15 NOTE — Progress Notes (Signed)
[facility] PROGRESS NOTE  [name] [name] WJX:[id number] DOB: [birth year] DOA: 08/06/2012 PCP: Fredirick Maudlin, MD  Assessment/Plan: 1. Ileus--AXR shows persistent ileus pattern. Management per surgery. Place PICC, start TPN.  2. Acute renal failure-Nearly resolved. Continue IVF. Suspect secondary to diuretics (BUN/creatinine pattern prerenal), possible component of Voltaren gel. CT abd/pelvis on 8/14 done without IV contrast (oral only) secondary to high creatinine. Question whether contrast was used at [facility] ED. 3. Hypernatremia--Resolved, likely secondary to IVF in setting of minimal PO intake (hyperchloremia noted as well).  4. Hypokalemia--replete. Replete magnesium. 5. Acute encephalopathy--No change. Suspect narcotic & BZD withdrawal. Developed suddenly 8/21 (all previous notes report alert, normal mental status). Of note, ipatient was on scheduled Lortab TID and Xanax TID as outpatient (Xanax for 20 years per family), neither continued on admission. No listed history of dementia, not on any medications for dementia or mood disorder as an outpatient. Exam non-focal, infectious workup negative. No focal deficits, but check head CT to exclude gross abnormality. Recheck UA and culture. 6. Possible right ventricular outflow tract obstruction/thrombus--Echo notable for possible RVOT thrombus--unable to get VQ scan secondary to patient's agitation. Creatinine now improved--check CTA 8/26 if creatinine stable and received no IV dye at [facility] 8/16. Continue full anticoagulation for now. LE dopplers negative. 7. UTI--treated with 8 days Rocephin, no culture on admission. Patient had already been given antibiotics, therefore later culture unreliable. Further plan as above. 8. Hypotension--resolved. Multifactorial--diuretic-inducted dehydraton coupled with anti-hypertenisves in setting of ARF. Echocardiogram suggest possible RVOT. 9. Dysphagia--Needs repeat speech evaluation when clinically  improved. 10. Elevated transaminases--No significant change. AP & bilirubin normal. Possibly secondary to Crestor. Recheck in AM. 11. Low albumin--nutrition consult. 12. Leukocytosis--slowly climbing. Reculture, check chest x-ray. Monitor, serial abdominal exams. If fever, start broad-spectrum antibiotics. 13. Normocytic anemia--stable. 14. SVT 8/21--no recurrence. Cardiac enzymes negative. Monitor. Related to RVOT obstruction? 15. AAA--Followed by Dr. Calla Kicks in 6 months. CT 8/14--stable 5.4 cm infrarenal aneurysm w/o leak or rupture  16. Afib?--unclear history, not on warfarin.  17. Depression with anxiety--continue BZD. 18. COPD--Stable currently  19. Chronic hip pain--by history too high risk for hip surgery. Morphine for pain control. 20. RBBB--chronic.  30 minute discussion with daughter last night by telephone outlining above.  Code Status: Full Family Communication: discussed with son at bedside today Disposition Plan: SNF  Brendia Sacks, MD  [facility] Team 8 Pager [phone]. If 8PM-8AM, please contact night-coverage at [email or web address], password [facility] 08/15/2012, 10:32 AM  LOS: 9 days   Brief narrative: 84 yow who was transferred from [facility] to hypotension, ABD pain, UTI along with a history of an AAA which is being followed by Dr, Waverly Ferrari. She lives at an Assisted Living facility [facility]) and has had complaints of Abd Pain, nausea and dry heaves X 3 days. She denied constipation, or diarrhea. She also denied having fevers or chills. She had worsening weakness. Admitted for hypotension, possible early sepsis. Seen by vascular surguery for history of AAA (stable) and signed off. Abdominal films revealed SBO versus ileus and patient was seen by general surgery. Noted to be confused 8/21 (day 6 of hospitalization)  Consultants:  General surgery  Vascular surgery--signed off  PT--SNF  OT--SNF  ST--thin liquids (patient refused other  consistencies)  Procedures:  8/20 2D echocardiogram--LVEF 60-65%, grade 1 diastolic dysfunction. Possible RVOT thrombus.  8/24 BLE venous doppler--No obvious evidence of DVT, superficial thrombosis, or Baker's Cyst. Technically difficult study due to patient cooperation.   Antibiotics:  Rocephin 8/17>>8/24  HPI/Subjective: AF, vitals  stable. Minimal oral intake. Remains confused, agitated at times. Objective: Filed Vitals:   08/14/12 0554 08/14/12 1700 08/14/12 2108 08/15/12 0522  BP: 132/85 130/76 118/66 109/64  Pulse: 80 76 70 78  Temp: 98.2 F (36.8 C) 98.4 F (36.9 C) 99.9 F (37.7 C) 98.1 F (36.7 C)  TempSrc: Oral Oral Oral Oral  Resp: 18 18 20 18   Height:      Weight:      SpO2: 96% 97% 95% 95%    Intake/Output Summary (Last 24 hours) at 08/15/12 1032 Last data filed at 08/15/12 0810  Gross per 24 hour  Intake 2794.68 ml  Output      0 ml  Net 2794.68 ml   Filed Weights   08/06/12 1949 08/13/12 1600  Weight: 85 kg (187 lb 6.3 oz) 87.5 kg (192 lb 14.4 oz)    Exam:   General:  Appears comfortable, mildly agitated, easily redirectable. Wearing soft mitts. Speech somewhat difficult to understand, but alert and oriented to self. Follows commands.  Eyes: PERRL, normal lids, irises, conjunctiva  ENT: grossly normal hearing, lips and tongue appear dry  Neck: no LAD, masses or thyromegaly  Cardiovascular: RRR, no m/r/g. No LE edema  Respiratory: CTA bilaterally, no w/r/r. Normal respiratory effort.  Abdomen: distended, soft, non-tender, increased bowel sounds  Skin: no rash or induration; non-tender  Musculoskeletal: moves all extremities   Neuro: grossly intact (follows commands)  Psychiatric: as above  Data Reviewed: Basic Metabolic Panel:  Lab 08/15/12 4098 08/14/12 0510 08/13/12 0520 08/12/12 0415 08/11/12 1922 08/10/12 0345  NA 142 147* 154* 156* 154* --  K 2.9* 2.5* 3.0* 2.9* 3.6 --  CL 107 111 118* 119* 116* --  CO2 25 24 24 22 22  --    GLUCOSE 132* 131* 159* 106* 104* --  BUN 36* 42* 58* 78* 84* --  CREATININE 1.02 1.13* 1.35* 1.74* 1.87* --  CALCIUM 8.8 9.1 9.5 9.5 9.6 --  MG 1.3* -- -- -- -- 2.4  PHOS -- -- -- -- -- --   Liver Function Tests:  Lab 08/15/12 0135 08/10/12 0345  AST 102* 186*  ALT 98* 84*  ALKPHOS 88 141*  BILITOT 0.6 0.5  PROT 5.8* 6.4  ALBUMIN 1.9* 1.9*   CBC:  Lab 08/15/12 0135 08/14/12 0510 08/13/12 0520 08/12/12 0415 08/11/12 2136  WBC 16.2* 14.7* 12.3* 8.7 8.4  NEUTROABS -- -- -- -- --  HGB 10.1* 10.6* 10.8* 11.3* 11.5*  HCT 31.2* 32.3* 32.9* 34.0* 34.1*  MCV 86.2 85.2 85.0 83.5 82.4  PLT 174 174 200 224 233    Basename 05/23/12 1326  PROBNP 484.0*    Recent Results (from the past 240 hour(s))  MRSA PCR SCREENING     Status: Normal   Collection Time   08/06/12 11:43 PM      Component Value Range Status Comment   MRSA by PCR NEGATIVE  NEGATIVE Final   URINE CULTURE     Status: Normal   Collection Time   08/07/12  5:13 PM      Component Value Range Status Comment   Specimen Description URINE, CLEAN CATCH   Final    Special Requests NONE   Final    Culture  Setup Time 08/08/2012 03:48   Final    Colony Count 10,000 COLONIES/ML   Final    Culture     Final    Value: Multiple bacterial morphotypes present, none predominant. Suggest appropriate recollection if clinically indicated.   Report Status 08/09/2012 FINAL  Final      Studies: Dg Abd 1 View  08/14/2012  *RADIOLOGY REPORT*  Clinical Data: Abdominal distention.  ABDOMEN - 1 VIEW  Comparison: 08/11/2012  Findings: Supine images of the abdomen demonstrate gaseous distention of the stomach.  There are still gas-filled loops of small bowel.  Overall, there is decreased small bowel distention. Again noted is sclerosis and abnormality of the femoral heads. There is high-density material contrast within the rectum.  The nasogastric tube has been removed.  Large gas-filled structure in the right lower quadrant is nonspecific and  could be associated with the cecum. Limited evaluation for free air on the supine images.  IMPRESSION: Decreased small bowel gaseous distention.  Slightly increased gaseous distention of the stomach since removal of the nasogastric tube.   Original Report Authenticated By: Richarda Overlie, M.D.    Dg Abd Portable 2v  08/15/2012  *RADIOLOGY REPORT*  Clinical Data: Evaluate small bowel obstruction or ileus.  PORTABLE ABDOMEN - 2 VIEW  Comparison: 08/14/2012  Findings: Supine and left lower decubitus images were obtained. There is no evidence to suggest free air.  There is gas within the right colon.  There is persistent gaseous distention of the stomach.  There are still dilated gas-filled loops of small bowel. The contrast in the rectum has apparently passed and there is now gas in the rectum.  Chronic changes involving both hips.  IMPRESSION: There is persistent gaseous distention of the stomach and bowel loops.  There appears to be increased gas within the colon. Findings are more suggestive for an ileus pattern rather than an obstruction at this time.   Original Report Authenticated By: Richarda Overlie, M.D.    Scheduled Meds:    . heparin  1,500 Units Intravenous Once  . heparin  2,000 Units Intravenous Once  . LORazepam  0.5 mg Intravenous TID  . magnesium sulfate 1 - 4 g bolus IVPB  2 g Intravenous Once  . pantoprazole  40 mg Oral Q1200  . potassium chloride  10 mEq Intravenous Q1 Hr x 4  . tiotropium  18 mcg Inhalation Daily  . DISCONTD: cefTRIAXone (ROCEPHIN)  IV  1 g Intravenous Q24H  . DISCONTD: LORazepam  0.5 mg Intravenous Q12H  . DISCONTD: QUEtiapine  25 mg Oral QHS   Continuous Infusions:    . dextrose 5 % and 0.45 % NaCl with KCl 20 mEq/L 75 mL/hr at 08/15/12 0600  . heparin 1,700 Units/hr (08/15/12 0312)  . DISCONTD: dextrose 75 mL/hr at 08/14/12 1608    Principal Problem:  *ARF on CKD 4 Active Problems:  Small bowel obstruction  Hypernatremia  ARTHRITIS, HIPS, BILATERAL  AAA  (abdominal aortic aneurysm)  COPD (chronic obstructive pulmonary disease)  Hypotension  Acute encephalopathy  Pulmonary embolism  UTI (lower urinary tract infection)  Dysphagia  Anemia  Depression     Brendia Sacks, MD  [facility] Team 8 Pager [phone]. If 8PM-8AM, please contact night-coverage at [email or web address], password [facility] 08/15/2012, 10:32 AM  LOS: 9 days   Time spent: 60 minutes

## 2012-08-15 NOTE — Progress Notes (Signed)
ANTICOAGULATION CONSULT NOTE - Follow Up Consult  Pharmacy Consult for Heparin Indication:  Possible large R-ventricular outflow obstruction/thrombus [pulmonary embolus]  Allergies Allergen  . Asa (Aspirin)  . Lidocaine  . Nsaids  . Sulfonamide Derivatives    Patient Measurements: Height: 5\' 4"  (162.6 cm) Weight: 192 lb 14.4 oz (87.5 kg) IBW/kg (Calculated) : 54.7  Heparin dosing weight 74.3 kg  Vital Signs: BP 118/66  Pulse 70  Temp 99.9 F (37.7 C) (Oral)  Resp 20  Ht 5\' 4"  (1.626 m)  Wt 192 lb 14.4 oz (87.5 kg)  BMI 33.11 kg/m2  SpO2 95%  Labs:  Basename 08/11/12 0505 08/10/12 0345 08/09/12 0530  HGB -- -- 11.6*  HCT -- -- 34.3*  PLT -- -- 220  APTT Pending -- --  LABPROT Pending -- --  INR Pending -- --  CREATININE 2.23* 3.43* 4.51*   Estimated Creatinine Clearance: 51.8 ml/min (by C-G formula based on Cr of 1.02).  Lab Results   Most recent  Component Value Date   INR 1.03 05/23/2012    Medical History: Past Medical History  Diagnosis Date  . Hypertension   . Hyperlipidemia   . COPD (chronic obstructive pulmonary disease)   . Depression with anxiety   . GERD (gastroesophageal reflux disease)   . Hiatal hernia   . Atrial fibrillation   . Productive cough   . Leg pain   . AAA (abdominal aortic aneurysm)   . CAD (coronary artery disease)   . Chronic kidney disease    Past Surgical History  Procedure Date  . Kidney stone surgery   . Cystectomy     Medications:  Prescriptions prior to admission  Medication Sig Dispense Refill  . acetaminophen (TYLENOL) 500 MG tablet Take 500 mg by mouth every 4 (four) hours as needed. For pain      . albuterol (PROVENTIL HFA;VENTOLIN HFA) 108 (90 BASE) MCG/ACT inhaler Inhale 2 puffs into the lungs every 4 (four) hours as needed. For wheezing      . ALPRAZolam (XANAX) 1 MG tablet Take 1 mg by mouth 3 (three) times daily.      . Alum & Mag Hydroxide-Simeth (ANTACID LIQUID PO) Take 30 mLs by mouth every 2 (two)  hours as needed. Heartburn      . amLODipine (NORVASC) 2.5 MG tablet Take 2.5 mg by mouth daily.      . Choline Fenofibrate (TRILIPIX) 135 MG capsule Take 135 mg by mouth daily.      . diclofenac sodium (VOLTAREN) 1 % GEL Apply 1 application topically 4 (four) times daily. To hip      . HYDROcodone-acetaminophen (NORCO) 10-325 MG per tablet Take 1 tablet by mouth 3 (three) times daily.      Marland Kitchen HYDROcodone-homatropine (HYCODAN) 5-1.5 MG/5ML syrup Take 5 mLs by mouth every 4 (four) hours as needed. For cough      . nebivolol (BYSTOLIC) 5 MG tablet Take 5 mg by mouth daily.      Marland Kitchen omega-3 acid ethyl esters (LOVAZA) 1 G capsule Take 1 g by mouth daily.       Marland Kitchen omeprazole (PRILOSEC) 20 MG capsule Take 20 mg by mouth daily.        . rosuvastatin (CRESTOR) 40 MG tablet Take 40 mg by mouth daily.        . simethicone (MYLICON) 80 MG chewable tablet Chew 80 mg by mouth 3 (three) times daily.       Marland Kitchen tiotropium (SPIRIVA HANDIHALER) 18 MCG inhalation capsule  Place 18 mcg into inhaler and inhale daily.        Marland Kitchen torsemide (DEMADEX) 20 MG tablet Take 20 mg by mouth 2 (two) times daily.      Marland Kitchen torsemide (DEMADEX) 20 MG tablet Take 20 mg by mouth daily as needed. For legs       Scheduled:    . cefTRIAXone (ROCEPHIN)  IV  1 g Intravenous Q24H  . LORazepam  0.5 mg Intravenous Q6H  . pantoprazole (PROTONIX) IV  40 mg Intravenous Q1200  . potassium chloride  10 mEq Intravenous Q1 Hr x 4  . potassium chloride  10 mEq Intravenous Q1 Hr x 5  . sodium chloride  500 mL Intravenous Once  . tiotropium  18 mcg Inhalation Daily    Anti-infectives     Start     Dose/Rate Route Frequency Ordered Stop   08/11/12 1200   cefTRIAXone (ROCEPHIN) 1 g in dextrose 5 % 50 mL IVPB        1 g 100 mL/hr over 30 Minutes Intravenous Every 24 hours 08/11/12 1137            Assessment:  77 y.o. Female who was transferred from [facility] in [place] to [facility] with Hypotension, ABD Pain, UTI, and with a history of an  AAA.   CT reports a possible large R-ventricular outflow obstruction/thrombus.  Empiric full dose Heparin ordered to cover for thrombosis.   Heparin level (0.21) is below-goal on 1550 units/hr. RN reports that heparin was off for part the of the time between 2300-2359 due to infiltration. No issues with heparin infusion since midnight.   Goal of Therapy:   Heparin level 0.3-0.7 units/ml   Plan:  1. Heparin 1500 unit IV bolus x 1, then increase IV infusion to 1700 units/hr.  2. Heparin level in 8 hours.   Lorre Munroe, PharmD 08/15/2012 2:54 AM

## 2012-08-15 NOTE — Progress Notes (Signed)
Placed 16 french NG tube in left nare, positive air bolus heard, NG tube clamped.  Patient tolerated well .  Primary RN informed

## 2012-08-15 NOTE — Progress Notes (Signed)
PARENTERAL NUTRITION CONSULT NOTE - INITIAL  Pharmacy Consult for TPN Indication:Prolonged ileus   Allergies  Allergen Reactions  . Asa (Aspirin) Other (See Comments)    "my ears bleed"  . Lidocaine Other (See Comments)    unknown  . Nsaids Other (See Comments)    unknown  . Sulfonamide Derivatives Other (See Comments)    unknown    Patient Measurements: Height: 5\' 4"  (162.6 cm) Weight: 192 lb 14.4 oz (87.5 kg) IBW/kg (Calculated) : 54.7  Adjusted Body Weight: 60Kg Usual Weight: 199 lb  Vital Signs: Temp: 98.1 F (36.7 C) (08/25 0522) Temp src: Oral (08/25 0522) BP: 109/64 mmHg (08/25 0522) Pulse Rate: 78  (08/25 0522) Intake/Output from previous day: 08/24 0701 - 08/25 0700 In: 2560.6 [P.O.:240; I.V.:1870.6; IV Piggyback:450] Out: -  Intake/Output from this shift: Total I/O In: 394.1 [I.V.:394.1] Out: -   Labs:  Basename 08/15/12 0135 08/14/12 0510 08/13/12 0520  WBC 16.2* 14.7* 12.3*  HGB 10.1* 10.6* 10.8*  HCT 31.2* 32.3* 32.9*  PLT 174 174 200  APTT -- -- --  INR -- -- --     Basename 08/15/12 0135 08/14/12 0510 08/13/12 0520  NA 142 147* 154*  K 2.9* 2.5* 3.0*  CL 107 111 118*  CO2 25 24 24   GLUCOSE 132* 131* 159*  BUN 36* 42* 58*  CREATININE 1.02 1.13* 1.35*  LABCREA -- -- --  CREAT24HRUR -- -- --  CALCIUM 8.8 9.1 9.5  MG 1.3* -- --  PHOS -- -- --  PROT 5.8* -- --  ALBUMIN 1.9* -- --  AST 102* -- --  ALT 98* -- --  ALKPHOS 88 -- --  BILITOT 0.6 -- --  BILIDIR -- -- --  IBILI -- -- --  PREALBUMIN -- -- --  TRIG -- -- --  CHOLHDL -- -- --  CHOL -- -- --   Estimated Creatinine Clearance: 51.8 ml/min (by C-G formula based on Cr of 1.02).   No results found for this basename: GLUCAP:3 in the last 72 hours  Medical History: Past Medical History  Diagnosis Date  . Hypertension   . Hyperlipidemia   . COPD (chronic obstructive pulmonary disease)   . Arthritis   . Depression with anxiety   . GERD (gastroesophageal reflux disease)     . Hiatal hernia   . Atrial fibrillation   . Productive cough   . Leg pain   . AAA (abdominal aortic aneurysm)   . CAD (coronary artery disease)   . Chronic kidney disease     Medications:  Prescriptions prior to admission  Medication Sig Dispense Refill  . acetaminophen (TYLENOL) 500 MG tablet Take 500 mg by mouth every 4 (four) hours as needed. For pain      . albuterol (PROVENTIL HFA;VENTOLIN HFA) 108 (90 BASE) MCG/ACT inhaler Inhale 2 puffs into the lungs every 4 (four) hours as needed. For wheezing      . ALPRAZolam (XANAX) 1 MG tablet Take 1 mg by mouth 3 (three) times daily.      . Alum & Mag Hydroxide-Simeth (ANTACID LIQUID PO) Take 30 mLs by mouth every 2 (two) hours as needed. Heartburn      . amLODipine (NORVASC) 2.5 MG tablet Take 2.5 mg by mouth daily.      . Choline Fenofibrate (TRILIPIX) 135 MG capsule Take 135 mg by mouth daily.      . diclofenac sodium (VOLTAREN) 1 % GEL Apply 1 application topically 4 (four) times daily. To hip      .  HYDROcodone-acetaminophen (NORCO) 10-325 MG per tablet Take 1 tablet by mouth 3 (three) times daily.      Marland Kitchen HYDROcodone-homatropine (HYCODAN) 5-1.5 MG/5ML syrup Take 5 mLs by mouth every 4 (four) hours as needed. For cough      . nebivolol (BYSTOLIC) 5 MG tablet Take 5 mg by mouth daily.      Marland Kitchen omega-3 acid ethyl esters (LOVAZA) 1 G capsule Take 1 g by mouth daily.       Marland Kitchen omeprazole (PRILOSEC) 20 MG capsule Take 20 mg by mouth daily.        . rosuvastatin (CRESTOR) 40 MG tablet Take 40 mg by mouth daily.        . simethicone (MYLICON) 80 MG chewable tablet Chew 80 mg by mouth 3 (three) times daily.       Marland Kitchen tiotropium (SPIRIVA HANDIHALER) 18 MCG inhalation capsule Place 18 mcg into inhaler and inhale daily.        Marland Kitchen torsemide (DEMADEX) 20 MG tablet Take 20 mg by mouth 2 (two) times daily.      Marland Kitchen torsemide (DEMADEX) 20 MG tablet Take 20 mg by mouth daily as needed. For legs        Insulin Requirements in the past 24 hours:  None- no SSI  on board  Current Nutrition:  MIVF= D5.45NS w/20K (provides KCl per day) Clear liquid diet  Nutritional Goals per RD:  1500-1700 kCal, 80-90 grams of protein per day  Assessment: [name] [name] was admitted 8/16 from [facility] d/t low BP, abd pain and UTI. She has a known AAA for which there are no surgical plans presently.   GI: Pt has been treated with bowel rest, bowel regimen, NGT and  reglan since admit. Noted she was tolerating sips/chips then clears without n/v and began to have BM, so her NGT was d/c. Now with rpt emesis. She has essentially not had any substantial intake since at least 8/16. No BM reported since 8/21. Noted KUB today still c/w ileus. She is at a high refeeding risk. Endo: No prior hx of DM, glucose on BMET has been ok. TSH this admit was WNL. Lytes: Noted low K and Mg. Noted 2gm of IV Mg ordered by MD. Goal with ileus is ~4 for K and ~2 for Mg to help with GI peristalsis. K has been low for a while now. Renal: Scr is normal, UOP not being measured. Pulm: RA Cards: VSS Hepatobil: LFTs are 2-3 x ULN, Tbil WNL. Alb low at 1.9 Neuro: Confused per MD assessment 8/24, GCS reportedly 13 today ID: Pt afebrile, WBC is rising- no abx on board. (s/p Rocephin x 7 days) Best Practices: IV hep for PE, PO PPI Central access: PICC pending  Plan:  - Once PICC is placed, will start Clinimix E 5/15 at 40cc/hr- will advance to eventual goal of 75cc/hr based on patient tolerance. - Will provide select trave elements, MVI and IV fats (at 10cc/hr) only d/t Sport and exercise psychologist. - Will give 6 runs of IV KCL today, will continue MIVF with KCL - Will give 2gm IV Mg in addition to MD order for a total of 4gm today - Will start SSI, sensitive, q4h at 1800 today-  Will d/c if deem unnecessary based on CBG control. - Will f/up TPN labs in AM.  Thanks, Coleby Yett K. Allena Katz, PharmD, BCPS.  Clinical Pharmacist Pager [phone]. 08/15/2012 11:29 AM

## 2012-08-15 NOTE — Progress Notes (Signed)
ANTICOAGULATION CONSULT NOTE - Follow Up Consult  Pharmacy Consult for Heparin Indication:  Possible large R-ventricular outflow obstruction/thrombus [pulmonary embolus]  Allergies Allergen  . Asa (Aspirin)  . Lidocaine  . Nsaids  . Sulfonamide Derivatives    Patient Measurements: Height: 5\' 4"  (162.6 cm) Weight: 192 lb 14.4 oz (87.5 kg) IBW/kg (Calculated) : 54.7  Heparin dosing weight 74.3 kg  Vital Signs: BP 109/64  Pulse 78  Temp 98.1 F (36.7 C) (Oral)  Resp 18  Ht 5\' 4"  (1.626 m)  Wt 192 lb 14.4 oz (87.5 kg)  BMI 33.11 kg/m2  SpO2 95%  Labs:  Basename 08/11/12 0505 08/10/12 0345 08/09/12 0530  HGB -- -- 11.6*  HCT -- -- 34.3*  PLT -- -- 220  APTT Pending -- --  LABPROT Pending -- --  INR Pending -- --  CREATININE 2.23* 3.43* 4.51*   Estimated Creatinine Clearance: 51.8 ml/min (by C-G formula based on Cr of 1.02).  Lab Results   Most recent  Component Value Date   INR 1.03 05/23/2012    Medical History: Past Medical History  Diagnosis Date  . Hypertension   . Hyperlipidemia   . COPD (chronic obstructive pulmonary disease)   . Depression with anxiety   . GERD (gastroesophageal reflux disease)   . Hiatal hernia   . Atrial fibrillation   . Productive cough   . Leg pain   . AAA (abdominal aortic aneurysm)   . CAD (coronary artery disease)   . Chronic kidney disease    Past Surgical History  Procedure Date  . Kidney stone surgery   . Cystectomy     Medications:  Prescriptions prior to admission  Medication Sig Dispense Refill  . acetaminophen (TYLENOL) 500 MG tablet Take 500 mg by mouth every 4 (four) hours as needed. For pain      . albuterol (PROVENTIL HFA;VENTOLIN HFA) 108 (90 BASE) MCG/ACT inhaler Inhale 2 puffs into the lungs every 4 (four) hours as needed. For wheezing      . ALPRAZolam (XANAX) 1 MG tablet Take 1 mg by mouth 3 (three) times daily.      . Alum & Mag Hydroxide-Simeth (ANTACID LIQUID PO) Take 30 mLs by mouth every 2 (two)  hours as needed. Heartburn      . amLODipine (NORVASC) 2.5 MG tablet Take 2.5 mg by mouth daily.      . Choline Fenofibrate (TRILIPIX) 135 MG capsule Take 135 mg by mouth daily.      . diclofenac sodium (VOLTAREN) 1 % GEL Apply 1 application topically 4 (four) times daily. To hip      . HYDROcodone-acetaminophen (NORCO) 10-325 MG per tablet Take 1 tablet by mouth 3 (three) times daily.      Marland Kitchen HYDROcodone-homatropine (HYCODAN) 5-1.5 MG/5ML syrup Take 5 mLs by mouth every 4 (four) hours as needed. For cough      . nebivolol (BYSTOLIC) 5 MG tablet Take 5 mg by mouth daily.      Marland Kitchen omega-3 acid ethyl esters (LOVAZA) 1 G capsule Take 1 g by mouth daily.       Marland Kitchen omeprazole (PRILOSEC) 20 MG capsule Take 20 mg by mouth daily.        . rosuvastatin (CRESTOR) 40 MG tablet Take 40 mg by mouth daily.        . simethicone (MYLICON) 80 MG chewable tablet Chew 80 mg by mouth 3 (three) times daily.       Marland Kitchen tiotropium (SPIRIVA HANDIHALER) 18 MCG inhalation capsule  Place 18 mcg into inhaler and inhale daily.        Marland Kitchen torsemide (DEMADEX) 20 MG tablet Take 20 mg by mouth 2 (two) times daily.      Marland Kitchen torsemide (DEMADEX) 20 MG tablet Take 20 mg by mouth daily as needed. For legs       Scheduled:    . cefTRIAXone (ROCEPHIN)  IV  1 g Intravenous Q24H  . LORazepam  0.5 mg Intravenous Q6H  . pantoprazole (PROTONIX) IV  40 mg Intravenous Q1200  . potassium chloride  10 mEq Intravenous Q1 Hr x 4  . potassium chloride  10 mEq Intravenous Q1 Hr x 5  . sodium chloride  500 mL Intravenous Once  . tiotropium  18 mcg Inhalation Daily    Anti-infectives     Start     Dose/Rate Route Frequency Ordered Stop   08/11/12 1200   cefTRIAXone (ROCEPHIN) 1 g in dextrose 5 % 50 mL IVPB        1 g 100 mL/hr over 30 Minutes Intravenous Every 24 hours 08/11/12 1137            Assessment:  77 y.o. Female who was transferred from [facility] in [place] to [facility] with Hypotension, ABD Pain, UTI, and with a history of an  AAA.   CT reports a possible large R-ventricular outflow obstruction/thrombus.  Empiric full dose Heparin ordered to cover for thrombosis.   Heparin level therapeutic at 0.48 today.   Goal of Therapy:   Heparin level 0.3-0.7 units/ml   Plan:  1. Continue heparin at 1700 units/hr 2. Daily heparin level and CBC   Doris Cheadle, PharmD Clinical Pharmacist Pager: [phone] Phone: [phone] 08/15/2012 11:34 AM

## 2012-08-15 NOTE — Progress Notes (Signed)
[facility]/[name] [name] Progress Note    Subjective: Patient asleep but vomited several times in the past 24 hours  Objective: Vital signs in last 24 hours: Temp:  [98.1 F (36.7 C)-99.9 F (37.7 C)] 98.1 F (36.7 C) (08/25 0522) Pulse Rate:  [70-78] 78  (08/25 0522) Resp:  [18-20] 18  (08/25 0522) BP: (109-130)/(64-76) 109/64 mmHg (08/25 0522) SpO2:  [95 %-97 %] 95 % (08/25 0522) Last BM Date: 08/14/12  Intake/Output from previous day: 08/24 0701 - 08/25 0700 In: 2560.6 [P.O.:240; I.V.:1870.6; IV Piggyback:450] Out: -  Intake/Output this shift: Total I/O In: 394.1 [I.V.:394.1] Out: -   General: Asleep, no acute distress  Lungs: Clear  Abd: Distended, hypoactive bowel sounds.  Extremities: No change  Neuro: Sleepy but has mittens on as if agitated  Lab Results:  @LABLAST2 (wbc:2,hgb:2,hct:2,plt:2) BMET  Basename 08/15/12 0135 08/14/12 0510  NA 142 147*  K 2.9* 2.5*  CL 107 111  CO2 25 24  GLUCOSE 132* 131*  BUN 36* 42*  CREATININE 1.02 1.13*  CALCIUM 8.8 9.1   PT/INR No results found for this basename: LABPROT:2,INR:2 in the last 72 hours ABG No results found for this basename: PHART:2,PCO2:2,PO2:2,HCO3:2 in the last 72 hours  Studies/Results: Dg Abd 1 View  08/14/2012  *RADIOLOGY REPORT*  Clinical Data: Abdominal distention.  ABDOMEN - 1 VIEW  Comparison: 08/11/2012  Findings: Supine images of the abdomen demonstrate gaseous distention of the stomach.  There are still gas-filled loops of small bowel.  Overall, there is decreased small bowel distention. Again noted is sclerosis and abnormality of the femoral heads. There is high-density material contrast within the rectum.  The nasogastric tube has been removed.  Large gas-filled structure in the right lower quadrant is nonspecific and could be associated with the cecum. Limited evaluation for free air on the supine images.  IMPRESSION: Decreased small bowel gaseous distention.  Slightly increased gaseous distention of the  stomach since removal of the nasogastric tube.   Original Report Authenticated By: Richarda Overlie, M.D.    Dg Abd Portable 2v  08/15/2012  *RADIOLOGY REPORT*  Clinical Data: Evaluate small bowel obstruction or ileus.  PORTABLE ABDOMEN - 2 VIEW  Comparison: 08/14/2012  Findings: Supine and left lower decubitus images were obtained. There is no evidence to suggest free air.  There is gas within the right colon.  There is persistent gaseous distention of the stomach.  There are still dilated gas-filled loops of small bowel. The contrast in the rectum has apparently passed and there is now gas in the rectum.  Chronic changes involving both hips.  IMPRESSION: There is persistent gaseous distention of the stomach and bowel loops.  There appears to be increased gas within the colon. Findings are more suggestive for an ileus pattern rather than an obstruction at this time.   Original Report Authenticated By: Richarda Overlie, M.D.     Anti-infectives: Anti-infectives     Start     Dose/Rate Route Frequency Ordered Stop   08/11/12 1200   cefTRIAXone (ROCEPHIN) 1 g in dextrose 5 % 50 mL IVPB  Status:  Discontinued        1 g 100 mL/hr over 30 Minutes Intravenous Every 24 hours 08/11/12 1137 08/14/12 1351   08/07/12 0930   cefTRIAXone (ROCEPHIN) 1 g in dextrose 5 % 50 mL IVPB  Status:  Discontinued        1 g 100 mL/hr over 30 Minutes Intravenous Every 24 hours 08/07/12 0845 08/10/12 2310  Assessment/Plan: s/p  Not progressing from overal SBO standpointWill need NGT again. Family wants to avoid surgery if possible. NPO Hypokalemia May need parenteral nutrition.  LOS: 9 days   Marta Lamas. Gae Bon, MD, FACS [phone] [facility] 08/15/2012

## 2012-08-15 NOTE — Progress Notes (Signed)
[facility] notified that Pt is vomiting [name] [name] colored emesis was updated that pt is scheduled for abd. Portable xray in am new orders received will continue to monitor

## 2012-08-16 ENCOUNTER — Inpatient Hospital Stay (HOSPITAL_COMMUNITY): Payer: Medicare Other

## 2012-08-16 LAB — GLUCOSE, CAPILLARY
Glucose-Capillary: 147 mg/dL — ABNORMAL HIGH (ref 70–99)
Glucose-Capillary: 116 mg/dL — ABNORMAL HIGH (ref 70–99)

## 2012-08-16 LAB — COMPREHENSIVE METABOLIC PANEL
Alkaline Phosphatase: 86 U/L (ref 39–117)
BUN: 33 mg/dL — ABNORMAL HIGH (ref 6–23)
Chloride: 111 mEq/L (ref 96–112)
GFR calc Af Amer: 62 mL/min — ABNORMAL LOW (ref >90)
Glucose, Bld: 151 mg/dL — ABNORMAL HIGH (ref 70–99)
Potassium: 4 mEq/L (ref 3.5–5.1)
Total Bilirubin: 0.5 mg/dL (ref 0.3–1.2)
Total Protein: 5.1 g/dL — ABNORMAL LOW (ref 6.0–8.3)

## 2012-08-16 LAB — DIFFERENTIAL
Basophils Absolute: 0.1 10*3/uL (ref 0.0–0.1)
Basophils Relative: 0 % (ref 0–1)
Eosinophils Absolute: 0.1 10*3/uL (ref 0.0–0.7)
Eosinophils Relative: 1 % (ref 0–5)

## 2012-08-16 LAB — PREALBUMIN: Prealbumin: 7.3 mg/dL — ABNORMAL LOW (ref 17.0–34.0)

## 2012-08-16 LAB — CBC
MCH: 28.1 pg (ref 26.0–34.0)
MCV: 87.8 fL (ref 78.0–100.0)
Platelets: 185 10*3/uL (ref 150–400)
RBC: 3.27 MIL/uL — ABNORMAL LOW (ref 3.87–5.11)

## 2012-08-16 MED ORDER — ZINC TRACE METAL 1 MG/ML IV SOLN
INTRAVENOUS | Status: AC
  Administered 2012-08-16: 18:00:00 via INTRAVENOUS
  Filled 2012-08-16: qty 1000

## 2012-08-16 MED ORDER — SODIUM GLYCEROPHOSPHATE 1 MMOLE/ML IV SOLN
20.0000 mmol | Freq: Once | INTRAVENOUS | Status: AC
  Administered 2012-08-16: 20 mmol via INTRAVENOUS
  Filled 2012-08-16: qty 20

## 2012-08-16 MED ORDER — FAT EMULSION 20 % IV EMUL
240.0000 mL | INTRAVENOUS | Status: AC
  Administered 2012-08-16: 240 mL via INTRAVENOUS
  Filled 2012-08-16: qty 250

## 2012-08-16 NOTE — Progress Notes (Signed)
Physical Therapy Treatment Patient Details Name: [name] [name] MRN: [id number] DOB: 1935/08/23 Today's Date: 08/16/2012 Time: 2956-2130 PT Time Calculation (min): 18 min  PT Assessment / Plan / Recommendation Comments on Treatment Session  Pt admitted with Abd pain, weakness, encephalopathy and ileus. Pt with improved mentation but still limited and improved mobility but transfers still require +2 assist for safety and assist with recommendation for use of lift equipment OOB. Will continue to follow to maximize strength, transfers and mobility.    Follow Up Recommendations  Skilled nursing facility    Barriers to Discharge        Equipment Recommendations       Recommendations for Other Services    Frequency     Plan Discharge plan remains appropriate;Frequency remains appropriate    Precautions / Restrictions Precautions Precautions: Fall   Pertinent Vitals/Pain No pain reported    Mobility  Bed Mobility Bed Mobility: Right Sidelying to Sit Rolling Right: 4: Min assist Right Sidelying to Sit: 4: Min assist;With rails;HOB elevated (HOB 20 degrees) Details for Bed Mobility Assistance: cueing for sequence and safety with assist to elevate trunk from surface and use of rail for mobility Transfers Transfers: Sit to Stand;Stand to Sit;Squat Pivot Transfers Sit to Stand: 1: +2 Total assist;From bed Stand to Sit: 1: +2 Total assist;To bed Stand to Sit: Patient Percentage: 30% Stand Pivot Transfers: Patient Percentage: 30% Squat Pivot Transfers: 1: +2 Total assist Squat Pivot Transfers: Patient Percentage: 30% Details for Transfer Assistance: pt with max cueing for anterior translation, foot position, safety and sequence with assist for tucking sacrum to stand. Pt with inability to achieve full extension of knees and trunk in standing and first trial sat prematurely at bed before attempting to extend hips. Second trial pt pivoted pelvis to chair but again sat prematurely with left  glut not fully on surface and +2 assist with increased time and difficulty to reciprocally scoot back in charir and position pt. Pt with improved bed mobility but very limited ability with standing  Ambulation/Gait Ambulation/Gait Assistance: Not tested (comment)    Exercises     PT Diagnosis:    PT Problem List:   PT Treatment Interventions:     PT Goals Acute Rehab PT Goals PT Goal: Sit to Stand - Progress: Progressing toward goal PT Transfer Goal: Bed to Chair/Chair to Bed - Progress: Progressing toward goal Additional Goals PT Goal: Additional Goal #1 - Progress: Progressing toward goal  Visit Information  Last PT Received On: 08/16/12 Assistance Needed: +2    Subjective Data  Subjective: I'm not messing with my nose   Cognition  Overall Cognitive Status: Impaired Area of Impairment: Following commands;Memory;Problem solving Arousal/Alertness: Awake/alert Orientation Level: Disoriented to;Time Behavior During Session: [facility] for tasks performed Current Attention Level: Sustained Following Commands: Follows one step commands inconsistently Safety/Judgement: Decreased awareness of need for assistance Problem Solving: pt unable to problem solve with mobility    Balance  Static Sitting Balance Static Sitting - Balance Support: Bilateral upper extremity supported;Feet supported Static Sitting - Level of Assistance: 5: Stand by assistance Static Sitting - Comment/# of Minutes: 5  End of Session PT - End of Session Equipment Utilized During Treatment: Gait belt Activity Tolerance: Patient tolerated treatment well Patient left: in chair;with call bell/phone within reach Nurse Communication: Mobility status;Need for lift equipment (recommended maximove back to bed to prevent fall or injury)   GP     Delorse Lek 08/16/2012, 3:20 PM Delaney Meigs, PT [phone]

## 2012-08-16 NOTE — Progress Notes (Signed)
ANTICOAGULATION CONSULT NOTE - Follow Up Consult  Pharmacy Consult for Heparin Indication: Possible right ventricular outflow tract obstruction/thrombus  Allergies  Allergen Reactions  . Asa (Aspirin) Other (See Comments)    "my ears bleed"  . Lidocaine Other (See Comments)    unknown  . Nsaids Other (See Comments)    unknown  . Sulfonamide Derivatives Other (See Comments)    unknown    Labs:  Basename 08/16/12 1928 08/16/12 0938 08/15/12 1040 08/15/12 0135 08/14/12 0510  HGB -- 9.2* -- 10.1* --  HCT -- 28.7* -- 31.2* 32.3*  PLT -- 185 -- 174 174  APTT -- -- -- -- --  LABPROT -- -- -- -- --  INR -- -- -- -- --  HEPARINUNFRC 0.50 0.24* 0.48 -- --  CREATININE -- 1.01 -- 1.02 1.13*  CKTOTAL -- -- -- -- --  CKMB -- -- -- -- --  TROPONINI -- -- -- -- --    Estimated Creatinine Clearance: 52.3 ml/min (by C-G formula based on Cr of 1.01).   Medications:  Infusions:     . fat emulsion 240 mL (08/16/12 1818)  . heparin 1,950 Units/hr (08/16/12 1457)  . TPN (CLINIMIX) +/- additives 40 mL/hr at 08/15/12 1731  . TPN (CLINIMIX) +/- additives 40 mL/hr at 08/16/12 1817  . DISCONTD: dextrose 5 % and 0.45 % NaCl with KCl 20 mEq/L 75 mL/hr at 08/16/12 1455    Assessment: 77 yo female on heparin for possible RVOT thrombus.   Heparin level now therapeutic  Goal of Therapy:  Heparin level 0.3-0.7 units/ml Monitor platelets by anticoagulation protocol: Yes   Plan:  -Continue heparin drip at 1950 units/hr -Daily heparin level and CBC while on heparin  Thank you. Okey Regal, PharmD [phone]  08/16/2012 8:28 PM

## 2012-08-16 NOTE — Progress Notes (Signed)
Abdomen soft; non-tender BM today AAS - still with some distended small bowel  Continue NG tube today, repeat films tomorrow. If she remains distended, SBFT tomorrow.  Wilmon Arms. Corliss Skains, MD, [facility] Surgery  08/16/2012 8:42 AM

## 2012-08-16 NOTE — Progress Notes (Signed)
[facility] PROGRESS NOTE  [name] [name] ION:[id number] DOB: 1935-11-25 DOA: 08/06/2012 PCP: Fredirick Maudlin, MD  Assessment/Plan: 1. Ileus--AXR with some improvement. Management per surgery. TNA. 2. Acute renal failure-Resolved. Suspect secondary to diuretics (BUN/creatinine pattern prerenal), possible component of Voltaren gel. CT abd/pelvis on 8/14 done without IV contrast (oral only) secondary to high creatinine. Question whether contrast was used at [facility] ED. 3. Hypernatremia--Resolved, likely secondary to IVF in setting of minimal PO intake (hyperchloremia noted as well).  4. Hypokalemia--replete. Magnesium repleted. Replete phosphorus. Check magnesium and phosphorus in AM. 5. Acute encephalopathy--Improving with resumption of BZD. Suspected narcotic & BZD withdrawal. Developed suddenly 8/21 (all previous notes report alert, normal mental status). Of note, ipatient was on scheduled Lortab TID and Xanax TID as outpatient (Xanax for 20 years per family), neither continued on admission. No listed history of dementia, not on any medications for dementia or mood disorder as an outpatient. Exam non-focal, infectious workup negative. Head CT negative. Recheck UA and culture. 6. Possible right ventricular outflow tract obstruction/thrombus--Echo notable for possible RVOT thrombus--unable to get VQ scan secondary to patient's agitation. Creatinine now improved--check CTA 8/27 if creatinine stable and received no IV dye at [facility] 8/16. Continue full anticoagulation for now. LE dopplers negative. 7. UTI--treated with 8 days Rocephin, no culture on admission. Patient had already been given antibiotics, therefore later culture unreliable. Further plan as above. 8. Hypotension--resolved. Multifactorial--diuretic-inducted dehydraton coupled with anti-hypertenisves in setting of ARF. Echocardiogram suggest possible RVOT. 9. Dysphagia--Needs repeat speech evaluation when clinically improved. TNA for  now. 10. Elevated transaminases--Mild improvement. AP & bilirubin normal. Possibly secondary to Crestor. Recheck in AM. 11. Low albumin--nutrition consult. 12. Leukocytosis--down today. Monitor, serial abdominal exams. If fever, start broad-spectrum antibiotics. 13. Anemia of acute illness--Slowly trending downwards, follow with daily CBC. 14. SVT 8/21--no recurrence. Cardiac enzymes negative. Monitor. Related to RVOT obstruction? 15. AAA--Followed by Dr. Calla Kicks in 6 months. CT 8/14--stable 5.4 cm infrarenal aneurysm w/o leak or rupture  16. Afib?--unclear history, not on warfarin.  17. Depression with anxiety--continue BZD. 18. COPD--Stable currently  19. Chronic hip pain--by history too high risk for hip surgery. Morphine for pain control. 20. RBBB--chronic.  Code Status: Full Family Communication:  Disposition Plan: SNF  Brendia Sacks, MD  [facility] Team 8 Pager [phone]. If 8PM-8AM, please contact night-coverage at [email or web address], password [facility] 08/16/2012, 6:12 PM  LOS: 10 days   Brief narrative: 82 yow who was transferred from [facility] to hypotension, ABD pain, UTI along with a history of an AAA which is being followed by Dr, Waverly Ferrari. She lives at an Assisted Living facility [facility]) and has had complaints of Abd Pain, nausea and dry heaves X 3 days. She denied constipation, or diarrhea. She also denied having fevers or chills. She had worsening weakness. Admitted for hypotension, possible early sepsis. Seen by vascular surguery for history of AAA (stable) and signed off. Abdominal films revealed SBO versus ileus and patient was seen by general surgery. Noted to be confused 8/21 (day 6 of hospitalization)  Consultants:  General surgery  Vascular surgery--signed off  PT--SNF  OT--SNF  ST--thin liquids (patient refused other consistencies)  Procedures:  8/20 2D echocardiogram--LVEF 60-65%, grade 1 diastolic dysfunction. Possible  RVOT thrombus.  8/24 BLE venous doppler--No obvious evidence of DVT, superficial thrombosis, or Baker's Cyst. Technically difficult study due to patient cooperation.   Antibiotics:  Rocephin 8/17>>8/24  HPI/Subjective: Vomiting recurred, NGT replaced. Better this AM, no pain. Discussed with RN--no concerns.  Objective: Filed Vitals:  08/15/12 1405 08/15/12 2316 08/16/12 0718 08/16/12 1434  BP: 110/66 103/87 100/56 97/51  Pulse: 70 81 71 75  Temp: 98.2 F (36.8 C) 100.1 F (37.8 C) 99.3 F (37.4 C) 98.2 F (36.8 C)  TempSrc: Oral Oral Oral Oral  Resp: 18 18 18 18   Height:      Weight:      SpO2: 96% 97% 97% 99%    Intake/Output Summary (Last 24 hours) at 08/16/12 1812 Last data filed at 08/15/12 1900  Gross per 24 hour  Intake   1056 ml  Output    300 ml  Net    756 ml   Filed Weights   08/06/12 1949 08/13/12 1600  Weight: 85 kg (187 lb 6.3 oz) 87.5 kg (192 lb 14.4 oz)    Exam:   General:  Appears comfortable, calm. Wearing soft mitts. Speech clearer, alert and oriented to self, hospital, month. Follows commands.  Eyes: PERRL, normal lids, irises, conjunctiva  ENT: grossly normal hearing, lips and tongue appear dry  Cardiovascular: RRR, no m/r/g. No LE edema  Respiratory: CTA bilaterally, no w/r/r. Normal respiratory effort.  Abdomen: distended, soft, non-tender, increased bowel sounds  Skin: no rash or induration; non-tender  Musculoskeletal: moves all extremities   Neuro: grossly intact (follows commands)  Psychiatric: as above  Data Reviewed: Basic Metabolic Panel:  Lab 08/16/12 1610 08/15/12 0135 08/14/12 0510 08/13/12 0520 08/12/12 0415 08/10/12 0345  NA 142 142 147* 154* 156* --  K 4.0 2.9* 2.5* 3.0* 2.9* --  CL 111 107 111 118* 119* --  CO2 24 25 24 24 22  --  GLUCOSE 151* 132* 131* 159* 106* --  BUN 33* 36* 42* 58* 78* --  CREATININE 1.01 1.02 1.13* 1.35* 1.74* --  CALCIUM 8.4 8.8 9.1 9.5 9.5 --  MG 2.2 1.3* -- -- -- 2.4  PHOS 1.1* --  -- -- -- --   Liver Function Tests:  Lab 08/16/12 0938 08/15/12 0135 08/10/12 0345  AST 75* 102* 186*  ALT 82* 98* 84*  ALKPHOS 86 88 141*  BILITOT 0.5 0.6 0.5  PROT 5.1* 5.8* 6.4  ALBUMIN 1.7* 1.9* 1.9*   CBC:  Lab 08/16/12 0938 08/15/12 0135 08/14/12 0510 08/13/12 0520 08/12/12 0415  WBC 14.7* 16.2* 14.7* 12.3* 8.7  NEUTROABS 11.2* -- -- -- --  HGB 9.2* 10.1* 10.6* 10.8* 11.3*  HCT 28.7* 31.2* 32.3* 32.9* 34.0*  MCV 87.8 86.2 85.2 85.0 83.5  PLT 185 174 174 200 224    Basename 05/23/12 1326  PROBNP 484.0*    Recent Results (from the past 240 hour(s))  MRSA PCR SCREENING     Status: Normal   Collection Time   08/06/12 11:43 PM      Component Value Range Status Comment   MRSA by PCR NEGATIVE  NEGATIVE Final   URINE CULTURE     Status: Normal   Collection Time   08/07/12  5:13 PM      Component Value Range Status Comment   Specimen Description URINE, CLEAN CATCH   Final    Special Requests NONE   Final    Culture  Setup Time 08/08/2012 03:48   Final    Colony Count 10,000 COLONIES/ML   Final    Culture     Final    Value: Multiple bacterial morphotypes present, none predominant. Suggest appropriate recollection if clinically indicated.   Report Status 08/09/2012 FINAL   Final      Studies: Ct Head Wo Contrast  08/15/2012  *  RADIOLOGY REPORT*  Clinical Data: Acute encephalopathy  CT HEAD WITHOUT CONTRAST  Technique:  Contiguous axial images were obtained from the base of the skull through the vertex without contrast.  Comparison: 09/11/2008  Findings: Motion degraded images.  No evidence of parenchymal hemorrhage or extra-axial fluid collection. No mass lesion, mass effect, or midline shift.  No CT evidence of acute infarction.  Subcortical white matter and periventricular small vessel ischemic changes.  Intracranial atherosclerosis.  Global cortical atrophy.  No ventriculomegaly.  The visualized paranasal sinuses are essentially clear. The mastoid air cells are  unopacified.  No evidence of calvarial fracture.  IMPRESSION: No evidence of acute intracranial abnormality.  Atrophy with small vessel ischemic changes and intracranial atherosclerosis.   Original Report Authenticated By: Charline Bills, M.D.    Dg Abd Portable 2v  08/16/2012  *RADIOLOGY REPORT*  Clinical Data: Abdominal pain.  Follow up ileus.  PORTABLE ABDOMEN - 2 VIEW  Comparison: 08/15/2012.  Findings: Nasogastric tube tip is at the gastroesophageal junction. Stomach is decompressed when compared with yesterday's exam.  There is persistent dilatation of bowel in the low central abdomen.  No free air.  Severe degenerative changes are seen in the hips with flattening of the left humeral head.  IMPRESSION: Improving gaseous distention of bowel.   Original Report Authenticated By: Reyes Ivan, M.D.    Dg Abd Portable 2v  08/15/2012  *RADIOLOGY REPORT*  Clinical Data: Evaluate small bowel obstruction or ileus.  PORTABLE ABDOMEN - 2 VIEW  Comparison: 08/14/2012  Findings: Supine and left lower decubitus images were obtained. There is no evidence to suggest free air.  There is gas within the right colon.  There is persistent gaseous distention of the stomach.  There are still dilated gas-filled loops of small bowel. The contrast in the rectum has apparently passed and there is now gas in the rectum.  Chronic changes involving both hips.  IMPRESSION: There is persistent gaseous distention of the stomach and bowel loops.  There appears to be increased gas within the colon. Findings are more suggestive for an ileus pattern rather than an obstruction at this time.   Original Report Authenticated By: Richarda Overlie, M.D.    Scheduled Meds:    . insulin aspart  0-9 Units Subcutaneous TID WC  . LORazepam  0.5 mg Intravenous TID  . pantoprazole  40 mg Oral Q1200  . potassium chloride  10 mEq Intravenous Q1 Hr x 6  . sodium chloride  10-40 mL Intracatheter Q12H  . sodium glycerophosphate 0.9% NaCl IVPB  20  mmol Intravenous Once  . tiotropium  18 mcg Inhalation Daily   Continuous Infusions:    . dextrose 5 % and 0.45 % NaCl with KCl 20 mEq/L 75 mL/hr at 08/16/12 1455  . fat emulsion    . heparin 1,950 Units/hr (08/16/12 1457)  . TPN (CLINIMIX) +/- additives 40 mL/hr at 08/15/12 1731  . TPN (CLINIMIX) +/- additives      Principal Problem:  *Ileus Active Problems:  ARF on CKD 4  Small bowel obstruction  Hypernatremia  ARTHRITIS, HIPS, BILATERAL  AAA (abdominal aortic aneurysm)  COPD (chronic obstructive pulmonary disease)  Hypotension  Acute encephalopathy  Pulmonary embolism  UTI (lower urinary tract infection)  Dysphagia  Anemia  Depression     Brendia Sacks, MD  [facility] Team 8 Pager [phone]. If 8PM-8AM, please contact night-coverage at [email or web address], password [facility] 08/16/2012, 6:12 PM  LOS: 10 days   Time spent: 25 minutes

## 2012-08-16 NOTE — Progress Notes (Signed)
Nutrition Follow-up/consult  RD consulted for new TPN/TNA  Intervention:   1. TPN per pharmacy  2. Monitor magnesium, potassium, and phosphorus daily for at least 3 days, MD to replete as needed, as pt is at risk for refeeding syndrome given very limited intake since admission on 8/16. 3. Recommend daily weights to assess nutrition adequacy  4. RD will continue to follow    Assessment:   Pt with prolonged ileus and initiation of TPN. Pt was started on clear liquid diet on 8/22. Pt vomited brown emesis per notes on 8/25. AXR shows ileus. NG tube to suction.  TPN started per pharmacy on 8/25.   Pt is at high risk for refeeding syndrome given very limited po intake since admission, 8/16. Please monitor Potassium, Magnesium, Phosphorus x 3 days for signs of refeeding.   Diet Order:  NPO Patient is receiving TPN with Clinimix E 5/15 @ 40 ml/hr.  Lipids (20% IVFE @ 10 ml/hr), multivitamins, and trace elements are provided 3 times weekly (MWF) due to national backorder.  Provides 887 kcal and 48 grams protein daily (based on weekly average).  Meets 59% minimum estimated kcal and 60% minimum estimated protein needs.  TPN advancing to goal rate of 75 ml/hr which will meet 99% of kcal needs and 100% protein needs.    Meds: Scheduled Meds:   . insulin aspart  0-9 Units Subcutaneous TID WC  . LORazepam  0.5 mg Intravenous TID  . pantoprazole  40 mg Oral Q1200  . potassium chloride  10 mEq Intravenous Q1 Hr x 6  . sodium chloride  10-40 mL Intracatheter Q12H  . sodium glycerophosphate 0.9% NaCl IVPB  20 mmol Intravenous Once  . tiotropium  18 mcg Inhalation Daily   Continuous Infusions:   . dextrose 5 % and 0.45 % NaCl with KCl 20 mEq/L 75 mL/hr at 08/15/12 1035  . fat emulsion    . heparin 1,950 Units/hr (08/16/12 1130)  . TPN (CLINIMIX) +/- additives 40 mL/hr at 08/15/12 1731  . TPN (CLINIMIX) +/- additives     PRN Meds:.acetaminophen, morphine injection, ondansetron (ZOFRAN) IV,  promethazine, sodium chloride  Labs:  CMP     Component Value Date/Time   NA 142 08/16/2012 0938   K 4.0 08/16/2012 0938   CL 111 08/16/2012 0938   CO2 24 08/16/2012 0938   GLUCOSE 151* 08/16/2012 0938   BUN 33* 08/16/2012 0938   CREATININE 1.01 08/16/2012 0938   CALCIUM 8.4 08/16/2012 0938   PROT 5.1* 08/16/2012 0938   ALBUMIN 1.7* 08/16/2012 0938   AST 75* 08/16/2012 0938   ALT 82* 08/16/2012 0938   ALKPHOS 86 08/16/2012 0938   BILITOT 0.5 08/16/2012 0938   GFRNONAA 53* 08/16/2012 0938   GFRAA 62* 08/16/2012 0938   Sodium  Date/Time Value Range Status  08/16/2012  9:38 AM 142  135 - 145 mEq/L Final  08/15/2012  1:35 AM 142  135 - 145 mEq/L Final  08/14/2012  5:10 AM 147* 135 - 145 mEq/L Final    Potassium  Date/Time Value Range Status  08/16/2012  9:38 AM 4.0  3.5 - 5.1 mEq/L Final  08/15/2012  1:35 AM 2.9* 3.5 - 5.1 mEq/L Final  08/14/2012  5:10 AM 2.5* 3.5 - 5.1 mEq/L Final     CRITICAL RESULT CALLED TO, READ BACK BY AND VERIFIED WITH:     ZELLNER R,RN 08/14/12 0724 WAYK    Phosphorus  Date/Time Value Range Status  08/16/2012  9:38 AM 1.1* 2.3 - 4.6 mg/dL  Final    Magnesium  Date/Time Value Range Status  08/16/2012  9:38 AM 2.2  1.5 - 2.5 mg/dL Final  06/20/5283  1:32 AM 1.3* 1.5 - 2.5 mg/dL Final  4/40/1027  2:53 AM 2.4  1.5 - 2.5 mg/dL Final       Intake/Output Summary (Last 24 hours) at 08/16/12 1440 Last data filed at 08/15/12 1900  Gross per 24 hour  Intake   1356 ml  Output    300 ml  Net   1056 ml    Weight Status:  192 lbs on 8/23, no new weights since   Re-estimated needs:  1500-1700 kcal, 80-90 gm protein   Nutrition Dx:  Inadequate oral intake r/t altered GI function AEB NPO status, ongoing   Goal:  Tolerate diet advance, unmet New Goal: TPN to meet >/=90% estimated needs until able to tolerate adequate po intake  Monitor:  Diet advance, TPN rate/tolerance, weight, labs    Clarene Duke RD, LDN Pager [phone] After Hours pager [phone]

## 2012-08-16 NOTE — Progress Notes (Signed)
ANTICOAGULATION CONSULT NOTE - Follow Up Consult  Pharmacy Consult for Heparin Indication: Possible right ventricular outflow tract obstruction/thrombus  Allergies  Allergen Reactions  . Asa (Aspirin) Other (See Comments)    "my ears bleed"  . Lidocaine Other (See Comments)    unknown  . Nsaids Other (See Comments)    unknown  . Sulfonamide Derivatives Other (See Comments)    unknown    Patient Measurements: Height: 5\' 4"  (162.6 cm) Weight: 192 lb 14.4 oz (87.5 kg) IBW/kg (Calculated) : 54.7  Heparin Dosing Weight: 74.3 kg  Vital Signs: Temp: 99.3 F (37.4 C) (08/26 0718) Temp src: Oral (08/26 0718) BP: 100/56 mmHg (08/26 0718) Pulse Rate: 71  (08/26 0718)  Labs:  Basename 08/16/12 0938 08/15/12 1040 08/15/12 0135 08/14/12 0510  HGB 9.2* -- 10.1* --  HCT 28.7* -- 31.2* 32.3*  PLT 185 -- 174 174  APTT -- -- -- --  LABPROT -- -- -- --  INR -- -- -- --  HEPARINUNFRC 0.24* 0.48 0.21* --  CREATININE -- -- 1.02 1.13*  CKTOTAL -- -- -- --  CKMB -- -- -- --  TROPONINI -- -- -- --    Estimated Creatinine Clearance: 51.8 ml/min (by C-G formula based on Cr of 1.02).   Medications:  Infusions:    . dextrose 5 % and 0.45 % NaCl with KCl 20 mEq/L 75 mL/hr at 08/15/12 1035  . heparin 1,700 Units/hr (08/15/12 1036)  . TPN (CLINIMIX) +/- additives 40 mL/hr at 08/15/12 1731    Assessment: 77 yo female on heparin for possible RVOT thrombus. Heparin level is subtherapeutic on 1700 units/hr this morning. No bleeding noted, CBC stable. Spoke with RN and heparin has been infusing with no problems.  Goal of Therapy:  Heparin level 0.3-0.7 units/ml Monitor platelets by anticoagulation protocol: Yes   Plan:  -Increase heparin drip to 1950 units/hr -Heparin level 8 hours after rate change -Daily heparin level and CBC while on heparin   [facility], Urich.D., BCPS Clinical Pharmacist Pager: [phone] 08/16/2012 11:24 AM

## 2012-08-16 NOTE — Progress Notes (Signed)
PARENTERAL NUTRITION CONSULT NOTE - Follow-Up  Pharmacy Consult for TPN Indication:Prolonged ileus   Allergies  Allergen Reactions  . Asa (Aspirin) Other (See Comments)    "my ears bleed"  . Lidocaine Other (See Comments)    unknown  . Nsaids Other (See Comments)    unknown  . Sulfonamide Derivatives Other (See Comments)    unknown    Patient Measurements: Height: 5\' 4"  (162.6 cm) Weight: 192 lb 14.4 oz (87.5 kg) IBW/kg (Calculated) : 54.7  Adjusted Body Weight: 60Kg Usual Weight: 199 lb  Vital Signs: Temp: 99.3 F (37.4 C) (08/26 0718) Temp src: Oral (08/26 0718) BP: 100/56 mmHg (08/26 0718) Pulse Rate: 71  (08/26 0718) Intake/Output from previous day: 08/25 0701 - 08/26 0700 In: 2150.1 [I.V.:1390.8; IV Piggyback:700; TPN:59.3] Out: 300 [Emesis/NG output:300] Intake/Output from this shift:    Labs:  [facility] 08/16/12 0938 08/15/12 0135 08/14/12 0510  WBC 14.7* 16.2* 14.7*  HGB 9.2* 10.1* 10.6*  HCT 28.7* 31.2* 32.3*  PLT 185 174 174  APTT -- -- --  INR -- -- --     Basename 08/16/12 0938 08/15/12 0135 08/14/12 0510  NA 142 142 147*  K 4.0 2.9* 2.5*  CL 111 107 111  CO2 24 25 24   GLUCOSE 151* 132* 131*  BUN 33* 36* 42*  CREATININE 1.01 1.02 1.13*  LABCREA -- -- --  CREAT24HRUR -- -- --  CALCIUM 8.4 8.8 9.1  MG 2.2 1.3* --  PHOS 1.1* -- --  PROT 5.1* 5.8* --  ALBUMIN 1.7* 1.9* --  AST 75* 102* --  ALT 82* 98* --  ALKPHOS 86 88 --  BILITOT 0.5 0.6 --  BILIDIR -- -- --  IBILI -- -- --  PREALBUMIN -- -- --  TRIG 190* -- --  CHOLHDL -- -- --  CHOL 90 -- --   Estimated Creatinine Clearance: 52.3 ml/min (by C-G formula based on Cr of 1.01).    Basename 08/16/12 0804 08/15/12 2343 08/15/12 1705  GLUCAP 132* 142* 108*   Insulin Requirements in the past 24 hours:  None  Current Nutrition:  Clinimix E 5/15 at 40 ml/hr NPO  Nutritional Goals per RD:  1500-1700 kCal, 80-90 grams of protein per day  Assessment: Ms. Congleton was admitted 8/16  from [facility] d/t low BP, abd pain and UTI. She has a known AAA for which there are no surgical plans presently.   GI: Pt has been treated with bowel rest, bowel regimen, NGT and  reglan since admit. NGT replaced for repeat emesis. She has essentially not had any substantial intake since at least 8/16. No BM reported since 8/21. Noted KUB 8/25 still c/w ileus. Abd distended - may do SBFT tomorrow. She is at a high refeeding risk. Endo: No prior hx of DM. CBGs well controlled- requiring no SSI. TSH this admit was WNL. Lytes: Phos 1.1 today - will replace. K 4 (at goal) and MgNoted low K and Mg 2.2 (at goal) past repletion yesterday. Goal with ileus is ~4 for K and ~2 for Mg to help with GI peristalsis.  Renal: Scr is normal, UOP not being measured. Cards: BP low-nl. HR ok. Hepatobil: LFTs are 2-3 x ULN, Tbil WNL. Alb low at 1.9 Neuro: Confused per MD assessment 8/24, GCS reportedly 13 today ID: [id number]. WBC remains elevated but trending down- no abx on board. (s/p Rocephin x 7 days) Best Practices: IV hep for PE, PO PPI Central access: PICC   Plan:  - Continue Clinimix E  5/15 at 40cc/hr- will advance to eventual goal of 75cc/hr based on patient tolerance. - Will add thiamine and folic acid x 3 days since pt high risk of refeeding (Day #1/3) - Will provide select trace elements, MVI and IV lipids (at 10cc/hr) only d/t Sport and exercise psychologist. - Will give sodium glycerophosphate 20 mmol today - must run over 8 hours (this is substitution now due to backorder of sodium phos). Will recheck phos in a.m. - Will f/up labs in AM.  Thanks, Christoper Fabian, PharmD, BCPS Clinical pharmacist, pager [phone] 08/16/2012 11:31 AM

## 2012-08-16 NOTE — Progress Notes (Signed)
Pt still NPO for SBO, NG tube with suction. SLP will defer treatment and f/u in one day. Harlon Ditty, MA CCC-SLP [phone]

## 2012-08-16 NOTE — Progress Notes (Signed)
Subjective: Awake this morning and talkative, does not appear to be in any acute distress. Daughter at bedside reports +BM this morning.  Objective: Vital signs in last 24 hours: Temp:  [98.2 F (36.8 C)-100.1 F (37.8 C)] 99.3 F (37.4 C) (08/26 0718) Pulse Rate:  [70-81] 71  (08/26 0718) Resp:  [18] 18  (08/26 0718) BP: (100-110)/(56-87) 100/56 mmHg (08/26 0718) SpO2:  [96 %-97 %] 97 % (08/26 0718) Last BM Date: 08/14/12  Intake/Output from previous day: 08/25 0701 - 08/26 0700 In: 2150.1 [I.V.:1390.8; IV Piggyback:700; TPN:59.3] Out: 300 [Emesis/NG output:300] Intake/Output this shift:    General appearance: alert, cooperative and appears stated age Chest: CTA bilaterally Cardiac: RRR Abdomen: soft, non tender, NG tube appears to be draining well, (357ml/24 hr), no BS. Low grade temp of 99.e VS stable otherwise, no labs this am. On TPN  Lab Results:   Basename 08/15/12 0135 08/14/12 0510  WBC 16.2* 14.7*  HGB 10.1* 10.6*  HCT 31.2* 32.3*  PLT 174 174   BMET  Basename 08/15/12 0135 08/14/12 0510  NA 142 147*  K 2.9* 2.5*  CL 107 111  CO2 25 24  GLUCOSE 132* 131*  BUN 36* 42*  CREATININE 1.02 1.13*  CALCIUM 8.8 9.1   PT/INR No results found for this basename: LABPROT:2,INR:2 in the last 72 hours ABG No results found for this basename: PHART:2,PCO2:2,PO2:2,HCO3:2 in the last 72 hours  Studies/Results: Dg Abd 1 View  08/14/2012  *RADIOLOGY REPORT*  Clinical Data: Abdominal distention.  ABDOMEN - 1 VIEW  Comparison: 08/11/2012  Findings: Supine images of the abdomen demonstrate gaseous distention of the stomach.  There are still gas-filled loops of small bowel.  Overall, there is decreased small bowel distention. Again noted is sclerosis and abnormality of the femoral heads. There is high-density material contrast within the rectum.  The nasogastric tube has been removed.  Large gas-filled structure in the right lower quadrant is nonspecific and could be  associated with the cecum. Limited evaluation for free air on the supine images.  IMPRESSION: Decreased small bowel gaseous distention.  Slightly increased gaseous distention of the stomach since removal of the nasogastric tube.   Original Report Authenticated By: Richarda Overlie, M.D.    Ct Head Wo Contrast  08/15/2012  *RADIOLOGY REPORT*  Clinical Data: Acute encephalopathy  CT HEAD WITHOUT CONTRAST  Technique:  Contiguous axial images were obtained from the base of the skull through the vertex without contrast.  Comparison: 09/11/2008  Findings: Motion degraded images.  No evidence of parenchymal hemorrhage or extra-axial fluid collection. No mass lesion, mass effect, or midline shift.  No CT evidence of acute infarction.  Subcortical white matter and periventricular small vessel ischemic changes.  Intracranial atherosclerosis.  Global cortical atrophy.  No ventriculomegaly.  The visualized paranasal sinuses are essentially clear. The mastoid air cells are unopacified.  No evidence of calvarial fracture.  IMPRESSION: No evidence of acute intracranial abnormality.  Atrophy with small vessel ischemic changes and intracranial atherosclerosis.   Original Report Authenticated By: Charline Bills, M.D.    Dg Abd Portable 2v  08/15/2012  *RADIOLOGY REPORT*  Clinical Data: Evaluate small bowel obstruction or ileus.  PORTABLE ABDOMEN - 2 VIEW  Comparison: 08/14/2012  Findings: Supine and left lower decubitus images were obtained. There is no evidence to suggest free air.  There is gas within the right colon.  There is persistent gaseous distention of the stomach.  There are still dilated gas-filled loops of small bowel. The contrast in the rectum  has apparently passed and there is now gas in the rectum.  Chronic changes involving both hips.  IMPRESSION: There is persistent gaseous distention of the stomach and bowel loops.  There appears to be increased gas within the colon. Findings are more suggestive for an ileus  pattern rather than an obstruction at this time.   Original Report Authenticated By: Richarda Overlie, M.D.     Anti-infectives: Anti-infectives     Start     Dose/Rate Route Frequency Ordered Stop   08/11/12 1200   cefTRIAXone (ROCEPHIN) 1 g in dextrose 5 % 50 mL IVPB  Status:  Discontinued        1 g 100 mL/hr over 30 Minutes Intravenous Every 24 hours 08/11/12 1137 08/14/12 1351   08/07/12 0930   cefTRIAXone (ROCEPHIN) 1 g in dextrose 5 % 50 mL IVPB  Status:  Discontinued        1 g 100 mL/hr over 30 Minutes Intravenous Every 24 hours 08/07/12 0845 08/10/12 2310          Assessment/Plan: s/p * No surgery found * 1. Continue NPO, TPN. 2. NG to LCS 3. ? SBFT study if ileus does not resolve soon. 4. Recheck labs (pending) this am 5. Hypokalemia     LOS: 10 days    Chantille Navarrete 08/16/2012

## 2012-08-17 ENCOUNTER — Inpatient Hospital Stay (HOSPITAL_COMMUNITY): Payer: Medicare Other

## 2012-08-17 DIAGNOSIS — D649 Anemia, unspecified: Secondary | ICD-10-CM

## 2012-08-17 LAB — BASIC METABOLIC PANEL
CO2: 24 mEq/L (ref 19–32)
Chloride: 114 mEq/L — ABNORMAL HIGH (ref 96–112)
Creatinine, Ser: 0.95 mg/dL (ref 0.50–1.10)
GFR calc Af Amer: 67 mL/min — ABNORMAL LOW (ref >90)
Potassium: 4 mEq/L (ref 3.5–5.1)
Sodium: 145 mEq/L (ref 135–145)

## 2012-08-17 LAB — TRIGLYCERIDES: Triglycerides: 218 mg/dL — ABNORMAL HIGH (ref <150)

## 2012-08-17 LAB — GLUCOSE, CAPILLARY
Glucose-Capillary: 119 mg/dL — ABNORMAL HIGH (ref 70–99)
Glucose-Capillary: 95 mg/dL (ref 70–99)

## 2012-08-17 LAB — PHOSPHORUS: Phosphorus: 2 mg/dL — ABNORMAL LOW (ref 2.3–4.6)

## 2012-08-17 LAB — CBC
HCT: 25.3 % — ABNORMAL LOW (ref 36.0–46.0)
Hemoglobin: 8.2 g/dL — ABNORMAL LOW (ref 12.0–15.0)
MCHC: 32.4 g/dL (ref 30.0–36.0)
RBC: 2.85 MIL/uL — ABNORMAL LOW (ref 3.87–5.11)
WBC: 11.3 10*3/uL — ABNORMAL HIGH (ref 4.0–10.5)

## 2012-08-17 LAB — MAGNESIUM: Magnesium: 1.9 mg/dL (ref 1.5–2.5)

## 2012-08-17 LAB — PREALBUMIN: Prealbumin: 7.1 mg/dL — ABNORMAL LOW (ref 17.0–34.0)

## 2012-08-17 LAB — HEPARIN LEVEL (UNFRACTIONATED): Heparin Unfractionated: 0.19 IU/mL — ABNORMAL LOW (ref 0.30–0.70)

## 2012-08-17 MED ORDER — HEPARIN BOLUS VIA INFUSION
2000.0000 [IU] | Freq: Once | INTRAVENOUS | Status: AC
  Administered 2012-08-17: 2000 [IU] via INTRAVENOUS
  Filled 2012-08-17: qty 2000

## 2012-08-17 MED ORDER — INSULIN ASPART 100 UNIT/ML ~~LOC~~ SOLN
0.0000 [IU] | SUBCUTANEOUS | Status: DC
  Administered 2012-08-18 – 2012-08-19 (×6): 1 [IU] via SUBCUTANEOUS

## 2012-08-17 MED ORDER — IOHEXOL 350 MG/ML SOLN
100.0000 mL | Freq: Once | INTRAVENOUS | Status: AC | PRN
  Administered 2012-08-17: 100 mL via INTRAVENOUS

## 2012-08-17 MED ORDER — SODIUM CHLORIDE 0.9 % IV SOLN
20.0000 mmol | Freq: Once | INTRAVENOUS | Status: AC
  Administered 2012-08-17: 20 mmol via INTRAVENOUS
  Filled 2012-08-17: qty 20

## 2012-08-17 MED ORDER — THIAMINE HCL 100 MG/ML IJ SOLN
INTRAVENOUS | Status: AC
  Administered 2012-08-17: 21:00:00 via INTRAVENOUS
  Filled 2012-08-17: qty 2000

## 2012-08-17 NOTE — Progress Notes (Signed)
Informed by Dr. Irene Limbo that pt. PICC line was coiled upon itself, wanted RN to notify IV team.  IV team paged and return call from [place], [place] and informed.  Stated will try to flush down, but my need to be replaced need order for f/u chest x-ray.  Will call Dr. Irene Limbo for new order and continue to follow.  Forbes Cellar, RN

## 2012-08-17 NOTE — Progress Notes (Signed)
Occupational Therapy Treatment Patient Details Name: [name] [name] MRN: [id number] DOB: Sep 08, 1935 Today's Date: 08/17/2012 Time: 4098-1191 OT Time Calculation (min): 21 min  OT Assessment / Plan / Recommendation Comments on Treatment Session      Follow Up Recommendations  Skilled nursing facility    Barriers to Discharge       Equipment Recommendations  Defer to next venue    Recommendations for Other Services    Frequency Min 2X/week   Plan Discharge plan remains appropriate    Precautions / Restrictions Precautions Precautions: Fall Restrictions Weight Bearing Restrictions: No   Pertinent Vitals/Pain 4/10 back; RN medicated    ADL  Eating/Feeding: Performed;Set up;Supervision/safety (with ST present:  they did bedside eval) Where Assessed - Eating/Feeding: Edge of bed Grooming: Performed;Wash/dry face;Wash/dry hands;Brushing hair;Set up Where Assessed - Grooming: Unsupported sitting Transfers/Ambulation Related to ADLs: bed mob mod A--L sidelying to sit and sit to L sidelying.  Unable to scoot up towards [facility] with total A x 1.  Pt very fearful of falling ADL Comments: ST came to do bedside eval while OT was working on sitting EOB with grooming.  Pt followed almost all commands--did not initiate bending LLE when first asked to roll to R    OT Diagnosis:    OT Problem List:   OT Treatment Interventions:     OT Goals Acute Rehab OT Goals Time For Goal Achievement: 08/26/12 ADL Goals Pt Will Perform Eating: with mod assist;Supported;with cueing (comment type and amount) ADL Goal: Eating - Progress: Progressing toward goals Pt Will Perform Grooming: with mod assist;Supported;Sitting, chair;with cueing (comment type and amount) ADL Goal: Grooming - Progress: Met Pt Will Perform Upper Body Bathing: with set-up;Unsupported;Sitting, edge of bed ADL Goal: Upper Body Bathing - Progress: Goal set today Pt Will Perform Upper Body Dressing: with min  assist;Unsupported;Sitting, bed ADL Goal: Upper Body Dressing - Progress: Goal set today Miscellaneous OT Goals Miscellaneous OT Goal #1: Pt will follow 1 step simple command 50 % of session  OT Goal: Miscellaneous Goal #1 - Progress: Met  Visit Information  Last OT Received On: 08/17/12 Reason Eval/Treat Not Completed: Other (comment) (awaiting ct angio)    Subjective Data      Prior Functioning       Cognition  Overall Cognitive Status: Impaired Area of Impairment: Following commands;Memory;Problem solving Arousal/Alertness: Awake/alert Orientation Level: Disoriented to;Place;Time;Situation Behavior During Session: WFL for tasks performed Current Attention Level: Sustained Following Commands: Follows one step commands inconsistently Safety/Judgement: Decreased awareness of safety precautions    Mobility Bed Mobility Bed Mobility: Sit to Sidelying Right Right Sidelying to Sit: 3: Mod assist Sit to Sidelying Right: 3: Mod assist;HOB flat Details for Bed Mobility Assistance: sat unsupported EOB x 10 minutes   Exercises    Balance Static Sitting Balance Static Sitting - Balance Support: Bilateral upper extremity supported;Feet supported Static Sitting - Level of Assistance: 5: Stand by assistance Static Sitting - Comment/# of Minutes: 10  End of Session    GO     [name] [name] 08/17/2012, 4:04 PM Marica Otter, OTR/L [phone] 08/17/2012

## 2012-08-17 NOTE — Progress Notes (Signed)
OT Note:  Awaiting CT angio.  Will check back.  [place], [place] [phone] 08/17/2012

## 2012-08-17 NOTE — Progress Notes (Addendum)
[facility] PROGRESS NOTE  [name] [name] ZOX:[id number] DOB: [birth year] DOA: 08/06/2012 PCP: Fredirick Maudlin, MD  Assessment/Plan: 1. Ileus--clinically some improvement. Management per surgery. TNA. Hopefully can advance diet soon. 2. Acute renal failure-Resolved. Suspect secondary to diuretics (BUN/creatinine pattern prerenal), possible component of Voltaren gel. Creatinine was high 8/13 (see [facility] records). CT abd/pelvis on 8/14 done without IV contrast (oral only) secondary to high creatinine. No CT was performed at [facility]. No history of IV dye allergy or contrast-induced injury. 3. Hypernatremia--Resolved, likely secondary to IVF in setting of minimal PO intake (hyperchloremia noted as well).  4. Hypokalemia--repleted. Magnesium repleted. Phosphorus improved, repletion per pharmacy.  5. Acute encephalopathy--Continues to improve on Ativan. Secondary to narcotic & BZD withdrawal. Developed suddenly 8/21 (all previous notes report alert, normal mental status). Of note, as outpatient was on scheduled Lortab TID and Xanax TID as outpatient (Xanax for 20 years per family), neither continued on admission. No listed history of dementia, not on any medications for dementia or mood disorder as an outpatient. Exam non-focal, infectious workup negative. Head CT negative.  6. Possible right ventricular outflow tract obstruction/thrombus--Echo notable for possible RVOT thrombus--unable to get VQ scan secondary to patient's agitation. Creatinine now normal--check CTA today, if negative, will stop heparin. LE dopplers negative. 7. UTI--treated with 8 days Rocephin, no culture on admission. Patient had already been given antibiotics, therefore later culture unreliable. 8. Hypotension--resolved. Multifactorial--diuretic-inducted dehydraton coupled with anti-hypertenisves in setting of ARF. Echocardiogram suggesedt possible RVOT. 9. Dysphagia--Needs repeat speech evaluation when clinically improved. TNA  for now. 10. Elevated transaminases--Mild improvement. AP & bilirubin normal. Possibly secondary to Crestor. Follow-up as outpatient. 11. Leukocytosis--trending down. No evidence of infection. 12. Anemia of acute illness--Slowly trending downwards, follow with daily CBC. Transfuse for Hgb <7. 13. SVT 8/21--no recurrence. Cardiac enzymes negative. Monitor. Related to RVOT obstruction? 14. AAA--Followed by Dr. Calla Kicks in 6 months. CT 8/14--stable 5.4 cm infrarenal aneurysm w/o leak or rupture  15. Depression with anxiety--continue BZD. 16. COPD--Stable currently  17. Chronic hip pain--by history too high risk for hip surgery. Morphine for pain control. 18. RBBB--chronic.  Overall improving.  Code Status: Full Family Communication:  Disposition Plan: eventually SNF  Brendia Sacks, MD  [facility] Team 8 Pager [phone]. If 8PM-8AM, please contact night-coverage at [email or web address], password [facility] 08/17/2012, 10:03 AM  LOS: 11 days   Brief narrative: 33 yow who was transferred from [facility] to hypotension, ABD pain, UTI along with a history of an AAA which is being followed by Dr, Waverly Ferrari. She lives at an Assisted Living facility [facility]) and has had complaints of Abd Pain, nausea and dry heaves X 3 days. Admitted for hypotension, possible early sepsis. Seen by vascular surguery for history of AAA (stable) and signed off. Abdominal films revealed SBO versus ileus and patient was seen by general surgery for persistant ileus. Currently on TNA for ileus and dysphagia. Noted to be confused 8/21 (day 6 of hospitalization) and futher history was suggestive of BZD and narcotic withdrawal. Mental status continues to improved on Ativan. Plan is for eventual SNf.  Consultants:  General surgery  Vascular surgery--signed off  PT--SNF  OT--SNF  ST--thin liquids (patient refused other consistencies)  Procedures:  8/20 2D echocardiogram--LVEF 60-65%, grade 1  diastolic dysfunction. Possible RVOT thrombus.  8/24 BLE venous doppler--No obvious evidence of DVT, superficial thrombosis, or Baker's Cyst. Technically difficult study due to patient cooperation.   Antibiotics:  Rocephin 8/17>>8/24  HPI/Subjective: Low grade fever last night 100.8. Patient denies pain, no  n/v/. Wants to eat. Discussed with RN--no concerns.  Objective: Filed Vitals:   08/16/12 0718 08/16/12 1434 08/16/12 2234 08/17/12 0734  BP: 100/56 97/51 112/63 111/48  Pulse: 71 75 64   Temp: 99.3 F (37.4 C) 98.2 F (36.8 C) 100.8 F (38.2 C) 98.6 F (37 C)  TempSrc: Oral Oral  Oral  Resp: 18 18 18 21   Height:      Weight:    89.7 kg (197 lb 12 oz)  SpO2: 97% 99% 97% 91%    Intake/Output Summary (Last 24 hours) at 08/17/12 1003 Last data filed at 08/17/12 0600  Gross per 24 hour  Intake 4225.75 ml  Output    100 ml  Net 4125.75 ml   Filed Weights   08/06/12 1949 08/13/12 1600 08/17/12 0734  Weight: 85 kg (187 lb 6.3 oz) 87.5 kg (192 lb 14.4 oz) 89.7 kg (197 lb 12 oz)    Exam:   General:  Appears comfortable, calm. Wearing soft mitts. Speech clear, alert and oriented to self, hospital, month. Follows commands.  ENT: grossly normal hearing  Cardiovascular: RRR, no m/r/g. No LE edema  Respiratory: CTA bilaterally, no w/r/r. Normal respiratory effort.  Abdomen: distended, soft, non-tender, increased bowel sounds  Skin: no rash or induration; non-tender  Neuro: grossly intact   Psychiatric: as above  Data Reviewed: Basic Metabolic Panel:  Lab 08/17/12 6962 08/16/12 0938 08/15/12 0135 08/14/12 0510 08/13/12 0520  NA 145 142 142 147* 154*  K 4.0 4.0 2.9* 2.5* 3.0*  CL 114* 111 107 111 118*  CO2 24 24 25 24 24   GLUCOSE 118* 151* 132* 131* 159*  BUN 27* 33* 36* 42* 58*  CREATININE 0.95 1.01 1.02 1.13* 1.35*  CALCIUM 8.3* 8.4 8.8 9.1 9.5  MG 1.9 2.2 1.3* -- --  PHOS 2.0* 1.1* -- -- --   Liver Function Tests:  Lab 08/16/12 0938 08/15/12 0135  AST  75* 102*  ALT 82* 98*  ALKPHOS 86 88  BILITOT 0.5 0.6  PROT 5.1* 5.8*  ALBUMIN 1.7* 1.9*   CBC:  Lab 08/17/12 0543 08/16/12 0938 08/15/12 0135 08/14/12 0510 08/13/12 0520  WBC 11.3* 14.7* 16.2* 14.7* 12.3*  NEUTROABS -- 11.2* -- -- --  HGB 8.2* 9.2* 10.1* 10.6* 10.8*  HCT 25.3* 28.7* 31.2* 32.3* 32.9*  MCV 88.8 87.8 86.2 85.2 85.0  PLT 219 185 174 174 200    Basename 05/23/12 1326  PROBNP 484.0*    Recent Results (from the past 240 hour(s))  URINE CULTURE     Status: Normal   Collection Time   08/07/12  5:13 PM      Component Value Range Status Comment   Specimen Description URINE, CLEAN CATCH   Final    Special Requests NONE   Final    Culture  Setup Time 08/08/2012 03:48   Final    Colony Count 10,000 COLONIES/ML   Final    Culture     Final    Value: Multiple bacterial morphotypes present, none predominant. Suggest appropriate recollection if clinically indicated.   Report Status 08/09/2012 FINAL   Final      Studies: Ct Head Wo Contrast  08/15/2012  *RADIOLOGY REPORT*  Clinical Data: Acute encephalopathy  CT HEAD WITHOUT CONTRAST  Technique:  Contiguous axial images were obtained from the base of the skull through the vertex without contrast.  Comparison: 09/11/2008  Findings: Motion degraded images.  No evidence of parenchymal hemorrhage or extra-axial fluid collection. No mass lesion, mass effect, or midline  shift.  No CT evidence of acute infarction.  Subcortical white matter and periventricular small vessel ischemic changes.  Intracranial atherosclerosis.  Global cortical atrophy.  No ventriculomegaly.  The visualized paranasal sinuses are essentially clear. The mastoid air cells are unopacified.  No evidence of calvarial fracture.  IMPRESSION: No evidence of acute intracranial abnormality.  Atrophy with small vessel ischemic changes and intracranial atherosclerosis.   Original Report Authenticated By: Charline Bills, M.D.    Dg Abd Portable 2v  08/16/2012   *RADIOLOGY REPORT*  Clinical Data: Abdominal pain.  Follow up ileus.  PORTABLE ABDOMEN - 2 VIEW  Comparison: 08/15/2012.  Findings: Nasogastric tube tip is at the gastroesophageal junction. Stomach is decompressed when compared with yesterday's exam.  There is persistent dilatation of bowel in the low central abdomen.  No free air.  Severe degenerative changes are seen in the hips with flattening of the left humeral head.  IMPRESSION: Improving gaseous distention of bowel.   Original Report Authenticated By: Reyes Ivan, M.D.    Scheduled Meds:    . heparin  2,000 Units Intravenous Once  . insulin aspart  0-9 Units Subcutaneous TID WC  . LORazepam  0.5 mg Intravenous TID  . pantoprazole  40 mg Oral Q1200  . sodium chloride  10-40 mL Intracatheter Q12H  . sodium glycerophosphate 0.9% NaCl IVPB  20 mmol Intravenous Once  . sodium glycerophosphate 0.9% NaCl IVPB  20 mmol Intravenous Once  . tiotropium  18 mcg Inhalation Daily   Continuous Infusions:    . fat emulsion 240 mL (08/16/12 1818)  . heparin 2,200 Units/hr (08/17/12 0725)  . TPN (CLINIMIX) +/- additives 40 mL/hr at 08/15/12 1731  . TPN (CLINIMIX) +/- additives 40 mL/hr at 08/16/12 1817  . TPN (CLINIMIX) +/- additives    . DISCONTD: dextrose 5 % and 0.45 % NaCl with KCl 20 mEq/L 75 mL/hr at 08/16/12 1455    Principal Problem:  *Ileus Active Problems:  ARF on CKD 4  Small bowel obstruction  Hypernatremia  ARTHRITIS, HIPS, BILATERAL  AAA (abdominal aortic aneurysm)  COPD (chronic obstructive pulmonary disease)  Hypotension  Acute encephalopathy  Pulmonary embolism  UTI (lower urinary tract infection)  Dysphagia  Anemia  Depression     Brendia Sacks, MD  [facility] Team 8 Pager [phone]. If 8PM-8AM, please contact night-coverage at [email or web address], password [facility] 08/17/2012, 10:03 AM  LOS: 11 days   Time spent: 20 minutes

## 2012-08-17 NOTE — Progress Notes (Signed)
ANTICOAGULATION CONSULT NOTE - Follow Up Consult  Pharmacy Consult for Heparin Indication: Possible right ventricular outflow tract obstruction/thrombus  Allergies  Allergen Reactions  . Asa (Aspirin) Other (See Comments)    "my ears bleed"  . Lidocaine Other (See Comments)    unknown  . Nsaids Other (See Comments)    unknown  . Sulfonamide Derivatives Other (See Comments)    unknown    Labs:  Basename 08/17/12 0543 08/16/12 1928 08/16/12 0938 08/15/12 0135  HGB 8.2* -- 9.2* --  HCT 25.3* -- 28.7* 31.2*  PLT 219 -- 185 174  APTT -- -- -- --  LABPROT -- -- -- --  INR -- -- -- --  HEPARINUNFRC 0.19* 0.50 0.24* --  CREATININE -- -- 1.01 1.02  CKTOTAL -- -- -- --  CKMB -- -- -- --  TROPONINI -- -- -- --    Estimated Creatinine Clearance: 52.3 ml/min (by C-G formula based on Cr of 1.01).   Medications:  Infusions:     . fat emulsion 240 mL (08/16/12 1818)  . heparin 1,950 Units/hr (08/17/12 0351)  . TPN (CLINIMIX) +/- additives 40 mL/hr at 08/15/12 1731  . TPN (CLINIMIX) +/- additives 40 mL/hr at 08/16/12 1817  . DISCONTD: dextrose 5 % and 0.45 % NaCl with KCl 20 mEq/L 75 mL/hr at 08/16/12 1455    Assessment: 77 yo female on heparin for possible RVOT thrombus. Heparin level is below-goal on 1950 units/hr. No problem with line / infusion per RN.   Goal of Therapy:  Heparin level 0.3-0.7 units/ml Monitor platelets by anticoagulation protocol: Yes   Plan:  1. Heparin 2000 unit bolus x 1, then increase IV infusion to 2200 units/hr.  2. Heparin level in 8 hours.   Lorre Munroe, PharmD 08/17/2012 7:01 AM

## 2012-08-17 NOTE — Progress Notes (Signed)
Speech Language Pathology Dysphagia Treatment Patient Details Name: [name] [name] MRN: [id number] DOB: 1935-12-10 Today's Date: 08/17/2012 Time: 4098-1191 SLP Time Calculation (min): 11 min  Assessment / Plan / Recommendation Clinical Impression  Pt much more alert, NG tube recently d/c'd. Pt demonstrated normal oral and oropharyngeal function with water, small bites of puree. No signs of aspiration. Pt would be safe to progress to a Dys 2 (fine chop) diet with thin liquids when ready from a medical standpoint. Discussed with MD. SLP to f/u in 1-2 days to check tolerance.     Diet Recommendation  Initiate / Change Diet: Dysphagia 2 (fine chop);Thin liquid;Other (comment) (When medically ready)    SLP Plan     Pertinent Vitals/Pain NA   Swallowing Goals  SLP Swallowing Goals Patient will consume recommended diet without observed clinical signs of aspiration with: Moderate assistance Swallow Study Goal #1 - Progress: Progressing toward goal Patient will utilize recommended strategies during swallow to increase swallowing safety with: Moderate assistance;Supervision/safety Swallow Study Goal #2 - Progress: Progressing toward goal  General Temperature Spikes Noted: Yes Behavior/Cognition: Alert;Cooperative;Pleasant mood Oral Cavity - Dentition: Edentulous Patient Positioning: Upright in bed  Oral Cavity - Oral Hygiene     Dysphagia Treatment Treatment focused on: Upgraded PO texture trials Treatment Methods/Modalities: Skilled observation Patient observed directly with PO's: Yes Type of PO's observed: Thin liquids;Dysphagia 1 (puree) Feeding: Able to feed self Liquids provided via: Cup;Straw Type of cueing: Verbal Amount of cueing: Minimal   GO     [name] [name], [name] [name] 08/17/2012, 3:57 PM

## 2012-08-17 NOTE — Progress Notes (Signed)
Physical Therapy Treatment Patient Details Name: [name] M Alarid MRN: [id number] DOB: May 03, 1935 Today's Date: 08/17/2012 Time: 1191-4782 PT Time Calculation (min): 9 min  PT Assessment / Plan / Recommendation Comments on Treatment Session  Pt with ileus and encephalopathy. Pt sidelying on IV lines on arrival and when sat up noted redness from pressure and educated pt however end of session pt returned to same position lying on lines with increased pressure due to objects but refused to move or have them repositioned. Pt very resistant to mobility today and not participating fully. Will attempt at later date.     Follow Up Recommendations       Barriers to Discharge        Equipment Recommendations       Recommendations for Other Services    Frequency     Plan Discharge plan remains appropriate;Frequency remains appropriate    Precautions / Restrictions Precautions Precautions: Fall   Pertinent Vitals/Pain Pt reports back pain but would not rate    Mobility  Bed Mobility Bed Mobility: Sit to Sidelying Right Right Sidelying to Sit: 3: Mod assist;HOB flat;With rails Sit to Sidelying Right: 3: Mod assist;HOB flat Details for Bed Mobility Assistance: Pt sat up from bed with assist to elevate trunk and bring legs to EOB. Pt sat EOB with cueing to stand to step to [facility] which pt had agreed to try while in bed. Pt stated "no, I"m not doing it" and returned to right sidelying with assist for bil LE to bring them to surface. Pt refused any further mobility or activity. Transfers Transfers: Not assessed (pt refused attempting)    Exercises     PT Diagnosis:    PT Problem List:   PT Treatment Interventions:     PT Goals Acute Rehab PT Goals PT Goal: Sit to Stand - Progress: Not progressing PT Transfer Goal: Bed to Chair/Chair to Bed - Progress: Not progressing Additional Goals PT Goal: Additional Goal #1 - Progress: Not progressing  Visit Information  Last PT Received On:  08/17/12 Assistance Needed: +2    Subjective Data  Subjective: if you won't give me something to drink I'm not getting up   Cognition  Overall Cognitive Status: Impaired Area of Impairment: Following commands;Memory;Problem solving Arousal/Alertness: Awake/alert Behavior During Session: Agitated Current Attention Level: Focused Memory: Decreased recall of precautions Memory Deficits: Pt unaware of why she cannot have food or drink Following Commands: Follows one step commands inconsistently Safety/Judgement: Decreased awareness of safety precautions    Balance  Static Sitting Balance Static Sitting - Balance Support: Bilateral upper extremity supported;Feet supported Static Sitting - Level of Assistance: 5: Stand by assistance Static Sitting - Comment/# of Minutes: 2  End of Session PT - End of Session Activity Tolerance: Other (comment) (pt refused further activity) Patient left: in bed;with call bell/phone within reach   GP     Delorse Lek 08/17/2012, 12:10 PM Delaney Meigs, PT [phone]

## 2012-08-17 NOTE — Progress Notes (Signed)
At bedside to power flush PICC due to malposition.  Patient sat forward and ports x2 power flushed with 20cc NS each.  Patient remains in sitting up position with PCXR ordered for follow up.  TNA remains off at this time. Kalyb Pemble, Lajean Manes

## 2012-08-17 NOTE — Progress Notes (Signed)
SBO seems to be improving.  Clamp NG tube this morning - possibly d/c later today.  Wilmon Arms. Corliss Skains, MD, [facility]  08/17/2012 10:28 AM

## 2012-08-17 NOTE — Progress Notes (Signed)
Patient ID: [name] [name], female   DOB: 19-Jan-1935, 77 y.o.   MRN: [id number]    Subjective: Reports abd pain that resolved after urinating, no BM per nursing since the 25th.  Pt reports having BMs however.  Objective: Vital signs in last 24 hours: Temp:  [98.2 F (36.8 C)-100.8 F (38.2 C)] 98.6 F (37 C) (08/27 0734) Pulse Rate:  [64-75] 64  (08/26 2234) Resp:  [18-21] 21  (08/27 0734) BP: (97-112)/(48-63) 111/48 mmHg (08/27 0734) SpO2:  [91 %-99 %] 91 % (08/27 0734) Weight:  [197 lb 12 oz (89.7 kg)] 197 lb 12 oz (89.7 kg) (08/27 0734) Last BM Date: 08/14/12  Intake/Output from previous day: 08/26 0701 - 08/27 0700 In: 4225.8 [I.V.:2388.8; IV Piggyback:280; TPN:1557] Out: 100 [Emesis/NG output:100] Intake/Output this shift:   Physical Exam: General: no acute distress, difficult to understand at times Chest: CTA bilaterally Heart: RRR Abdomen: soft, non tender, NG tube in place ( overnight), no BS.  Lab Results:   Basename 08/17/12 0543 08/16/12 0938  WBC 11.3* 14.7*  HGB 8.2* 9.2*  HCT 25.3* 28.7*  PLT 219 185   BMET  Basename 08/17/12 0543 08/16/12 0938  NA 145 142  K 4.0 4.0  CL 114* 111  CO2 24 24  GLUCOSE 118* 151*  BUN 27* 33*  CREATININE 0.95 1.01  CALCIUM 8.3* 8.4   PT/INR No results found for this basename: LABPROT:2,INR:2 in the last 72 hours ABG No results found for this basename: PHART:2,PCO2:2,PO2:2,HCO3:2 in the last 72 hours  Studies/Results: Ct Head Wo Contrast  08/15/2012  *RADIOLOGY REPORT*  Clinical Data: Acute encephalopathy  CT HEAD WITHOUT CONTRAST  Technique:  Contiguous axial images were obtained from the base of the skull through the vertex without contrast.  Comparison: 09/11/2008  Findings: Motion degraded images.  No evidence of parenchymal hemorrhage or extra-axial fluid collection. No mass lesion, mass effect, or midline shift.  No CT evidence of acute infarction.  Subcortical Cambelle Suchecki matter and periventricular small vessel  ischemic changes.  Intracranial atherosclerosis.  Global cortical atrophy.  No ventriculomegaly.  The visualized paranasal sinuses are essentially clear. The mastoid air cells are unopacified.  No evidence of calvarial fracture.  IMPRESSION: No evidence of acute intracranial abnormality.  Atrophy with small vessel ischemic changes and intracranial atherosclerosis.   Original Report Authenticated By: Charline Bills, M.D.    Dg Abd Portable 2v  08/16/2012  *RADIOLOGY REPORT*  Clinical Data: Abdominal pain.  Follow up ileus.  PORTABLE ABDOMEN - 2 VIEW  Comparison: 08/15/2012.  Findings: Nasogastric tube tip is at the gastroesophageal junction. Stomach is decompressed when compared with yesterday's exam.  There is persistent dilatation of bowel in the low central abdomen.  No free air.  Severe degenerative changes are seen in the hips with flattening of the left humeral head.  IMPRESSION: Improving gaseous distention of bowel.   Original Report Authenticated By: Reyes Ivan, M.D.    Dg Abd Portable 2v  08/15/2012  *RADIOLOGY REPORT*  Clinical Data: Evaluate small bowel obstruction or ileus.  PORTABLE ABDOMEN - 2 VIEW  Comparison: 08/14/2012  Findings: Supine and left lower decubitus images were obtained. There is no evidence to suggest free air.  There is gas within the right colon.  There is persistent gaseous distention of the stomach.  There are still dilated gas-filled loops of small bowel. The contrast in the rectum has apparently passed and there is now gas in the rectum.  Chronic changes involving both hips.  IMPRESSION: There is  persistent gaseous distention of the stomach and bowel loops.  There appears to be increased gas within the colon. Findings are more suggestive for an ileus pattern rather than an obstruction at this time.   Original Report Authenticated By: Richarda Overlie, M.D.     Anti-infectives: Anti-infectives     Start     Dose/Rate Route Frequency Ordered Stop   08/11/12 1200    cefTRIAXone (ROCEPHIN) 1 g in dextrose 5 % 50 mL IVPB  Status:  Discontinued        1 g 100 mL/hr over 30 Minutes Intravenous Every 24 hours 08/11/12 1137 08/14/12 1351   08/07/12 0930   cefTRIAXone (ROCEPHIN) 1 g in dextrose 5 % 50 mL IVPB  Status:  Discontinued        1 g 100 mL/hr over 30 Minutes Intravenous Every 24 hours 08/07/12 0845 08/10/12 2310          Assessment/Plan: SBO: 1. Clamp NGT and give Ice chips, if tolerates can d/c ngt later today, cont TPN., ?repeat films tomorrow if not improving. 2. Hypokalemia: replaced 3. Leukocytosis: improving     LOS: 11 days    Aeden Matranga 08/17/2012

## 2012-08-17 NOTE — Progress Notes (Signed)
Clinical Social Worker attempted to contact pt daughter to discuss bed offers and left voice message. Clinical Social Worker to await return phone call from pt daughter and will discuss bed offers at that time. Clinical Social Worker to continue to follow.  Jacklynn Lewis, MSW, LCSWA  Clinical Social Work [phone]

## 2012-08-17 NOTE — Progress Notes (Signed)
PARENTERAL NUTRITION CONSULT NOTE - Follow-Up  Pharmacy Consult for TPN Indication:Prolonged ileus   Allergies  Allergen Reactions  . Asa (Aspirin) Other (See Comments)    "my ears bleed"  . Lidocaine Other (See Comments)    unknown  . Nsaids Other (See Comments)    unknown  . Sulfonamide Derivatives Other (See Comments)    unknown    Patient Measurements: Height: 5\' 4"  (162.6 cm) Weight: 197 lb 12 oz (89.7 kg) IBW/kg (Calculated) : 54.7  Adjusted Body Weight: 60Kg Usual Weight: 199 lb  Vital Signs: Temp: 98.6 F (37 C) (08/27 0734) Temp src: Oral (08/27 0734) BP: 111/48 mmHg (08/27 0734) Pulse Rate: 64  (08/26 2234) Intake/Output from previous day: 08/26 0701 - 08/27 0700 In: 4225.8 [I.V.:2388.8; IV Piggyback:280; TPN:1557] Out: 100 [Emesis/NG output:100] Intake/Output from this shift:    Labs:  [facility] 08/17/12 0543 08/16/12 0938 08/15/12 0135  WBC 11.3* 14.7* 16.2*  HGB 8.2* 9.2* 10.1*  HCT 25.3* 28.7* 31.2*  PLT 219 185 174  APTT -- -- --  INR -- -- --     Basename 08/17/12 0543 08/16/12 0938 08/15/12 0135  NA 145 142 142  K 4.0 4.0 2.9*  CL 114* 111 107  CO2 24 24 25   GLUCOSE 118* 151* 132*  BUN 27* 33* 36*  CREATININE 0.95 1.01 1.02  LABCREA -- -- --  CREAT24HRUR -- -- --  CALCIUM 8.3* 8.4 8.8  MG 1.9 2.2 1.3*  PHOS 2.0* 1.1* --  PROT -- 5.1* 5.8*  ALBUMIN -- 1.7* 1.9*  AST -- 75* 102*  ALT -- 82* 98*  ALKPHOS -- 86 88  BILITOT -- 0.5 0.6  BILIDIR -- -- --  IBILI -- -- --  PREALBUMIN -- 7.3* --  TRIG 218* 190* --  CHOLHDL -- -- --  CHOL -- 90 --   Estimated Creatinine Clearance: 56.3 ml/min (by C-G formula based on Cr of 0.95).    Basename 08/17/12 0753 08/17/12 0359 08/16/12 2359  GLUCAP 108* 119* 109*   Insulin Requirements in the past 24 hours:  2 units SSI  Current Nutrition:  Clinimix E 5/15 at 40 ml/hr + lipids 7ml/hr M/W/F NPO  Nutritional Goals per RD:  1500-1700 kCal, 80-90 grams of protein per  day  Assessment: Ms. Selkirk was admitted 8/16 from [facility] d/t low BP, abd pain and UTI. She has a known AAA for which there are no surgical plans presently.   GI: Pt has been treated with bowel rest, bowel regimen, NGT and reglan since admit. Clamping NGT today. She has essentially not had any substantial intake since at least 8/16. No BM reported since 8/25 but pt reports BMs. She is at a high refeeding risk. Endo: No prior hx of DM. CBGs well controlled- requiring very little SSI. TSH this admit was WNL. Lytes: Phos 2 today - will replace. K 4 (at goal) and Mg 1.9. Goal with ileus is ~4 for K and ~2 for Mg to help with GI peristalsis.  Renal: Scr is normal, UOP not being measured accurately. Cards: BP low-nl. HR ok. Hepatobil: LFTs are 2-3 x ULN, Tbil WNL. Alb low at 1.7. Prealbumin low 7.3. TG 218 - will watch closely. ID: Tm [id number]. WBC remains elevated but trending down- no abx on board. (s/p Rocephin x 7 days) Best Practices: IV hep for PE, PO PPI Central access: PICC   Plan:  - Increase Clinimix E 5/15 to 60 ml/hr - advancing slowly to  eventual goal of 75cc/hr based  on patient tolerance. - Will add thiamine and folic acid x 3 days since pt high risk of refeeding (Day #2/3) - Will provide select trace elements, MVI and IV lipids (at 10cc/hr) only d/t Sport and exercise psychologist. - Will give sodium glycerophosphate 20 mmol today - must run over 8 hours (this is substitution now due to backorder of sodium phos). Will recheck phos in a.m. - Will f/up labs in AM.  Thanks, Christoper Fabian, PharmD, BCPS Clinical pharmacist, pager [phone] 08/17/2012 8:20 AM

## 2012-08-18 DIAGNOSIS — K560 Paralytic ileus: Secondary | ICD-10-CM

## 2012-08-18 DIAGNOSIS — R1310 Dysphagia, unspecified: Secondary | ICD-10-CM

## 2012-08-18 LAB — CBC
HCT: 24.9 % — ABNORMAL LOW (ref 36.0–46.0)
Hemoglobin: 7.8 g/dL — ABNORMAL LOW (ref 12.0–15.0)
WBC: 10.1 10*3/uL (ref 4.0–10.5)

## 2012-08-18 LAB — GLUCOSE, CAPILLARY
Glucose-Capillary: 128 mg/dL — ABNORMAL HIGH (ref 70–99)
Glucose-Capillary: 134 mg/dL — ABNORMAL HIGH (ref 70–99)
Glucose-Capillary: 111 mg/dL — ABNORMAL HIGH (ref 70–99)

## 2012-08-18 LAB — URINALYSIS, ROUTINE W REFLEX MICROSCOPIC
Glucose, UA: NEGATIVE mg/dL
Ketones, ur: NEGATIVE mg/dL
Protein, ur: NEGATIVE mg/dL

## 2012-08-18 LAB — BASIC METABOLIC PANEL
CO2: 25 mEq/L (ref 19–32)
Calcium: 8.5 mg/dL (ref 8.4–10.5)
Chloride: 111 mEq/L (ref 96–112)
Creatinine, Ser: 0.88 mg/dL (ref 0.50–1.10)
Glucose, Bld: 128 mg/dL — ABNORMAL HIGH (ref 70–99)

## 2012-08-18 LAB — PHOSPHORUS: Phosphorus: 2.4 mg/dL (ref 2.3–4.6)

## 2012-08-18 MED ORDER — ZINC TRACE METAL 1 MG/ML IV SOLN
INTRAVENOUS | Status: AC
  Administered 2012-08-18: 18:00:00 via INTRAVENOUS
  Filled 2012-08-18: qty 2000

## 2012-08-18 MED ORDER — ENOXAPARIN SODIUM 40 MG/0.4ML ~~LOC~~ SOLN
40.0000 mg | SUBCUTANEOUS | Status: DC
  Administered 2012-08-18 – 2012-08-20 (×3): 40 mg via SUBCUTANEOUS
  Filled 2012-08-18 (×3): qty 0.4

## 2012-08-18 MED ORDER — FAT EMULSION 20 % IV EMUL
240.0000 mL | INTRAVENOUS | Status: AC
  Administered 2012-08-18: 240 mL via INTRAVENOUS
  Filled 2012-08-18: qty 250

## 2012-08-18 MED ORDER — PERFLUTREN LIPID MICROSPHERE
INTRAVENOUS | Status: AC
  Filled 2012-08-18: qty 10

## 2012-08-18 NOTE — Progress Notes (Signed)
PARENTERAL NUTRITION CONSULT NOTE - Follow-Up  Pharmacy Consult for TPN Indication:Prolonged ileus   Allergies  Allergen Reactions  . Asa (Aspirin) Other (See Comments)    "my ears bleed"  . Lidocaine Other (See Comments)    unknown  . Nsaids Other (See Comments)    unknown  . Sulfonamide Derivatives Other (See Comments)    unknown    Patient Measurements: Height: 5\' 4"  (162.6 cm) Weight: 197 lb 1.5 oz (89.4 kg) IBW/kg (Calculated) : 54.7  Adjusted Body Weight: 60Kg Usual Weight: 199 lb  Vital Signs: Temp: 99.4 F (37.4 C) (08/28 0500) Temp src: Oral (08/28 0500) BP: 100/64 mmHg (08/28 0500) Pulse Rate: 69  (08/28 0500) Intake/Output from previous day: 08/27 0701 - 08/28 0700 In: 820.3 [I.V.:160.3; TPN:660] Out: -  Intake/Output from this shift:    Labs:  Basename 08/18/12 0500 08/17/12 0543 08/16/12 0938  WBC 10.1 11.3* 14.7*  HGB 7.8* 8.2* 9.2*  HCT 24.9* 25.3* 28.7*  PLT 237 219 185  APTT -- -- --  INR -- -- --     Basename 08/17/12 0543 08/16/12 0938  NA 145 142  K 4.0 4.0  CL 114* 111  CO2 24 24  GLUCOSE 118* 151*  BUN 27* 33*  CREATININE 0.95 1.01  LABCREA -- --  CREAT24HRUR -- --  CALCIUM 8.3* 8.4  MG 1.9 2.2  PHOS 2.0* 1.1*  PROT -- 5.1*  ALBUMIN -- 1.7*  AST -- 75*  ALT -- 82*  ALKPHOS -- 86  BILITOT -- 0.5  BILIDIR -- --  IBILI -- --  PREALBUMIN 7.1* 7.3*  TRIG 218* 190*  CHOLHDL -- --  CHOL -- 90   Estimated Creatinine Clearance: 56.3 ml/min (by C-G formula based on Cr of 0.95).    Basename 08/18/12 0744 08/18/12 0503 08/18/12 0030  GLUCAP 128* 125* 100*   Insulin Requirements in the past 24 hours:  1 unit SSI  Current Nutrition:  Clinimix E 5/15 at 60 ml/hr + lipids 103ml/hr M/W/F NPO  Nutritional Goals per RD:  1500-1700 kCal, 80-90 grams of protein per day  Assessment: [name] [name] was admitted 8/16 from [facility] d/t low BP, abd pain and UTI. She has a known AAA for which there are no surgical plans presently.    GI: Pt has been treated with bowel rest, bowel regimen, NGT and reglan since admit. NGT d/c and advancing to clear liquids today. She has essentially not had any substantial intake since at least 8/16. No BM reported since 8/25 but pt reports BMs.  Endo: No prior hx of DM. CBGs well controlled- requiring very little SSI. TSH this admit was WNL. Lytes: Phos 2.4 today - better past replacement x 2 days. K 4 (at goal). Goal with ileus is ~4 for K and ~2 for Mg to help with GI peristalsis.  Renal: Scr is normal, UOP not being measured accurately. Cards: BP low-nl. HR ok. Hepatobil: LFTs are 2-3 x ULN, Tbil WNL. Alb low at 1.7. Prealbumin low 7.3. TG 218 - will watch closely. ID: Tm 99.5. WBC back to wnl. no abx on board. (s/p Rocephin x 7 days) Anticoagulation: Heparin gtt d/c yesterday after CTA chest neg for RVOT. Best Practices: PO PPI Central access: PICC   Plan:  - Change Clinimix to E 5/20 at goal rate of 70 ml/hr + lipids 81ml/hr on M/W/F to provide daily average of 1684 kcal and 84 gm protein. - Will add thiamine and folic acid x 3 days since pt high risk  of refeeding (Day #3/3) - Will provide select trace elements, MVI and IV lipids (at 10cc/hr) only d/t Sport and exercise psychologist. - Will f/up TPN labs in AM.  Thanks, Christoper Fabian, PharmD, BCPS Clinical pharmacist, pager [phone] 08/18/2012 9:13 AM

## 2012-08-18 NOTE — Progress Notes (Signed)
Subjective: Awake and alert this morning; states that she is hungry! Denies any BM or flatus, N/V.  Objective: Vital signs in last 24 hours: Temp:  [98.4 F (36.9 C)-99.5 F (37.5 C)] 99.4 F (37.4 C) (08/28 0500) Pulse Rate:  [60-69] 69  (08/28 0500) Resp:  [18-20] 20  (08/28 0500) BP: (76-112)/(60-74) 100/64 mmHg (08/28 0500) SpO2:  [92 %-96 %] 95 % (08/28 0500) Weight:  [197 lb 1.5 oz (89.4 kg)] 197 lb 1.5 oz (89.4 kg) (08/28 0500) Last BM Date: 08/16/12  Intake/Output from previous day: 08/27 0701 - 08/28 0700 In: 820.3 [I.V.:160.3; TPN:660] Out: -  Intake/Output this shift:    General appearance: alert, cooperative, appears stated age and no distress Chest: CTA bilaterally Cardiac: RRR, no M/R/G Abdomen: soft, diffusely tender, Minimal BS, No flatus or BM per patient or record. WBC continues to trend downward. Hgb drifting downward (medicine Team monitoring) currently at 7.8  Lab Results:   Basename 08/18/12 0500 08/17/12 0543  WBC 10.1 11.3*  HGB 7.8* 8.2*  HCT 24.9* 25.3*  PLT 237 219   BMET  Basename 08/17/12 0543 08/16/12 0938  NA 145 142  K 4.0 4.0  CL 114* 111  CO2 24 24  GLUCOSE 118* 151*  BUN 27* 33*  CREATININE 0.95 1.01  CALCIUM 8.3* 8.4   PT/INR No results found for this basename: LABPROT:2,INR:2 in the last 72 hours ABG No results found for this basename: PHART:2,PCO2:2,PO2:2,HCO3:2 in the last 72 hours  Studies/Results: Ct Angio Chest Pe W/cm &/or Wo Cm  08/17/2012  *RADIOLOGY REPORT*  Clinical Data: Possible right ventricular outflow tract thrombus noted on recent two-dimensional echocardiogram.  Evaluate for potential RVOT thrombus or pulmonary embolism.  CT ANGIOGRAPHY CHEST  Technique:  Multidetector CT imaging of the chest using the standard protocol during bolus administration of intravenous contrast. Multiplanar reconstructed images including MIPs were obtained and reviewed to evaluate the vascular anatomy.  Contrast:  OMNIPAQUE IOHEXOL 350 MG/ML SOLN  Comparison: Chest c t 01/18/2007.  Findings:  Mediastinum: Study is severely limited by patient respiratory motion.  Because of this, accurate assessment for distal segmental and subsegmental sized emboli is not possible on this examination. Additionally, in the right lower lobe, even proximal segmental sized emboli cannot be excluded.  With these limitations in mind, there is no evidence of central, lobe are or proximal segmental sized embolism.  Additionally, there is no filling defects identified within the right ventricle, or the right ventricular outflow tract to suggest thrombus. Heart size is normal. There is no significant pericardial fluid, thickening or pericardial calcification. There is atherosclerosis of the thoracic aorta, the great vessels of the mediastinum and the coronary arteries, including calcified atherosclerotic plaque in the left main, left anterior descending, left circumflex and right coronary arteries. Extensive atheromatous plaque is identified throughout the descending thoracic aorta, with multiple extensions impinging upon the lumen of the aorta (this may be increased risk for embolic event associated with any future catheterization attempts). A nasogastric tube extends to the gastroesophageal junction. Esophagus is unremarkable in appearance. No pathologically enlarged mediastinal or hilar lymph nodes. Right upper extremity PICC is in place with the tip coiled in the right subclavian vein adjacent to the junction with the right internal jugular vein.  Lungs/Pleura: No consolidative airspace disease.  No pleural effusions.  Extensive respiratory motion limits sensitivity for detection of small pulmonary nodules.  No large suspicious appearing pulmonary nodules or masses are confidently identified.  Upper Abdomen: Unremarkable.  Musculoskeletal: There are no  aggressive appearing lytic or blastic lesions noted in the visualized portions of the skeleton.   IMPRESSION: 1.  Exceedingly limited examination secondary to respiratory motion with no obvious pulmonary embolism, as above. 2.  No filling defect in the right ventricular outflow tract to suggest RVOT thrombus. 3.  The patient's right upper extremity PICC is coiled upon itself with the tip in the subclavian vein adjacent to the junction with the right internal jugular vein. 4. Severe atherosclerosis, including left main and three-vessel coronary artery disease.  Assessment for potential risk factor modification, dietary therapy or pharmacologic therapy may be warranted, if clinically indicated. 5.  Other incidental findings, as above.   Original Report Authenticated By: Florencia Reasons, M.D.    Dg Chest Port 1 View  08/17/2012  *RADIOLOGY REPORT*  Clinical Data: PICC repositioning.  PORTABLE CHEST - 1 VIEW  Comparison: CT scan dated 08/17/2012 and chest x-ray dated 05/23/2012  Findings: PICC line is looped in the right subclavian vein and the tip is in the right subclavian vein.  NG tube tip is below the diaphragm.  Proximal side hole is in the distal esophagus.  Heart size and vascularity are normal and the lungs are clear. Slight thoracic scoliosis.  IMPRESSION: PICC line and and the its tip are looped in the right subclavian vein.  Clear lungs.   Original Report Authenticated By: Gwynn Burly, M.D.     Anti-infectives: Anti-infectives     Start     Dose/Rate Route Frequency Ordered Stop   08/11/12 1200   cefTRIAXone (ROCEPHIN) 1 g in dextrose 5 % 50 mL IVPB  Status:  Discontinued        1 g 100 mL/hr over 30 Minutes Intravenous Every 24 hours 08/11/12 1137 08/14/12 1351   08/07/12 0930   cefTRIAXone (ROCEPHIN) 1 g in dextrose 5 % 50 mL IVPB  Status:  Discontinued        1 g 100 mL/hr over 30 Minutes Intravenous Every 24 hours 08/07/12 0845 08/10/12 2310          Assessment/Plan: s/p * No surgery found *  1. Will advance diet to sips of clears 2. Check stool for hemocult   LOS:  12 days    DUANNE, JEREMY 08/18/2012

## 2012-08-18 NOTE — Progress Notes (Signed)
PATIENT DETAILS Name: [name] M Scheper Age: 77 y.o. Sex: female Date of Birth: [birth year] Admit Date: 08/06/2012 Admitting Physician Renae Fickle, MD ZOX:[id number] L, MD  Subjective: Slowly improving. No BM  Assessment/Plan: Principal Problem:  *Ileus -per [facility] -Starting sips of clear liquids -c/w TNA for now  Active Problems: Acute renal failure -Resolved. - Suspect secondary to diuretics (BUN/creatinine pattern prerenal), possible component of Voltaren gel. Creatinine was high 8/13 (see [facility] records). CT abd/pelvis on 8/14 done without IV contrast (oral only) secondary to high creatinine. No CT was performed at [facility]. No history of IV dye allergy or contrast-induced injury.  Hypernatremia- -Resolved, likely secondary to IVF in setting of minimal PO intake (hyperchloremia noted as well).  Hypokalemia- -repleted. Magnesium repleted. Phosphorus improved, repletion per pharmacy.  -monitor periodically  Acute encephalopathy- -seems to have resolved - Secondary to narcotic & BZD withdrawal. Developed suddenly 8/21 (all previous notes report alert, normal mental status). Of note, as outpatient was on scheduled Lortab TID and Xanax TID as outpatient (Xanax for 20 years per family), neither continued on admission. No listed history of dementia, not on any medications for dementia or mood disorder as an outpatient. Exam non-focal, infectious workup negative. Head CT negative.   Possible right ventricular outflow tract obstruction/thrombus- -Echo notable for possible RVOT thrombus--unable to get VQ scan secondary to patient's agitation.  CTA chest on 8/27-negative for RVOT-therefore heparin stopped. LE dopplers negative.Repeat Echo today.  UTI- -treated with 8 days Rocephin, no culture on admission. Patient had already been given antibiotics, therefore later culture unreliable. -currently not on any antibiotics  Hypotension--resolved.  Multifactorial--diuretic-inducted  dehydraton coupled with anti-hypertenisves in setting of ARF.   Dysphagia- -TNA for now. -seen by ST 8/27-will advance to Dys 2 diet when clinically ready  Anemia of acute illness- -Slowly trending downwards, follow with daily CBC. Transfuse for Hgb <7.  Elevated transaminases- -Mild improvement. AP & bilirubin normal. Possibly secondary to Crestor vs hypotension -monitor periodically  SVT 8/21- -no recurrence. Cardiac enzymes negative. Monitor.  AAA- -Followed by Dr. Calla Kicks in 6 months. CT 8/14--stable 5.4 cm infrarenal aneurysm w/o leak or rupture   Depression with anxiety- -continue BZD  COPD- -Stable currently  -c/w Spiriva  Chronic hip pain- -by history too high risk for hip surgery. Morphine for pain control.   RBBB--chronic.  Disposition: Remain inpatient  DVT Prophylaxis: Prophylactic Lovenox or Heparin  Code Status: Full code or DNR  CONSULTS:  general surgery  PHYSICAL EXAM: Vital signs in last 24 hours: Filed Vitals:   08/17/12 1407 08/17/12 2051 08/18/12 0500 08/18/12 1054  BP: 112/60 76/74 100/64   Pulse: 60 69 69   Temp: 98.4 F (36.9 C) 99.5 F (37.5 C) 99.4 F (37.4 C)   TempSrc: Oral  Oral   Resp: 18 20 20    Height:      Weight:   89.4 kg (197 lb 1.5 oz)   SpO2: 92% 96% 95% 96%    Weight change:  Body mass index is 33.83 kg/(m^2).   Gen Exam: Awake and alert with clear speech.   Neck: Supple, No JVD.   Chest: B/L Clear.   CVS: S1 S2 Regular, no murmurs.  Abdomen: soft, BS +, non tender, non distended.  Extremities: no edema, lower extremities warm to touch. Neurologic: Non Focal.   Skin: No Rash.   Wounds: N/A.    Intake/Output from previous day:  Intake/Output Summary (Last 24 hours) at 08/18/12 1214 Last data filed at 08/18/12 0600  Gross per 24 hour  Intake 820.29 ml  Output      0 ml  Net 820.29 ml     LAB RESULTS: CBC  Lab 08/18/12 0500 08/17/12 0543 08/16/12 0938 08/15/12 0135 08/14/12 0510  WBC  10.1 11.3* 14.7* 16.2* 14.7*  HGB 7.8* 8.2* 9.2* 10.1* 10.6*  HCT 24.9* 25.3* 28.7* 31.2* 32.3*  PLT 237 219 185 174 174  MCV 89.6 88.8 87.8 86.2 85.2  MCH 28.1 28.8 28.1 27.9 28.0  MCHC 31.3 32.4 32.1 32.4 32.8  RDW 15.9* 16.0* 15.8* 15.6* 16.0*  LYMPHSABS -- -- 2.4 -- --  MONOABS -- -- 1.0 -- --  EOSABS -- -- 0.1 -- --  BASOSABS -- -- 0.1 -- --  BANDABS -- -- -- -- --    Chemistries   Lab 08/17/12 0543 08/16/12 0938 08/15/12 0135 08/14/12 0510 08/13/12 0520  NA 145 142 142 147* 154*  K 4.0 4.0 2.9* 2.5* 3.0*  CL 114* 111 107 111 118*  CO2 24 24 25 24 24   GLUCOSE 118* 151* 132* 131* 159*  BUN 27* 33* 36* 42* 58*  CREATININE 0.95 1.01 1.02 1.13* 1.35*  CALCIUM 8.3* 8.4 8.8 9.1 9.5  MG 1.9 2.2 1.3* -- --    CBG:  Lab 08/18/12 0744 08/18/12 0503 08/18/12 0030 08/17/12 2049 08/17/12 1639  GLUCAP 128* 125* 100* 77 95    GFR Estimated Creatinine Clearance: 56.3 ml/min (by C-G formula based on Cr of 0.95).  Coagulation profile  Lab 08/11/12 1244  INR 1.29  PROTIME --    Cardiac Enzymes No results found for this basename: CK:3,CKMB:3,TROPONINI:3,MYOGLOBIN:3 in the last 168 hours  No components found with this basename: POCBNP:3 No results found for this basename: DDIMER:2 in the last 72 hours No results found for this basename: HGBA1C:2 in the last 72 hours  Basename 08/17/12 0543 08/16/12 0938  CHOL -- 90  HDL -- --  LDLCALC -- --  TRIG 218* 190*  CHOLHDL -- --  LDLDIRECT -- --   No results found for this basename: TSH,T4TOTAL,FREET3,T3FREE,THYROIDAB in the last 72 hours No results found for this basename: VITAMINB12:2,FOLATE:2,FERRITIN:2,TIBC:2,IRON:2,RETICCTPCT:2 in the last 72 hours No results found for this basename: LIPASE:2,AMYLASE:2 in the last 72 hours  Urine Studies No results found for this basename: UACOL:2,UAPR:2,USPG:2,UPH:2,UTP:2,UGL:2,UKET:2,UBIL:2,UHGB:2,UNIT:2,UROB:2,ULEU:2,UEPI:2,UWBC:2,URBC:2,UBAC:2,CAST:2,CRYS:2,UCOM:2,BILUA:2 in the last  72 hours  MICROBIOLOGY: No results found for this or any previous visit (from the past 240 hour(s)).  RADIOLOGY STUDIES/RESULTS: Ct Abdomen Pelvis Wo Contrast  08/04/2012  *RADIOLOGY REPORT*  Clinical Data: Follow-up abdominal aortic aneurysm.  CT ABDOMEN AND PELVIS WITHOUT CONTRAST  Technique:  Multidetector CT imaging of the abdomen and pelvis was performed following the standard protocol without intravenous contrast.  Comparison: 12/04/2011  Findings: Infrarenal abdominal aortic aneurysm measures 5.4 x 5.4 cm, without significant change in size since previous study.  No evidence of aneurysm leak or rupture.  No evidence of iliac artery aneurysm.  Tiny nonobstructing left intrarenal calculus again noted measuring less than 5 mm.  Bilateral renal parenchymal atrophy again demonstrated, however there is no evidence of renal mass or hydronephrosis.  The other abdominal parenchymal organs have a normal appearance on this noncontrast study. Small calcified gallstones seen in the gallbladder fundus, however there is no evidence of cholecystitis.  Uterus and adnexa are unremarkable in appearance.  No soft tissue masses or lymphadenopathy identified.  No evidence of inflammatory process or abnormal fluid collections.  Severe bilateral hip DJD also noted with chronic collapse of the articular surface of both femoral heads.  IMPRESSION:  1.  Stable 5.4 cm infrarenal abdominal aortic aneurysm.  No evidence of aneurysm leak or rupture. 2.  Nonobstructing left nephrolithiasis and cholelithiasis.  Original Report Authenticated By: Danae Orleans, M.D.   Dg Abd 1 View  08/14/2012  *RADIOLOGY REPORT*  Clinical Data: Abdominal distention.  ABDOMEN - 1 VIEW  Comparison: 08/11/2012  Findings: Supine images of the abdomen demonstrate gaseous distention of the stomach.  There are still gas-filled loops of small bowel.  Overall, there is decreased small bowel distention. Again noted is sclerosis and abnormality of the femoral  heads. There is high-density material contrast within the rectum.  The nasogastric tube has been removed.  Large gas-filled structure in the right lower quadrant is nonspecific and could be associated with the cecum. Limited evaluation for free air on the supine images.  IMPRESSION: Decreased small bowel gaseous distention.  Slightly increased gaseous distention of the stomach since removal of the nasogastric tube.   Original Report Authenticated By: Richarda Overlie, M.D.    Ct Head Wo Contrast  08/15/2012  *RADIOLOGY REPORT*  Clinical Data: Acute encephalopathy  CT HEAD WITHOUT CONTRAST  Technique:  Contiguous axial images were obtained from the base of the skull through the vertex without contrast.  Comparison: 09/11/2008  Findings: Motion degraded images.  No evidence of parenchymal hemorrhage or extra-axial fluid collection. No mass lesion, mass effect, or midline shift.  No CT evidence of acute infarction.  Subcortical white matter and periventricular small vessel ischemic changes.  Intracranial atherosclerosis.  Global cortical atrophy.  No ventriculomegaly.  The visualized paranasal sinuses are essentially clear. The mastoid air cells are unopacified.  No evidence of calvarial fracture.  IMPRESSION: No evidence of acute intracranial abnormality.  Atrophy with small vessel ischemic changes and intracranial atherosclerosis.   Original Report Authenticated By: Charline Bills, M.D.    Ct Angio Chest Pe W/cm &/or Wo Cm  08/17/2012  *RADIOLOGY REPORT*  Clinical Data: Possible right ventricular outflow tract thrombus noted on recent two-dimensional echocardiogram.  Evaluate for potential RVOT thrombus or pulmonary embolism.  CT ANGIOGRAPHY CHEST  Technique:  Multidetector CT imaging of the chest using the standard protocol during bolus administration of intravenous contrast. Multiplanar reconstructed images including MIPs were obtained and reviewed to evaluate the vascular anatomy.  Contrast: OMNIPAQUE  IOHEXOL 350 MG/ML SOLN  Comparison: Chest c t 01/18/2007.  Findings:  Mediastinum: Study is severely limited by patient respiratory motion.  Because of this, accurate assessment for distal segmental and subsegmental sized emboli is not possible on this examination. Additionally, in the right lower lobe, even proximal segmental sized emboli cannot be excluded.  With these limitations in mind, there is no evidence of central, lobe are or proximal segmental sized embolism.  Additionally, there is no filling defects identified within the right ventricle, or the right ventricular outflow tract to suggest thrombus. Heart size is normal. There is no significant pericardial fluid, thickening or pericardial calcification. There is atherosclerosis of the thoracic aorta, the great vessels of the mediastinum and the coronary arteries, including calcified atherosclerotic plaque in the left main, left anterior descending, left circumflex and right coronary arteries. Extensive atheromatous plaque is identified throughout the descending thoracic aorta, with multiple extensions impinging upon the lumen of the aorta (this may be increased risk for embolic event associated with any future catheterization attempts). A nasogastric tube extends to the gastroesophageal junction. Esophagus is unremarkable in appearance. No pathologically enlarged mediastinal or hilar lymph nodes. Right upper extremity PICC is in place with the tip coiled in  the right subclavian vein adjacent to the junction with the right internal jugular vein.  Lungs/Pleura: No consolidative airspace disease.  No pleural effusions.  Extensive respiratory motion limits sensitivity for detection of small pulmonary nodules.  No large suspicious appearing pulmonary nodules or masses are confidently identified.  Upper Abdomen: Unremarkable.  Musculoskeletal: There are no aggressive appearing lytic or blastic lesions noted in the visualized portions of the skeleton.   IMPRESSION: 1.  Exceedingly limited examination secondary to respiratory motion with no obvious pulmonary embolism, as above. 2.  No filling defect in the right ventricular outflow tract to suggest RVOT thrombus. 3.  The patient's right upper extremity PICC is coiled upon itself with the tip in the subclavian vein adjacent to the junction with the right internal jugular vein. 4. Severe atherosclerosis, including left main and three-vessel coronary artery disease.  Assessment for potential risk factor modification, dietary therapy or pharmacologic therapy may be warranted, if clinically indicated. 5.  Other incidental findings, as above.   Original Report Authenticated By: Florencia Reasons, M.D.    Dg Chest Port 1 View  08/17/2012  *RADIOLOGY REPORT*  Clinical Data: PICC repositioning.  PORTABLE CHEST - 1 VIEW  Comparison: CT scan dated 08/17/2012 and chest x-ray dated 05/23/2012  Findings: PICC line is looped in the right subclavian vein and the tip is in the right subclavian vein.  NG tube tip is below the diaphragm.  Proximal side hole is in the distal esophagus.  Heart size and vascularity are normal and the lungs are clear. Slight thoracic scoliosis.  IMPRESSION: PICC line and and the its tip are looped in the right subclavian vein.  Clear lungs.   Original Report Authenticated By: Gwynn Burly, M.D.    Dg Abd 2 Views  08/11/2012  *RADIOLOGY REPORT*  Clinical Data: Small bowel obstruction  ABDOMEN - 2 VIEW  Comparison: 08/10/2012  Findings: Persistent distended small bowel loops with air-fluid levels consistent with small bowel obstruction.  No free abdominal air.  IMPRESSION:  Again noted distended small bowel loops with air-fluid levels consistent with small bowel obstruction.   Original Report Authenticated By: Natasha Mead, M.D.    Dg Abd 2 Views  08/10/2012  *RADIOLOGY REPORT*  Clinical Data: Abdominal distention, nausea.  ABDOMEN - 2 VIEW  Comparison:   the previous day's study  Findings: No  free air on the left lateral decubitus radiograph. Multiple dilated small bowel loops throughout the abdomen with fluid levels on the decubitus film.  Residual contrast in the decompressed colon. Nasogastric tube extends into the nondilated stomach.  IMPRESSION:  1.  Persistent   small bowel obstruction.  No free air.   Original Report Authenticated By: Osa Craver, M.D.    Dg Abd Portable 1v  08/09/2012  *RADIOLOGY REPORT*  Clinical Data: Abdominal distention  PORTABLE ABDOMEN - 1 VIEW  Comparison: 08/18  Findings: There is persistent small bowel dilatation, similar to the previous exam, consistent with small bowel obstruction.  Small amount of gas present in the colon.  Nasogastric tube has its tip in the stomach.  IMPRESSION: Persistent small bowel obstruction pattern.   Original Report Authenticated By: Thomasenia Sales, M.D. ( 08/09/2012 07:48:04 )    Dg Abd Portable 2v  08/16/2012  *RADIOLOGY REPORT*  Clinical Data: Abdominal pain.  Follow up ileus.  PORTABLE ABDOMEN - 2 VIEW  Comparison: 08/15/2012.  Findings: Nasogastric tube tip is at the gastroesophageal junction. Stomach is decompressed when compared with yesterday's exam.  There is  persistent dilatation of bowel in the low central abdomen.  No free air.  Severe degenerative changes are seen in the hips with flattening of the left humeral head.  IMPRESSION: Improving gaseous distention of bowel.   Original Report Authenticated By: Reyes Ivan, M.D.    Dg Abd Portable 2v  08/15/2012  *RADIOLOGY REPORT*  Clinical Data: Evaluate small bowel obstruction or ileus.  PORTABLE ABDOMEN - 2 VIEW  Comparison: 08/14/2012  Findings: Supine and left lower decubitus images were obtained. There is no evidence to suggest free air.  There is gas within the right colon.  There is persistent gaseous distention of the stomach.  There are still dilated gas-filled loops of small bowel. The contrast in the rectum has apparently passed and there is now gas  in the rectum.  Chronic changes involving both hips.  IMPRESSION: There is persistent gaseous distention of the stomach and bowel loops.  There appears to be increased gas within the colon. Findings are more suggestive for an ileus pattern rather than an obstruction at this time.   Original Report Authenticated By: Richarda Overlie, M.D.    Dg Abd Portable 2v  08/08/2012  *RADIOLOGY REPORT*  Clinical Data: Evaluate small bowel obstruction.  Question worsening.  PORTABLE ABDOMEN - 2 VIEW  Comparison: 08/07/2012  Findings: Nasogastric tube tip overlies the level of the stomach. There is persistent significant dilatation of small bowel loops. No free intraperitoneal air on left lateral decubitus view.  There is persistent contrast, likely within the colon, showing little interval progression since previous exam.  IMPRESSION: Persistent high-grade small bowel obstruction.  Original Report Authenticated By: Patterson Hammersmith, M.D.   Dg Abd Portable 2v  08/07/2012  *RADIOLOGY REPORT*  Clinical Data: Abdominal distention and decreased bowel sounds.  PORTABLE ABDOMEN - 2 VIEW  Comparison: CT abdomen and pelvis 08/04/2012.  Findings: Multiple dilated loops of small bowel measuring up to 5.8 cm are identified.  No free intraperitoneal air is identified.  IMPRESSION: Bowel gas pattern compatible with small bowel obstruction.  Original Report Authenticated By: Bernadene Bell. D'ALESSIO, M.D.    MEDICATIONS: Scheduled Meds:   . insulin aspart  0-9 Units Subcutaneous Q4H  . LORazepam  0.5 mg Intravenous TID  . pantoprazole  40 mg Oral Q1200  . sodium chloride  10-40 mL Intracatheter Q12H  . sodium glycerophosphate 0.9% NaCl IVPB  20 mmol Intravenous Once  . tiotropium  18 mcg Inhalation Daily   Continuous Infusions:   . fat emulsion 240 mL (08/16/12 1818)  . TPN (CLINIMIX) +/- additives 40 mL/hr at 08/16/12 1817  . TPN (CLINIMIX) +/- additives 60 mL/hr at 08/17/12 2117  . DISCONTD: heparin Stopped (08/17/12 1420)    PRN Meds:.acetaminophen, iohexol, morphine injection, ondansetron (ZOFRAN) IV, promethazine, sodium chloride  Antibiotics: Anti-infectives     Start     Dose/Rate Route Frequency Ordered Stop   08/11/12 1200   cefTRIAXone (ROCEPHIN) 1 g in dextrose 5 % 50 mL IVPB  Status:  Discontinued        1 g 100 mL/hr over 30 Minutes Intravenous Every 24 hours 08/11/12 1137 08/14/12 1351   08/07/12 0930   cefTRIAXone (ROCEPHIN) 1 g in dextrose 5 % 50 mL IVPB  Status:  Discontinued        1 g 100 mL/hr over 30 Minutes Intravenous Every 24 hours 08/07/12 0845 08/10/12 2310           Jeoffrey Massed, MD  [facility] Pager:[phone]  If 7PM-7AM, please contact  night-coverage [email or web address] Password [email or web address] 08/18/2012, 12:14 PM   LOS: 12 days

## 2012-08-18 NOTE — Progress Notes (Signed)
Definity test given per echo tech instructions. 2 cc of contrast given over 15 seconds and repeated x 1.  Pt tolerated well no c/o.   Rosalie Doctor

## 2012-08-18 NOTE — Progress Notes (Signed)
Clinical Social Worker followed up with pt son via telephone to discuss SNF placement. Pt son stated that he had not yet visited SNF and that he had to discuss with pt sister and MD. Clinical Social Worker encouraged pt son to discuss with pt daughter as soon as possible as pt is progressing medically and nearing being medically ready for discharge. Pt son asked this Clinical Social Worker to contact pt daughter. Clinical Social Worker has not yet received return phone call from pt daughter from message left yesterday. Clinical Social Worker contacted pt daughter via telephone and unable to reach pt daughter, voice message left. Clinical Social Worker to continue to follow to get decision for SNF placement from pt family. Clinical Social Worker to facilitate pt discharge needs when pt medically ready for discharge.  Jacklynn Lewis, MSW, LCSWA  Clinical Social Work [phone]

## 2012-08-18 NOTE — Progress Notes (Addendum)
Clinical Social Worker contacted pt son to discuss SNF bed offers. Clinical Social Worker provided bed offers and clarified pt son's questions in regard to SNF. Pt only bed offer is [facility] and [facility] and [facility] considering. Pt son request this Clinical Social Worker to contact [facility] to determine whether facility is able to offer a bed. Pt son stated that he needed to discuss with pt daughter and Clinical Social Worker encouraged pt son to visit facilities today. Clinical Social Worker contacted [facility] to notify of pt family interest in facility and [facility] reviewing and will notify this Clinical Social Worker of their decision. Clinical Social Worker to follow up with pt family in regard to decision for SNF. Clinical Social Worker to continue to follow.  Jacklynn Lewis, MSW, LCSWA  Clinical Social Work [phone]

## 2012-08-19 DIAGNOSIS — F329 Major depressive disorder, single episode, unspecified: Secondary | ICD-10-CM

## 2012-08-19 LAB — CBC
HCT: 24.9 % — ABNORMAL LOW (ref 36.0–46.0)
Hemoglobin: 7.9 g/dL — ABNORMAL LOW (ref 12.0–15.0)
MCH: 28.5 pg (ref 26.0–34.0)
RBC: 2.77 MIL/uL — ABNORMAL LOW (ref 3.87–5.11)

## 2012-08-19 LAB — COMPREHENSIVE METABOLIC PANEL
ALT: 77 U/L — ABNORMAL HIGH (ref 0–35)
Alkaline Phosphatase: 92 U/L (ref 39–117)
BUN: 19 mg/dL (ref 6–23)
CO2: 25 mEq/L (ref 19–32)
Calcium: 8.6 mg/dL (ref 8.4–10.5)
GFR calc Af Amer: 80 mL/min — ABNORMAL LOW (ref >90)
GFR calc non Af Amer: 69 mL/min — ABNORMAL LOW (ref >90)
Glucose, Bld: 136 mg/dL — ABNORMAL HIGH (ref 70–99)
Potassium: 3.8 mEq/L (ref 3.5–5.1)
Sodium: 140 mEq/L (ref 135–145)

## 2012-08-19 LAB — GLUCOSE, CAPILLARY: Glucose-Capillary: 132 mg/dL — ABNORMAL HIGH (ref 70–99)

## 2012-08-19 MED ORDER — ENSURE COMPLETE PO LIQD
237.0000 mL | Freq: Two times a day (BID) | ORAL | Status: DC
  Administered 2012-08-19 – 2012-08-20 (×3): 237 mL via ORAL

## 2012-08-19 MED ORDER — POTASSIUM & SODIUM PHOSPHATES 280-160-250 MG PO PACK
1.0000 | PACK | Freq: Two times a day (BID) | ORAL | Status: AC
  Administered 2012-08-19 – 2012-08-20 (×4): 1 via ORAL
  Filled 2012-08-19 (×4): qty 1

## 2012-08-19 MED ORDER — OXYCODONE-ACETAMINOPHEN 5-325 MG PO TABS
1.0000 | ORAL_TABLET | ORAL | Status: DC | PRN
  Administered 2012-08-19 (×2): 2 via ORAL
  Filled 2012-08-19 (×2): qty 2

## 2012-08-19 MED ORDER — ALPRAZOLAM 0.5 MG PO TABS
1.0000 mg | ORAL_TABLET | Freq: Three times a day (TID) | ORAL | Status: DC
  Administered 2012-08-19 – 2012-08-20 (×4): 1 mg via ORAL
  Filled 2012-08-19 (×4): qty 1
  Filled 2012-08-19: qty 2
  Filled 2012-08-19 (×2): qty 1

## 2012-08-19 NOTE — Progress Notes (Signed)
PT Cancellation Note  Treatment cancelled today due to patient's refusal to participate.Pt refusing to perform any mobility despite encouragement and reassurance. Pt perseverating on LLE pain yet when moved in bed for SCDs no report of pain. Nursing staff aware. Will attempt at later date.  Toney Sang Beth 08/19/2012, 3:10 PM Delaney Meigs, PT [phone]

## 2012-08-19 NOTE — Progress Notes (Signed)
Nutrition Follow-up  Intervention:   1. Recommend continue TPN until pt is tolerating full diet 2. Ensure Complete po BID, each supplement provides 350 kcal and 13 grams of protein. 3. RD will continue to follow    Assessment:   Remains on TPN, transitioning to full liquids. Once on full liquids TPN to be d/c'd.  Pt with some electrolyte abnormalities, levels were repleted by MD.  Today is day 4 after starting TPN, electrolytes likely stable.   Pt reports eating chicken soup and yogurt today, denies abdominal pain. Encouraged to eat as able as TPN would be soon off. Pt wanted a sandwich.    Diet Order:  Full Liquid PO intake: not documented  Supplements:  none Patient is receiving TPN with Clinimix E 5/15 @ 35 ml/hr.  Lipids (20% IVFE @ 10 ml/hr), multivitamins, and trace elements are provided 3 times weekly (MWF) due to national backorder.  Provides 801 kcal and 42 grams protein daily (based on weekly average).  Meets 53% minimum estimated kcal and 53% minimum estimated protein needs.   Meds: Scheduled Meds:   . enoxaparin (LOVENOX) injection  40 mg Subcutaneous Q24H  . LORazepam  0.5 mg Intravenous TID  . pantoprazole  40 mg Oral Q1200  . potassium & sodium phosphates  1 packet Oral BID AC  . sodium chloride  10-40 mL Intracatheter Q12H  . tiotropium  18 mcg Inhalation Daily  . DISCONTD: insulin aspart  0-9 Units Subcutaneous Q4H   Continuous Infusions:   . fat emulsion 240 mL (08/18/12 1743)  . TPN (CLINIMIX) +/- additives 60 mL/hr at 08/17/12 2117  . TPN (CLINIMIX) +/- additives 35 mL/hr at 08/19/12 0855   PRN Meds:.acetaminophen, morphine injection, ondansetron (ZOFRAN) IV, oxyCODONE-acetaminophen, promethazine, sodium chloride  Labs:  CMP     Component Value Date/Time   NA 140 08/19/2012 0540   K 3.8 08/19/2012 0540   CL 110 08/19/2012 0540   CO2 25 08/19/2012 0540   GLUCOSE 136* 08/19/2012 0540   BUN 19 08/19/2012 0540   CREATININE 0.82 08/19/2012 0540   CALCIUM 8.6  08/19/2012 0540   PROT 5.3* 08/19/2012 0540   ALBUMIN 1.7* 08/19/2012 0540   AST 63* 08/19/2012 0540   ALT 77* 08/19/2012 0540   ALKPHOS 92 08/19/2012 0540   BILITOT 0.3 08/19/2012 0540   GFRNONAA 69* 08/19/2012 0540   GFRAA 80* 08/19/2012 0540  . Sodium  Date/Time Value Range Status  08/19/2012  5:40 AM 140  135 - 145 mEq/L Final  08/18/2012 10:25 AM 143  135 - 145 mEq/L Final  08/17/2012  5:43 AM 145  135 - 145 mEq/L Final    Potassium  Date/Time Value Range Status  08/19/2012  5:40 AM 3.8  3.5 - 5.1 mEq/L Final  08/18/2012 10:25 AM 4.0  3.5 - 5.1 mEq/L Final  08/17/2012  5:43 AM 4.0  3.5 - 5.1 mEq/L Final    Phosphorus  Date/Time Value Range Status  08/19/2012  5:40 AM 2.0* 2.3 - 4.6 mg/dL Final  7/82/9562 13:08 AM 2.4  2.3 - 4.6 mg/dL Final  6/57/8469  6:29 AM 2.0* 2.3 - 4.6 mg/dL Final    Magnesium  Date/Time Value Range Status  08/19/2012  5:40 AM 1.8  1.5 - 2.5 mg/dL Final  05/19/4131  4:40 AM 1.9  1.5 - 2.5 mg/dL Final  12/24/7251  6:64 AM 2.2  1.5 - 2.5 mg/dL Final       Intake/Output Summary (Last 24 hours) at 08/19/12 1408 Last data filed at  08/19/12 0900  Gross per 24 hour  Intake    440 ml  Output      0 ml  Net    440 ml    Weight Status:  196 lbs, trending up from admission   Re-estimated needs:  1500-1700 kcal, 80-90 gm protein   Nutrition Dx:  Inadequate oral intake r/t altered GI function AEB NPO diet status, improving  Goal:  TPN to meet >/=90% estimated nutrition needs until able to tolerate adequate PO intake, met   Monitor:  Diet advance, intake, TPN, labs, weight    Clarene Duke RD, LDN Pager [phone] After Hours pager [phone]

## 2012-08-19 NOTE — Progress Notes (Signed)
Patient ID: [name] [name], female   DOB: 06-04-1935, 77 y.o.   MRN: [id number]    Subjective: Awake and alert this morning; no new c/o +BM, flatus tolerated sips of clears.  Objective: Vital signs in last 24 hours: Temp:  [98 F (36.7 C)-98.8 F (37.1 C)] 98.3 F (36.8 C) (08/29 0451) Pulse Rate:  [69-77] 74  (08/29 0451) Resp:  [20-22] 20  (08/29 0451) BP: (95-117)/(61-70) 117/62 mmHg (08/29 0451) SpO2:  [96 %-97 %] 97 % (08/29 0451) Weight:  [196 lb 10.4 oz (89.2 kg)] 196 lb 10.4 oz (89.2 kg) (08/29 0453) Last BM Date: 08/18/12  Intake/Output from previous day: 08/28 0701 - 08/29 0700 In: 980 [P.O.:560; TPN:420] Out: -  Intake/Output this shift:    General appearance: alert, cooperative, appears stated age and no distress Chest: CTA bilaterally Cardiac: RRR, no M/R/G Abdomen: soft, diffusely tender, Minimal BS, + flatus , BM per patient. Confirmed by nursing. WBCremains wnl Hgb stable (medicine Team monitoring) currently at 7.9  Lab Results:   Basename 08/19/12 0540 08/18/12 0500  WBC 9.8 10.1  HGB 7.9* 7.8*  HCT 24.9* 24.9*  PLT 250 237   BMET  Basename 08/19/12 0540 08/18/12 1025  NA 140 143  K 3.8 4.0  CL 110 111  CO2 25 25  GLUCOSE 136* 128*  BUN 19 21  CREATININE 0.82 0.88  CALCIUM 8.6 8.5   PT/INR No results found for this basename: LABPROT:2,INR:2 in the last 72 hours ABG No results found for this basename: PHART:2,PCO2:2,PO2:2,HCO3:2 in the last 72 hours  Studies/Results: Ct Angio Chest Pe W/cm &/or Wo Cm  08/17/2012  *RADIOLOGY REPORT*  Clinical Data: Possible right ventricular outflow tract thrombus noted on recent two-dimensional echocardiogram.  Evaluate for potential RVOT thrombus or pulmonary embolism.  CT ANGIOGRAPHY CHEST  Technique:  Multidetector CT imaging of the chest using the standard protocol during bolus administration of intravenous contrast. Multiplanar reconstructed images including MIPs were obtained and reviewed to evaluate the  vascular anatomy.  Contrast: OMNIPAQUE IOHEXOL 350 MG/ML SOLN  Comparison: Chest c t 01/18/2007.  Findings:  Mediastinum: Study is severely limited by patient respiratory motion.  Because of this, accurate assessment for distal segmental and subsegmental sized emboli is not possible on this examination. Additionally, in the right lower lobe, even proximal segmental sized emboli cannot be excluded.  With these limitations in mind, there is no evidence of central, lobe are or proximal segmental sized embolism.  Additionally, there is no filling defects identified within the right ventricle, or the right ventricular outflow tract to suggest thrombus. Heart size is normal. There is no significant pericardial fluid, thickening or pericardial calcification. There is atherosclerosis of the thoracic aorta, the great vessels of the mediastinum and the coronary arteries, including calcified atherosclerotic plaque in the left main, left anterior descending, left circumflex and right coronary arteries. Extensive atheromatous plaque is identified throughout the descending thoracic aorta, with multiple extensions impinging upon the lumen of the aorta (this may be increased risk for embolic event associated with any future catheterization attempts). A nasogastric tube extends to the gastroesophageal junction. Esophagus is unremarkable in appearance. No pathologically enlarged mediastinal or hilar lymph nodes. Right upper extremity PICC is in place with the tip coiled in the right subclavian vein adjacent to the junction with the right internal jugular vein.  Lungs/Pleura: No consolidative airspace disease.  No pleural effusions.  Extensive respiratory motion limits sensitivity for detection of small pulmonary nodules.  No large suspicious appearing pulmonary nodules  or masses are confidently identified.  Upper Abdomen: Unremarkable.  Musculoskeletal: There are no aggressive appearing lytic or blastic lesions noted in the  visualized portions of the skeleton.  IMPRESSION: 1.  Exceedingly limited examination secondary to respiratory motion with no obvious pulmonary embolism, as above. 2.  No filling defect in the right ventricular outflow tract to suggest RVOT thrombus. 3.  The patient's right upper extremity PICC is coiled upon itself with the tip in the subclavian vein adjacent to the junction with the right internal jugular vein. 4. Severe atherosclerosis, including left main and three-vessel coronary artery disease.  Assessment for potential risk factor modification, dietary therapy or pharmacologic therapy may be warranted, if clinically indicated. 5.  Other incidental findings, as above.   Original Report Authenticated By: Florencia Reasons, M.D.    Dg Chest Port 1 View  08/17/2012  *RADIOLOGY REPORT*  Clinical Data: PICC repositioning.  PORTABLE CHEST - 1 VIEW  Comparison: CT scan dated 08/17/2012 and chest x-ray dated 05/23/2012  Findings: PICC line is looped in the right subclavian vein and the tip is in the right subclavian vein.  NG tube tip is below the diaphragm.  Proximal side hole is in the distal esophagus.  Heart size and vascularity are normal and the lungs are clear. Slight thoracic scoliosis.  IMPRESSION: PICC line and and the its tip are looped in the right subclavian vein.  Clear lungs.   Original Report Authenticated By: Gwynn Burly, M.D.     Anti-infectives: Anti-infectives     Start     Dose/Rate Route Frequency Ordered Stop   08/11/12 1200   cefTRIAXone (ROCEPHIN) 1 g in dextrose 5 % 50 mL IVPB  Status:  Discontinued        1 g 100 mL/hr over 30 Minutes Intravenous Every 24 hours 08/11/12 1137 08/14/12 1351   08/07/12 0930   cefTRIAXone (ROCEPHIN) 1 g in dextrose 5 % 50 mL IVPB  Status:  Discontinued        1 g 100 mL/hr over 30 Minutes Intravenous Every 24 hours 08/07/12 0845 08/10/12 2310          Assessment/Plan: s/p * No surgery found *  1. Will advance diet to full  liquids. 2. CSW note is seen and appreciated, agree with SNF placement post discharge.   LOS: 13 days    Karesha Trzcinski 08/19/2012

## 2012-08-19 NOTE — Progress Notes (Signed)
PARENTERAL NUTRITION CONSULT NOTE - Follow-Up  Pharmacy Consult for TPN Indication:Prolonged ileus   Allergies  Allergen Reactions  . Asa (Aspirin) Other (See Comments)    "my ears bleed"  . Lidocaine Other (See Comments)    unknown  . Nsaids Other (See Comments)    unknown  . Sulfonamide Derivatives Other (See Comments)    unknown    Patient Measurements: Height: 5\' 4"  (162.6 cm) Weight: 196 lb 10.4 oz (89.2 kg) IBW/kg (Calculated) : 54.7  Adjusted Body Weight: 60Kg Usual Weight: 199 lb  Vital Signs: Temp: 98.3 F (36.8 C) (08/29 0451) Temp src: Oral (08/29 0451) BP: 117/62 mmHg (08/29 0451) Pulse Rate: 74  (08/29 0451) Intake/Output from previous day: 08/28 0701 - 08/29 0700 In: 980 [P.O.:560; TPN:420] Out: -  Intake/Output from this shift:    Labs:  Basename 08/19/12 0540 08/18/12 0500 08/17/12 0543  WBC 9.8 10.1 11.3*  HGB 7.9* 7.8* 8.2*  HCT 24.9* 24.9* 25.3*  PLT 250 237 219  APTT -- -- --  INR -- -- --     Basename 08/19/12 0540 08/18/12 1025 08/17/12 0543 08/16/12 0938  NA 140 143 145 --  K 3.8 4.0 4.0 --  CL 110 111 114* --  CO2 25 25 24  --  GLUCOSE 136* 128* 118* --  BUN 19 21 27* --  CREATININE 0.82 0.88 0.95 --  LABCREA -- -- -- --  CREAT24HRUR -- -- -- --  CALCIUM 8.6 8.5 8.3* --  MG 1.8 -- 1.9 2.2  PHOS 2.0* 2.4 2.0* --  PROT 5.3* -- -- 5.1*  ALBUMIN 1.7* -- -- 1.7*  AST 63* -- -- 75*  ALT 77* -- -- 82*  ALKPHOS 92 -- -- 86  BILITOT 0.3 -- -- 0.5  BILIDIR -- -- -- --  IBILI -- -- -- --  PREALBUMIN -- -- 7.1* 7.3*  TRIG -- -- 218* 190*  CHOLHDL -- -- -- --  CHOL -- -- -- 90   Estimated Creatinine Clearance: 65.1 ml/min (by C-G formula based on Cr of 0.82).    Basename 08/19/12 0025 08/18/12 2001 08/18/12 1746  GLUCAP 132* 136* 111*   Insulin Requirements in the past 24 hours:  5 units Novolog SSI  Current Nutrition:  Clinimix E 5/15 at 70 ml/hr + lipids 81ml/hr M/W/F provides daily average of 1684 kcal and 84 gm  protein. Diet: Full liquid  Nutritional Goals per RD:  1500-1700 kCal, 80-90 grams of protein per day  Assessment: Ms. Bruhl was admitted 8/16 from [facility] d/t low BP, abd pain and UTI. She has a known AAA for which there are no surgical plans presently.   GI: NGT d/c and pt tolerated clear liquid diet yesterday. Noted advancement to full liquid diet today. Md note with recommendations to wean TPN. Last BM 8/28.  Endo: No prior hx of DM. CBGs well controlled- requiring very little SSI. TSH this admit was WNL. Lytes: Phos 2 today - with pt eating will replace with Neutraphos BID x 2 days. K 3.8 (slightly below goal). Goal with ileus is ~4 for K and ~2 for Mg to help with GI peristalsis.  Renal: Scr is normal, UOP not being measured accurately. Cards: BP low-nl. HR ok. Hepatobil: LFTs are ~2x ULN, Tbil WNL. Alb low at 1.7. Prealbumin low 7.3. TG 218 - will watch closely. ID: Afeb. WBC back to wnl. no abx on board. (s/p Rocephin x 7 days) Anticoagulation: Heparin gtt d/c 8/28 after CTA chest neg for RVOT. Best  Practices: PO PPI Central access: PICC   Plan:  - Wean TPN per Dr. Corliss Skains note so will decrease Clinimix E 5/20 to 35 ml/hr + lipids 32ml/hr - both will d/c tonight at 1800. - Will give Phos-NAK packets po BID x 2 days - Will d/c TPN labs and protocol, CBGs and SSI  Thanks, Christoper Fabian, PharmD, BCPS Clinical pharmacist, pager [phone] 08/19/2012 8:50 AM

## 2012-08-19 NOTE — Progress Notes (Signed)
Clinical Social Worker continuing to follow for discharge planning. Clinical Social Worker attempted to meet with pt at bedside to discuss, but pt sleeping at this time and per RN, pt experiencing some confusion. Clinical Social Worker received return phone call from pt daughter and discussed bed offers with pt daughter and pt family choose bed at [facility]. Clinical Social Worker notified facility and confirmed facility able to accept pt when pt medically stable for discharge. Per MD, anticipate pt will be medically stable for discharge Friday 8/30. Clinical Social Worker to assist with pt discharge needs.  Jacklynn Lewis, MSW, LCSWA  Clinical Social Work [phone]

## 2012-08-19 NOTE — Progress Notes (Signed)
Agree with above.  Wean TNA Advance diet Placement.  Wilmon Arms. Corliss Skains, MD, [facility]  08/19/2012 8:46 AM

## 2012-08-19 NOTE — Progress Notes (Signed)
PATIENT DETAILS Name: [name] [name] Age: 77 y.o. Sex: female Date of Birth: 1935-06-13 Admit Date: 08/06/2012 Admitting Physician Renae Fickle, MD AVW:[id number] L, MD  Subjective:  BM yesterday and today  Assessment/Plan: Principal Problem:  *Ileus -per CCS -Now on Full liquids -c/w TNA for now-but will d/c later today  Active Problems: Acute renal failure -Resolved. - Suspect secondary to diuretics (BUN/creatinine pattern prerenal), possible component of Voltaren gel. Creatinine was high 8/13 (see [facility] records). CT abd/pelvis on 8/14 done without IV contrast (oral only) secondary to high creatinine. No CT was performed at [facility]. No history of IV dye allergy or contrast-induced injury.  Hypernatremia- -Resolved, likely secondary to IVF in setting of minimal PO intake (hyperchloremia noted as well).  Hypokalemia- -repleted. Magnesium repleted. Phosphorus improved, repletion per pharmacy.  -monitor periodically  Acute encephalopathy- - resolved-now awake and alert - Secondary to narcotic & BZD withdrawal. Developed suddenly 8/21 (all previous notes report alert, normal mental status). Of note, as outpatient was on scheduled Lortab TID and Xanax TID as outpatient (Xanax for 20 years per family), neither continued on admission. No listed history of dementia, not on any medications for dementia or mood disorder as an outpatient. Exam non-focal, infectious workup negative. Head CT negative.   Possible right ventricular outflow tract obstruction/thrombus- -Echo notable for possible RVOT thrombus--unable to get VQ scan secondary to patient's agitation.  CTA chest on 8/27-negative for RVOT-therefore heparin stopped. LE dopplers negative.Awaiting repeat Echo.  UTI- -treated with 8 days Rocephin, no culture on admission. Patient had already been given antibiotics, therefore later culture unreliable. -currently not on any antibiotics  Hypotension--resolved.  Multifactorial--diuretic-inducted dehydraton coupled with anti-hypertenisves in setting of ARF.   H/o HTN -previously as outpatient on numerous anti-hypertensives- all on hold due to hypotension during early hospital cours -BP controlled without need for anti-hypertensive medications  Dysphagia- -TNA for now. -seen by ST 8/27-will advance to Dys 2 diet when clinically ready  Anemia of acute illness- -Slowly trending downwards, follow with daily CBC. Transfuse for Hgb <7.  Elevated transaminases- -Improvement continues. AP & bilirubin normal. Possibly secondary to Crestor vs hypotension -monitor periodically  SVT 8/21- -no recurrence. Cardiac enzymes negative. Monitor.  AAA- -Followed by Dr. Calla Kicks in 6 months. CT 8/14--stable 5.4 cm infrarenal aneurysm w/o leak or rupture   Depression with anxiety- -continue BZD  COPD- -Stable currently  -c/w Spiriva  Chronic hip pain- -by history too high risk for hip surgery. Morphine for pain control. -will start oral narcotics as needed for moderate pain   RBBB--chronic.  Disposition: Remain inpatient-SNF tomorrow  DVT Prophylaxis: Prophylactic Lovenox   Code Status: Full code   Family Communication -left voicemail for son and daughter, awaiting to hear back  CONSULTS:  general surgery  PHYSICAL EXAM: Vital signs in last 24 hours: Filed Vitals:   08/18/12 2308 08/19/12 0451 08/19/12 0453 08/19/12 0852  BP: 113/70 117/62    Pulse: 74 74    Temp: 98.8 F (37.1 C) 98.3 F (36.8 C)    TempSrc: Oral Oral    Resp: 20 20    Height:      Weight:   89.2 kg (196 lb 10.4 oz)   SpO2: 96% 97%  96%    Weight change: -0.5 kg (-1 lb 1.6 oz) Body mass index is 33.75 kg/(m^2).   Gen Exam: Awake and alert with clear speech.   Neck: Supple, No JVD.   Chest: B/L Clear.   CVS: S1 S2 Regular, no murmurs.  Abdomen: soft, BS +,  non tender, non distended.  Extremities: no edema, lower extremities warm to  touch. Neurologic: Non Focal.   Skin: No Rash.   Wounds: N/A.    Intake/Output from previous day:  Intake/Output Summary (Last 24 hours) at 08/19/12 1033 Last data filed at 08/19/12 0900  Gross per 24 hour  Intake   1220 ml  Output      0 ml  Net   1220 ml     LAB RESULTS: CBC  Lab 08/19/12 0540 08/18/12 0500 08/17/12 0543 08/16/12 0938 08/15/12 0135  WBC 9.8 10.1 11.3* 14.7* 16.2*  HGB 7.9* 7.8* 8.2* 9.2* 10.1*  HCT 24.9* 24.9* 25.3* 28.7* 31.2*  PLT 250 237 219 185 174  MCV 89.9 89.6 88.8 87.8 86.2  MCH 28.5 28.1 28.8 28.1 27.9  MCHC 31.7 31.3 32.4 32.1 32.4  RDW 16.2* 15.9* 16.0* 15.8* 15.6*  LYMPHSABS -- -- -- 2.4 --  MONOABS -- -- -- 1.0 --  EOSABS -- -- -- 0.1 --  BASOSABS -- -- -- 0.1 --  BANDABS -- -- -- -- --    Chemistries   Lab 08/19/12 0540 08/18/12 1025 08/17/12 0543 08/16/12 0938 08/15/12 0135  NA 140 143 145 142 142  K 3.8 4.0 4.0 4.0 2.9*  CL 110 111 114* 111 107  CO2 25 25 24 24 25   GLUCOSE 136* 128* 118* 151* 132*  BUN 19 21 27* 33* 36*  CREATININE 0.82 0.88 0.95 1.01 1.02  CALCIUM 8.6 8.5 8.3* 8.4 8.8  MG 1.8 -- 1.9 2.2 1.3*    CBG:  Lab 08/19/12 0025 08/18/12 2001 08/18/12 1746 08/18/12 1223 08/18/12 0744  GLUCAP 132* 136* 111* 134* 128*    GFR Estimated Creatinine Clearance: 65.1 ml/min (by C-G formula based on Cr of 0.82).  Coagulation profile No results found for this basename: INR:5,PROTIME:5 in the last 168 hours  Cardiac Enzymes No results found for this basename: CK:3,CKMB:3,TROPONINI:3,MYOGLOBIN:3 in the last 168 hours  No components found with this basename: POCBNP:3 No results found for this basename: DDIMER:2 in the last 72 hours No results found for this basename: HGBA1C:2 in the last 72 hours  Basename 08/17/12 0543  CHOL --  HDL --  LDLCALC --  TRIG 218*  CHOLHDL --  LDLDIRECT --   No results found for this basename: TSH,T4TOTAL,FREET3,T3FREE,THYROIDAB in the last 72 hours No results found for this  basename: VITAMINB12:2,FOLATE:2,FERRITIN:2,TIBC:2,IRON:2,RETICCTPCT:2 in the last 72 hours No results found for this basename: LIPASE:2,AMYLASE:2 in the last 72 hours  Urine Studies No results found for this basename: UACOL:2,UAPR:2,USPG:2,UPH:2,UTP:2,UGL:2,UKET:2,UBIL:2,UHGB:2,UNIT:2,UROB:2,ULEU:2,UEPI:2,UWBC:2,URBC:2,UBAC:2,CAST:2,CRYS:2,UCOM:2,BILUA:2 in the last 72 hours  MICROBIOLOGY: No results found for this or any previous visit (from the past 240 hour(s)).  RADIOLOGY STUDIES/RESULTS: Ct Abdomen Pelvis Wo Contrast  08/04/2012  *RADIOLOGY REPORT*  Clinical Data: Follow-up abdominal aortic aneurysm.  CT ABDOMEN AND PELVIS WITHOUT CONTRAST  Technique:  Multidetector CT imaging of the abdomen and pelvis was performed following the standard protocol without intravenous contrast.  Comparison: 12/04/2011  Findings: Infrarenal abdominal aortic aneurysm measures 5.4 x 5.4 cm, without significant change in size since previous study.  No evidence of aneurysm leak or rupture.  No evidence of iliac artery aneurysm.  Tiny nonobstructing left intrarenal calculus again noted measuring less than 5 mm.  Bilateral renal parenchymal atrophy again demonstrated, however there is no evidence of renal mass or hydronephrosis.  The other abdominal parenchymal organs have a normal appearance on this noncontrast study. Small calcified gallstones seen in the gallbladder fundus, however there is no evidence of  cholecystitis.  Uterus and adnexa are unremarkable in appearance.  No soft tissue masses or lymphadenopathy identified.  No evidence of inflammatory process or abnormal fluid collections.  Severe bilateral hip DJD also noted with chronic collapse of the articular surface of both femoral heads.  IMPRESSION:  1.  Stable 5.4 cm infrarenal abdominal aortic aneurysm.  No evidence of aneurysm leak or rupture. 2.  Nonobstructing left nephrolithiasis and cholelithiasis.  Original Report Authenticated By: Danae Orleans, M.D.    Dg Abd 1 View  08/14/2012  *RADIOLOGY REPORT*  Clinical Data: Abdominal distention.  ABDOMEN - 1 VIEW  Comparison: 08/11/2012  Findings: Supine images of the abdomen demonstrate gaseous distention of the stomach.  There are still gas-filled loops of small bowel.  Overall, there is decreased small bowel distention. Again noted is sclerosis and abnormality of the femoral heads. There is high-density material contrast within the rectum.  The nasogastric tube has been removed.  Large gas-filled structure in the right lower quadrant is nonspecific and could be associated with the cecum. Limited evaluation for free air on the supine images.  IMPRESSION: Decreased small bowel gaseous distention.  Slightly increased gaseous distention of the stomach since removal of the nasogastric tube.   Original Report Authenticated By: Richarda Overlie, M.D.    Ct Head Wo Contrast  08/15/2012  *RADIOLOGY REPORT*  Clinical Data: Acute encephalopathy  CT HEAD WITHOUT CONTRAST  Technique:  Contiguous axial images were obtained from the base of the skull through the vertex without contrast.  Comparison: 09/11/2008  Findings: Motion degraded images.  No evidence of parenchymal hemorrhage or extra-axial fluid collection. No mass lesion, mass effect, or midline shift.  No CT evidence of acute infarction.  Subcortical white matter and periventricular small vessel ischemic changes.  Intracranial atherosclerosis.  Global cortical atrophy.  No ventriculomegaly.  The visualized paranasal sinuses are essentially clear. The mastoid air cells are unopacified.  No evidence of calvarial fracture.  IMPRESSION: No evidence of acute intracranial abnormality.  Atrophy with small vessel ischemic changes and intracranial atherosclerosis.   Original Report Authenticated By: Charline Bills, M.D.    Ct Angio Chest Pe W/cm &/or Wo Cm  08/17/2012  *RADIOLOGY REPORT*  Clinical Data: Possible right ventricular outflow tract thrombus noted on recent  two-dimensional echocardiogram.  Evaluate for potential RVOT thrombus or pulmonary embolism.  CT ANGIOGRAPHY CHEST  Technique:  Multidetector CT imaging of the chest using the standard protocol during bolus administration of intravenous contrast. Multiplanar reconstructed images including MIPs were obtained and reviewed to evaluate the vascular anatomy.  Contrast: OMNIPAQUE IOHEXOL 350 MG/ML SOLN  Comparison: Chest c t 01/18/2007.  Findings:  Mediastinum: Study is severely limited by patient respiratory motion.  Because of this, accurate assessment for distal segmental and subsegmental sized emboli is not possible on this examination. Additionally, in the right lower lobe, even proximal segmental sized emboli cannot be excluded.  With these limitations in mind, there is no evidence of central, lobe are or proximal segmental sized embolism.  Additionally, there is no filling defects identified within the right ventricle, or the right ventricular outflow tract to suggest thrombus. Heart size is normal. There is no significant pericardial fluid, thickening or pericardial calcification. There is atherosclerosis of the thoracic aorta, the great vessels of the mediastinum and the coronary arteries, including calcified atherosclerotic plaque in the left main, left anterior descending, left circumflex and right coronary arteries. Extensive atheromatous plaque is identified throughout the descending thoracic aorta, with multiple extensions impinging upon the lumen  of the aorta (this may be increased risk for embolic event associated with any future catheterization attempts). A nasogastric tube extends to the gastroesophageal junction. Esophagus is unremarkable in appearance. No pathologically enlarged mediastinal or hilar lymph nodes. Right upper extremity PICC is in place with the tip coiled in the right subclavian vein adjacent to the junction with the right internal jugular vein.  Lungs/Pleura: No consolidative  airspace disease.  No pleural effusions.  Extensive respiratory motion limits sensitivity for detection of small pulmonary nodules.  No large suspicious appearing pulmonary nodules or masses are confidently identified.  Upper Abdomen: Unremarkable.  Musculoskeletal: There are no aggressive appearing lytic or blastic lesions noted in the visualized portions of the skeleton.  IMPRESSION: 1.  Exceedingly limited examination secondary to respiratory motion with no obvious pulmonary embolism, as above. 2.  No filling defect in the right ventricular outflow tract to suggest RVOT thrombus. 3.  The patient's right upper extremity PICC is coiled upon itself with the tip in the subclavian vein adjacent to the junction with the right internal jugular vein. 4. Severe atherosclerosis, including left main and three-vessel coronary artery disease.  Assessment for potential risk factor modification, dietary therapy or pharmacologic therapy may be warranted, if clinically indicated. 5.  Other incidental findings, as above.   Original Report Authenticated By: Florencia Reasons, M.D.    Dg Chest Port 1 View  08/17/2012  *RADIOLOGY REPORT*  Clinical Data: PICC repositioning.  PORTABLE CHEST - 1 VIEW  Comparison: CT scan dated 08/17/2012 and chest x-ray dated 05/23/2012  Findings: PICC line is looped in the right subclavian vein and the tip is in the right subclavian vein.  NG tube tip is below the diaphragm.  Proximal side hole is in the distal esophagus.  Heart size and vascularity are normal and the lungs are clear. Slight thoracic scoliosis.  IMPRESSION: PICC line and and the its tip are looped in the right subclavian vein.  Clear lungs.   Original Report Authenticated By: Gwynn Burly, M.D.    Dg Abd 2 Views  08/11/2012  *RADIOLOGY REPORT*  Clinical Data: Small bowel obstruction  ABDOMEN - 2 VIEW  Comparison: 08/10/2012  Findings: Persistent distended small bowel loops with air-fluid levels consistent with small bowel  obstruction.  No free abdominal air.  IMPRESSION:  Again noted distended small bowel loops with air-fluid levels consistent with small bowel obstruction.   Original Report Authenticated By: Natasha Mead, M.D.    Dg Abd 2 Views  08/10/2012  *RADIOLOGY REPORT*  Clinical Data: Abdominal distention, nausea.  ABDOMEN - 2 VIEW  Comparison:   the previous day's study  Findings: No free air on the left lateral decubitus radiograph. Multiple dilated small bowel loops throughout the abdomen with fluid levels on the decubitus film.  Residual contrast in the decompressed colon. Nasogastric tube extends into the nondilated stomach.  IMPRESSION:  1.  Persistent   small bowel obstruction.  No free air.   Original Report Authenticated By: Osa Craver, M.D.    Dg Abd Portable 1v  08/09/2012  *RADIOLOGY REPORT*  Clinical Data: Abdominal distention  PORTABLE ABDOMEN - 1 VIEW  Comparison: 08/18  Findings: There is persistent small bowel dilatation, similar to the previous exam, consistent with small bowel obstruction.  Small amount of gas present in the colon.  Nasogastric tube has its tip in the stomach.  IMPRESSION: Persistent small bowel obstruction pattern.   Original Report Authenticated By: Thomasenia Sales, M.D. ( 08/09/2012 07:48:04 )  Dg Abd Portable 2v  08/16/2012  *RADIOLOGY REPORT*  Clinical Data: Abdominal pain.  Follow up ileus.  PORTABLE ABDOMEN - 2 VIEW  Comparison: 08/15/2012.  Findings: Nasogastric tube tip is at the gastroesophageal junction. Stomach is decompressed when compared with yesterday's exam.  There is persistent dilatation of bowel in the low central abdomen.  No free air.  Severe degenerative changes are seen in the hips with flattening of the left humeral head.  IMPRESSION: Improving gaseous distention of bowel.   Original Report Authenticated By: Reyes Ivan, M.D.    Dg Abd Portable 2v  08/15/2012  *RADIOLOGY REPORT*  Clinical Data: Evaluate small bowel obstruction or ileus.   PORTABLE ABDOMEN - 2 VIEW  Comparison: 08/14/2012  Findings: Supine and left lower decubitus images were obtained. There is no evidence to suggest free air.  There is gas within the right colon.  There is persistent gaseous distention of the stomach.  There are still dilated gas-filled loops of small bowel. The contrast in the rectum has apparently passed and there is now gas in the rectum.  Chronic changes involving both hips.  IMPRESSION: There is persistent gaseous distention of the stomach and bowel loops.  There appears to be increased gas within the colon. Findings are more suggestive for an ileus pattern rather than an obstruction at this time.   Original Report Authenticated By: Richarda Overlie, M.D.    Dg Abd Portable 2v  08/08/2012  *RADIOLOGY REPORT*  Clinical Data: Evaluate small bowel obstruction.  Question worsening.  PORTABLE ABDOMEN - 2 VIEW  Comparison: 08/07/2012  Findings: Nasogastric tube tip overlies the level of the stomach. There is persistent significant dilatation of small bowel loops. No free intraperitoneal air on left lateral decubitus view.  There is persistent contrast, likely within the colon, showing little interval progression since previous exam.  IMPRESSION: Persistent high-grade small bowel obstruction.  Original Report Authenticated By: Patterson Hammersmith, M.D.   Dg Abd Portable 2v  08/07/2012  *RADIOLOGY REPORT*  Clinical Data: Abdominal distention and decreased bowel sounds.  PORTABLE ABDOMEN - 2 VIEW  Comparison: CT abdomen and pelvis 08/04/2012.  Findings: Multiple dilated loops of small bowel measuring up to 5.8 cm are identified.  No free intraperitoneal air is identified.  IMPRESSION: Bowel gas pattern compatible with small bowel obstruction.  Original Report Authenticated By: Bernadene Bell. D'ALESSIO, M.D.    MEDICATIONS: Scheduled Meds:    . enoxaparin (LOVENOX) injection  40 mg Subcutaneous Q24H  . LORazepam  0.5 mg Intravenous TID  . pantoprazole  40 mg Oral Q1200   . potassium & sodium phosphates  1 packet Oral BID AC  . sodium chloride  10-40 mL Intracatheter Q12H  . tiotropium  18 mcg Inhalation Daily  . DISCONTD: insulin aspart  0-9 Units Subcutaneous Q4H   Continuous Infusions:    . fat emulsion 240 mL (08/18/12 1743)  . TPN (CLINIMIX) +/- additives 60 mL/hr at 08/17/12 2117  . TPN (CLINIMIX) +/- additives 35 mL/hr at 08/19/12 0855   PRN Meds:.acetaminophen, morphine injection, ondansetron (ZOFRAN) IV, promethazine, sodium chloride  Antibiotics: Anti-infectives     Start     Dose/Rate Route Frequency Ordered Stop   08/11/12 1200   cefTRIAXone (ROCEPHIN) 1 g in dextrose 5 % 50 mL IVPB  Status:  Discontinued        1 g 100 mL/hr over 30 Minutes Intravenous Every 24 hours 08/11/12 1137 08/14/12 1351   08/07/12 0930   cefTRIAXone (ROCEPHIN) 1 g in dextrose 5 %  50 mL IVPB  Status:  Discontinued        1 g 100 mL/hr over 30 Minutes Intravenous Every 24 hours 08/07/12 0845 08/10/12 2310           Jeoffrey Massed, MD  [facility] Pager:[phone]  If 7PM-7AM, please contact night-coverage [email or web address] Password [email or web address] 08/19/2012, 10:33 AM   LOS: 13 days

## 2012-08-19 NOTE — Progress Notes (Signed)
Speech Language Pathology Dysphagia Treatment Patient Details Name: [name] [name] MRN: [id number] DOB: October 05, 1935 Today's Date: 08/19/2012 Time: 4098-1191 SLP Time Calculation (min): 17 min  Assessment / Plan / Recommendation Clinical Impression  Pt again demonstrated a normal oral and oropharyngeal function with thin liquids. Provided trials of cracker, pt with prlongued mastication even with dentures in place. Recommend pt initiate a Dys2 diet and thin liquids if ok with MD. SLP will sign off.     Diet Recommendation  Initiate / Change Diet: Dysphagia 2 (fine chop);Thin liquid    SLP Plan All goals met;Discharge SLP treatment due to (comment)   Pertinent Vitals/Pain NA   Swallowing Goals  SLP Swallowing Goals Patient will consume recommended diet without observed clinical signs of aspiration with: Moderate assistance Swallow Study Goal #1 - Progress: Met Patient will utilize recommended strategies during swallow to increase swallowing safety with: Moderate assistance;Supervision/safety Swallow Study Goal #2 - Progress: Met  General    Oral Cavity - Oral Hygiene     Dysphagia Treatment Treatment focused on: Upgraded PO texture trials Treatment Methods/Modalities: Skilled observation Patient observed directly with PO's: Yes Type of PO's observed: Dysphagia 2 (chopped);Thin liquids Feeding: Able to feed self Liquids provided via: Cup;Straw Oral Phase Signs & Symptoms: Prolonged mastication Type of cueing: Verbal   GO     [name] [name], [name] 08/19/2012, 3:10 PM

## 2012-08-20 ENCOUNTER — Inpatient Hospital Stay
Admission: RE | Admit: 2012-08-20 | Discharge: 2012-08-31 | Disposition: A | Discharge status: Nursing Home (Includes ICF) | Payer: Medicaid Other | Source: Ambulatory Visit | Attending: Pulmonary Disease | Admitting: Pulmonary Disease

## 2012-08-20 DIAGNOSIS — I4891 Unspecified atrial fibrillation: Secondary | ICD-10-CM | POA: Diagnosis present

## 2012-08-20 DIAGNOSIS — E785 Hyperlipidemia, unspecified: Secondary | ICD-10-CM | POA: Diagnosis present

## 2012-08-20 DIAGNOSIS — Z87891 Personal history of nicotine dependence: Secondary | ICD-10-CM | POA: Diagnosis not present

## 2012-08-20 DIAGNOSIS — I714 Abdominal aortic aneurysm, without rupture, unspecified: Secondary | ICD-10-CM | POA: Diagnosis not present

## 2012-08-20 DIAGNOSIS — Z87442 Personal history of urinary calculi: Secondary | ICD-10-CM | POA: Diagnosis not present

## 2012-08-20 DIAGNOSIS — K219 Gastro-esophageal reflux disease without esophagitis: Secondary | ICD-10-CM | POA: Diagnosis present

## 2012-08-20 DIAGNOSIS — M1990 Unspecified osteoarthritis, unspecified site: Secondary | ICD-10-CM | POA: Diagnosis not present

## 2012-08-20 DIAGNOSIS — J449 Chronic obstructive pulmonary disease, unspecified: Secondary | ICD-10-CM | POA: Diagnosis not present

## 2012-08-20 DIAGNOSIS — I739 Peripheral vascular disease, unspecified: Secondary | ICD-10-CM | POA: Diagnosis not present

## 2012-08-20 DIAGNOSIS — K56609 Unspecified intestinal obstruction, unspecified as to partial versus complete obstruction: Secondary | ICD-10-CM | POA: Diagnosis not present

## 2012-08-20 DIAGNOSIS — Z882 Allergy status to sulfonamides status: Secondary | ICD-10-CM | POA: Diagnosis not present

## 2012-08-20 DIAGNOSIS — J4489 Other specified chronic obstructive pulmonary disease: Secondary | ICD-10-CM | POA: Diagnosis not present

## 2012-08-20 DIAGNOSIS — F341 Dysthymic disorder: Secondary | ICD-10-CM | POA: Diagnosis present

## 2012-08-20 DIAGNOSIS — I10 Essential (primary) hypertension: Secondary | ICD-10-CM | POA: Diagnosis not present

## 2012-08-20 DIAGNOSIS — N189 Chronic kidney disease, unspecified: Secondary | ICD-10-CM | POA: Diagnosis present

## 2012-08-20 DIAGNOSIS — M1610 Unilateral primary osteoarthritis, unspecified hip: Secondary | ICD-10-CM | POA: Diagnosis present

## 2012-08-20 DIAGNOSIS — R000 Tachycardia, unspecified: Secondary | ICD-10-CM | POA: Diagnosis not present

## 2012-08-20 DIAGNOSIS — I498 Other specified cardiac arrhythmias: Secondary | ICD-10-CM | POA: Diagnosis not present

## 2012-08-20 DIAGNOSIS — I2510 Atherosclerotic heart disease of native coronary artery without angina pectoris: Secondary | ICD-10-CM | POA: Diagnosis present

## 2012-08-20 DIAGNOSIS — I959 Hypotension, unspecified: Secondary | ICD-10-CM | POA: Diagnosis not present

## 2012-08-20 DIAGNOSIS — G9340 Encephalopathy, unspecified: Secondary | ICD-10-CM | POA: Diagnosis not present

## 2012-08-20 DIAGNOSIS — M129 Arthropathy, unspecified: Secondary | ICD-10-CM | POA: Diagnosis present

## 2012-08-20 DIAGNOSIS — I129 Hypertensive chronic kidney disease with stage 1 through stage 4 chronic kidney disease, or unspecified chronic kidney disease: Secondary | ICD-10-CM | POA: Diagnosis present

## 2012-08-20 DIAGNOSIS — I4892 Unspecified atrial flutter: Secondary | ICD-10-CM | POA: Diagnosis present

## 2012-08-20 DIAGNOSIS — Z5189 Encounter for other specified aftercare: Secondary | ICD-10-CM | POA: Diagnosis not present

## 2012-08-20 DIAGNOSIS — Z888 Allergy status to other drugs, medicaments and biological substances status: Secondary | ICD-10-CM | POA: Diagnosis not present

## 2012-08-20 DIAGNOSIS — Z886 Allergy status to analgesic agent status: Secondary | ICD-10-CM | POA: Diagnosis not present

## 2012-08-20 DIAGNOSIS — Z79899 Other long term (current) drug therapy: Secondary | ICD-10-CM | POA: Diagnosis not present

## 2012-08-20 DIAGNOSIS — D649 Anemia, unspecified: Secondary | ICD-10-CM | POA: Diagnosis not present

## 2012-08-20 DIAGNOSIS — R109 Unspecified abdominal pain: Secondary | ICD-10-CM | POA: Diagnosis not present

## 2012-08-20 DIAGNOSIS — K560 Paralytic ileus: Secondary | ICD-10-CM | POA: Diagnosis not present

## 2012-08-20 DIAGNOSIS — N19 Unspecified kidney failure: Secondary | ICD-10-CM | POA: Diagnosis not present

## 2012-08-20 DIAGNOSIS — N184 Chronic kidney disease, stage 4 (severe): Secondary | ICD-10-CM | POA: Diagnosis not present

## 2012-08-20 DIAGNOSIS — I4729 Other ventricular tachycardia: Secondary | ICD-10-CM | POA: Diagnosis present

## 2012-08-20 DIAGNOSIS — N179 Acute kidney failure, unspecified: Secondary | ICD-10-CM | POA: Diagnosis not present

## 2012-08-20 LAB — URINE CULTURE: Colony Count: 45000

## 2012-08-20 MED ORDER — ENSURE COMPLETE PO LIQD: 237.0000 mL | Freq: Two times a day (BID) | ORAL | Status: DC

## 2012-08-20 MED ORDER — ALPRAZOLAM 1 MG PO TABS: 1.0000 mg | ORAL_TABLET | Freq: Three times a day (TID) | ORAL | Status: DC

## 2012-08-20 MED ORDER — HYDROCODONE-ACETAMINOPHEN 10-325 MG PO TABS: 1.0000 | ORAL_TABLET | Freq: Four times a day (QID) | ORAL | Status: DC | PRN

## 2012-08-20 NOTE — Discharge Planning (Deleted)
PATIENT DETAILS Name: [name] [name] Age: 77 y.o. Sex: female Date of Birth: 10-26-1935 MRN: [id number]. Admit Date: 08/06/2012 Admitting Physician: Renae Fickle, MD WUJ:[id number],WGNFAO L, MD  Recommendations for Outpatient Follow-up:  1. Need to repeat LFT's prior to resuming Statins 2. Currently off all anti-hypertensive meds-resume slowly as BP increases 3. Needs CBC, Lytes and LFT's checked in 1-2 weeks  4. Needs followup with Dr Dickson-[facility]-for continued surveillance of AAA 5. On Dysphagia 2 diet, will need a repeat swallow evaluation in next 1-2 weeks   6. PRIMARY DISCHARGE DIAGNOSIS:  Principal Problem:  *Ileus Active Problems:  ARTHRITIS, HIPS, BILATERAL  AAA (abdominal aortic aneurysm)  COPD (chronic obstructive pulmonary disease)  Hypotension  ARF on CKD 4  Small bowel obstruction  Hypernatremia  Acute encephalopathy   UTI (lower urinary tract infection)  Dysphagia  Anemia  Depression      PAST MEDICAL HISTORY: Past Medical History  Diagnosis Date  . Hypertension   . Hyperlipidemia   . COPD (chronic obstructive pulmonary disease)   . Arthritis   . Depression with anxiety   . GERD (gastroesophageal reflux disease)   . Hiatal hernia   . Atrial fibrillation   . Productive cough   . Leg pain   . AAA (abdominal aortic aneurysm)   . CAD (coronary artery disease)   . Chronic kidney disease     DISCHARGE MEDICATIONS: Medication List  As of 08/20/2012 10:06 AM   STOP taking these medications         amLODipine 2.5 MG tablet      diclofenac sodium 1 % Gel      HYDROcodone-homatropine 5-1.5 MG/5ML syrup      nebivolol 5 MG tablet      omega-3 acid ethyl esters 1 G capsule      rosuvastatin 40 MG tablet      SPIRIVA HANDIHALER 18 MCG inhalation capsule      torsemide 20 MG tablet      TRILIPIX 135 MG capsule         TAKE these medications         acetaminophen 500 MG tablet   Commonly known as: TYLENOL   Take 500 mg by mouth every 4  (four) hours as needed. For pain      albuterol 108 (90 BASE) MCG/ACT inhaler   Commonly known as: PROVENTIL HFA;VENTOLIN HFA   Inhale 2 puffs into the lungs every 4 (four) hours as needed. For wheezing      ALPRAZolam 1 MG tablet   Commonly known as: XANAX   Take 1 tablet (1 mg total) by mouth 3 (three) times daily.      ANTACID LIQUID PO   Take 30 mLs by mouth every 2 (two) hours as needed. Heartburn      feeding supplement Liqd   Take 237 mLs by mouth 2 (two) times daily between meals.      HYDROcodone-acetaminophen 10-325 MG per tablet   Commonly known as: NORCO   Take 1 tablet by mouth every 6 (six) hours as needed for pain.      omeprazole 20 MG capsule   Commonly known as: PRILOSEC   Take 20 mg by mouth daily.      simethicone 80 MG chewable tablet   Commonly known as: MYLICON   Chew 80 mg by mouth 3 (three) times daily.             BRIEF HPI:  See H&P, Labs, Consult and Test reports for all details  in brief,77 yow who was transferred from [facility] to hypotension, ABD pain, UTI along with a history of an AAA which is being followed by Dr, Waverly Ferrari. She lives at an Assisted Living facility [facility]) and has had complaints of Abd Pain, nausea and dry heaves X 3 days. Admitted for hypotension, possible early sepsis. Seen by vascular surguery for history of AAA (stable) and signed off. Abdominal films revealed SBO versus ileus and patient was seen by general surgery for persistant ileus   CONSULTATIONS:   general surgery and vascular surgery  PERTINENT RADIOLOGIC STUDIES: Ct Abdomen Pelvis Wo Contrast  08/04/2012  *RADIOLOGY REPORT*  Clinical Data: Follow-up abdominal aortic aneurysm.  CT ABDOMEN AND PELVIS WITHOUT CONTRAST  Technique:  Multidetector CT imaging of the abdomen and pelvis was performed following the standard protocol without intravenous contrast.  Comparison: 12/04/2011  Findings: Infrarenal abdominal aortic aneurysm measures 5.4 x  5.4 cm, without significant change in size since previous study.  No evidence of aneurysm leak or rupture.  No evidence of iliac artery aneurysm.  Tiny nonobstructing left intrarenal calculus again noted measuring less than 5 mm.  Bilateral renal parenchymal atrophy again demonstrated, however there is no evidence of renal mass or hydronephrosis.  The other abdominal parenchymal organs have a normal appearance on this noncontrast study. Small calcified gallstones seen in the gallbladder fundus, however there is no evidence of cholecystitis.  Uterus and adnexa are unremarkable in appearance.  No soft tissue masses or lymphadenopathy identified.  No evidence of inflammatory process or abnormal fluid collections.  Severe bilateral hip DJD also noted with chronic collapse of the articular surface of both femoral heads.  IMPRESSION:  1.  Stable 5.4 cm infrarenal abdominal aortic aneurysm.  No evidence of aneurysm leak or rupture. 2.  Nonobstructing left nephrolithiasis and cholelithiasis.  Original Report Authenticated By: Danae Orleans, M.D.   Dg Abd 1 View  08/14/2012  *RADIOLOGY REPORT*  Clinical Data: Abdominal distention.  ABDOMEN - 1 VIEW  Comparison: 08/11/2012  Findings: Supine images of the abdomen demonstrate gaseous distention of the stomach.  There are still gas-filled loops of small bowel.  Overall, there is decreased small bowel distention. Again noted is sclerosis and abnormality of the femoral heads. There is high-density material contrast within the rectum.  The nasogastric tube has been removed.  Large gas-filled structure in the right lower quadrant is nonspecific and could be associated with the cecum. Limited evaluation for free air on the supine images.  IMPRESSION: Decreased small bowel gaseous distention.  Slightly increased gaseous distention of the stomach since removal of the nasogastric tube.   Original Report Authenticated By: Richarda Overlie, M.D.    Ct Head Wo Contrast  08/15/2012   *RADIOLOGY REPORT*  Clinical Data: Acute encephalopathy  CT HEAD WITHOUT CONTRAST  Technique:  Contiguous axial images were obtained from the base of the skull through the vertex without contrast.  Comparison: 09/11/2008  Findings: Motion degraded images.  No evidence of parenchymal hemorrhage or extra-axial fluid collection. No mass lesion, mass effect, or midline shift.  No CT evidence of acute infarction.  Subcortical white matter and periventricular small vessel ischemic changes.  Intracranial atherosclerosis.  Global cortical atrophy.  No ventriculomegaly.  The visualized paranasal sinuses are essentially clear. The mastoid air cells are unopacified.  No evidence of calvarial fracture.  IMPRESSION: No evidence of acute intracranial abnormality.  Atrophy with small vessel ischemic changes and intracranial atherosclerosis.   Original Report Authenticated By: Charline Bills, M.D.  Ct Angio Chest Pe W/cm &/or Wo Cm  08/17/2012  *RADIOLOGY REPORT*  Clinical Data: Possible right ventricular outflow tract thrombus noted on recent two-dimensional echocardiogram.  Evaluate for potential RVOT thrombus or pulmonary embolism.  CT ANGIOGRAPHY CHEST  Technique:  Multidetector CT imaging of the chest using the standard protocol during bolus administration of intravenous contrast. Multiplanar reconstructed images including MIPs were obtained and reviewed to evaluate the vascular anatomy.  Contrast: OMNIPAQUE IOHEXOL 350 MG/ML SOLN  Comparison: Chest c t 01/18/2007.  Findings:  Mediastinum: Study is severely limited by patient respiratory motion.  Because of this, accurate assessment for distal segmental and subsegmental sized emboli is not possible on this examination. Additionally, in the right lower lobe, even proximal segmental sized emboli cannot be excluded.  With these limitations in mind, there is no evidence of central, lobe are or proximal segmental sized embolism.  Additionally, there is no filling  defects identified within the right ventricle, or the right ventricular outflow tract to suggest thrombus. Heart size is normal. There is no significant pericardial fluid, thickening or pericardial calcification. There is atherosclerosis of the thoracic aorta, the great vessels of the mediastinum and the coronary arteries, including calcified atherosclerotic plaque in the left main, left anterior descending, left circumflex and right coronary arteries. Extensive atheromatous plaque is identified throughout the descending thoracic aorta, with multiple extensions impinging upon the lumen of the aorta (this may be increased risk for embolic event associated with any future catheterization attempts). A nasogastric tube extends to the gastroesophageal junction. Esophagus is unremarkable in appearance. No pathologically enlarged mediastinal or hilar lymph nodes. Right upper extremity PICC is in place with the tip coiled in the right subclavian vein adjacent to the junction with the right internal jugular vein.  Lungs/Pleura: No consolidative airspace disease.  No pleural effusions.  Extensive respiratory motion limits sensitivity for detection of small pulmonary nodules.  No large suspicious appearing pulmonary nodules or masses are confidently identified.  Upper Abdomen: Unremarkable.  Musculoskeletal: There are no aggressive appearing lytic or blastic lesions noted in the visualized portions of the skeleton.  IMPRESSION: 1.  Exceedingly limited examination secondary to respiratory motion with no obvious pulmonary embolism, as above. 2.  No filling defect in the right ventricular outflow tract to suggest RVOT thrombus. 3.  The patient's right upper extremity PICC is coiled upon itself with the tip in the subclavian vein adjacent to the junction with the right internal jugular vein. 4. Severe atherosclerosis, including left main and three-vessel coronary artery disease.  Assessment for potential risk factor modification,  dietary therapy or pharmacologic therapy may be warranted, if clinically indicated. 5.  Other incidental findings, as above.   Original Report Authenticated By: Florencia Reasons, M.D.    Dg Chest Port 1 View  08/17/2012  *RADIOLOGY REPORT*  Clinical Data: PICC repositioning.  PORTABLE CHEST - 1 VIEW  Comparison: CT scan dated 08/17/2012 and chest x-ray dated 05/23/2012  Findings: PICC line is looped in the right subclavian vein and the tip is in the right subclavian vein.  NG tube tip is below the diaphragm.  Proximal side hole is in the distal esophagus.  Heart size and vascularity are normal and the lungs are clear. Slight thoracic scoliosis.  IMPRESSION: PICC line and and the its tip are looped in the right subclavian vein.  Clear lungs.   Original Report Authenticated By: Gwynn Burly, M.D.    Dg Abd 2 Views  08/11/2012  *RADIOLOGY REPORT*  Clinical Data: Small  bowel obstruction  ABDOMEN - 2 VIEW  Comparison: 08/10/2012  Findings: Persistent distended small bowel loops with air-fluid levels consistent with small bowel obstruction.  No free abdominal air.  IMPRESSION:  Again noted distended small bowel loops with air-fluid levels consistent with small bowel obstruction.   Original Report Authenticated By: Natasha Mead, M.D.    Dg Abd 2 Views  08/10/2012  *RADIOLOGY REPORT*  Clinical Data: Abdominal distention, nausea.  ABDOMEN - 2 VIEW  Comparison:   the previous day's study  Findings: No free air on the left lateral decubitus radiograph. Multiple dilated small bowel loops throughout the abdomen with fluid levels on the decubitus film.  Residual contrast in the decompressed colon. Nasogastric tube extends into the nondilated stomach.  IMPRESSION:  1.  Persistent   small bowel obstruction.  No free air.   Original Report Authenticated By: Osa Craver, M.D.    Dg Abd Portable 1v  08/09/2012  *RADIOLOGY REPORT*  Clinical Data: Abdominal distention  PORTABLE ABDOMEN - 1 VIEW  Comparison:  08/18  Findings: There is persistent small bowel dilatation, similar to the previous exam, consistent with small bowel obstruction.  Small amount of gas present in the colon.  Nasogastric tube has its tip in the stomach.  IMPRESSION: Persistent small bowel obstruction pattern.   Original Report Authenticated By: Thomasenia Sales, M.D. ( 08/09/2012 07:48:04 )    Dg Abd Portable 2v  08/16/2012  *RADIOLOGY REPORT*  Clinical Data: Abdominal pain.  Follow up ileus.  PORTABLE ABDOMEN - 2 VIEW  Comparison: 08/15/2012.  Findings: Nasogastric tube tip is at the gastroesophageal junction. Stomach is decompressed when compared with yesterday's exam.  There is persistent dilatation of bowel in the low central abdomen.  No free air.  Severe degenerative changes are seen in the hips with flattening of the left humeral head.  IMPRESSION: Improving gaseous distention of bowel.   Original Report Authenticated By: Reyes Ivan, M.D.    Dg Abd Portable 2v  08/15/2012  *RADIOLOGY REPORT*  Clinical Data: Evaluate small bowel obstruction or ileus.  PORTABLE ABDOMEN - 2 VIEW  Comparison: 08/14/2012  Findings: Supine and left lower decubitus images were obtained. There is no evidence to suggest free air.  There is gas within the right colon.  There is persistent gaseous distention of the stomach.  There are still dilated gas-filled loops of small bowel. The contrast in the rectum has apparently passed and there is now gas in the rectum.  Chronic changes involving both hips.  IMPRESSION: There is persistent gaseous distention of the stomach and bowel loops.  There appears to be increased gas within the colon. Findings are more suggestive for an ileus pattern rather than an obstruction at this time.   Original Report Authenticated By: Richarda Overlie, M.D.    Dg Abd Portable 2v  08/08/2012  *RADIOLOGY REPORT*  Clinical Data: Evaluate small bowel obstruction.  Question worsening.  PORTABLE ABDOMEN - 2 VIEW  Comparison: 08/07/2012   Findings: Nasogastric tube tip overlies the level of the stomach. There is persistent significant dilatation of small bowel loops. No free intraperitoneal air on left lateral decubitus view.  There is persistent contrast, likely within the colon, showing little interval progression since previous exam.  IMPRESSION: Persistent high-grade small bowel obstruction.  Original Report Authenticated By: Patterson Hammersmith, M.D.   Dg Abd Portable 2v  08/07/2012  *RADIOLOGY REPORT*  Clinical Data: Abdominal distention and decreased bowel sounds.  PORTABLE ABDOMEN - 2 VIEW  Comparison: CT  abdomen and pelvis 08/04/2012.  Findings: Multiple dilated loops of small bowel measuring up to 5.8 cm are identified.  No free intraperitoneal air is identified.  IMPRESSION: Bowel gas pattern compatible with small bowel obstruction.  Original Report Authenticated By: Bernadene Bell. Maricela Curet, M.D.     PERTINENT LAB RESULTS: CBC:  Basename 08/19/12 0540 08/18/12 0500  WBC 9.8 10.1  HGB 7.9* 7.8*  HCT 24.9* 24.9*  PLT 250 237   CMET CMP     Component Value Date/Time   NA 140 08/19/2012 0540   K 3.8 08/19/2012 0540   CL 110 08/19/2012 0540   CO2 25 08/19/2012 0540   GLUCOSE 136* 08/19/2012 0540   BUN 19 08/19/2012 0540   CREATININE 0.82 08/19/2012 0540   CALCIUM 8.6 08/19/2012 0540   PROT 5.3* 08/19/2012 0540   ALBUMIN 1.7* 08/19/2012 0540   AST 63* 08/19/2012 0540   ALT 77* 08/19/2012 0540   ALKPHOS 92 08/19/2012 0540   BILITOT 0.3 08/19/2012 0540   GFRNONAA 69* 08/19/2012 0540   GFRAA 80* 08/19/2012 0540    GFR Estimated Creatinine Clearance: 65 ml/min (by C-G formula based on Cr of 0.82). No results found for this basename: LIPASE:2,AMYLASE:2 in the last 72 hours No results found for this basename: CKTOTAL:3,CKMB:3,CKMBINDEX:3,TROPONINI:3 in the last 72 hours No components found with this basename: POCBNP:3 No results found for this basename: DDIMER:2 in the last 72 hours No results found for this basename: HGBA1C:2  in the last 72 hours No results found for this basename: CHOL:2,HDL:2,LDLCALC:2,TRIG:2,CHOLHDL:2,LDLDIRECT:2 in the last 72 hours No results found for this basename: TSH,T4TOTAL,FREET3,T3FREE,THYROIDAB in the last 72 hours No results found for this basename: VITAMINB12:2,FOLATE:2,FERRITIN:2,TIBC:2,IRON:2,RETICCTPCT:2 in the last 72 hours Coags: No results found for this basename: PT:2,INR:2 in the last 72 hours Microbiology: Recent Results (from the past 240 hour(s))  URINE CULTURE     Status: Normal   Collection Time   08/18/12 12:03 AM      Component Value Range Status Comment   Specimen Description URINE, CLEAN CATCH   Final    Special Requests NONE   Final    Culture  Setup Time 08/18/2012 08:45   Final    Colony Count 45,000 COLONIES/ML   Final    Culture ENTEROCOCCUS SPECIES   Final    Report Status 08/20/2012 FINAL   Final    Organism ID, Bacteria ENTEROCOCCUS SPECIES   Final      BRIEF HOSPITAL COURSE:   Principal Problem:  Ileus -Abdominal films revealed SBO versus ileus  -because persistant and prolonged-seen by General Surgery -Kept NPO, and required TNA -With time, improvement occurred, by discharge, able to tolerate a Dys 2 diet, and having regular BM's and off TNA  Active Problems: Acute renal failure  -Resolved.  - Suspect secondary to diuretics (BUN/creatinine pattern prerenal), possible component of Voltaren gel. Creatinine was high 8/13 (see [facility] records). CT abd/pelvis on 8/14 done without IV contrast (oral only) secondary to high creatinine. No CT was performed at [facility]. No history of IV dye allergy or contrast-induced injury.  Hypernatremia-  -Resolved, likely secondary to IVF in setting of minimal PO intake (hyperchloremia noted as well).   Hypokalemia-  -repleted and resolved.   Acute encephalopathy-  - resolved-now awake and alert  - Secondary to narcotic & BZD withdrawal. Developed suddenly 8/21 (all previous notes report alert, normal mental  status). Of note, as outpatient was on scheduled Lortab TID and Xanax TID as outpatient (Xanax for 20 years per family), neither continued on  admission. No listed history of dementia, not on any medications for dementia or mood disorder as an outpatient. Exam non-focal, infectious workup negative. Head CT negative.  UTI-  -treated with 8 days Rocephin, no culture on admission. Patient had already been given antibiotics, therefore later culture unreliable.  -currently not on any antibiotics  H/o HTN  -previously as outpatient on numerous anti-hypertensives- all on hold due to hypotension during early hospital cours  -BP controlled without need for anti-hypertensive medications-resume as needed  Possible right ventricular outflow tract obstruction/thrombus- Artifactual -Echo on 08/10/12 notable for possible RVOT thrombus--unable to get VQ scan secondary to patient's agitation. CTA chest on 8/27-negative for RVOT-therefore heparin stopped. LE dopplers negative.Repeat Echo with contrast on 08/18/12 showed no RVOT.  Anemia of acute illness-  -Slowly trending downwards since admission-but now stable-last Hb-7.9 - follow with daily CBC in next 1-2 weeks to make sure it is improving as she is improving from acute illness.   Elevated transaminases-  -Improvement continues. AP & bilirubin normal. Possibly secondary to Crestor vs hypotension  -monitor periodically before restating Statins  SVT 8/21-  -no recurrence.  AAA-  -Followed by Dr. Calla Kicks in 6 months. CT 8/14--stable 5.4 cm infrarenal aneurysm w/o leak or rupture   Dysphagia-  -likely 2/2 to acute illness -improving, per last speech therapy recommendation-Dys 2 diet, patient agreeable as well. Aware of risk of occult aspiration, and willing to accept -will need a repeat swallow evaluation while at SNF  Depression with anxiety-  -continue BZD  COPD-  -Stable currently  -c/w Spiriva  Chronic hip pain- -resume chronic  narcotics  TODAY-DAY OF DISCHARGE:  Subjective:   [name] [name] today has no headache,no chest abdominal pain,no new weakness tingling or numbness, feels much better wants to go home today. Has had numerous BM's yesterday and this am as well.  Objective:   Blood pressure 107/62, pulse 72, temperature 98.6 F (37 C), temperature source Oral, resp. rate 20, height 5\' 4"  (1.626 m), weight 88.996 kg (196 lb 3.2 oz), SpO2 86.00%.  Intake/Output Summary (Last 24 hours) at 08/20/12 1006 Last data filed at 08/20/12 0915  Gross per 24 hour  Intake    840 ml  Output      0 ml  Net    840 ml    Exam Awake Alert, Oriented *3, No new F.N deficits, Normal affect [place].AT,PERRAL Supple Neck,No JVD, No cervical lymphadenopathy appriciated.  Symmetrical Chest wall movement, Good air movement bilaterally, CTAB RRR,No Gallops,Rubs or new Murmurs, No Parasternal Heave +ve B.Sounds, Abd Soft, Non tender, No organomegaly appriciated, No rebound -guarding or rigidity. No Cyanosis, Clubbing or edema, No new Rash or bruise  DISCHARGE CONDITION: Stable  DISPOSITION: SNF   DISCHARGE INSTRUCTIONS:    Activity:  As tolerated with Full fall precautions use walker/cane & assistance as needed  Diet recommendation: Dysphagia 2 diet with standard aspiration precautions*  Follow-up Information    Follow up with HAWKINS,EDWARD L, MD. Schedule an appointment as soon as possible for a visit in 1 week. (after discharge from SNF)    Contact information:   [address] [place] [address] [phone]       Follow up with Chuck Hint, MD. Schedule an appointment as soon as possible for a visit in 3 months.   Contact information:   [address] [place] [address] [phone]         Total Time spent on discharge equals 45 minutes.  SignedJeoffrey Massed 08/20/2012  10:06 AM

## 2012-08-20 NOTE — Progress Notes (Signed)
Patient ID: [name] [name], female   DOB: 1935-09-28, 77 y.o.   MRN: [id number]    Subjective: Pt wo complaints, eating well, min abd pain, +bm  Objective: Vital signs in last 24 hours: Temp:  [98.3 F (36.8 C)-99.7 F (37.6 C)] 98.6 F (37 C) (08/30 0425) Pulse Rate:  [65-78] 72  (08/30 0425) Resp:  [20] 20  (08/30 0425) BP: (107-119)/(62-76) 107/62 mmHg (08/30 0425) SpO2:  [86 %-98 %] 86 % (08/30 0823) Weight:  [196 lb 3.2 oz (88.996 kg)] 196 lb 3.2 oz (88.996 kg) (08/30 0425) Last BM Date: 08/18/12  Intake/Output from previous day: 08/29 0701 - 08/30 0700 In: 720 [P.O.:720] Out: -  Intake/Output this shift:   Physical Exam: General appearance: alert, cooperative, appears stated age and no distress Chest: CTA bilaterally Cardiac: RRR Abd: soft, nondistended, +bs, min tenderness  Lab Results:   Basename 08/19/12 0540 08/18/12 0500  WBC 9.8 10.1  HGB 7.9* 7.8*  HCT 24.9* 24.9*  PLT 250 237   BMET  Basename 08/19/12 0540 08/18/12 1025  NA 140 143  K 3.8 4.0  CL 110 111  CO2 25 25  GLUCOSE 136* 128*  BUN 19 21  CREATININE 0.82 0.88  CALCIUM 8.6 8.5   PT/INR No results found for this basename: LABPROT:2,INR:2 in the last 72 hours ABG No results found for this basename: PHART:2,PCO2:2,PO2:2,HCO3:2 in the last 72 hours  Studies/Results: No results found.  Anti-infectives: Anti-infectives     Start     Dose/Rate Route Frequency Ordered Stop   08/11/12 1200   cefTRIAXone (ROCEPHIN) 1 g in dextrose 5 % 50 mL IVPB  Status:  Discontinued        1 g 100 mL/hr over 30 Minutes Intravenous Every 24 hours 08/11/12 1137 08/14/12 1351   08/07/12 0930   cefTRIAXone (ROCEPHIN) 1 g in dextrose 5 % 50 mL IVPB  Status:  Discontinued        1 g 100 mL/hr over 30 Minutes Intravenous Every 24 hours 08/07/12 0845 08/10/12 2310          Assessment/Plan: Ileus: resolved, regular diet, ok to d/c to [facility] when medically ready, does not need follow up with surgery unless  needed.     LOS: 14 days    Hazley Dezeeuw 08/20/2012

## 2012-08-20 NOTE — Progress Notes (Signed)
Agree with above. Ready for discharge to SNF.  Wilmon Arms. Corliss Skains, MD, [facility]  08/20/2012 1:38 PM

## 2012-08-20 NOTE — Plan of Care (Signed)
Problem: Phase III Progression Outcomes Goal: Activity at appropriate level-compared to baseline (UP IN CHAIR FOR HEMODIALYSIS)  Outcome: Completed/Met Date Met:  08/20/12 Will cont to have pt at snf

## 2012-08-20 NOTE — Progress Notes (Signed)
Physical Therapy Treatment Patient Details Name: [name] [name] MRN: [id number] DOB: 24-Sep-1935 Today's Date: 08/20/2012 Time: 6578-4696 PT Time Calculation (min): 19 min  PT Assessment / Plan / Recommendation Comments on Treatment Session  Slow progress with mobility, balance and mentation.  Agree with need for SNF at discharge for continued therapy.    Follow Up Recommendations  Skilled nursing facility    Barriers to Discharge        Equipment Recommendations  Defer to next venue    Recommendations for Other Services    Frequency Min 3X/week   Plan Discharge plan remains appropriate;Frequency remains appropriate    Precautions / Restrictions Precautions Precautions: Fall Restrictions Weight Bearing Restrictions: No       Mobility  Bed Mobility Bed Mobility: Rolling Right;Right Sidelying to Sit;Sit to Supine Rolling Right: 4: Min assist;With rail Right Sidelying to Sit: 3: Mod assist;With rails;HOB flat Sit to Supine: 1: +2 Total assist Sit to Supine: Patient Percentage: 20% Details for Bed Mobility Assistance: Mod assist to raise trunk off bed.  Verbal and tactie cues for technique.   Transfers Transfers: Not assessed    Exercises General Exercises - Lower Extremity Ankle Circles/Pumps: AROM;Both;10 reps;Seated Long Arc Quad: AROM;Both;10 reps;Seated Hip ABduction/ADduction: AROM;Both;10 reps;Seated Hip Flexion/Marching: AAROM;Both;5 reps;Seated     PT Goals Acute Rehab PT Goals PT Goal: Sit to Stand - Progress: Progressing toward goal  Visit Information  Last PT Received On: 08/20/12 Assistance Needed: +2    Subjective Data  Subjective: My back is sore from laying in the bed.   Cognition  Overall Cognitive Status: Impaired Area of Impairment: Memory;Safety/judgement;Problem solving Arousal/Alertness: Awake/alert Orientation Level: Disoriented to;Place;Time;Situation Behavior During Session: WFL for tasks performed Current Attention Level:  Sustained Memory: Decreased recall of precautions Following Commands: Follows one step commands with increased time Safety/Judgement: Decreased safety judgement for tasks assessed;Decreased awareness of need for assistance    Balance  Balance Balance Assessed: Yes Static Sitting Balance Static Sitting - Balance Support: Right upper extremity supported;Feet supported Static Sitting - Level of Assistance: 5: Stand by assistance Static Sitting - Comment/# of Minutes: Patient sat on EOB x 15 minutes.  Able to sit with no UE support for 4 minutes.  Patient performed LE exercises in sitting with UE support maintaining own balance.  End of Session PT - End of Session Equipment Utilized During Treatment: Oxygen Activity Tolerance: Patient limited by fatigue Patient left: in bed;with call bell/phone within reach Nurse Communication: Mobility status   GP     Vena Austria 08/20/2012, 10:11 AM Durenda Hurt. Renaldo Fiddler, [facility] Acute Rehab Services Pager [phone]

## 2012-08-20 NOTE — Progress Notes (Signed)
Clinical Social Worker facilitated pt discharge needs including contacting facility, faxing pt discharge information via [facility], contacted pt daughter via telephone to notify and notified pt at bedside, providing RN contact information to call report, and arranging ambulance transportation for pt to [facility]. No further social work needs identified at this time. Clinical Social Worker signing off.   Jacklynn Lewis, MSW, LCSWA  Clinical Social Work [phone]

## 2012-08-20 NOTE — Discharge Planning (Signed)
[name] [name] [id number] Discharge Data: 08/20/2012 5:36 PM Attending Provider: Maretta Bees, MD WUJ:[id number],WGNFAO Elbert Ewings, MD     [name] M Follett to be D/C'd Skilled nursing facility per MD order. Discharge packet sent with pt. PICC discontinued by iv team with no bleeding noted. All belongings returned to patient for patient to take to snf.   Last Vital Signs:  Blood pressure 107/62, pulse 72, temperature 98.6 F (37 C), temperature source Oral, resp. rate 20, height 5\' 4"  (1.626 m), weight 88.996 kg (196 lb 3.2 oz), SpO2 86.00%.  Discharge Medication List Medication List  As of 08/20/2012  5:36 PM   STOP taking these medications         amLODipine 2.5 MG tablet      diclofenac sodium 1 % Gel      HYDROcodone-homatropine 5-1.5 MG/5ML syrup      nebivolol 5 MG tablet      omega-3 acid ethyl esters 1 G capsule      rosuvastatin 40 MG tablet      SPIRIVA HANDIHALER 18 MCG inhalation capsule      torsemide 20 MG tablet      TRILIPIX 135 MG capsule         TAKE these medications         acetaminophen 500 MG tablet   Commonly known as: TYLENOL   Take 500 mg by mouth every 4 (four) hours as needed. For pain      albuterol 108 (90 BASE) MCG/ACT inhaler   Commonly known as: PROVENTIL HFA;VENTOLIN HFA   Inhale 2 puffs into the lungs every 4 (four) hours as needed. For wheezing      ALPRAZolam 1 MG tablet   Commonly known as: XANAX   Take 1 tablet (1 mg total) by mouth 3 (three) times daily.      ANTACID LIQUID PO   Take 30 mLs by mouth every 2 (two) hours as needed. Heartburn      feeding supplement Liqd   Take 237 mLs by mouth 2 (two) times daily between meals.      HYDROcodone-acetaminophen 10-325 MG per tablet   Commonly known as: NORCO   Take 1 tablet by mouth every 6 (six) hours as needed for pain.      omeprazole 20 MG capsule   Commonly known as: PRILOSEC   Take 20 mg by mouth daily.      simethicone 80 MG chewable tablet   Commonly known as: MYLICON     Chew 80 mg by mouth 3 (three) times daily.            Rosalie Doctor, RN 08/20/2012 5:36 PM

## 2012-08-21 NOTE — Progress Notes (Signed)
Pt.'s B/P=76/74;NP [facility] was called & made aware & ordered to check it manually.B/P taken manually & is 90/60.Text back NP [facility] with the B/P manually & no further orders received.TPN was restarted by IV therapist after the PICC line was reinserted.Continued to monitor pt.

## 2012-08-22 DIAGNOSIS — N179 Acute kidney failure, unspecified: Secondary | ICD-10-CM | POA: Diagnosis not present

## 2012-08-22 DIAGNOSIS — I959 Hypotension, unspecified: Secondary | ICD-10-CM | POA: Diagnosis not present

## 2012-08-22 DIAGNOSIS — I739 Peripheral vascular disease, unspecified: Secondary | ICD-10-CM | POA: Diagnosis not present

## 2012-08-22 DIAGNOSIS — I714 Abdominal aortic aneurysm, without rupture: Secondary | ICD-10-CM | POA: Diagnosis not present

## 2012-08-22 DIAGNOSIS — Z5189 Encounter for other specified aftercare: Secondary | ICD-10-CM | POA: Diagnosis not present

## 2012-08-22 DIAGNOSIS — K56609 Unspecified intestinal obstruction, unspecified as to partial versus complete obstruction: Secondary | ICD-10-CM | POA: Diagnosis not present

## 2012-08-22 DIAGNOSIS — I2510 Atherosclerotic heart disease of native coronary artery without angina pectoris: Secondary | ICD-10-CM | POA: Diagnosis not present

## 2012-08-22 DIAGNOSIS — D649 Anemia, unspecified: Secondary | ICD-10-CM | POA: Diagnosis not present

## 2012-08-22 DIAGNOSIS — K560 Paralytic ileus: Secondary | ICD-10-CM | POA: Diagnosis not present

## 2012-08-22 DIAGNOSIS — R000 Tachycardia, unspecified: Secondary | ICD-10-CM | POA: Diagnosis not present

## 2012-08-22 DIAGNOSIS — N184 Chronic kidney disease, stage 4 (severe): Secondary | ICD-10-CM | POA: Diagnosis not present

## 2012-08-22 DIAGNOSIS — M1990 Unspecified osteoarthritis, unspecified site: Secondary | ICD-10-CM | POA: Diagnosis not present

## 2012-08-22 DIAGNOSIS — N19 Unspecified kidney failure: Secondary | ICD-10-CM | POA: Diagnosis not present

## 2012-08-22 DIAGNOSIS — I10 Essential (primary) hypertension: Secondary | ICD-10-CM | POA: Diagnosis not present

## 2012-08-22 DIAGNOSIS — I498 Other specified cardiac arrhythmias: Secondary | ICD-10-CM | POA: Diagnosis not present

## 2012-08-22 DIAGNOSIS — J449 Chronic obstructive pulmonary disease, unspecified: Secondary | ICD-10-CM | POA: Diagnosis not present

## 2012-08-22 DIAGNOSIS — M129 Arthropathy, unspecified: Secondary | ICD-10-CM | POA: Diagnosis not present

## 2012-08-22 NOTE — Discharge Summary (Signed)
PATIENT DETAILS  Name: [name] [name]  Age: 77 y.o.  Sex: female  Date of Birth: February 07, 1935  MRN: [id number].  Admit Date: 08/06/2012  Admitting Physician: Renae Fickle, MD  WUJ:[id number],WGNFAO L, MD   Recommendations for Outpatient Follow-up:  1. Need to repeat LFT's prior to resuming Statins 2. Currently off all anti-hypertensive meds-resume slowly as BP increases 3. Needs CBC, Lytes and LFT's checked in 1-2 weeks  4. Needs followup with Dr Dickson-[facility]-for continued surveillance of AAA 5. On Dysphagia 2 diet, will need a repeat swallow evaluation in next 1-2 weeks  PRIMARY DISCHARGE DIAGNOSIS: Principal Problem:  *Ileus  Active Problems:  ARTHRITIS, HIPS, BILATERAL  AAA (abdominal aortic aneurysm)  COPD (chronic obstructive pulmonary disease)  Hypotension  ARF on CKD 4  Small bowel obstruction  Hypernatremia  Acute encephalopathy  UTI (lower urinary tract infection)  Dysphagia  Anemia  Depression   PAST MEDICAL HISTORY:  Past Medical History   Diagnosis  Date   .  Hypertension    .  Hyperlipidemia    .  COPD (chronic obstructive pulmonary disease)    .  Arthritis    .  Depression with anxiety    .  GERD (gastroesophageal reflux disease)    .  Hiatal hernia    .  Atrial fibrillation    .  Productive cough    .  Leg pain    .  AAA (abdominal aortic aneurysm)    .  CAD (coronary artery disease)    .  Chronic kidney disease     DISCHARGE MEDICATIONS:  Medication List  As of 08/20/2012 10:06 AM    STOP taking these medications          amLODipine 2.5 MG tablet      diclofenac sodium 1 % Gel      HYDROcodone-homatropine 5-1.5 MG/5ML syrup      nebivolol 5 MG tablet      omega-3 acid ethyl esters 1 G capsule      rosuvastatin 40 MG tablet      SPIRIVA HANDIHALER 18 MCG inhalation capsule      torsemide 20 MG tablet      TRILIPIX 135 MG capsule       TAKE these medications          acetaminophen 500 MG tablet      Commonly known as: TYLENOL      Take 500  mg by mouth every 4 (four) hours as needed. For pain      albuterol 108 (90 BASE) MCG/ACT inhaler      Commonly known as: PROVENTIL HFA;VENTOLIN HFA      Inhale 2 puffs into the lungs every 4 (four) hours as needed. For wheezing      ALPRAZolam 1 MG tablet      Commonly known as: XANAX      Take 1 tablet (1 mg total) by mouth 3 (three) times daily.      ANTACID LIQUID PO      Take 30 mLs by mouth every 2 (two) hours as needed. Heartburn      feeding supplement Liqd      Take 237 mLs by mouth 2 (two) times daily between meals.      HYDROcodone-acetaminophen 10-325 MG per tablet      Commonly known as: NORCO      Take 1 tablet by mouth every 6 (six) hours as needed for pain.      omeprazole 20 MG capsule  Commonly known as: PRILOSEC      Take 20 mg by mouth daily.      simethicone 80 MG chewable tablet      Commonly known as: MYLICON      Chew 80 mg by mouth 3 (three) times daily.         BRIEF HPI:  See H&P, Labs, Consult and Test reports for all details in brief,77 yow who was transferred from [facility] to hypotension, ABD pain, UTI along with a history of an AAA which is being followed by Dr, Waverly Ferrari. She lives at an Assisted Living facility [facility]) and has had complaints of Abd Pain, nausea and dry heaves X 3 days. Admitted for hypotension, possible early sepsis. Seen by vascular surguery for history of AAA (stable) and signed off. Abdominal films revealed SBO versus ileus and patient was seen by general surgery for persistant ileus   CONSULTATIONS:  general surgery and vascular surgery   PERTINENT RADIOLOGIC STUDIES:  Ct Abdomen Pelvis Wo Contrast  08/04/2012 *RADIOLOGY REPORT* Clinical Data: Follow-up abdominal aortic aneurysm. CT ABDOMEN AND PELVIS WITHOUT CONTRAST Technique: Multidetector CT imaging of the abdomen and pelvis was performed following the standard protocol without intravenous contrast. Comparison: 12/04/2011 Findings: Infrarenal abdominal  aortic aneurysm measures 5.4 x 5.4 cm, without significant change in size since previous study. No evidence of aneurysm leak or rupture. No evidence of iliac artery aneurysm. Tiny nonobstructing left intrarenal calculus again noted measuring less than 5 mm. Bilateral renal parenchymal atrophy again demonstrated, however there is no evidence of renal mass or hydronephrosis. The other abdominal parenchymal organs have a normal appearance on this noncontrast study. Small calcified gallstones seen in the gallbladder fundus, however there is no evidence of cholecystitis. Uterus and adnexa are unremarkable in appearance. No soft tissue masses or lymphadenopathy identified. No evidence of inflammatory process or abnormal fluid collections. Severe bilateral hip DJD also noted with chronic collapse of the articular surface of both femoral heads. IMPRESSION: 1. Stable 5.4 cm infrarenal abdominal aortic aneurysm. No evidence of aneurysm leak or rupture. 2. Nonobstructing left nephrolithiasis and cholelithiasis. Original Report Authenticated By: Danae Orleans, M.D.  Dg Abd 1 View  08/14/2012 *RADIOLOGY REPORT* Clinical Data: Abdominal distention. ABDOMEN - 1 VIEW Comparison: 08/11/2012 Findings: Supine images of the abdomen demonstrate gaseous distention of the stomach. There are still gas-filled loops of small bowel. Overall, there is decreased small bowel distention. Again noted is sclerosis and abnormality of the femoral heads. There is high-density material contrast within the rectum. The nasogastric tube has been removed. Large gas-filled structure in the right lower quadrant is nonspecific and could be associated with the cecum. Limited evaluation for free air on the supine images. IMPRESSION: Decreased small bowel gaseous distention. Slightly increased gaseous distention of the stomach since removal of the nasogastric tube. Original Report Authenticated By: Richarda Overlie, M.D.  Ct Head Wo Contrast  08/15/2012 *RADIOLOGY  REPORT* Clinical Data: Acute encephalopathy CT HEAD WITHOUT CONTRAST Technique: Contiguous axial images were obtained from the base of the skull through the vertex without contrast. Comparison: 09/11/2008 Findings: Motion degraded images. No evidence of parenchymal hemorrhage or extra-axial fluid collection. No mass lesion, mass effect, or midline shift. No CT evidence of acute infarction. Subcortical white matter and periventricular small vessel ischemic changes. Intracranial atherosclerosis. Global cortical atrophy. No ventriculomegaly. The visualized paranasal sinuses are essentially clear. The mastoid air cells are unopacified. No evidence of calvarial fracture. IMPRESSION: No evidence of acute intracranial abnormality. Atrophy with small vessel ischemic  changes and intracranial atherosclerosis. Original Report Authenticated By: Charline Bills, M.D.  Ct Angio Chest Pe W/cm &/or Wo Cm  08/17/2012 *RADIOLOGY REPORT* Clinical Data: Possible right ventricular outflow tract thrombus noted on recent two-dimensional echocardiogram. Evaluate for potential RVOT thrombus or pulmonary embolism. CT ANGIOGRAPHY CHEST Technique: Multidetector CT imaging of the chest using the standard protocol during bolus administration of intravenous contrast. Multiplanar reconstructed images including MIPs were obtained and reviewed to evaluate the vascular anatomy. Contrast: OMNIPAQUE IOHEXOL 350 MG/ML SOLN Comparison: Chest c t 01/18/2007. Findings: Mediastinum: Study is severely limited by patient respiratory motion. Because of this, accurate assessment for distal segmental and subsegmental sized emboli is not possible on this examination. Additionally, in the right lower lobe, even proximal segmental sized emboli cannot be excluded. With these limitations in mind, there is no evidence of central, lobe are or proximal segmental sized embolism. Additionally, there is no filling defects identified within the right ventricle, or  the right ventricular outflow tract to suggest thrombus. Heart size is normal. There is no significant pericardial fluid, thickening or pericardial calcification. There is atherosclerosis of the thoracic aorta, the great vessels of the mediastinum and the coronary arteries, including calcified atherosclerotic plaque in the left main, left anterior descending, left circumflex and right coronary arteries. Extensive atheromatous plaque is identified throughout the descending thoracic aorta, with multiple extensions impinging upon the lumen of the aorta (this may be increased risk for embolic event associated with any future catheterization attempts). A nasogastric tube extends to the gastroesophageal junction. Esophagus is unremarkable in appearance. No pathologically enlarged mediastinal or hilar lymph nodes. Right upper extremity PICC is in place with the tip coiled in the right subclavian vein adjacent to the junction with the right internal jugular vein. Lungs/Pleura: No consolidative airspace disease. No pleural effusions. Extensive respiratory motion limits sensitivity for detection of small pulmonary nodules. No large suspicious appearing pulmonary nodules or masses are confidently identified. Upper Abdomen: Unremarkable. Musculoskeletal: There are no aggressive appearing lytic or blastic lesions noted in the visualized portions of the skeleton. IMPRESSION: 1. Exceedingly limited examination secondary to respiratory motion with no obvious pulmonary embolism, as above. 2. No filling defect in the right ventricular outflow tract to suggest RVOT thrombus. 3. The patient's right upper extremity PICC is coiled upon itself with the tip in the subclavian vein adjacent to the junction with the right internal jugular vein. 4. Severe atherosclerosis, including left main and three-vessel coronary artery disease. Assessment for potential risk factor modification, dietary therapy or pharmacologic therapy may be warranted, if  clinically indicated. 5. Other incidental findings, as above. Original Report Authenticated By: Florencia Reasons, M.D.  Dg Chest Port 1 View  08/17/2012 *RADIOLOGY REPORT* Clinical Data: PICC repositioning. PORTABLE CHEST - 1 VIEW Comparison: CT scan dated 08/17/2012 and chest x-ray dated 05/23/2012 Findings: PICC line is looped in the right subclavian vein and the tip is in the right subclavian vein. NG tube tip is below the diaphragm. Proximal side hole is in the distal esophagus. Heart size and vascularity are normal and the lungs are clear. Slight thoracic scoliosis. IMPRESSION: PICC line and and the its tip are looped in the right subclavian vein. Clear lungs. Original Report Authenticated By: Gwynn Burly, M.D.  Dg Abd 2 Views  08/11/2012 *RADIOLOGY REPORT* Clinical Data: Small bowel obstruction ABDOMEN - 2 VIEW Comparison: 08/10/2012 Findings: Persistent distended small bowel loops with air-fluid levels consistent with small bowel obstruction. No free abdominal air. IMPRESSION: Again noted distended small bowel loops  with air-fluid levels consistent with small bowel obstruction. Original Report Authenticated By: Natasha Mead, M.D.  Dg Abd 2 Views  08/10/2012 *RADIOLOGY REPORT* Clinical Data: Abdominal distention, nausea. ABDOMEN - 2 VIEW Comparison: the previous day's study Findings: No free air on the left lateral decubitus radiograph. Multiple dilated small bowel loops throughout the abdomen with fluid levels on the decubitus film. Residual contrast in the decompressed colon. Nasogastric tube extends into the nondilated stomach. IMPRESSION: 1. Persistent small bowel obstruction. No free air. Original Report Authenticated By: Osa Craver, M.D.  Dg Abd Portable 1v  08/09/2012 *RADIOLOGY REPORT* Clinical Data: Abdominal distention PORTABLE ABDOMEN - 1 VIEW Comparison: 08/18 Findings: There is persistent small bowel dilatation, similar to the previous exam, consistent with small bowel  obstruction. Small amount of gas present in the colon. Nasogastric tube has its tip in the stomach. IMPRESSION: Persistent small bowel obstruction pattern. Original Report Authenticated By: Thomasenia Sales, M.D. ( 08/09/2012 07:48:04 )  Dg Abd Portable 2v  08/16/2012 *RADIOLOGY REPORT* Clinical Data: Abdominal pain. Follow up ileus. PORTABLE ABDOMEN - 2 VIEW Comparison: 08/15/2012. Findings: Nasogastric tube tip is at the gastroesophageal junction. Stomach is decompressed when compared with yesterday's exam. There is persistent dilatation of bowel in the low central abdomen. No free air. Severe degenerative changes are seen in the hips with flattening of the left humeral head. IMPRESSION: Improving gaseous distention of bowel. Original Report Authenticated By: Reyes Ivan, M.D.  Dg Abd Portable 2v  08/15/2012 *RADIOLOGY REPORT* Clinical Data: Evaluate small bowel obstruction or ileus. PORTABLE ABDOMEN - 2 VIEW Comparison: 08/14/2012 Findings: Supine and left lower decubitus images were obtained. There is no evidence to suggest free air. There is gas within the right colon. There is persistent gaseous distention of the stomach. There are still dilated gas-filled loops of small bowel. The contrast in the rectum has apparently passed and there is now gas in the rectum. Chronic changes involving both hips. IMPRESSION: There is persistent gaseous distention of the stomach and bowel loops. There appears to be increased gas within the colon. Findings are more suggestive for an ileus pattern rather than an obstruction at this time. Original Report Authenticated By: Richarda Overlie, M.D.  Dg Abd Portable 2v  08/08/2012 *RADIOLOGY REPORT* Clinical Data: Evaluate small bowel obstruction. Question worsening. PORTABLE ABDOMEN - 2 VIEW Comparison: 08/07/2012 Findings: Nasogastric tube tip overlies the level of the stomach. There is persistent significant dilatation of small bowel loops. No free intraperitoneal air on left  lateral decubitus view. There is persistent contrast, likely within the colon, showing little interval progression since previous exam. IMPRESSION: Persistent high-grade small bowel obstruction. Original Report Authenticated By: Patterson Hammersmith, M.D.  Dg Abd Portable 2v  08/07/2012 *RADIOLOGY REPORT* Clinical Data: Abdominal distention and decreased bowel sounds. PORTABLE ABDOMEN - 2 VIEW Comparison: CT abdomen and pelvis 08/04/2012. Findings: Multiple dilated loops of small bowel measuring up to 5.8 cm are identified. No free intraperitoneal air is identified. IMPRESSION: Bowel gas pattern compatible with small bowel obstruction. Original Report Authenticated By: Bernadene Bell. Maricela Curet, M.D.  PERTINENT LAB RESULTS:  CBC:   Basename  08/19/12 0540  08/18/12 0500   WBC  9.8  10.1   HGB  7.9*  7.8*   HCT  24.9*  24.9*   PLT  250  237    CMET  CMP    Component  Value  Date/Time    NA  140  08/19/2012 0540    K  3.8  08/19/2012 0540  CL  110  08/19/2012 0540    CO2  25  08/19/2012 0540    GLUCOSE  136*  08/19/2012 0540    BUN  19  08/19/2012 0540    CREATININE  0.82  08/19/2012 0540    CALCIUM  8.6  08/19/2012 0540    PROT  5.3*  08/19/2012 0540    ALBUMIN  1.7*  08/19/2012 0540    AST  63*  08/19/2012 0540    ALT  77*  08/19/2012 0540    ALKPHOS  92  08/19/2012 0540    BILITOT  0.3  08/19/2012 0540    GFRNONAA  69*  08/19/2012 0540    GFRAA  80*  08/19/2012 0540    GFR  Estimated Creatinine Clearance: 65 ml/min (by C-G formula based on Cr of 0.82).  No results found for this basename: LIPASE:2,AMYLASE:2 in the last 72 hours  No results found for this basename: CKTOTAL:3,CKMB:3,CKMBINDEX:3,TROPONINI:3 in the last 72 hours  No components found with this basename: POCBNP:3  No results found for this basename: DDIMER:2 in the last 72 hours  No results found for this basename: HGBA1C:2 in the last 72 hours  No results found for this basename: CHOL:2,HDL:2,LDLCALC:2,TRIG:2,CHOLHDL:2,LDLDIRECT:2 in  the last 72 hours  No results found for this basename: TSH,T4TOTAL,FREET3,T3FREE,THYROIDAB in the last 72 hours  No results found for this basename: VITAMINB12:2,FOLATE:2,FERRITIN:2,TIBC:2,IRON:2,RETICCTPCT:2 in the last 72 hours  Coags:  No results found for this basename: PT:2,INR:2 in the last 72 hours  Microbiology:  Recent Results (from the past 240 hour(s))   URINE CULTURE Status: Normal    Collection Time    08/18/12 12:03 AM   Component  Value  Range  Status  Comment    Specimen Description  URINE, CLEAN CATCH   Final     Special Requests  NONE   Final     Culture Setup Time  08/18/2012 08:45   Final     Colony Count  45,000 COLONIES/ML   Final     Culture  ENTEROCOCCUS SPECIES   Final     Report Status  08/20/2012 FINAL   Final     Organism ID, Bacteria  ENTEROCOCCUS SPECIES   Final     BRIEF HOSPITAL COURSE:  Principal Problem:  Ileus  -Abdominal films revealed SBO versus ileus  -because persistant and prolonged-seen by General Surgery  -Kept NPO, and required TNA  -With time, improvement occurred, by discharge, able to tolerate a Dys 2 diet, and having regular BM's and off TNA   Active Problems:  Acute renal failure  -Resolved.  - Suspect secondary to diuretics (BUN/creatinine pattern prerenal), possible component of Voltaren gel. Creatinine was high 8/13 (see [facility] records). CT abd/pelvis on 8/14 done without IV contrast (oral only) secondary to high creatinine. No CT was performed at [facility]. No history of IV dye allergy or contrast-induced injury.   Hypernatremia-  -Resolved, likely secondary to IVF in setting of minimal PO intake (hyperchloremia noted as well).   Hypokalemia-  -repleted and resolved.   Acute encephalopathy-  - resolved-now awake and alert  - Secondary to narcotic & BZD withdrawal. Developed suddenly 8/21 (all previous notes report alert, normal mental status). Of note, as outpatient was on scheduled Lortab TID and Xanax TID as outpatient  (Xanax for 20 years per family), neither continued on admission. No listed history of dementia, not on any medications for dementia or mood disorder as an outpatient. Exam non-focal, infectious workup negative. Head CT negative.   UTI-  -  treated with 8 days Rocephin, no culture on admission. Patient had already been given antibiotics, therefore later culture unreliable.  -currently not on any antibiotics   H/o HTN  -previously as outpatient on numerous anti-hypertensives- all on hold due to hypotension during early hospital cours  -BP controlled without need for anti-hypertensive medications-resume as needed   Possible right ventricular outflow tract obstruction/thrombus- Artifactual  -Echo on 08/10/12 notable for possible RVOT thrombus--unable to get VQ scan secondary to patient's agitation. CTA chest on 8/27-negative for RVOT-therefore heparin stopped. LE dopplers negative.Repeat Echo with contrast on 08/18/12 showed no RVOT.   Anemia of acute illness-  -Slowly trending downwards since admission-but now stable-last Hb-7.9  - follow with daily CBC in next 1-2 weeks to make sure it is improving as she is improving from acute illness.  Elevated transaminases-  -Improvement continues. AP & bilirubin normal. Possibly secondary to Crestor vs hypotension  -monitor periodically before restating Statins   SVT 8/21-  -no recurrence.  AAA-  -Followed by Dr. Calla Kicks in 6 months. CT 8/14--stable 5.4 cm infrarenal aneurysm w/o leak or rupture   Dysphagia-  -likely 2/2 to acute illness  -improving, per last speech therapy recommendation-Dys 2 diet, patient agreeable as well. Aware of risk of occult aspiration, and willing to accept  -will need a repeat swallow evaluation while at SNF   Depression with anxiety-  -continue BZD   COPD-  -Stable currently  -c/w Spiriva   Chronic hip pain-  -resume chronic narcotics   TODAY-DAY OF DISCHARGE:  Subjective:   [name] [name] today has no  headache,no chest abdominal pain,no new weakness tingling or numbness, feels much better wants to go home today. Has had numerous BM's yesterday and this am as well.  Objective:   Blood pressure 107/62, pulse 72, temperature 98.6 F (37 C), temperature source Oral, resp. rate 20, height 5\' 4"  (1.626 m), weight 88.996 kg (196 lb 3.2 oz), SpO2 86.00%.   Intake/Output Summary (Last 24 hours) at 08/20/12 1006 Last data filed at 08/20/12 0915   Gross per 24 hour   Intake  840 ml   Output  0 ml   Net  840 ml    Exam  Awake Alert, Oriented *3, No new F.N deficits, Normal affect  [place].AT,PERRAL  Supple Neck,No JVD, No cervical lymphadenopathy appriciated.  Symmetrical Chest wall movement, Good air movement bilaterally, CTAB  RRR,No Gallops,Rubs or new Murmurs, No Parasternal Heave  +ve B.Sounds, Abd Soft, Non tender, No organomegaly appriciated, No rebound -guarding or rigidity.  No Cyanosis, Clubbing or edema, No new Rash or bruise   DISCHARGE CONDITION:  Stable   DISPOSITION:  SNF   DISCHARGE INSTRUCTIONS:  Activity:  As tolerated with Full fall precautions use walker/cane & assistance as needed   Diet recommendation:  Dysphagia 2 diet with standard aspiration precautions*   Follow-up Information    Follow up with HAWKINS,EDWARD L, MD. Schedule an appointment as soon as possible for a visit in 1 week. (after discharge from SNF)    Contact information:    [address]  [address]  [place] [address]  [phone]       Follow up with Chuck Hint, MD. Schedule an appointment as soon as possible for a visit in 3 months.    Contact information:    [address]  [place] [address]  [phone]         Total Time spent on discharge equals 45 minutes.  Signed:  Jeoffrey Massed  08/20/2012  10:06 AM

## 2012-08-23 LAB — GLUCOSE, CAPILLARY
Glucose-Capillary: 102 mg/dL — ABNORMAL HIGH (ref 70–99)
Glucose-Capillary: 109 mg/dL — ABNORMAL HIGH (ref 70–99)

## 2012-08-24 NOTE — H&P (Signed)
NAME:  [name] [name]              ACCOUNT NO.:  [id number]  MEDICAL RECORD NO.:  [id number]  LOCATION:                                 FACILITY:  PHYSICIAN:  Dallas Torok L. Juanetta Gosling, M.D.DATE OF BIRTH:  1935-10-08  DATE OF ADMISSION:  08/20/2012 DATE OF DISCHARGE:  LH                             HISTORY & PHYSICAL   The patient in the [facility].  [name] [name] was admitted to the [facility] for rehabilitation following a hospitalization for bowel obstruction.  She has multiple other medical problems including bilateral aseptic necrosis of the hip, COPD, coronary artery occlusive disease, peripheral arterial disease with an aortic aneurysm, hypertension, and hyperlipidemia.  She says she feels okay.  She has no new complaints.  She says her breathing is doing fairly well.  Her past medical history is as noted above.  SOCIAL HISTORY:  She has been living in an assisted living facility.  Her family history is positive for COPD.  REVIEW OF SYSTEMS:  Except as mentioned is negative.  PHYSICAL EXAMINATION:  GENERAL:  A well-developed, well-nourished female who is in no distress. HEENT:  Pupils are reactive.  Nose and throat are clear.  Mucous membranes are moist. NECK:  Supple. CHEST:  Shows some rhonchi which are chronic. HEART:  Regular. ABDOMEN:  Soft. EXTREMITIES:  Showed no edema. CENTRAL NERVOUS SYSTEM:  Grossly intact.  ASSESSMENT:  She has small bowel obstruction.  She has chronic renal failure.  She has chronic obstructive pulmonary disease which is pretty stable by history.  She has coronary disease also stable by history and aortic aneurysm.  PLAN:  Continue with her current treatments and medications.  She will undergo rehabilitation and I will follow from there.     Derral Colucci L. Juanetta Gosling, M.D.     ELH/MEDQ  D:  08/22/2012  T:  08/23/2012  Job:  [id number]

## 2012-08-31 ENCOUNTER — Encounter (HOSPITAL_COMMUNITY): Payer: Self-pay | Admitting: *Deleted

## 2012-08-31 ENCOUNTER — Emergency Department (HOSPITAL_COMMUNITY): Payer: Medicare Other

## 2012-08-31 ENCOUNTER — Inpatient Hospital Stay (HOSPITAL_COMMUNITY)
Admission: EM | Admit: 2012-08-31 | Discharge: 2012-09-02 | DRG: 310 | Disposition: A | Discharge status: Skilled Nursing Facility | Payer: Medicare Other | Attending: Pulmonary Disease | Admitting: Pulmonary Disease

## 2012-08-31 DIAGNOSIS — F341 Dysthymic disorder: Secondary | ICD-10-CM | POA: Diagnosis present

## 2012-08-31 DIAGNOSIS — I739 Peripheral vascular disease, unspecified: Secondary | ICD-10-CM | POA: Diagnosis present

## 2012-08-31 DIAGNOSIS — I129 Hypertensive chronic kidney disease with stage 1 through stage 4 chronic kidney disease, or unspecified chronic kidney disease: Secondary | ICD-10-CM | POA: Diagnosis present

## 2012-08-31 DIAGNOSIS — Z882 Allergy status to sulfonamides status: Secondary | ICD-10-CM | POA: Diagnosis not present

## 2012-08-31 DIAGNOSIS — I4892 Unspecified atrial flutter: Secondary | ICD-10-CM | POA: Diagnosis present

## 2012-08-31 DIAGNOSIS — M129 Arthropathy, unspecified: Secondary | ICD-10-CM | POA: Diagnosis present

## 2012-08-31 DIAGNOSIS — D649 Anemia, unspecified: Secondary | ICD-10-CM | POA: Diagnosis present

## 2012-08-31 DIAGNOSIS — E785 Hyperlipidemia, unspecified: Secondary | ICD-10-CM | POA: Diagnosis present

## 2012-08-31 DIAGNOSIS — Z888 Allergy status to other drugs, medicaments and biological substances status: Secondary | ICD-10-CM | POA: Diagnosis not present

## 2012-08-31 DIAGNOSIS — I498 Other specified cardiac arrhythmias: Secondary | ICD-10-CM | POA: Diagnosis present

## 2012-08-31 DIAGNOSIS — Z886 Allergy status to analgesic agent status: Secondary | ICD-10-CM

## 2012-08-31 DIAGNOSIS — J449 Chronic obstructive pulmonary disease, unspecified: Secondary | ICD-10-CM | POA: Diagnosis present

## 2012-08-31 DIAGNOSIS — I472 Ventricular tachycardia, unspecified: Principal | ICD-10-CM | POA: Diagnosis present

## 2012-08-31 DIAGNOSIS — I4891 Unspecified atrial fibrillation: Secondary | ICD-10-CM | POA: Diagnosis present

## 2012-08-31 DIAGNOSIS — M1610 Unilateral primary osteoarthritis, unspecified hip: Secondary | ICD-10-CM | POA: Diagnosis present

## 2012-08-31 DIAGNOSIS — I4729 Other ventricular tachycardia: Secondary | ICD-10-CM | POA: Diagnosis present

## 2012-08-31 DIAGNOSIS — I714 Abdominal aortic aneurysm, without rupture, unspecified: Secondary | ICD-10-CM | POA: Diagnosis present

## 2012-08-31 DIAGNOSIS — Z87442 Personal history of urinary calculi: Secondary | ICD-10-CM

## 2012-08-31 DIAGNOSIS — Z79899 Other long term (current) drug therapy: Secondary | ICD-10-CM

## 2012-08-31 DIAGNOSIS — I471 Supraventricular tachycardia: Secondary | ICD-10-CM

## 2012-08-31 DIAGNOSIS — Z5189 Encounter for other specified aftercare: Secondary | ICD-10-CM | POA: Diagnosis not present

## 2012-08-31 DIAGNOSIS — I10 Essential (primary) hypertension: Secondary | ICD-10-CM | POA: Diagnosis not present

## 2012-08-31 DIAGNOSIS — Z87891 Personal history of nicotine dependence: Secondary | ICD-10-CM

## 2012-08-31 DIAGNOSIS — N189 Chronic kidney disease, unspecified: Secondary | ICD-10-CM | POA: Diagnosis present

## 2012-08-31 DIAGNOSIS — I499 Cardiac arrhythmia, unspecified: Secondary | ICD-10-CM | POA: Diagnosis not present

## 2012-08-31 DIAGNOSIS — J4489 Other specified chronic obstructive pulmonary disease: Secondary | ICD-10-CM | POA: Diagnosis present

## 2012-08-31 DIAGNOSIS — I2510 Atherosclerotic heart disease of native coronary artery without angina pectoris: Secondary | ICD-10-CM | POA: Diagnosis not present

## 2012-08-31 DIAGNOSIS — R000 Tachycardia, unspecified: Secondary | ICD-10-CM | POA: Diagnosis not present

## 2012-08-31 DIAGNOSIS — K219 Gastro-esophageal reflux disease without esophagitis: Secondary | ICD-10-CM | POA: Diagnosis present

## 2012-08-31 DIAGNOSIS — K56609 Unspecified intestinal obstruction, unspecified as to partial versus complete obstruction: Secondary | ICD-10-CM | POA: Diagnosis not present

## 2012-08-31 HISTORY — DX: Calculus of kidney: N20.0

## 2012-08-31 HISTORY — DX: Other specified cardiac arrhythmias: I49.8

## 2012-08-31 HISTORY — DX: Benign neoplasm of pituitary gland: D35.2

## 2012-08-31 HISTORY — DX: Unspecified osteoarthritis, unspecified site: M19.90

## 2012-08-31 HISTORY — DX: Nicotine dependence, unspecified, in remission: F17.201

## 2012-08-31 HISTORY — DX: Atherosclerotic heart disease of native coronary artery without angina pectoris: I25.10

## 2012-08-31 LAB — BASIC METABOLIC PANEL
BUN: 11 mg/dL (ref 6–23)
CO2: 23 mEq/L (ref 19–32)
Calcium: 9 mg/dL (ref 8.4–10.5)
Glucose, Bld: 112 mg/dL — ABNORMAL HIGH (ref 70–99)
Sodium: 141 mEq/L (ref 135–145)

## 2012-08-31 LAB — CBC
HCT: 34.4 % — ABNORMAL LOW (ref 36.0–46.0)
Hemoglobin: 10.6 g/dL — ABNORMAL LOW (ref 12.0–15.0)
MCH: 29.9 pg (ref 26.0–34.0)
MCHC: 30.8 g/dL (ref 30.0–36.0)
RBC: 3.55 MIL/uL — ABNORMAL LOW (ref 3.87–5.11)

## 2012-08-31 MED ORDER — ALUM & MAG HYDROXIDE-SIMETH 200-200-20 MG/5ML PO SUSP: 30.0000 mL | Freq: Four times a day (QID) | ORAL | Status: DC | PRN

## 2012-08-31 MED ORDER — HYDROCODONE-ACETAMINOPHEN 10-325 MG PO TABS
1.0000 | ORAL_TABLET | Freq: Four times a day (QID) | ORAL | Status: DC | PRN
  Administered 2012-08-31 – 2012-09-01 (×3): 1 via ORAL
  Filled 2012-08-31 (×3): qty 1

## 2012-08-31 MED ORDER — ACETAMINOPHEN 500 MG PO TABS
500.0000 mg | ORAL_TABLET | ORAL | Status: DC | PRN
  Administered 2012-09-01 – 2012-09-02 (×3): 500 mg via ORAL
  Filled 2012-08-31 (×3): qty 1

## 2012-08-31 MED ORDER — SODIUM CHLORIDE 0.9 % IV SOLN
INTRAVENOUS | Status: DC
  Administered 2012-08-31: 20:00:00 via INTRAVENOUS
  Administered 2012-09-01: 1000 mL via INTRAVENOUS

## 2012-08-31 MED ORDER — ADENOSINE 6 MG/2ML IV SOLN
INTRAVENOUS | Status: AC
  Filled 2012-08-31: qty 2

## 2012-08-31 MED ORDER — PANTOPRAZOLE SODIUM 40 MG PO TBEC
40.0000 mg | DELAYED_RELEASE_TABLET | Freq: Every day | ORAL | Status: DC
  Administered 2012-09-01 – 2012-09-02 (×2): 40 mg via ORAL
  Filled 2012-08-31 (×2): qty 1

## 2012-08-31 MED ORDER — SODIUM CHLORIDE 0.9 % IV SOLN: INTRAVENOUS | Status: AC

## 2012-08-31 MED ORDER — ENSURE COMPLETE PO LIQD
237.0000 mL | Freq: Two times a day (BID) | ORAL | Status: DC
  Administered 2012-09-01 – 2012-09-02 (×3): 237 mL via ORAL
  Filled 2012-08-31: qty 237

## 2012-08-31 MED ORDER — ALPRAZOLAM 1 MG PO TABS
1.0000 mg | ORAL_TABLET | Freq: Three times a day (TID) | ORAL | Status: DC
  Administered 2012-08-31 – 2012-09-02 (×5): 1 mg via ORAL
  Filled 2012-08-31 (×5): qty 1

## 2012-08-31 MED ORDER — ADENOSINE 6 MG/2ML IV SOLN
6.0000 mg | Freq: Once | INTRAVENOUS | Status: AC
  Administered 2012-08-31: 6 mg via INTRAVENOUS

## 2012-08-31 MED ORDER — ENOXAPARIN SODIUM 30 MG/0.3ML ~~LOC~~ SOLN: 30.0000 mg | SUBCUTANEOUS | Status: DC

## 2012-08-31 MED ORDER — ALBUTEROL SULFATE HFA 108 (90 BASE) MCG/ACT IN AERS: 2.0000 | INHALATION_SPRAY | RESPIRATORY_TRACT | Status: DC | PRN

## 2012-08-31 MED ORDER — POTASSIUM CHLORIDE CRYS ER 20 MEQ PO TBCR
20.0000 meq | EXTENDED_RELEASE_TABLET | Freq: Two times a day (BID) | ORAL | Status: DC
  Administered 2012-08-31 – 2012-09-02 (×4): 20 meq via ORAL
  Filled 2012-08-31 (×4): qty 1

## 2012-08-31 MED ORDER — ONDANSETRON HCL 4 MG PO TABS: 4.0000 mg | ORAL_TABLET | Freq: Four times a day (QID) | ORAL | Status: DC | PRN

## 2012-08-31 MED ORDER — SODIUM CHLORIDE 0.9 % IJ SOLN
3.0000 mL | Freq: Two times a day (BID) | INTRAMUSCULAR | Status: DC
  Administered 2012-08-31 – 2012-09-01 (×2): 3 mL via INTRAVENOUS
  Filled 2012-08-31 (×3): qty 3

## 2012-08-31 MED ORDER — TRAZODONE HCL 50 MG PO TABS: 25.0000 mg | ORAL_TABLET | Freq: Every evening | ORAL | Status: DC | PRN

## 2012-08-31 MED ORDER — ADENOSINE 6 MG/2ML IV SOLN
6.0000 mg | Freq: Once | INTRAVENOUS | Status: AC
  Administered 2012-08-31: 6 mg via INTRAVENOUS
  Filled 2012-08-31: qty 2

## 2012-08-31 MED ORDER — DILTIAZEM HCL 60 MG PO TABS
60.0000 mg | ORAL_TABLET | Freq: Four times a day (QID) | ORAL | Status: DC
  Administered 2012-08-31 – 2012-09-01 (×4): 60 mg via ORAL
  Filled 2012-08-31 (×4): qty 1

## 2012-08-31 MED ORDER — ONDANSETRON HCL 4 MG/2ML IJ SOLN: 4.0000 mg | Freq: Four times a day (QID) | INTRAMUSCULAR | Status: DC | PRN

## 2012-08-31 NOTE — ED Provider Notes (Addendum)
History    This chart was scribed for [name] Lyons, MD, MD by Smitty Pluck. The patient was seen in room APA04 and the patient's care was started at 2:54PM.   CSN: [id number]  Arrival date & time 08/31/12  1432   None     Chief Complaint  Patient presents with  . Tachycardia    (Consider location/radiation/quality/duration/timing/severity/associated sxs/prior treatment) The history is provided by the patient. No language interpreter was used.   [name] [name] is a 77 y.o. female who presents to the Emergency Department from [facility] complaining of moderate tachycardia onset today. Pt reports that she feels weak and dizzy onset today this AM. She reports that she has had this happen in the past.  Pt was checked by nurse at facility PTA and given inhaler with minimal relief. Denies any other pain.  Cardiologist is Dr. Tresa Endo   Past Medical History  Diagnosis Date  . Hypertension   . Hyperlipidemia   . COPD (chronic obstructive pulmonary disease)   . Arthritis   . Depression with anxiety   . GERD (gastroesophageal reflux disease)   . Hiatal hernia   . Atrial fibrillation   . Productive cough   . Leg pain   . AAA (abdominal aortic aneurysm)   . CAD (coronary artery disease)   . Chronic kidney disease     Past Surgical History  Procedure Date  . Kidney stone surgery   . Cystectomy     cyst removal on pituitary gland    Family History  Problem Relation Age of Onset  . Heart disease Mother   . Hyperlipidemia Mother   . Hypertension Mother   . Stroke Mother   . Cancer Brother     History  Substance Use Topics  . Smoking status: Former Smoker    Types: Cigarettes  . Smokeless tobacco: Former Neurosurgeon    Quit date: 01/11/2008  . Alcohol Use: No    OB History    Grav Para Term Preterm Abortions TAB SAB Ect Mult Living                  Review of Systems  All other systems reviewed and are negative.  10 Systems reviewed and all are negative for acute  change except as noted in the HPI.    Allergies  Asa; Lidocaine; Nsaids; and Sulfonamide derivatives  Home Medications   Current Outpatient Rx  Name Route Sig Dispense Refill  . ACETAMINOPHEN 500 MG PO TABS Oral Take 500 mg by mouth every 4 (four) hours as needed. For pain    . ALBUTEROL SULFATE HFA 108 (90 BASE) MCG/ACT IN AERS Inhalation Inhale 2 puffs into the lungs every 4 (four) hours as needed. For wheezing    . ALPRAZOLAM 1 MG PO TABS Oral Take 1 tablet (1 mg total) by mouth 3 (three) times daily. 15 tablet 0  . ANTACID LIQUID PO Oral Take 30 mLs by mouth every 2 (two) hours as needed. Heartburn    . ENSURE COMPLETE PO LIQD Oral Take 237 mLs by mouth 2 (two) times daily between meals.    Marland Kitchen HYDROCODONE-ACETAMINOPHEN 10-325 MG PO TABS Oral Take 1 tablet by mouth every 6 (six) hours as needed for pain. 20 tablet 0  . OMEPRAZOLE 20 MG PO CPDR Oral Take 20 mg by mouth daily.      Marland Kitchen SIMETHICONE 80 MG PO CHEW Oral Chew 80 mg by mouth 3 (three) times daily.       BP  111/84  Pulse 68  Temp 98 F (36.7 C) (Oral)  Resp 22  Ht 5\' 5"  (1.651 m)  Wt 180 lb (81.647 kg)  BMI 29.95 kg/m2  SpO2 83%  Physical Exam  Nursing note and vitals reviewed. Constitutional: She is oriented to person, place, and time. She appears well-developed and well-nourished.  HENT:  Head: Normocephalic and atraumatic.  Eyes: Conjunctivae are normal.  Cardiovascular: Tachycardia present.   Pulmonary/Chest: Effort normal and breath sounds normal. No respiratory distress.  Abdominal: Soft. She exhibits no distension. There is no tenderness.  Musculoskeletal: She exhibits edema (bilateral +1 edema ower ext ).  Neurological: She is alert and oriented to person, place, and time.  Skin: Skin is warm and dry.  Psychiatric: She has a normal mood and affect. Her behavior is normal.    ED Course  Procedures (including critical care time) DIAGNOSTIC STUDIES: Oxygen Saturation is 97% on [place], normal by my  interpretation.    COORDINATION OF CARE: 2:59 PM Discussed pt ED treatment with pt  2:59 PM Ordered:   Medications  adenosine (ADENOCARD) 6 MG/2ML injection 6 mg (6 mg Intravenous Given 08/31/12 1508)      Labs Reviewed  CBC - Abnormal; Notable for the following:    RBC 3.55 (*)     Hemoglobin 10.6 (*)     HCT 34.4 (*)     RDW 20.2 (*)     All other components within normal limits  BASIC METABOLIC PANEL - Abnormal; Notable for the following:    Glucose, Bld 112 (*)     GFR calc non Af Amer 53 (*)     GFR calc Af Amer 62 (*)     All other components within normal limits  TROPONIN I   Chest Portable 1 View  08/31/2012  *RADIOLOGY REPORT*  Clinical Data: Hypertension.  PORTABLE CHEST - 1 VIEW  Comparison: 08/17/2012  Findings: Heart is borderline in size.  Lungs are clear.  Mild hyperinflation of the lungs.  No effusions or acute bony abnormality.  IMPRESSION: No active cardiopulmonary disease.   Original Report Authenticated By: Cyndie Chime, M.D.      No diagnosis found.   Date: 08/31/2012  Rate: 146  Rhythm: supraventricular tachycardia (SVT)  QRS Axis: normal  Intervals: normal  ST/T Wave abnormalities: nonspecific T wave changes  Conduction Disutrbances:none  Narrative Interpretation:   Old EKG Reviewed: svt new    MDM  The patient presents with weakness and racing heart.  EKG shows svt which was converted to sinus with a single 6 mg dose of adenosine.  The labs look okay and the patient is feeling better.  She has remained in a sinus rhythm while here and appears stable for discharge.  I informed the patient, son, and daughter of the diagnosis and that should this recur, the possibility of ablation.  To return prn.  Prior to discharge, the patient returned back into svt.  I have consulted Dr. Juanetta Gosling for admission.      I personally performed the services described in this documentation, which was scribed in my presence. The recorded information has been  reviewed and considered.          [name] Lyons, MD 08/31/12 1610  [name] Lyons, MD 08/31/12 [phone]

## 2012-08-31 NOTE — ED Notes (Addendum)
Refer to strips for conversion during adenosine push.  Pt HR now 92 bpm.  NSR on monitor.

## 2012-08-31 NOTE — ED Notes (Signed)
ekg completed, reading svt at rate of 146. Given to dr. Judd Lien.

## 2012-08-31 NOTE — ED Notes (Signed)
Pt was being discharged, removed IV, pt went into SVT on monitor.  Dr. Judd Lien was notified, IV was restarted and adenosine ordered.  EDP coming to bedside for adenosine administration.  Pt alert and oriented.  Pt reports some pain between shoulder blades but denies SOB.  Dr. Judd Lien aware.

## 2012-08-31 NOTE — ED Notes (Signed)
Pt resident at [facility] - nurse reports pt became "short winded" around 1100 today.  Gave puff of inhaler with minimal relief.  Checked HR around 1415 and HR was 154.  Pt denies cp.  C/o upper back pain between shoulder blades.

## 2012-09-01 ENCOUNTER — Encounter (HOSPITAL_COMMUNITY): Payer: Self-pay | Admitting: Cardiology

## 2012-09-01 DIAGNOSIS — I2510 Atherosclerotic heart disease of native coronary artery without angina pectoris: Secondary | ICD-10-CM

## 2012-09-01 DIAGNOSIS — I498 Other specified cardiac arrhythmias: Secondary | ICD-10-CM | POA: Diagnosis not present

## 2012-09-01 DIAGNOSIS — D649 Anemia, unspecified: Secondary | ICD-10-CM | POA: Diagnosis not present

## 2012-09-01 DIAGNOSIS — I10 Essential (primary) hypertension: Secondary | ICD-10-CM | POA: Diagnosis present

## 2012-09-01 DIAGNOSIS — I739 Peripheral vascular disease, unspecified: Secondary | ICD-10-CM | POA: Diagnosis present

## 2012-09-01 DIAGNOSIS — J449 Chronic obstructive pulmonary disease, unspecified: Secondary | ICD-10-CM | POA: Diagnosis present

## 2012-09-01 DIAGNOSIS — I714 Abdominal aortic aneurysm, without rupture: Secondary | ICD-10-CM | POA: Diagnosis present

## 2012-09-01 DIAGNOSIS — I7090 Unspecified atherosclerosis: Secondary | ICD-10-CM

## 2012-09-01 DIAGNOSIS — F17201 Nicotine dependence, unspecified, in remission: Secondary | ICD-10-CM | POA: Insufficient documentation

## 2012-09-01 DIAGNOSIS — D352 Benign neoplasm of pituitary gland: Secondary | ICD-10-CM | POA: Insufficient documentation

## 2012-09-01 LAB — BASIC METABOLIC PANEL
Chloride: 113 mEq/L — ABNORMAL HIGH (ref 96–112)
GFR calc Af Amer: 71 mL/min — ABNORMAL LOW (ref >90)
GFR calc non Af Amer: 61 mL/min — ABNORMAL LOW (ref >90)
Potassium: 3.7 mEq/L (ref 3.5–5.1)
Sodium: 144 mEq/L (ref 135–145)

## 2012-09-01 LAB — TROPONIN I
Troponin I: <0.3 ng/mL (ref <0.30)
Troponin I: <0.3 ng/mL (ref <0.30)
Troponin I: <0.3 ng/mL (ref <0.30)

## 2012-09-01 LAB — TSH: TSH: 4.782 u[IU]/mL — ABNORMAL HIGH (ref 0.350–4.500)

## 2012-09-01 MED ORDER — DILTIAZEM HCL ER COATED BEADS 240 MG PO CP24
240.0000 mg | ORAL_CAPSULE | Freq: Every day | ORAL | Status: DC
  Administered 2012-09-02: 240 mg via ORAL
  Filled 2012-09-01: qty 1

## 2012-09-01 MED ORDER — ENOXAPARIN SODIUM 40 MG/0.4ML ~~LOC~~ SOLN
40.0000 mg | SUBCUTANEOUS | Status: DC
  Administered 2012-09-01: 40 mg via SUBCUTANEOUS
  Filled 2012-09-01 (×2): qty 0.4

## 2012-09-01 MED FILL — Perflutren Lipid Microsphere IV Susp 1.1 MG/ML: INTRAVENOUS | Qty: 10 | Status: AC

## 2012-09-01 NOTE — Care Management Note (Unsigned)
    Page 1 of 1   09/01/2012     4:36:25 PM   CARE MANAGEMENT NOTE 09/01/2012  Patient:  [name] [name], [name] [name]   Account Number:  [id number]  Date Initiated:  09/01/2012  Documentation initiated by:  Anibal Henderson  Subjective/Objective Assessment:   Admitted from [facility] with SVT     Action/Plan:   CSW aware   Anticipated DC Date:  09/04/2012   Anticipated DC Plan:  SKILLED NURSING FACILITY  In-house referral  Clinical Social Worker         Choice offered to / List presented to:             Status of service:   Medicare Important Message given?   (If response is "NO", the following Medicare IM given date fields will be blank) Date Medicare IM given:   Date Additional Medicare IM given:    Discharge Disposition:    Per UR Regulation:    If discussed at Long Length of Stay Meetings, dates discussed:    Comments:  09/01/12  1545 Anibal Henderson RN

## 2012-09-01 NOTE — H&P (Signed)
[name] [name] MRN: [id number] DOB/AGE: [birth year] 77 y.o. Primary Care Physician:Emiel Kielty L, MD Admit date: 08/31/2012 Chief Complaint: Tachycardia HPI: This is a 77 year old Caucasian female who has been living at the  [facility] center for rehabilitation. When she was starting physical therapy she was noted to to have a heart rate of about 150. She was sent to the emergency room and found to have an SVT at about 150 and was given adenosine and converted to sinus rhythm. It was felt that she could be sent back to the nursing home but then she developed the SVT again and required repeat adenosine and she was brought into the hospital because of that. She says otherwise she feels well. She has multiple medical problems as noted.  Past Medical History  Diagnosis Date  . Hypertension   . Hyperlipidemia   . COPD (chronic obstructive pulmonary disease)   . Arthritis   . Depression with anxiety   . GERD (gastroesophageal reflux disease)   . Hiatal hernia   . Atrial fibrillation   . Productive cough   . Leg pain   . AAA (abdominal aortic aneurysm)   . CAD (coronary artery disease)   . Chronic kidney disease    Past Surgical History  Procedure Date  . Kidney stone surgery   . Cystectomy     cyst removal on pituitary gland        Family History  Problem Relation Age of Onset  . Heart disease Mother   . Hyperlipidemia Mother   . Hypertension Mother   . Stroke Mother   . Cancer Brother     Social History:  reports that she has quit smoking. Her smoking use included Cigarettes. She quit smokeless tobacco use about 4 years ago. She reports that she does not drink alcohol or use illicit drugs.   Allergies:  Allergies  Allergen Reactions  . Asa (Aspirin) Other (See Comments)    "my ears bleed"  . Lidocaine Other (See Comments)    unknown  . Nsaids Other (See Comments)    unknown  . Sulfonamide Derivatives Other (See Comments)    unknown    Medications Prior to Admission    Medication Sig Dispense Refill  . acetaminophen (TYLENOL) 500 MG tablet Take 500 mg by mouth every 4 (four) hours as needed. For pain      . albuterol (PROVENTIL HFA;VENTOLIN HFA) 108 (90 BASE) MCG/ACT inhaler Inhale 2 puffs into the lungs every 4 (four) hours as needed. For wheezing      . ALPRAZolam (XANAX) 1 MG tablet Take 1 tablet (1 mg total) by mouth 3 (three) times daily.  15 tablet  0  . Alum & Mag Hydroxide-Simeth (ANTACID LIQUID PO) Take 30 mLs by mouth every 2 (two) hours as needed. Heartburn      . feeding supplement (ENSURE COMPLETE) LIQD Take 237 mLs by mouth 2 (two) times daily between meals.      Marland Kitchen HYDROcodone-acetaminophen (NORCO) 10-325 MG per tablet Take 1 tablet by mouth every 6 (six) hours as needed for pain.  20 tablet  0  . omeprazole (PRILOSEC) 20 MG capsule Take 20 mg by mouth daily.        . simethicone (MYLICON) 80 MG chewable tablet Chew 80 mg by mouth 3 (three) times daily.            WUJ:WJXBJ from the symptoms mentioned above,there are no other symptoms referable to all systems reviewed.  Physical Exam: Blood pressure 100/60, pulse 67, temperature  98 F (36.7 C), temperature source Oral, resp. rate 20, height 5\' 5"  (1.651 m), weight 88.3 kg (194 lb 10.7 oz), SpO2 99.00%. She is awake and alert. She seems tired. Her pupils are reactive. Her nose and throat are clear. Her neck is supple. Her chest is clear. Her heart is regular. Her abdomen is soft. Extremities showed no edema. Central nervous system examination is grossly intact    Basename 08/31/12 1446  WBC 8.4  NEUTROABS --  HGB 10.6*  HCT 34.4*  MCV 96.9  PLT 364    Basename 09/01/12 0518 08/31/12 1446  NA 144 141  K 3.7 3.6  CL 113* 107  CO2 24 23  GLUCOSE 90 112*  BUN 10 11  CREATININE 0.90 1.01  CALCIUM 8.4 9.0  MG -- --  lablast2(ast:2,ALT:2,alkphos:2,bilitot:2,prot:2,albumin:2)@    No results found for this or any previous visit (from the past 240 hour(s)).   Ct Abdomen Pelvis  Wo Contrast  08/04/2012  *RADIOLOGY REPORT*  Clinical Data: Follow-up abdominal aortic aneurysm.  CT ABDOMEN AND PELVIS WITHOUT CONTRAST  Technique:  Multidetector CT imaging of the abdomen and pelvis was performed following the standard protocol without intravenous contrast.  Comparison: 12/04/2011  Findings: Infrarenal abdominal aortic aneurysm measures 5.4 x 5.4 cm, without significant change in size since previous study.  No evidence of aneurysm leak or rupture.  No evidence of iliac artery aneurysm.  Tiny nonobstructing left intrarenal calculus again noted measuring less than 5 mm.  Bilateral renal parenchymal atrophy again demonstrated, however there is no evidence of renal mass or hydronephrosis.  The other abdominal parenchymal organs have a normal appearance on this noncontrast study. Small calcified gallstones seen in the gallbladder fundus, however there is no evidence of cholecystitis.  Uterus and adnexa are unremarkable in appearance.  No soft tissue masses or lymphadenopathy identified.  No evidence of inflammatory process or abnormal fluid collections.  Severe bilateral hip DJD also noted with chronic collapse of the articular surface of both femoral heads.  IMPRESSION:  1.  Stable 5.4 cm infrarenal abdominal aortic aneurysm.  No evidence of aneurysm leak or rupture. 2.  Nonobstructing left nephrolithiasis and cholelithiasis.  Original Report Authenticated By: Danae Orleans, M.D.   Dg Abd 1 View  08/14/2012  *RADIOLOGY REPORT*  Clinical Data: Abdominal distention.  ABDOMEN - 1 VIEW  Comparison: 08/11/2012  Findings: Supine images of the abdomen demonstrate gaseous distention of the stomach.  There are still gas-filled loops of small bowel.  Overall, there is decreased small bowel distention. Again noted is sclerosis and abnormality of the femoral heads. There is high-density material contrast within the rectum.  The nasogastric tube has been removed.  Large gas-filled structure in the right lower  quadrant is nonspecific and could be associated with the cecum. Limited evaluation for free air on the supine images.  IMPRESSION: Decreased small bowel gaseous distention.  Slightly increased gaseous distention of the stomach since removal of the nasogastric tube.   Original Report Authenticated By: Richarda Overlie, M.D.    Ct Head Wo Contrast  08/15/2012  *RADIOLOGY REPORT*  Clinical Data: Acute encephalopathy  CT HEAD WITHOUT CONTRAST  Technique:  Contiguous axial images were obtained from the base of the skull through the vertex without contrast.  Comparison: 09/11/2008  Findings: Motion degraded images.  No evidence of parenchymal hemorrhage or extra-axial fluid collection. No mass lesion, mass effect, or midline shift.  No CT evidence of acute infarction.  Subcortical white matter and periventricular small vessel ischemic changes.  Intracranial atherosclerosis.  Global cortical atrophy.  No ventriculomegaly.  The visualized paranasal sinuses are essentially clear. The mastoid air cells are unopacified.  No evidence of calvarial fracture.  IMPRESSION: No evidence of acute intracranial abnormality.  Atrophy with small vessel ischemic changes and intracranial atherosclerosis.   Original Report Authenticated By: Charline Bills, M.D.    Ct Angio Chest Pe W/cm &/or Wo Cm  08/17/2012  *RADIOLOGY REPORT*  Clinical Data: Possible right ventricular outflow tract thrombus noted on recent two-dimensional echocardiogram.  Evaluate for potential RVOT thrombus or pulmonary embolism.  CT ANGIOGRAPHY CHEST  Technique:  Multidetector CT imaging of the chest using the standard protocol during bolus administration of intravenous contrast. Multiplanar reconstructed images including MIPs were obtained and reviewed to evaluate the vascular anatomy.  Contrast: OMNIPAQUE IOHEXOL 350 MG/ML SOLN  Comparison: Chest c t 01/18/2007.  Findings:  Mediastinum: Study is severely limited by patient respiratory motion.  Because of this,  accurate assessment for distal segmental and subsegmental sized emboli is not possible on this examination. Additionally, in the right lower lobe, even proximal segmental sized emboli cannot be excluded.  With these limitations in mind, there is no evidence of central, lobe are or proximal segmental sized embolism.  Additionally, there is no filling defects identified within the right ventricle, or the right ventricular outflow tract to suggest thrombus. Heart size is normal. There is no significant pericardial fluid, thickening or pericardial calcification. There is atherosclerosis of the thoracic aorta, the great vessels of the mediastinum and the coronary arteries, including calcified atherosclerotic plaque in the left main, left anterior descending, left circumflex and right coronary arteries. Extensive atheromatous plaque is identified throughout the descending thoracic aorta, with multiple extensions impinging upon the lumen of the aorta (this may be increased risk for embolic event associated with any future catheterization attempts). A nasogastric tube extends to the gastroesophageal junction. Esophagus is unremarkable in appearance. No pathologically enlarged mediastinal or hilar lymph nodes. Right upper extremity PICC is in place with the tip coiled in the right subclavian vein adjacent to the junction with the right internal jugular vein.  Lungs/Pleura: No consolidative airspace disease.  No pleural effusions.  Extensive respiratory motion limits sensitivity for detection of small pulmonary nodules.  No large suspicious appearing pulmonary nodules or masses are confidently identified.  Upper Abdomen: Unremarkable.  Musculoskeletal: There are no aggressive appearing lytic or blastic lesions noted in the visualized portions of the skeleton.  IMPRESSION: 1.  Exceedingly limited examination secondary to respiratory motion with no obvious pulmonary embolism, as above. 2.  No filling defect in the right  ventricular outflow tract to suggest RVOT thrombus. 3.  The patient's right upper extremity PICC is coiled upon itself with the tip in the subclavian vein adjacent to the junction with the right internal jugular vein. 4. Severe atherosclerosis, including left main and three-vessel coronary artery disease.  Assessment for potential risk factor modification, dietary therapy or pharmacologic therapy may be warranted, if clinically indicated. 5.  Other incidental findings, as above.   Original Report Authenticated By: Florencia Reasons, M.D.    Chest Portable 1 View  08/31/2012  *RADIOLOGY REPORT*  Clinical Data: Hypertension.  PORTABLE CHEST - 1 VIEW  Comparison: 08/17/2012  Findings: Heart is borderline in size.  Lungs are clear.  Mild hyperinflation of the lungs.  No effusions or acute bony abnormality.  IMPRESSION: No active cardiopulmonary disease.   Original Report Authenticated By: Cyndie Chime, M.D.    Dg Chest [facility]  08/17/2012  *RADIOLOGY REPORT*  Clinical Data: PICC repositioning.  PORTABLE CHEST - 1 VIEW  Comparison: CT scan dated 08/17/2012 and chest x-ray dated 05/23/2012  Findings: PICC line is looped in the right subclavian vein and the tip is in the right subclavian vein.  NG tube tip is below the diaphragm.  Proximal side hole is in the distal esophagus.  Heart size and vascularity are normal and the lungs are clear. Slight thoracic scoliosis.  IMPRESSION: PICC line and and the its tip are looped in the right subclavian vein.  Clear lungs.   Original Report Authenticated By: Gwynn Burly, M.D.    Dg Abd 2 Views  08/11/2012  *RADIOLOGY REPORT*  Clinical Data: Small bowel obstruction  ABDOMEN - 2 VIEW  Comparison: 08/10/2012  Findings: Persistent distended small bowel loops with air-fluid levels consistent with small bowel obstruction.  No free abdominal air.  IMPRESSION:  Again noted distended small bowel loops with air-fluid levels consistent with small bowel obstruction.    Original Report Authenticated By: Natasha Mead, M.D.    Dg Abd 2 Views  08/10/2012  *RADIOLOGY REPORT*  Clinical Data: Abdominal distention, nausea.  ABDOMEN - 2 VIEW  Comparison:   the previous day's study  Findings: No free air on the left lateral decubitus radiograph. Multiple dilated small bowel loops throughout the abdomen with fluid levels on the decubitus film.  Residual contrast in the decompressed colon. Nasogastric tube extends into the nondilated stomach.  IMPRESSION:  1.  Persistent   small bowel obstruction.  No free air.   Original Report Authenticated By: Osa Craver, M.D.    Dg Abd Portable 1v  08/09/2012  *RADIOLOGY REPORT*  Clinical Data: Abdominal distention  PORTABLE ABDOMEN - 1 VIEW  Comparison: 08/18  Findings: There is persistent small bowel dilatation, similar to the previous exam, consistent with small bowel obstruction.  Small amount of gas present in the colon.  Nasogastric tube has its tip in the stomach.  IMPRESSION: Persistent small bowel obstruction pattern.   Original Report Authenticated By: Thomasenia Sales, M.D. ( 08/09/2012 07:48:04 )    Dg Abd Portable 2v  08/16/2012  *RADIOLOGY REPORT*  Clinical Data: Abdominal pain.  Follow up ileus.  PORTABLE ABDOMEN - 2 VIEW  Comparison: 08/15/2012.  Findings: Nasogastric tube tip is at the gastroesophageal junction. Stomach is decompressed when compared with yesterday's exam.  There is persistent dilatation of bowel in the low central abdomen.  No free air.  Severe degenerative changes are seen in the hips with flattening of the left humeral head.  IMPRESSION: Improving gaseous distention of bowel.   Original Report Authenticated By: Reyes Ivan, M.D.    Dg Abd Portable 2v  08/15/2012  *RADIOLOGY REPORT*  Clinical Data: Evaluate small bowel obstruction or ileus.  PORTABLE ABDOMEN - 2 VIEW  Comparison: 08/14/2012  Findings: Supine and left lower decubitus images were obtained. There is no evidence to suggest free air.   There is gas within the right colon.  There is persistent gaseous distention of the stomach.  There are still dilated gas-filled loops of small bowel. The contrast in the rectum has apparently passed and there is now gas in the rectum.  Chronic changes involving both hips.  IMPRESSION: There is persistent gaseous distention of the stomach and bowel loops.  There appears to be increased gas within the colon. Findings are more suggestive for an ileus pattern rather than an obstruction at this time.   Original Report Authenticated By: ADAM  Lowella Dandy, M.D.    Dg Abd Portable 2v  08/08/2012  *RADIOLOGY REPORT*  Clinical Data: Evaluate small bowel obstruction.  Question worsening.  PORTABLE ABDOMEN - 2 VIEW  Comparison: 08/07/2012  Findings: Nasogastric tube tip overlies the level of the stomach. There is persistent significant dilatation of small bowel loops. No free intraperitoneal air on left lateral decubitus view.  There is persistent contrast, likely within the colon, showing little interval progression since previous exam.  IMPRESSION: Persistent high-grade small bowel obstruction.  Original Report Authenticated By: Patterson Hammersmith, M.D.   Dg Abd Portable 2v  08/07/2012  *RADIOLOGY REPORT*  Clinical Data: Abdominal distention and decreased bowel sounds.  PORTABLE ABDOMEN - 2 VIEW  Comparison: CT abdomen and pelvis 08/04/2012.  Findings: Multiple dilated loops of small bowel measuring up to 5.8 cm are identified.  No free intraperitoneal air is identified.  IMPRESSION: Bowel gas pattern compatible with small bowel obstruction.  Original Report Authenticated By: Bernadene Bell. Maricela Curet, M.D.   Impression: She has supraventricular tachycardia. She has multiple other medical problems. She does not have any evidence of an acute myocardial infarction. Active Problems:  * No active hospital problems. *      Plan: I put her on by mouth Cardizem. When she was transferred from the acute care hospital to the  skilled care facility it does not appear to me that all of her medications were continued. I will  ask for cardiology consultation      Virgin Zellers L Pager [phone]  09/01/2012, 8:06 AM

## 2012-09-01 NOTE — Progress Notes (Addendum)
Subjective: She continued to have episodes of SVT overnight. She is on Cardizem which is apparently not effective. She could be put on a beta blocker but she does have pretty severe COPD. I have requested cardiology consultation. She has no other new complaints. She denies chest pain.  Objective: Vital signs in last 24 hours: Temp:  [97.9 F (36.6 C)-98.7 F (37.1 C)] 98 F (36.7 C) (09/11 0536) Pulse Rate:  [67-147] 67  (09/11 0536) Resp:  [16-22] 20  (09/11 0536) BP: (100-135)/(60-85) 100/60 mmHg (09/11 0536) SpO2:  [83 %-100 %] 99 % (09/11 0536) FiO2 (%):  [2 %] 2 % (09/10 1706) Weight:  [81.647 kg (180 lb)-88.3 kg (194 lb 10.7 oz)] 88.3 kg (194 lb 10.7 oz) (09/11 0536) Weight change:  Last BM Date: 08/30/12  Intake/Output from previous day: 09/10 0701 - 09/11 0700 In: 687.5 [I.V.:687.5] Out: -   PHYSICAL EXAM General appearance: alert, cooperative and no distress Resp: clear to auscultation bilaterally Cardio: regular rate and rhythm, S1, S2 normal, no murmur, click, rub or gallop and Heart rate now about 90 but she's been as high as 140 GI: soft, non-tender; bowel sounds normal; no masses,  no organomegaly Extremities: extremities normal, atraumatic, no cyanosis or edema  Lab Results:    Basic Metabolic Panel:  Basename 09/01/12 0518 08/31/12 1446  NA 144 141  K 3.7 3.6  CL 113* 107  CO2 24 23  GLUCOSE 90 112*  BUN 10 11  CREATININE 0.90 1.01  CALCIUM 8.4 9.0  MG -- --  PHOS -- --   Liver Function Tests: No results found for this basename: AST:2,ALT:2,ALKPHOS:2,BILITOT:2,PROT:2,ALBUMIN:2 in the last 72 hours No results found for this basename: LIPASE:2,AMYLASE:2 in the last 72 hours No results found for this basename: AMMONIA:2 in the last 72 hours CBC:  Basename 08/31/12 1446  WBC 8.4  NEUTROABS --  HGB 10.6*  HCT 34.4*  MCV 96.9  PLT 364   Cardiac Enzymes:  Basename 08/31/12 1446  CKTOTAL --  CKMB --  CKMBINDEX --  TROPONINI <0.30    BNP: No results found for this basename: PROBNP:3 in the last 72 hours D-Dimer: No results found for this basename: DDIMER:2 in the last 72 hours CBG: No results found for this basename: GLUCAP:6 in the last 72 hours Hemoglobin A1C: No results found for this basename: HGBA1C in the last 72 hours Fasting Lipid Panel: No results found for this basename: CHOL,HDL,LDLCALC,TRIG,CHOLHDL,LDLDIRECT in the last 72 hours Thyroid Function Tests:  Basename 08/31/12 1446  TSH 4.782*  T4TOTAL --  FREET4 --  T3FREE --  THYROIDAB --   Anemia Panel: No results found for this basename: VITAMINB12,FOLATE,FERRITIN,TIBC,IRON,RETICCTPCT in the last 72 hours Coagulation: No results found for this basename: LABPROT:2,INR:2 in the last 72 hours Urine Drug Screen: Drugs of Abuse  No results found for this basename: labopia, cocainscrnur, labbenz, amphetmu, thcu, labbarb    Alcohol Level: No results found for this basename: ETH:2 in the last 72 hours Urinalysis: No results found for this basename: COLORURINE:2,APPERANCEUR:2,LABSPEC:2,PHURINE:2,GLUCOSEU:2,HGBUR:2,BILIRUBINUR:2,KETONESUR:2,PROTEINUR:2,UROBILINOGEN:2,NITRITE:2,LEUKOCYTESUR:2 in the last 72 hours Misc. Labs:  ABGS No results found for this basename: PHART,PCO2,PO2ART,TCO2,HCO3 in the last 72 hours CULTURES No results found for this or any previous visit (from the past 240 hour(s)). Studies/Results: Chest Portable 1 View  08/31/2012  *RADIOLOGY REPORT*  Clinical Data: Hypertension.  PORTABLE CHEST - 1 VIEW  Comparison: 08/17/2012  Findings: Heart is borderline in size.  Lungs are clear.  Mild hyperinflation of the lungs.  No effusions or acute bony  abnormality.  IMPRESSION: No active cardiopulmonary disease.   Original Report Authenticated By: Cyndie Chime, M.D.     Medications:  Prior to Admission:  Prescriptions prior to admission  Medication Sig Dispense Refill  . acetaminophen (TYLENOL) 500 MG tablet Take 500 mg by mouth  every 4 (four) hours as needed. For pain      . albuterol (PROVENTIL HFA;VENTOLIN HFA) 108 (90 BASE) MCG/ACT inhaler Inhale 2 puffs into the lungs every 4 (four) hours as needed. For wheezing      . ALPRAZolam (XANAX) 1 MG tablet Take 1 tablet (1 mg total) by mouth 3 (three) times daily.  15 tablet  0  . Alum & Mag Hydroxide-Simeth (ANTACID LIQUID PO) Take 30 mLs by mouth every 2 (two) hours as needed. Heartburn      . feeding supplement (ENSURE COMPLETE) LIQD Take 237 mLs by mouth 2 (two) times daily between meals.      Marland Kitchen HYDROcodone-acetaminophen (NORCO) 10-325 MG per tablet Take 1 tablet by mouth every 6 (six) hours as needed for pain.  20 tablet  0  . omeprazole (PRILOSEC) 20 MG capsule Take 20 mg by mouth daily.        . simethicone (MYLICON) 80 MG chewable tablet Chew 80 mg by mouth 3 (three) times daily.        Scheduled:   . sodium chloride   Intravenous STAT  . adenosine (ADENOCARD) IV  6 mg Intravenous Once  . adenosine  6 mg Intravenous Once  . ALPRAZolam  1 mg Oral TID  . diltiazem  60 mg Oral Q6H  . enoxaparin (LOVENOX) injection  30 mg Subcutaneous Q24H  . feeding supplement  237 mL Oral BID BM  . pantoprazole  40 mg Oral Q1200  . potassium chloride  20 mEq Oral BID  . sodium chloride  3 mL Intravenous Q12H   Continuous:   . sodium chloride 1,000 mL (09/01/12 0835)   XBJ:[id number], albuterol, alum & mag hydroxide-simeth, HYDROcodone-acetaminophen, ondansetron (ZOFRAN) IV, ondansetron, traZODone  Assesment: She has supraventricular tachycardia. This is with a rapid rate. She has multiple medical problems as noted. She has not responded to Cardizem. Active Problems:  * No active hospital problems. *     Plan: For cardiology consultation with [facility] cardiology for recommendations for further treatment. I will check another troponin    LOS: 1 day   Jeb Schloemer L 09/01/2012, 8:42 AM

## 2012-09-01 NOTE — Consult Note (Signed)
Patient Name: [name] [name]  MRN: [id number]  HPI: [name] [name] is an 77 y.o. femalereferred for consultation by Dr.Edward Patrice Paradise, MD for supraventricular tachycardia.  The patient has been followed for many years by Dr. Nicki Guadalajara of [facility] and was seen by him as recently as one year ago.  Cardiac catheterization in 2001 revealed coronary artery disease for which two-vessel percutaneous intervention with stents was performed.  She's done well since from a cardiac standpoint with negative subsequent catheterizations in the late 2001 and 2003 and negative stress nuclear study as recently as 4 years ago.  She has had intermittent episodes of chest discomfort, but has had no documented recurrent acute coronary syndromes.  Over at least the past decade, she has also suffered from paroxysmal atrial arrhythmias.  These have been variously described as PSVT, atrial fibrillation or atrial flutter.  A sustained episode occurred last night at a ventricular rate of 140 with no symptoms or appearance physiologic derangement.  Patient has been treated with sotalol in the past, but is currently receiving no antiarrhythmic therapy.  Past Medical History  Diagnosis Date  . Hypertension   . Hyperlipidemia   . COPD (chronic obstructive pulmonary disease)     multiple hospitalizations for exacerbations with acute bronchitis  . Degenerative joint disease     Bilateral hips  . Depression with anxiety   . GERD (gastroesophageal reflux disease)     Hiatal hernia  . Atrial arrhythmia     Atrial fibrillation and atrial flutter diagnosed in the past;?  PSVT  . AAA (abdominal aortic aneurysm)     infrarenal; 07/2012: Diameter of 5.4 cm  . Arteriosclerotic cardiovascular disease (ASCVD)     Cardiac cath in 03/2000->  80% LAD, 70-80% RCA;  stent x1 in the LAD and x2 in the RCA with excellent angiographic outcome; repeat catheterization in late 2001 and 2003 revealed no restenosis; EF-50%    . Tobacco abuse, in remission     Quit in 2006; total consumption of 50-75 pack years  . Pituitary microadenoma     transsphenoidal resection in 09/2007  . Nephrolithiasis    Past Surgical History  Procedure Date  . Kidney stone surgery   . Transphenoidal / transnasal hypophysectomy / resection pituitary tumor 09/2007   Family History  Problem Relation Age of Onset  . Heart disease Mother   . Hyperlipidemia Mother   . Hypertension Mother   . Stroke Mother   . Cancer Brother    Social History:  reports that she quit smoking about 7 years ago. Her smoking use included Cigarettes. She has a 75 pack-year smoking history. She quit smokeless tobacco use about 4 years ago. She reports that she does not drink alcohol or use illicit drugs.  Allergies:  Allergies  Allergen Reactions  . Asa (Aspirin) Other (See Comments)    "my ears bleed"  . Lidocaine Other (See Comments)    unknown  . Nsaids Other (See Comments)    unknown  . Sulfonamide Derivatives Other (See Comments)    unknown   Medications:  I have reviewed the patient's current medications. Prior to Admission:  Prescriptions prior to admission  Medication Sig Dispense Refill  . acetaminophen (TYLENOL) 500 MG tablet Take 500 mg by mouth every 4 (four) hours as needed. For pain      . albuterol (PROVENTIL HFA;VENTOLIN HFA) 108 (90 BASE) MCG/ACT inhaler Inhale 2 puffs into the lungs every 4 (four) hours as needed. For wheezing      .  ALPRAZolam (XANAX) 1 MG tablet Take 1 tablet (1 mg total) by mouth 3 (three) times daily.  15 tablet  0  . Alum & Mag Hydroxide-Simeth (ANTACID LIQUID PO) Take 30 mLs by mouth every 2 (two) hours as needed. Heartburn      . feeding supplement (ENSURE COMPLETE) LIQD Take 237 mLs by mouth 2 (two) times daily between meals.      Marland Kitchen HYDROcodone-acetaminophen (NORCO) 10-325 MG per tablet Take 1 tablet by mouth every 6 (six) hours as needed for pain.  20 tablet  0  . omeprazole (PRILOSEC) 20 MG capsule  Take 20 mg by mouth daily.        . simethicone (MYLICON) 80 MG chewable tablet Chew 80 mg by mouth 3 (three) times daily.        Results for orders placed during the hospital encounter of 08/31/12 (from the past 48 hour(s))  CBC     Status: Abnormal   Collection Time   08/31/12  2:46 PM      Component Value Range Comment   WBC 8.4  4.0 - 10.5 K/uL    RBC 3.55 (*) 3.87 - 5.11 MIL/uL    Hemoglobin 10.6 (*) 12.0 - 15.0 g/dL    HCT 16.1 (*) 09.6 - 46.0 %    MCV 96.9  78.0 - 100.0 fL    MCH 29.9  26.0 - 34.0 pg    MCHC 30.8  30.0 - 36.0 g/dL    RDW 04.5 (*) 40.9 - 15.5 %    Platelets 364  150 - 400 K/uL   BASIC METABOLIC PANEL     Status: Abnormal   Collection Time   08/31/12  2:46 PM      Component Value Range Comment   Sodium 141  135 - 145 mEq/L    Potassium 3.6  3.5 - 5.1 mEq/L    Chloride 107  96 - 112 mEq/L    CO2 23  19 - 32 mEq/L    Glucose, Bld 112 (*) 70 - 99 mg/dL    BUN 11  6 - 23 mg/dL    Creatinine, Ser 8.11  0.50 - 1.10 mg/dL    Calcium 9.0  8.4 - 91.4 mg/dL    GFR calc non Af Amer 53 (*) >90 mL/min    GFR calc Af Amer 62 (*) >90 mL/min   TROPONIN I     Status: Normal   Collection Time   08/31/12  2:46 PM      Component Value Range Comment   Troponin I <0.30  <0.30 ng/mL   TSH     Status: Abnormal   Collection Time   08/31/12  2:46 PM      Component Value Range Comment   TSH 4.782 (*) 0.350 - 4.500 uIU/mL   BASIC METABOLIC PANEL     Status: Abnormal   Collection Time   09/01/12  5:18 AM      Component Value Range Comment   Sodium 144  135 - 145 mEq/L    Potassium 3.7  3.5 - 5.1 mEq/L    Chloride 113 (*) 96 - 112 mEq/L    CO2 24  19 - 32 mEq/L    Glucose, Bld 90  70 - 99 mg/dL    BUN 10  6 - 23 mg/dL    Creatinine, Ser 7.82  0.50 - 1.10 mg/dL    Calcium 8.4  8.4 - 95.6 mg/dL    GFR calc non Af Amer 61 (*) >90  mL/min    GFR calc Af Amer 71 (*) >90 mL/min   TROPONIN I     Status: Normal   Collection Time   09/01/12  9:13 AM      Component Value Range  Comment   Troponin I <0.30  <0.30 ng/mL   TROPONIN I     Status: Normal   Collection Time   09/01/12  2:39 PM      Component Value Range Comment   Troponin I <0.30  <0.30 ng/mL     Chest Portable 1 View  08/31/2012  *RADIOLOGY REPORT*  Clinical Data: Hypertension.  PORTABLE CHEST - 1 VIEW  Comparison: 08/17/2012  Findings: Heart is borderline in size.  Lungs are clear.  Mild hyperinflation of the lungs.  No effusions or acute bony abnormality.  IMPRESSION: No active cardiopulmonary disease.   Original Report Authenticated By: Cyndie Chime, M.D.     Review of Systems: General: no anorexia, weight gain or weight loss Cardiac: no chest pain, chronic class III dyspnea on exertion with very limited exercise capacity and a sedentary lifestyle; denies orthopnea, PND,  palpitations, or syncope Respiratory: no cough, sputum production or hemoptysis GI: no nausea, abdominal pain, emesis, diarrhea or constipation Integument: no significant lesions Neurologic: No muscle weakness or paralysis; no speech disturbance; no headache  Physical Exam: Blood pressure 104/66, pulse 68, temperature 98.1 F (36.7 C), temperature source Oral, resp. rate 18, height 5\' 5"  (1.651 m), weight 88.3 kg (194 lb 10.7 oz), SpO2 99.00%. General-Well-developed; no acute distress; overweight HEENT-/AT; PERRL; EOM intact; conjunctiva and lids nl Neck-No JVD; no carotid bruits Endocrine-No thyromegaly Lungs-mild expiratory rhonchi; decreased breath sounds; resonant percussion; prolonged expiratory phase Cardiovascular- normal PMI; normal S1 and S2 Abdomen-BS normal; soft and non-tender without masses or organomegaly Musculoskeletal-No deformities, cyanosis or clubbing Neurologic-Nl cranial nerves; symmetric strength and tone Skin- Warm, no significant lesions Extremities-1+ distal pulses; 1/2+ ankle edema  Assessment/Plan:  Patient Active Problem List   Diagnosis Date Noted  . Hypertension   . COPD (chronic  obstructive pulmonary disease)   . Atrial arrhythmia   . AAA (abdominal aortic aneurysm)   . Arteriosclerotic cardiovascular disease (ASCVD)   . Tobacco abuse, in remission   . Pituitary microadenoma   . Pulmonary embolism 08/14/2012  . Hyperlipidemia 05/24/2012  . ARTHRITIS, HIPS, BILATERAL 06/06/2010  ASCVD:  No documented recurrent symptomatic coronary disease since 2001.  Patient likely would benefit from treatment with statins.  A lipid profile will be obtained and appropriate therapy instituted.  PSVT:  Arrhythmia has been diagnosed as atrial fibrillation and atrial flutter in the past, but the EKGs I can locate document long RP supraventricular tachycardia.  It is unclear what treatment modalities have been utilized were recommended in the past.  Radiofrequency ablation is likely feasible, but simple therapy with a calcium channel antagonist would be a reasonable first.  Beta blocker is not an ideal choice due to the patient's severe chronic obstructive pulmonary disease although she does not appear to have much bronchospasm at the present time.  Abdominal aortic aneurysm: Followed by Dr. Edilia Bo; due to multiple comorbidities I would not consider surgical repair unless and until the aneurysm reaches at least 6 cm.  Chronic obstructive pulmonary disease: Lung disease has stabilized since patient discontinued cigarette smoking.  Dr. Juanetta Gosling is managing this problem.  Patient's cardiology care has long been provided by [facility].  I have provided the initial consultation as requested, but suggest their service be called to provide  continuing cardiology care.  [place] Bing, MD 09/01/2012, 7:52 PM

## 2012-09-01 NOTE — Clinical Social Work Psychosocial (Signed)
Clinical Social Work Department BRIEF PSYCHOSOCIAL ASSESSMENT 09/01/2012  Patient:  [name] [name], [name] [name]     Account Number:  [id number]     Admit date:  08/31/2012  Clinical Social Worker:  Nancie Neas  Date/Time:  09/01/2012 09:15 AM  Referred by:  Physician  Date Referred:  09/01/2012 Referred for  SNF Placement   Other Referral:   Interview type:  Patient Other interview type:    PSYCHOSOCIAL DATA Living Status:  FACILITY Admitted from facility:  [facility] Level of care:  Skilled Nursing Facility Primary support name:  Marcial Pacas Primary support relationship to patient:  CHILD, ADULT Degree of support available:   supportive per pt    CURRENT CONCERNS Current Concerns  Post-Acute Placement   Other Concerns:    SOCIAL WORK ASSESSMENT / PLAN CSW met with pt at bedside. Pt alert and oriented and reports she has been a resident at [facility] for about a week. She had been at [facility] but was hospitalized and required a higher level of care per pt report. She states her son lives locally and she plans to move in with him once she completes her rehab. She requests CSW not to contact family but to just speak with her. Per Tami at [facility], pt is skilled level of care and okay to return at d/c pending bed availability at d/c. No FL2 needed for return according to Tami.   Assessment/plan status:   Other assessment/ plan:   Information/referral to community resources:    PATIENT'S/FAMILY'S RESPONSE TO PLAN OF CARE: Pt states [facility]NC has been "just fine but there is no place like home." She requests to return to [facility] at d/c and will discuss possibility of bed hold with social workers there. CSW will continue to follow.        Derenda Fennel, [place] [phone]

## 2012-09-01 NOTE — Progress Notes (Signed)
UR Chart Review Completed  

## 2012-09-02 ENCOUNTER — Inpatient Hospital Stay
Admission: RE | Admit: 2012-09-02 | Discharge: 2012-11-16 | Disposition: A | Discharge status: Skilled Nursing Facility | Payer: Medicaid Other | Source: Ambulatory Visit | Attending: Internal Medicine | Admitting: Internal Medicine

## 2012-09-02 DIAGNOSIS — F39 Unspecified mood [affective] disorder: Secondary | ICD-10-CM | POA: Diagnosis not present

## 2012-09-02 DIAGNOSIS — F411 Generalized anxiety disorder: Secondary | ICD-10-CM | POA: Diagnosis not present

## 2012-09-02 DIAGNOSIS — M129 Arthropathy, unspecified: Secondary | ICD-10-CM | POA: Diagnosis not present

## 2012-09-02 DIAGNOSIS — R000 Tachycardia, unspecified: Secondary | ICD-10-CM | POA: Diagnosis not present

## 2012-09-02 DIAGNOSIS — Z5189 Encounter for other specified aftercare: Secondary | ICD-10-CM | POA: Diagnosis not present

## 2012-09-02 DIAGNOSIS — D649 Anemia, unspecified: Secondary | ICD-10-CM | POA: Diagnosis not present

## 2012-09-02 DIAGNOSIS — I739 Peripheral vascular disease, unspecified: Secondary | ICD-10-CM | POA: Diagnosis not present

## 2012-09-02 DIAGNOSIS — G4700 Insomnia, unspecified: Secondary | ICD-10-CM | POA: Diagnosis not present

## 2012-09-02 DIAGNOSIS — F0391 Unspecified dementia with behavioral disturbance: Secondary | ICD-10-CM | POA: Diagnosis not present

## 2012-09-02 DIAGNOSIS — I2510 Atherosclerotic heart disease of native coronary artery without angina pectoris: Secondary | ICD-10-CM | POA: Diagnosis not present

## 2012-09-02 DIAGNOSIS — I714 Abdominal aortic aneurysm, without rupture: Secondary | ICD-10-CM | POA: Diagnosis not present

## 2012-09-02 DIAGNOSIS — J449 Chronic obstructive pulmonary disease, unspecified: Secondary | ICD-10-CM | POA: Diagnosis not present

## 2012-09-02 DIAGNOSIS — I10 Essential (primary) hypertension: Secondary | ICD-10-CM | POA: Diagnosis not present

## 2012-09-02 DIAGNOSIS — K56609 Unspecified intestinal obstruction, unspecified as to partial versus complete obstruction: Secondary | ICD-10-CM | POA: Diagnosis not present

## 2012-09-02 DIAGNOSIS — I499 Cardiac arrhythmia, unspecified: Secondary | ICD-10-CM | POA: Diagnosis not present

## 2012-09-02 LAB — LIPID PANEL: LDL Cholesterol: 94 mg/dL (ref 0–99)

## 2012-09-02 NOTE — Clinical Social Work Note (Signed)
Pt d/c today back to SNF. Bed available at [facility]. Pt and facility aware and agreeable. Pt to transfer with RN. Pt will notify family. D/C summary faxed. No FL2 needed per Tami.   [name] [name], [place] [phone]

## 2012-09-02 NOTE — Progress Notes (Signed)
Subjective: She says she feels better. Her heart rate is better. Cardiology consult is noted and appreciated  Objective: Vital signs in last 24 hours: Temp:  [98.1 F (36.7 C)-98.3 F (36.8 C)] 98.1 F (36.7 C) (09/12 0558) Pulse Rate:  [68-96] 96  (09/12 0558) Resp:  [16-18] 16  (09/12 0558) BP: (95-131)/(55-81) 131/76 mmHg (09/12 0558) SpO2:  [94 %-99 %] 94 % (09/12 0558) Weight:  [86.1 kg (189 lb 13.1 oz)] 86.1 kg (189 lb 13.1 oz) (09/12 0558) Weight change: 4.453 kg (9 lb 13.1 oz) Last BM Date: 09/01/12  Intake/Output from previous day: 09/11 0701 - 09/12 0700 In: 820 [P.O.:820] Out: 4 [Urine:4]  PHYSICAL EXAM General appearance: alert, cooperative and no distress Resp: clear to auscultation bilaterally Cardio: regular rate and rhythm, S1, S2 normal, no murmur, click, rub or gallop GI: soft, non-tender; bowel sounds normal; no masses,  no organomegaly Extremities: extremities normal, atraumatic, no cyanosis or edema  Lab Results:    Basic Metabolic Panel:  Basename 09/01/12 0518 08/31/12 1446  NA 144 141  K 3.7 3.6  CL 113* 107  CO2 24 23  GLUCOSE 90 112*  BUN 10 11  CREATININE 0.90 1.01  CALCIUM 8.4 9.0  MG -- --  PHOS -- --   Liver Function Tests: No results found for this basename: AST:2,ALT:2,ALKPHOS:2,BILITOT:2,PROT:2,ALBUMIN:2 in the last 72 hours No results found for this basename: LIPASE:2,AMYLASE:2 in the last 72 hours No results found for this basename: AMMONIA:2 in the last 72 hours CBC:  Basename 08/31/12 1446  WBC 8.4  NEUTROABS --  HGB 10.6*  HCT 34.4*  MCV 96.9  PLT 364   Cardiac Enzymes:  Basename 09/01/12 2036 09/01/12 1439 09/01/12 0913  CKTOTAL -- -- --  CKMB -- -- --  CKMBINDEX -- -- --  TROPONINI <0.30 <0.30 <0.30   BNP: No results found for this basename: PROBNP:3 in the last 72 hours D-Dimer: No results found for this basename: DDIMER:2 in the last 72 hours CBG: No results found for this basename: GLUCAP:6 in the  last 72 hours Hemoglobin A1C: No results found for this basename: HGBA1C in the last 72 hours Fasting Lipid Panel:  Basename 09/02/12 0521  CHOL 185  HDL 25*  LDLCALC 94  TRIG 147*  CHOLHDL 7.4  LDLDIRECT --   Thyroid Function Tests:  Basename 08/31/12 1446  TSH 4.782*  T4TOTAL --  FREET4 --  T3FREE --  THYROIDAB --   Anemia Panel: No results found for this basename: VITAMINB12,FOLATE,FERRITIN,TIBC,IRON,RETICCTPCT in the last 72 hours Coagulation: No results found for this basename: LABPROT:2,INR:2 in the last 72 hours Urine Drug Screen: Drugs of Abuse  No results found for this basename: labopia, cocainscrnur, labbenz, amphetmu, thcu, labbarb    Alcohol Level: No results found for this basename: ETH:2 in the last 72 hours Urinalysis: No results found for this basename: COLORURINE:2,APPERANCEUR:2,LABSPEC:2,PHURINE:2,GLUCOSEU:2,HGBUR:2,BILIRUBINUR:2,KETONESUR:2,PROTEINUR:2,UROBILINOGEN:2,NITRITE:2,LEUKOCYTESUR:2 in the last 72 hours Misc. Labs:  ABGS No results found for this basename: PHART,PCO2,PO2ART,TCO2,HCO3 in the last 72 hours CULTURES No results found for this or any previous visit (from the past 240 hour(s)). Studies/Results: Chest Portable 1 View  08/31/2012  *RADIOLOGY REPORT*  Clinical Data: Hypertension.  PORTABLE CHEST - 1 VIEW  Comparison: 08/17/2012  Findings: Heart is borderline in size.  Lungs are clear.  Mild hyperinflation of the lungs.  No effusions or acute bony abnormality.  IMPRESSION: No active cardiopulmonary disease.   Original Report Authenticated By: Cyndie Chime, M.D.     Medications:  Prior to Admission:  Prescriptions prior to admission  Medication Sig Dispense Refill  . acetaminophen (TYLENOL) 500 MG tablet Take 500 mg by mouth every 4 (four) hours as needed. For pain      . albuterol (PROVENTIL HFA;VENTOLIN HFA) 108 (90 BASE) MCG/ACT inhaler Inhale 2 puffs into the lungs every 4 (four) hours as needed. For wheezing      .  ALPRAZolam (XANAX) 1 MG tablet Take 1 tablet (1 mg total) by mouth 3 (three) times daily.  15 tablet  0  . Alum & Mag Hydroxide-Simeth (ANTACID LIQUID PO) Take 30 mLs by mouth every 2 (two) hours as needed. Heartburn      . feeding supplement (ENSURE COMPLETE) LIQD Take 237 mLs by mouth 2 (two) times daily between meals.      Marland Kitchen HYDROcodone-acetaminophen (NORCO) 10-325 MG per tablet Take 1 tablet by mouth every 6 (six) hours as needed for pain.  20 tablet  0  . omeprazole (PRILOSEC) 20 MG capsule Take 20 mg by mouth daily.        . simethicone (MYLICON) 80 MG chewable tablet Chew 80 mg by mouth 3 (three) times daily.        Scheduled:   . sodium chloride   Intravenous STAT  . ALPRAZolam  1 mg Oral TID  . diltiazem  240 mg Oral Daily  . enoxaparin (LOVENOX) injection  40 mg Subcutaneous Q24H  . feeding supplement  237 mL Oral BID BM  . pantoprazole  40 mg Oral Q1200  . potassium chloride  20 mEq Oral BID  . sodium chloride  3 mL Intravenous Q12H  . DISCONTD: diltiazem  60 mg Oral Q6H  . DISCONTD: enoxaparin (LOVENOX) injection  30 mg Subcutaneous Q24H   Continuous:   . DISCONTD: sodium chloride 1,000 mL (09/01/12 0835)   ZOX:[id number], albuterol, alum & mag hydroxide-simeth, HYDROcodone-acetaminophen, ondansetron (ZOFRAN) IV, ondansetron, traZODone  Assesment: She has supraventricular tachycardia. She is better. She has responded to diltiazem thus far. Active Problems:  Hypertension  COPD (chronic obstructive pulmonary disease)  Atrial arrhythmia  Arteriosclerotic cardiovascular disease (ASCVD)  Anemia  Peripheral vascular disease    Plan: I am going to send her back to her skilled care facility and she can have an outpatient consultation with her regular cardiologist    LOS: 2 days   Blaize Nipper L 09/02/2012, 9:05 AM

## 2012-09-02 NOTE — Progress Notes (Signed)
Pt transferred back to the [facility]. Report and Instructions were given to the pt's nurse at the [facility]. Orson Ape D 09/02/2012

## 2012-09-02 NOTE — Discharge Summary (Signed)
Physician Discharge Summary  Patient ID: [name] [name] MRN: [id number] DOB/AGE: 1935-03-09 77 y.o. Primary Care Physician:Soren Pigman L, MD Admit date: 08/31/2012 Discharge date: 09/02/2012    Discharge Diagnoses:   Active Problems:  ARTHRITIS, HIPS, BILATERAL  Hypertension  COPD (chronic obstructive pulmonary disease)  Atrial arrhythmia  AAA (abdominal aortic aneurysm)  Arteriosclerotic cardiovascular disease (ASCVD)  Anemia  Peripheral vascular disease  Small bowel obstruction     Medication List     As of 09/02/2012  9:08 AM    ASK your doctor about these medications         acetaminophen 500 MG tablet   Commonly known as: TYLENOL   Take 500 mg by mouth every 4 (four) hours as needed. For pain      albuterol 108 (90 BASE) MCG/ACT inhaler   Commonly known as: PROVENTIL HFA;VENTOLIN HFA   Inhale 2 puffs into the lungs every 4 (four) hours as needed. For wheezing      ALPRAZolam 1 MG tablet   Commonly known as: XANAX   Take 1 tablet (1 mg total) by mouth 3 (three) times daily.      ANTACID LIQUID PO   Take 30 mLs by mouth every 2 (two) hours as needed. Heartburn      feeding supplement Liqd   Take 237 mLs by mouth 2 (two) times daily between meals.      HYDROcodone-acetaminophen 10-325 MG per tablet   Commonly known as: NORCO   Take 1 tablet by mouth every 6 (six) hours as needed for pain.      omeprazole 20 MG capsule   Commonly known as: PRILOSEC   Take 20 mg by mouth daily.      simethicone 80 MG chewable tablet   Commonly known as: MYLICON   Chew 80 mg by mouth 3 (three) times daily.        Discharged Condition: Improved   Consults:cardiology  Significant Diagnostic Studies: Ct Abdomen Pelvis Wo Contrast  08/04/2012  *RADIOLOGY REPORT*  Clinical Data: Follow-up abdominal aortic aneurysm.  CT ABDOMEN AND PELVIS WITHOUT CONTRAST  Technique:  Multidetector CT imaging of the abdomen and pelvis was performed following the standard protocol  without intravenous contrast.  Comparison: 12/04/2011  Findings: Infrarenal abdominal aortic aneurysm measures 5.4 x 5.4 cm, without significant change in size since previous study.  No evidence of aneurysm leak or rupture.  No evidence of iliac artery aneurysm.  Tiny nonobstructing left intrarenal calculus again noted measuring less than 5 mm.  Bilateral renal parenchymal atrophy again demonstrated, however there is no evidence of renal mass or hydronephrosis.  The other abdominal parenchymal organs have a normal appearance on this noncontrast study. Small calcified gallstones seen in the gallbladder fundus, however there is no evidence of cholecystitis.  Uterus and adnexa are unremarkable in appearance.  No soft tissue masses or lymphadenopathy identified.  No evidence of inflammatory process or abnormal fluid collections.  Severe bilateral hip DJD also noted with chronic collapse of the articular surface of both femoral heads.  IMPRESSION:  1.  Stable 5.4 cm infrarenal abdominal aortic aneurysm.  No evidence of aneurysm leak or rupture. 2.  Nonobstructing left nephrolithiasis and cholelithiasis.  Original Report Authenticated By: Danae Orleans, M.D.   Dg Abd 1 View  08/14/2012  *RADIOLOGY REPORT*  Clinical Data: Abdominal distention.  ABDOMEN - 1 VIEW  Comparison: 08/11/2012  Findings: Supine images of the abdomen demonstrate gaseous distention of the stomach.  There are still gas-filled loops of small bowel.  Overall, there is decreased small bowel distention. Again noted is sclerosis and abnormality of the femoral heads. There is high-density material contrast within the rectum.  The nasogastric tube has been removed.  Large gas-filled structure in the right lower quadrant is nonspecific and could be associated with the cecum. Limited evaluation for free air on the supine images.  IMPRESSION: Decreased small bowel gaseous distention.  Slightly increased gaseous distention of the stomach since removal of the  nasogastric tube.   Original Report Authenticated By: Richarda Overlie, M.D.    Ct Head Wo Contrast  08/15/2012  *RADIOLOGY REPORT*  Clinical Data: Acute encephalopathy  CT HEAD WITHOUT CONTRAST  Technique:  Contiguous axial images were obtained from the base of the skull through the vertex without contrast.  Comparison: 09/11/2008  Findings: Motion degraded images.  No evidence of parenchymal hemorrhage or extra-axial fluid collection. No mass lesion, mass effect, or midline shift.  No CT evidence of acute infarction.  Subcortical white matter and periventricular small vessel ischemic changes.  Intracranial atherosclerosis.  Global cortical atrophy.  No ventriculomegaly.  The visualized paranasal sinuses are essentially clear. The mastoid air cells are unopacified.  No evidence of calvarial fracture.  IMPRESSION: No evidence of acute intracranial abnormality.  Atrophy with small vessel ischemic changes and intracranial atherosclerosis.   Original Report Authenticated By: Charline Bills, M.D.    Ct Angio Chest Pe W/cm &/or Wo Cm  08/17/2012  *RADIOLOGY REPORT*  Clinical Data: Possible right ventricular outflow tract thrombus noted on recent two-dimensional echocardiogram.  Evaluate for potential RVOT thrombus or pulmonary embolism.  CT ANGIOGRAPHY CHEST  Technique:  Multidetector CT imaging of the chest using the standard protocol during bolus administration of intravenous contrast. Multiplanar reconstructed images including MIPs were obtained and reviewed to evaluate the vascular anatomy.  Contrast: OMNIPAQUE IOHEXOL 350 MG/ML SOLN  Comparison: Chest c t 01/18/2007.  Findings:  Mediastinum: Study is severely limited by patient respiratory motion.  Because of this, accurate assessment for distal segmental and subsegmental sized emboli is not possible on this examination. Additionally, in the right lower lobe, even proximal segmental sized emboli cannot be excluded.  With these limitations in mind, there is no  evidence of central, lobe are or proximal segmental sized embolism.  Additionally, there is no filling defects identified within the right ventricle, or the right ventricular outflow tract to suggest thrombus. Heart size is normal. There is no significant pericardial fluid, thickening or pericardial calcification. There is atherosclerosis of the thoracic aorta, the great vessels of the mediastinum and the coronary arteries, including calcified atherosclerotic plaque in the left main, left anterior descending, left circumflex and right coronary arteries. Extensive atheromatous plaque is identified throughout the descending thoracic aorta, with multiple extensions impinging upon the lumen of the aorta (this may be increased risk for embolic event associated with any future catheterization attempts). A nasogastric tube extends to the gastroesophageal junction. Esophagus is unremarkable in appearance. No pathologically enlarged mediastinal or hilar lymph nodes. Right upper extremity PICC is in place with the tip coiled in the right subclavian vein adjacent to the junction with the right internal jugular vein.  Lungs/Pleura: No consolidative airspace disease.  No pleural effusions.  Extensive respiratory motion limits sensitivity for detection of small pulmonary nodules.  No large suspicious appearing pulmonary nodules or masses are confidently identified.  Upper Abdomen: Unremarkable.  Musculoskeletal: There are no aggressive appearing lytic or blastic lesions noted in the visualized portions of the skeleton.  IMPRESSION: 1.  Exceedingly limited examination  secondary to respiratory motion with no obvious pulmonary embolism, as above. 2.  No filling defect in the right ventricular outflow tract to suggest RVOT thrombus. 3.  The patient's right upper extremity PICC is coiled upon itself with the tip in the subclavian vein adjacent to the junction with the right internal jugular vein. 4. Severe atherosclerosis, including  left main and three-vessel coronary artery disease.  Assessment for potential risk factor modification, dietary therapy or pharmacologic therapy may be warranted, if clinically indicated. 5.  Other incidental findings, as above.   Original Report Authenticated By: Florencia Reasons, M.D.    Chest Portable 1 View  08/31/2012  *RADIOLOGY REPORT*  Clinical Data: Hypertension.  PORTABLE CHEST - 1 VIEW  Comparison: 08/17/2012  Findings: Heart is borderline in size.  Lungs are clear.  Mild hyperinflation of the lungs.  No effusions or acute bony abnormality.  IMPRESSION: No active cardiopulmonary disease.   Original Report Authenticated By: Cyndie Chime, M.D.    Dg Chest Port 1 View  08/17/2012  *RADIOLOGY REPORT*  Clinical Data: PICC repositioning.  PORTABLE CHEST - 1 VIEW  Comparison: CT scan dated 08/17/2012 and chest x-ray dated 05/23/2012  Findings: PICC line is looped in the right subclavian vein and the tip is in the right subclavian vein.  NG tube tip is below the diaphragm.  Proximal side hole is in the distal esophagus.  Heart size and vascularity are normal and the lungs are clear. Slight thoracic scoliosis.  IMPRESSION: PICC line and and the its tip are looped in the right subclavian vein.  Clear lungs.   Original Report Authenticated By: Gwynn Burly, M.D.    Dg Abd 2 Views  08/11/2012  *RADIOLOGY REPORT*  Clinical Data: Small bowel obstruction  ABDOMEN - 2 VIEW  Comparison: 08/10/2012  Findings: Persistent distended small bowel loops with air-fluid levels consistent with small bowel obstruction.  No free abdominal air.  IMPRESSION:  Again noted distended small bowel loops with air-fluid levels consistent with small bowel obstruction.   Original Report Authenticated By: Natasha Mead, M.D.    Dg Abd 2 Views  08/10/2012  *RADIOLOGY REPORT*  Clinical Data: Abdominal distention, nausea.  ABDOMEN - 2 VIEW  Comparison:   the previous day's study  Findings: No free air on the left lateral decubitus  radiograph. Multiple dilated small bowel loops throughout the abdomen with fluid levels on the decubitus film.  Residual contrast in the decompressed colon. Nasogastric tube extends into the nondilated stomach.  IMPRESSION:  1.  Persistent   small bowel obstruction.  No free air.   Original Report Authenticated By: Osa Craver, M.D.    Dg Abd Portable 1v  08/09/2012  *RADIOLOGY REPORT*  Clinical Data: Abdominal distention  PORTABLE ABDOMEN - 1 VIEW  Comparison: 08/18  Findings: There is persistent small bowel dilatation, similar to the previous exam, consistent with small bowel obstruction.  Small amount of gas present in the colon.  Nasogastric tube has its tip in the stomach.  IMPRESSION: Persistent small bowel obstruction pattern.   Original Report Authenticated By: Thomasenia Sales, M.D. ( 08/09/2012 07:48:04 )    Dg Abd Portable 2v  08/16/2012  *RADIOLOGY REPORT*  Clinical Data: Abdominal pain.  Follow up ileus.  PORTABLE ABDOMEN - 2 VIEW  Comparison: 08/15/2012.  Findings: Nasogastric tube tip is at the gastroesophageal junction. Stomach is decompressed when compared with yesterday's exam.  There is persistent dilatation of bowel in the low central abdomen.  No free air.  Severe degenerative changes are seen in the hips with flattening of the left humeral head.  IMPRESSION: Improving gaseous distention of bowel.   Original Report Authenticated By: Reyes Ivan, M.D.    Dg Abd Portable 2v  08/15/2012  *RADIOLOGY REPORT*  Clinical Data: Evaluate small bowel obstruction or ileus.  PORTABLE ABDOMEN - 2 VIEW  Comparison: 08/14/2012  Findings: Supine and left lower decubitus images were obtained. There is no evidence to suggest free air.  There is gas within the right colon.  There is persistent gaseous distention of the stomach.  There are still dilated gas-filled loops of small bowel. The contrast in the rectum has apparently passed and there is now gas in the rectum.  Chronic changes  involving both hips.  IMPRESSION: There is persistent gaseous distention of the stomach and bowel loops.  There appears to be increased gas within the colon. Findings are more suggestive for an ileus pattern rather than an obstruction at this time.   Original Report Authenticated By: Richarda Overlie, M.D.    Dg Abd Portable 2v  08/08/2012  *RADIOLOGY REPORT*  Clinical Data: Evaluate small bowel obstruction.  Question worsening.  PORTABLE ABDOMEN - 2 VIEW  Comparison: 08/07/2012  Findings: Nasogastric tube tip overlies the level of the stomach. There is persistent significant dilatation of small bowel loops. No free intraperitoneal air on left lateral decubitus view.  There is persistent contrast, likely within the colon, showing little interval progression since previous exam.  IMPRESSION: Persistent high-grade small bowel obstruction.  Original Report Authenticated By: Patterson Hammersmith, M.D.   Dg Abd Portable 2v  08/07/2012  *RADIOLOGY REPORT*  Clinical Data: Abdominal distention and decreased bowel sounds.  PORTABLE ABDOMEN - 2 VIEW  Comparison: CT abdomen and pelvis 08/04/2012.  Findings: Multiple dilated loops of small bowel measuring up to 5.8 cm are identified.  No free intraperitoneal air is identified.  IMPRESSION: Bowel gas pattern compatible with small bowel obstruction.  Original Report Authenticated By: Bernadene Bell. Maricela Curet, M.D.    Lab Results: Basic Metabolic Panel:  Basename 09/01/12 0518 08/31/12 1446  NA 144 141  K 3.7 3.6  CL 113* 107  CO2 24 23  GLUCOSE 90 112*  BUN 10 11  CREATININE 0.90 1.01  CALCIUM 8.4 9.0  MG -- --  PHOS -- --   Liver Function Tests: No results found for this basename: AST:2,ALT:2,ALKPHOS:2,BILITOT:2,PROT:2,ALBUMIN:2 in the last 72 hours   CBC:  Basename 08/31/12 1446  WBC 8.4  NEUTROABS --  HGB 10.6*  HCT 34.4*  MCV 96.9  PLT 364    No results found for this or any previous visit (from the past 240 hour(s)).   Hospital Course: She was  admitted with supraventricular tachycardia. She received adenosine x2 in the emergency room. She had previously been on sotalol but has been off that for some time. She is in a skilled care facility recovering from acute renal failure and small bowel obstruction. She was found to have a heart rate of about 150. Since she has been on diltiazem her heart rate has remained stable and she being transferred back to the skilled care facility and will see her regular cardiologist as an outpatient Discharge Exam: Blood pressure 131/76, pulse 96, temperature 98.1 F (36.7 C), temperature source Oral, resp. rate 16, height 5\' 5"  (1.651 m), weight 86.1 kg (189 lb 13.1 oz), SpO2 94.00%. *She is awake and alert. Her pupils are reactive her chest is clear. Her heart is regular with a rate of  70 Disposition: To skilled care facility. She will be on a heart healthy diet She will be on the following medications:  Tylenol 500 mg every 4 hours as needed for fever or mild pain Norco 10/325 every 6 hours when necessary moderate pain Trazodone 25 mg at bedtime as needed for sleep Xanax 1 mg by mouth 3 times a day Diltiazem CD 240 mg daily Ensure twice a day between meals Prilosec 20 mg daily spiriva 1 inhalation daily Albuterol 2.5 mg by nebulizer 4 times a day as needed for shortness of breath  She will have physical therapy occupational therapy and speech therapy as needed  She needs an appointment with Dr. Daphene Jaeger at [facility]     Discharge Orders    Future Appointments: Provider: Department: Dept Phone: Center:   02/09/2013 9:00 AM [facility] [phone] [facility]   02/09/2013 9:45 AM Chuck Hint, MD [facility] [phone] [facility]      Follow-up Information    Follow up with [facility] EMERGENCY DEPARTMENT. (As needed if symptoms worsen)    Contact information:   [address] [place] [address] [phone]          Signed: Fredirick Maudlin Pager [phone]  09/02/2012, 9:08 AM

## 2012-09-03 DIAGNOSIS — F411 Generalized anxiety disorder: Secondary | ICD-10-CM | POA: Diagnosis not present

## 2012-09-03 DIAGNOSIS — G4700 Insomnia, unspecified: Secondary | ICD-10-CM | POA: Diagnosis not present

## 2012-09-03 DIAGNOSIS — F0391 Unspecified dementia with behavioral disturbance: Secondary | ICD-10-CM | POA: Diagnosis not present

## 2012-09-03 DIAGNOSIS — F39 Unspecified mood [affective] disorder: Secondary | ICD-10-CM | POA: Diagnosis not present

## 2012-09-16 DIAGNOSIS — F39 Unspecified mood [affective] disorder: Secondary | ICD-10-CM | POA: Diagnosis not present

## 2012-09-16 DIAGNOSIS — G4700 Insomnia, unspecified: Secondary | ICD-10-CM | POA: Diagnosis not present

## 2012-09-16 DIAGNOSIS — F411 Generalized anxiety disorder: Secondary | ICD-10-CM | POA: Diagnosis not present

## 2012-09-16 DIAGNOSIS — F0391 Unspecified dementia with behavioral disturbance: Secondary | ICD-10-CM | POA: Diagnosis not present

## 2012-09-30 ENCOUNTER — Other Ambulatory Visit: Payer: Self-pay | Admitting: *Deleted

## 2012-09-30 DIAGNOSIS — I714 Abdominal aortic aneurysm, without rupture: Secondary | ICD-10-CM

## 2012-10-26 DIAGNOSIS — R609 Edema, unspecified: Secondary | ICD-10-CM | POA: Diagnosis not present

## 2012-10-26 DIAGNOSIS — I4510 Unspecified right bundle-branch block: Secondary | ICD-10-CM | POA: Diagnosis not present

## 2012-10-26 DIAGNOSIS — E785 Hyperlipidemia, unspecified: Secondary | ICD-10-CM | POA: Diagnosis not present

## 2012-10-26 DIAGNOSIS — I2510 Atherosclerotic heart disease of native coronary artery without angina pectoris: Secondary | ICD-10-CM | POA: Diagnosis not present

## 2012-11-21 DIAGNOSIS — I2510 Atherosclerotic heart disease of native coronary artery without angina pectoris: Secondary | ICD-10-CM | POA: Diagnosis not present

## 2012-11-21 DIAGNOSIS — R262 Difficulty in walking, not elsewhere classified: Secondary | ICD-10-CM | POA: Diagnosis not present

## 2012-11-21 DIAGNOSIS — M1610 Unilateral primary osteoarthritis, unspecified hip: Secondary | ICD-10-CM | POA: Diagnosis not present

## 2012-11-21 DIAGNOSIS — Z5189 Encounter for other specified aftercare: Secondary | ICD-10-CM | POA: Diagnosis not present

## 2012-11-21 DIAGNOSIS — I10 Essential (primary) hypertension: Secondary | ICD-10-CM | POA: Diagnosis not present

## 2012-11-21 DIAGNOSIS — J449 Chronic obstructive pulmonary disease, unspecified: Secondary | ICD-10-CM | POA: Diagnosis not present

## 2012-11-29 DIAGNOSIS — E785 Hyperlipidemia, unspecified: Secondary | ICD-10-CM | POA: Diagnosis not present

## 2012-11-29 DIAGNOSIS — J449 Chronic obstructive pulmonary disease, unspecified: Secondary | ICD-10-CM | POA: Diagnosis not present

## 2012-11-29 DIAGNOSIS — F411 Generalized anxiety disorder: Secondary | ICD-10-CM | POA: Diagnosis not present

## 2012-11-29 DIAGNOSIS — I10 Essential (primary) hypertension: Secondary | ICD-10-CM | POA: Diagnosis not present

## 2012-12-27 ENCOUNTER — Observation Stay (HOSPITAL_COMMUNITY)
Admission: EM | Admit: 2012-12-27 | Discharge: 2012-12-30 | Disposition: A | Discharge status: Home Health | Payer: Medicare Other | Attending: Pulmonary Disease | Admitting: Pulmonary Disease

## 2012-12-27 ENCOUNTER — Emergency Department (HOSPITAL_COMMUNITY): Payer: Medicare Other

## 2012-12-27 ENCOUNTER — Encounter (HOSPITAL_COMMUNITY): Payer: Self-pay | Admitting: Emergency Medicine

## 2012-12-27 DIAGNOSIS — R627 Adult failure to thrive: Secondary | ICD-10-CM | POA: Diagnosis not present

## 2012-12-27 DIAGNOSIS — M87059 Idiopathic aseptic necrosis of unspecified femur: Secondary | ICD-10-CM | POA: Insufficient documentation

## 2012-12-27 DIAGNOSIS — N289 Disorder of kidney and ureter, unspecified: Secondary | ICD-10-CM | POA: Diagnosis not present

## 2012-12-27 DIAGNOSIS — E860 Dehydration: Secondary | ICD-10-CM | POA: Insufficient documentation

## 2012-12-27 DIAGNOSIS — I739 Peripheral vascular disease, unspecified: Secondary | ICD-10-CM | POA: Insufficient documentation

## 2012-12-27 DIAGNOSIS — J449 Chronic obstructive pulmonary disease, unspecified: Secondary | ICD-10-CM

## 2012-12-27 DIAGNOSIS — R5383 Other fatigue: Secondary | ICD-10-CM | POA: Diagnosis not present

## 2012-12-27 DIAGNOSIS — M1610 Unilateral primary osteoarthritis, unspecified hip: Secondary | ICD-10-CM

## 2012-12-27 DIAGNOSIS — E876 Hypokalemia: Secondary | ICD-10-CM | POA: Diagnosis present

## 2012-12-27 DIAGNOSIS — Z01812 Encounter for preprocedural laboratory examination: Secondary | ICD-10-CM | POA: Diagnosis not present

## 2012-12-27 DIAGNOSIS — R1011 Right upper quadrant pain: Principal | ICD-10-CM | POA: Insufficient documentation

## 2012-12-27 DIAGNOSIS — I2510 Atherosclerotic heart disease of native coronary artery without angina pectoris: Secondary | ICD-10-CM | POA: Diagnosis present

## 2012-12-27 DIAGNOSIS — Z01818 Encounter for other preprocedural examination: Secondary | ICD-10-CM | POA: Insufficient documentation

## 2012-12-27 DIAGNOSIS — I714 Abdominal aortic aneurysm, without rupture, unspecified: Secondary | ICD-10-CM | POA: Insufficient documentation

## 2012-12-27 DIAGNOSIS — R5381 Other malaise: Secondary | ICD-10-CM | POA: Diagnosis not present

## 2012-12-27 DIAGNOSIS — L98499 Non-pressure chronic ulcer of skin of other sites with unspecified severity: Secondary | ICD-10-CM | POA: Diagnosis not present

## 2012-12-27 DIAGNOSIS — Z01810 Encounter for preprocedural cardiovascular examination: Secondary | ICD-10-CM | POA: Insufficient documentation

## 2012-12-27 DIAGNOSIS — J4489 Other specified chronic obstructive pulmonary disease: Secondary | ICD-10-CM | POA: Insufficient documentation

## 2012-12-27 DIAGNOSIS — I10 Essential (primary) hypertension: Secondary | ICD-10-CM | POA: Diagnosis not present

## 2012-12-27 DIAGNOSIS — R4182 Altered mental status, unspecified: Secondary | ICD-10-CM | POA: Diagnosis not present

## 2012-12-27 HISTORY — DX: Anemia, unspecified: D64.9

## 2012-12-27 HISTORY — DX: Atherosclerotic heart disease of native coronary artery without angina pectoris: I25.10

## 2012-12-27 LAB — URINALYSIS, ROUTINE W REFLEX MICROSCOPIC
Glucose, UA: NEGATIVE mg/dL
Hgb urine dipstick: NEGATIVE
Ketones, ur: NEGATIVE mg/dL
Protein, ur: NEGATIVE mg/dL
Urobilinogen, UA: 2 mg/dL — ABNORMAL HIGH (ref 0.0–1.0)

## 2012-12-27 LAB — CBC WITH DIFFERENTIAL/PLATELET
Basophils Absolute: 0 10*3/uL (ref 0.0–0.1)
Eosinophils Relative: 0 % (ref 0–5)
HCT: 40.9 % (ref 36.0–46.0)
Hemoglobin: 13.2 g/dL (ref 12.0–15.0)
Lymphocytes Relative: 28 % (ref 12–46)
Lymphs Abs: 3.1 10*3/uL (ref 0.7–4.0)
MCV: 84.5 fL (ref 78.0–100.0)
Monocytes Absolute: 1.4 10*3/uL — ABNORMAL HIGH (ref 0.1–1.0)
Neutro Abs: 6.6 10*3/uL (ref 1.7–7.7)
RBC: 4.84 MIL/uL (ref 3.87–5.11)
RDW: 16.9 % — ABNORMAL HIGH (ref 11.5–15.5)
WBC: 11.2 10*3/uL — ABNORMAL HIGH (ref 4.0–10.5)

## 2012-12-27 LAB — COMPREHENSIVE METABOLIC PANEL
ALT: 13 U/L (ref 0–35)
AST: 14 U/L (ref 0–37)
CO2: 28 mEq/L (ref 19–32)
Chloride: 101 mEq/L (ref 96–112)
Creatinine, Ser: 1.43 mg/dL — ABNORMAL HIGH (ref 0.50–1.10)
GFR calc Af Amer: 40 mL/min — ABNORMAL LOW (ref >90)
GFR calc non Af Amer: 35 mL/min — ABNORMAL LOW (ref >90)
Glucose, Bld: 127 mg/dL — ABNORMAL HIGH (ref 70–99)
Total Bilirubin: 0.5 mg/dL (ref 0.3–1.2)

## 2012-12-27 MED ORDER — SODIUM CHLORIDE 0.9 % IV SOLN
INTRAVENOUS | Status: DC
  Administered 2012-12-27: via INTRAVENOUS

## 2012-12-27 NOTE — ED Notes (Signed)
[facility] from [facility]. Caregiver brought paperwork and reports that patient has been off balance when attempting to ambulate, has been urinating frequently with odor to urine at times, and has been disoriented.

## 2012-12-27 NOTE — ED Notes (Signed)
Patient transported to X-ray via stretcher 

## 2012-12-27 NOTE — ED Provider Notes (Signed)
History    Scribed for [employer]. Mica Ramdass, MD, the patient was seen in room APA18/[facility]18 . This chart was scribed by Lewanda Rife.  CSN: [id number]  Arrival date & time 12/27/12  2030   First MD Initiated Contact with Patient 12/27/12 2058      Chief Complaint  Patient presents with  . Altered Mental Status  . Urinary Frequency    (Consider location/radiation/quality/duration/timing/severity/associated sxs/prior treatment) HPI [name] [name] is a 78 y.o. female who presents to the Emergency Department complaining of dysuria for the past 3 days. Pt describes the dysuria as a burning sensation. Pt reports loss of appetite, frequency with urination, vaginal itching, cough, and dry heaving. Pt denies chest pain, nausea, emesis and diarrhea.  Pt reports symptoms are relieved by nothing.  Past Medical History  Diagnosis Date  . Hypertension   . Hyperlipidemia   . COPD (chronic obstructive pulmonary disease)     multiple hospitalizations for exacerbations with acute bronchitis  . Degenerative joint disease     Bilateral hips  . Depression with anxiety   . GERD (gastroesophageal reflux disease)     Hiatal hernia  . Atrial arrhythmia     Atrial fibrillation and atrial flutter diagnosed in the past;?  PSVT  . AAA (abdominal aortic aneurysm)     infrarenal; 07/2012: Diameter of 5.4 cm  . Arteriosclerotic cardiovascular disease (ASCVD)     Cardiac cath in 03/2000->  80% LAD, 70-80% RCA;  stent x1 in the LAD and x2 in the RCA with excellent angiographic outcome; repeat catheterization in late 2001 and 2003 revealed no restenosis; EF-50%  . Tobacco abuse, in remission     Quit in 2006; total consumption of 50-75 pack years  . Pituitary microadenoma     transsphenoidal resection in 09/2007  . Nephrolithiasis   . Anemia   . Coronary artery disease     Past Surgical History  Procedure Date  . Kidney stone surgery   . Transphenoidal / transnasal hypophysectomy / resection  pituitary tumor 09/2007    Family History  Problem Relation Age of Onset  . Heart disease Mother   . Hyperlipidemia Mother   . Hypertension Mother   . Stroke Mother   . Cancer Brother     History  Substance Use Topics  . Smoking status: Former Smoker -- 1.5 packs/day for 50 years    Types: Cigarettes    Quit date: 05/27/2005  . Smokeless tobacco: Former Neurosurgeon    Quit date: 01/11/2008  . Alcohol Use: No    OB History    Grav Para Term Preterm Abortions TAB SAB Ect Mult Living                  Review of Systems  Genitourinary: Positive for dysuria and frequency.  All other systems reviewed and are negative.    Allergies  Asa; Lidocaine; Nsaids; and Sulfonamide derivatives  Home Medications   Current Outpatient Rx  Name  Route  Sig  Dispense  Refill  . ACETAMINOPHEN 500 MG PO TABS   Oral   Take 500 mg by mouth every 4 (four) hours as needed. For pain         . ALBUTEROL SULFATE (2.5 MG/3ML) 0.083% IN NEBU   Nebulization   Take 2.5 mg by nebulization every 6 (six) hours as needed. For shortness of breath         . ALPRAZOLAM 0.5 MG PO TABS   Oral   Take 0.5 mg by mouth  every 8 (eight) hours as needed.         . FENOFIBRIC ACID 135 MG PO CPDR   Oral   Take 1 tablet by mouth daily.         Marland Kitchen DILTIAZEM HCL ER BEADS 240 MG PO CP24   Oral   Take 240 mg by mouth daily.         Marland Kitchen DIVALPROEX SODIUM 125 MG PO CPSP   Oral   Take 125-250 mg by mouth 2 (two) times daily. Take two capsules in the morning and take one capsule at bedtime         . FUROSEMIDE 40 MG PO TABS   Oral   Take 40 mg by mouth daily.         Marland Kitchen HYDROCODONE-ACETAMINOPHEN 10-325 MG PO TABS   Oral   Take 1 tablet by mouth 3 (three) times daily as needed.         . NEBIVOLOL HCL 5 MG PO TABS   Oral   Take 5 mg by mouth daily.         Marland Kitchen OMEPRAZOLE 20 MG PO CPDR   Oral   Take 20 mg by mouth daily.         Marland Kitchen ROSUVASTATIN CALCIUM 10 MG PO TABS   Oral   Take 10 mg by  mouth at bedtime.         Marland Kitchen TIOTROPIUM BROMIDE MONOHYDRATE 18 MCG IN CAPS   Inhalation   Place 18 mcg into inhaler and inhale daily.         . TRAMADOL HCL 50 MG PO TABS   Oral   Take 50 mg by mouth every 6 (six) hours as needed.         . TRAZODONE HCL 50 MG PO TABS   Oral   Take 25 mg by mouth at bedtime as needed. For sleep           BP 124/76  Pulse 61  Temp 97.3 F (36.3 C) (Oral)  Resp 20  Ht 5\' 5"  (1.651 m)  Wt 160 lb (72.576 kg)  BMI 26.63 kg/m2  SpO2 97%  Physical Exam  Nursing note and vitals reviewed. Constitutional: She is oriented to person, place, and time. She appears well-developed and well-nourished.  HENT:  Head: Normocephalic and atraumatic.  Eyes: Conjunctivae normal are normal.  Neck: Normal range of motion.  Cardiovascular: Normal rate and regular rhythm.   Abdominal: Soft. She exhibits no distension and no mass. There is no tenderness. There is no rebound, no guarding and no CVA tenderness.  Musculoskeletal: Normal range of motion.  Neurological: She is alert and oriented to person, place, and time.  Skin: Skin is warm and dry.  Psychiatric: She has a normal mood and affect. Her behavior is normal.    ED Course  Procedures (including critical care time)  Labs Reviewed  CBC WITH DIFFERENTIAL - Abnormal; Notable for the following:    WBC 11.2 (*)     RDW 16.9 (*)     Monocytes Absolute 1.4 (*)     All other components within normal limits  COMPREHENSIVE METABOLIC PANEL - Abnormal; Notable for the following:    Potassium 3.3 (*)     Glucose, Bld 127 (*)     Creatinine, Ser 1.43 (*)     Albumin 2.9 (*)     GFR calc non Af Amer 35 (*)     GFR calc Af Amer 40 (*)  All other components within normal limits  VALPROIC ACID LEVEL - Abnormal; Notable for the following:    Valproic Acid Lvl 31.5 (*)     All other components within normal limits  URINALYSIS, ROUTINE W REFLEX MICROSCOPIC - Abnormal; Notable for the following:     Urobilinogen, UA 2.0 (*)     All other components within normal limits  TROPONIN I   Dg Chest 2 View  12/27/2012  *RADIOLOGY REPORT*  Clinical Data: Weakness.  Altered mental status.  CHEST - 2 VIEW  Comparison: Plain film chest 08/31/2012 and 05/23/2012.  CT chest 08/17/2012.  Findings: Lungs are clear.  Heart size upper normal.  No pneumothorax or pleural fluid.  IMPRESSION: No acute disease.   Original Report Authenticated By: Holley Dexter, M.D.      1. Fatigue   2. Renal insufficiency      Date: 12/27/2012  Rate: 50  Rhythm: sinus bradycardia  QRS Axis: normal  Intervals: normal  ST/T Wave abnormalities: normal  Conduction Disutrbances:right bundle branch block  Narrative Interpretation: decreased rate  Old EKG Reviewed: changes noted    MDM  Patient with generalized weakness. Also some dysuria and foul-smelling urine. Some disorientation. Lab work shows worsening renal function. Urine does not show infection. Patient be admitted for further monitoring.      I personally performed the services described in this documentation, which was scribed in my presence. The recorded information has been reviewed and is accurate.     Juliet Rude. Rubin Payor, MD 12/27/12 [phone]

## 2012-12-28 ENCOUNTER — Encounter (HOSPITAL_COMMUNITY): Payer: Self-pay | Admitting: Internal Medicine

## 2012-12-28 ENCOUNTER — Observation Stay (HOSPITAL_COMMUNITY): Payer: Medicare Other

## 2012-12-28 DIAGNOSIS — E860 Dehydration: Secondary | ICD-10-CM | POA: Diagnosis present

## 2012-12-28 DIAGNOSIS — R1011 Right upper quadrant pain: Secondary | ICD-10-CM | POA: Diagnosis present

## 2012-12-28 DIAGNOSIS — M79609 Pain in unspecified limb: Secondary | ICD-10-CM | POA: Diagnosis not present

## 2012-12-28 DIAGNOSIS — Z01810 Encounter for preprocedural cardiovascular examination: Secondary | ICD-10-CM | POA: Diagnosis not present

## 2012-12-28 DIAGNOSIS — E876 Hypokalemia: Secondary | ICD-10-CM | POA: Diagnosis present

## 2012-12-28 DIAGNOSIS — R627 Adult failure to thrive: Secondary | ICD-10-CM | POA: Diagnosis not present

## 2012-12-28 DIAGNOSIS — I714 Abdominal aortic aneurysm, without rupture: Secondary | ICD-10-CM

## 2012-12-28 DIAGNOSIS — I2510 Atherosclerotic heart disease of native coronary artery without angina pectoris: Secondary | ICD-10-CM | POA: Diagnosis not present

## 2012-12-28 DIAGNOSIS — K8020 Calculus of gallbladder without cholecystitis without obstruction: Secondary | ICD-10-CM | POA: Diagnosis not present

## 2012-12-28 DIAGNOSIS — N281 Cyst of kidney, acquired: Secondary | ICD-10-CM | POA: Diagnosis not present

## 2012-12-28 DIAGNOSIS — M7989 Other specified soft tissue disorders: Secondary | ICD-10-CM | POA: Diagnosis not present

## 2012-12-28 LAB — LIPASE, BLOOD: Lipase: 54 U/L (ref 11–59)

## 2012-12-28 LAB — CBC
HCT: 36.4 % (ref 36.0–46.0)
Hemoglobin: 11.4 g/dL — ABNORMAL LOW (ref 12.0–15.0)
MCH: 26.6 pg (ref 26.0–34.0)
MCHC: 31.3 g/dL (ref 30.0–36.0)
MCV: 85 fL (ref 78.0–100.0)
RBC: 4.28 MIL/uL (ref 3.87–5.11)

## 2012-12-28 LAB — COMPREHENSIVE METABOLIC PANEL
ALT: 11 U/L (ref 0–35)
AST: 11 U/L (ref 0–37)
Albumin: 2.4 g/dL — ABNORMAL LOW (ref 3.5–5.2)
CO2: 30 mEq/L (ref 19–32)
Calcium: 8.7 mg/dL (ref 8.4–10.5)
Creatinine, Ser: 1.31 mg/dL — ABNORMAL HIGH (ref 0.50–1.10)
GFR calc non Af Amer: 39 mL/min — ABNORMAL LOW (ref >90)
Sodium: 141 mEq/L (ref 135–145)
Total Protein: 6.6 g/dL (ref 6.0–8.3)

## 2012-12-28 LAB — MRSA PCR SCREENING: MRSA by PCR: POSITIVE — AB

## 2012-12-28 MED ORDER — DIVALPROEX SODIUM 125 MG PO CPSP: 125.0000 mg | ORAL_CAPSULE | Freq: Two times a day (BID) | ORAL | Status: DC

## 2012-12-28 MED ORDER — SODIUM CHLORIDE 0.9 % IJ SOLN
3.0000 mL | Freq: Two times a day (BID) | INTRAMUSCULAR | Status: DC
  Administered 2012-12-28 – 2012-12-30 (×5): 3 mL via INTRAVENOUS
  Filled 2012-12-28: qty 3

## 2012-12-28 MED ORDER — TIOTROPIUM BROMIDE MONOHYDRATE 18 MCG IN CAPS
18.0000 ug | ORAL_CAPSULE | Freq: Every day | RESPIRATORY_TRACT | Status: DC
  Administered 2012-12-28 – 2012-12-30 (×3): 18 ug via RESPIRATORY_TRACT
  Filled 2012-12-28: qty 5

## 2012-12-28 MED ORDER — PANTOPRAZOLE SODIUM 40 MG PO TBEC
40.0000 mg | DELAYED_RELEASE_TABLET | Freq: Every day | ORAL | Status: DC
Start: 2012-12-28 — End: 2012-12-30
  Administered 2012-12-28 – 2012-12-30 (×3): 40 mg via ORAL
  Filled 2012-12-28 (×3): qty 1

## 2012-12-28 MED ORDER — ALBUTEROL SULFATE (5 MG/ML) 0.5% IN NEBU: 2.5000 mg | INHALATION_SOLUTION | RESPIRATORY_TRACT | Status: DC | PRN

## 2012-12-28 MED ORDER — ALBUTEROL SULFATE (5 MG/ML) 0.5% IN NEBU
2.5000 mg | INHALATION_SOLUTION | Freq: Four times a day (QID) | RESPIRATORY_TRACT | Status: DC
  Administered 2012-12-28 – 2012-12-29 (×5): 2.5 mg via RESPIRATORY_TRACT
  Filled 2012-12-28 (×6): qty 0.5

## 2012-12-28 MED ORDER — ALPRAZOLAM 0.5 MG PO TABS
0.5000 mg | ORAL_TABLET | Freq: Three times a day (TID) | ORAL | Status: DC | PRN
  Administered 2012-12-28 – 2012-12-29 (×4): 0.5 mg via ORAL
  Filled 2012-12-28 (×5): qty 1

## 2012-12-28 MED ORDER — ONDANSETRON HCL 4 MG PO TABS: 4.0000 mg | ORAL_TABLET | Freq: Four times a day (QID) | ORAL | Status: DC | PRN

## 2012-12-28 MED ORDER — CHLORHEXIDINE GLUCONATE CLOTH 2 % EX PADS
6.0000 | MEDICATED_PAD | Freq: Every day | CUTANEOUS | Status: DC
  Administered 2012-12-29 – 2012-12-30 (×2): 6 via TOPICAL

## 2012-12-28 MED ORDER — ATORVASTATIN CALCIUM 20 MG PO TABS
20.0000 mg | ORAL_TABLET | Freq: Every day | ORAL | Status: DC
  Administered 2012-12-28 – 2012-12-30 (×3): 20 mg via ORAL
  Filled 2012-12-28 (×3): qty 1

## 2012-12-28 MED ORDER — DIVALPROEX SODIUM 125 MG PO CPSP
125.0000 mg | ORAL_CAPSULE | Freq: Every day | ORAL | Status: DC
  Administered 2012-12-28 – 2012-12-29 (×2): 125 mg via ORAL
  Filled 2012-12-28 (×3): qty 1

## 2012-12-28 MED ORDER — TRAZODONE HCL 50 MG PO TABS: 25.0000 mg | ORAL_TABLET | Freq: Every evening | ORAL | Status: DC | PRN

## 2012-12-28 MED ORDER — HEPARIN SODIUM (PORCINE) 5000 UNIT/ML IJ SOLN
5000.0000 [IU] | Freq: Three times a day (TID) | INTRAMUSCULAR | Status: DC
  Administered 2012-12-28 – 2012-12-30 (×7): 5000 [IU] via SUBCUTANEOUS
  Filled 2012-12-28 (×6): qty 1

## 2012-12-28 MED ORDER — POTASSIUM CHLORIDE IN NACL 20-0.9 MEQ/L-% IV SOLN
INTRAVENOUS | Status: AC
  Administered 2012-12-28: 03:00:00 via INTRAVENOUS

## 2012-12-28 MED ORDER — SODIUM CHLORIDE 0.9 % IV BOLUS (SEPSIS)
500.0000 mL | Freq: Once | INTRAVENOUS | Status: AC
  Administered 2012-12-28: 500 mL via INTRAVENOUS

## 2012-12-28 MED ORDER — ACETAMINOPHEN 650 MG RE SUPP: 650.0000 mg | Freq: Four times a day (QID) | RECTAL | Status: DC | PRN

## 2012-12-28 MED ORDER — MUPIROCIN 2 % EX OINT
1.0000 "application " | TOPICAL_OINTMENT | Freq: Two times a day (BID) | CUTANEOUS | Status: DC
  Administered 2012-12-28 – 2012-12-30 (×5): 1 via NASAL
  Filled 2012-12-28: qty 22

## 2012-12-28 MED ORDER — ONDANSETRON HCL 4 MG/2ML IJ SOLN: 4.0000 mg | Freq: Four times a day (QID) | INTRAMUSCULAR | Status: DC | PRN

## 2012-12-28 MED ORDER — ALBUTEROL SULFATE (5 MG/ML) 0.5% IN NEBU
INHALATION_SOLUTION | RESPIRATORY_TRACT | Status: AC
  Filled 2012-12-28: qty 0.5

## 2012-12-28 MED ORDER — POTASSIUM CHLORIDE IN NACL 20-0.9 MEQ/L-% IV SOLN
INTRAVENOUS | Status: DC
  Administered 2012-12-28: 18:00:00 via INTRAVENOUS
  Administered 2012-12-29: 75 mL/h via INTRAVENOUS
  Administered 2012-12-29 – 2012-12-30 (×2): via INTRAVENOUS

## 2012-12-28 MED ORDER — ACETAMINOPHEN 325 MG PO TABS
650.0000 mg | ORAL_TABLET | Freq: Four times a day (QID) | ORAL | Status: DC | PRN
  Administered 2012-12-28 – 2012-12-30 (×3): 650 mg via ORAL
  Filled 2012-12-28 (×3): qty 2

## 2012-12-28 MED ORDER — DIVALPROEX SODIUM 125 MG PO CPSP
250.0000 mg | ORAL_CAPSULE | Freq: Every day | ORAL | Status: DC
  Administered 2012-12-28 – 2012-12-30 (×3): 250 mg via ORAL
  Filled 2012-12-28 (×4): qty 2

## 2012-12-28 NOTE — Consult Note (Signed)
[facility]       CONSULTATION NOTE   Reason for Consult: Pre-operative evaluation  Requesting Physician: Dr. Juanetta Gosling   Cardiologist: Dr. Tresa Endo  HPI: This is a 78 y.o. female with a past medical history significant for coronary artery disease and peripheral arterial disease. She has a history of angioplasty and stenting to the LAD and right coronary artery in 2001. Just has a 5.2 cm abdominal aortic aneurysm which has been fairly stable and followed by [facility]. She was hospitalized in September 2013 due to avascular necrosis of the hips. An echocardiogram at that time showed an EF of 60-65% with rate 1 diastolic dysfunction. She also has a calcified aortic valve annulus but no aortic stenosis. She recently saw Dr. Tresa Endo in the office on 10/26/2012, at which time she was not complaining of any chest pain or worsening shortness of breath.  We are asked to consult regarding her perioperative risk for cholecystectomy.  PMHx:  Past Medical History  Diagnosis Date  . Hypertension   . Hyperlipidemia   . COPD (chronic obstructive pulmonary disease)     multiple hospitalizations for exacerbations with acute bronchitis  . Degenerative joint disease     Bilateral hips  . Depression with anxiety   . GERD (gastroesophageal reflux disease)     Hiatal hernia  . Atrial arrhythmia     Atrial fibrillation and atrial flutter diagnosed in the past;?  PSVT  . AAA (abdominal aortic aneurysm)     infrarenal; 07/2012: Diameter of 5.4 cm  . Arteriosclerotic cardiovascular disease (ASCVD)     Cardiac cath in 03/2000->  80% LAD, 70-80% RCA;  stent x1 in the LAD and x2 in the RCA with excellent angiographic outcome; repeat catheterization in late 2001 and 2003 revealed no restenosis; EF-50%  . Tobacco abuse, in remission     Quit in 2006; total consumption of 50-75 pack years  . Pituitary microadenoma     transsphenoidal resection in 09/2007  . Nephrolithiasis   . Anemia   .  Coronary artery disease    Past Surgical History  Procedure Date  . Kidney stone surgery   . Transphenoidal / transnasal hypophysectomy / resection pituitary tumor 09/2007    FAMHx: Family History  Problem Relation Age of Onset  . Heart disease Mother   . Hyperlipidemia Mother   . Hypertension Mother   . Stroke Mother   . Cancer Brother     SOCHx:  reports that she quit smoking about 7 years ago. Her smoking use included Cigarettes. She has a 75 pack-year smoking history. She quit smokeless tobacco use about 4 years ago. She reports that she does not drink alcohol or use illicit drugs.  ALLERGIES: Allergies  Allergen Reactions  . Asa (Aspirin) Other (See Comments)    "my ears bleed"  . Lidocaine Other (See Comments)    unknown  . Nsaids Other (See Comments)    unknown  . Sulfonamide Derivatives Other (See Comments)    unknown    ROS: A comprehensive review of systems was negative except for: Gastrointestinal: positive for abdominal pain  HOME MEDICATIONS: Prescriptions prior to admission  Medication Sig Dispense Refill  . acetaminophen (Q-PAP) 500 MG tablet Take 500 mg by mouth every 4 (four) hours as needed. For pain      . albuterol (PROVENTIL) (2.5 MG/3ML) 0.083% nebulizer solution Take 2.5 mg by nebulization every 6 (six) hours as needed. For shortness of breath      .  ALPRAZolam (XANAX) 0.5 MG tablet Take 0.5 mg by mouth every 8 (eight) hours as needed.      . Choline Fenofibrate (FENOFIBRIC ACID) 135 MG CPDR Take 1 tablet by mouth daily.      Marland Kitchen diltiazem (TIAZAC) 240 MG 24 hr capsule Take 240 mg by mouth daily.      . divalproex (DEPAKOTE SPRINKLE) 125 MG capsule Take 125-250 mg by mouth 2 (two) times daily. Take two capsules in the morning and take one capsule at bedtime      . furosemide (LASIX) 40 MG tablet Take 40 mg by mouth daily.      Marland Kitchen HYDROcodone-acetaminophen (NORCO) 10-325 MG per tablet Take 1 tablet by mouth 3 (three) times daily as needed.      .  nebivolol (BYSTOLIC) 5 MG tablet Take 5 mg by mouth daily.      Marland Kitchen omeprazole (PRILOSEC) 20 MG capsule Take 20 mg by mouth daily.      . rosuvastatin (CRESTOR) 10 MG tablet Take 10 mg by mouth at bedtime.      Marland Kitchen tiotropium (SPIRIVA) 18 MCG inhalation capsule Place 18 mcg into inhaler and inhale daily.      . traMADol (ULTRAM) 50 MG tablet Take 50 mg by mouth every 6 (six) hours as needed.      . traZODone (DESYREL) 50 MG tablet Take 25 mg by mouth at bedtime as needed. For sleep        HOSPITAL MEDICATIONS: Prior to Admission:  Prescriptions prior to admission  Medication Sig Dispense Refill  . acetaminophen (Q-PAP) 500 MG tablet Take 500 mg by mouth every 4 (four) hours as needed. For pain      . albuterol (PROVENTIL) (2.5 MG/3ML) 0.083% nebulizer solution Take 2.5 mg by nebulization every 6 (six) hours as needed. For shortness of breath      . ALPRAZolam (XANAX) 0.5 MG tablet Take 0.5 mg by mouth every 8 (eight) hours as needed.      . Choline Fenofibrate (FENOFIBRIC ACID) 135 MG CPDR Take 1 tablet by mouth daily.      Marland Kitchen diltiazem (TIAZAC) 240 MG 24 hr capsule Take 240 mg by mouth daily.      . divalproex (DEPAKOTE SPRINKLE) 125 MG capsule Take 125-250 mg by mouth 2 (two) times daily. Take two capsules in the morning and take one capsule at bedtime      . furosemide (LASIX) 40 MG tablet Take 40 mg by mouth daily.      Marland Kitchen HYDROcodone-acetaminophen (NORCO) 10-325 MG per tablet Take 1 tablet by mouth 3 (three) times daily as needed.      . nebivolol (BYSTOLIC) 5 MG tablet Take 5 mg by mouth daily.      Marland Kitchen omeprazole (PRILOSEC) 20 MG capsule Take 20 mg by mouth daily.      . rosuvastatin (CRESTOR) 10 MG tablet Take 10 mg by mouth at bedtime.      Marland Kitchen tiotropium (SPIRIVA) 18 MCG inhalation capsule Place 18 mcg into inhaler and inhale daily.      . traMADol (ULTRAM) 50 MG tablet Take 50 mg by mouth every 6 (six) hours as needed.      . traZODone (DESYREL) 50 MG tablet Take 25 mg by mouth at bedtime  as needed. For sleep        VITALS: Blood pressure 136/84, pulse 64, temperature 97.6 F (36.4 C), temperature source Oral, resp. rate 18, height 5\' 5"  (1.651 m), weight 72.576 kg (160 lb), SpO2 96.00%.  PHYSICAL EXAM: General appearance: alert, no distress and lying in bed Neck: no adenopathy, no carotid bruit, no JVD, supple, symmetrical, trachea midline and thyroid not enlarged, symmetric, no tenderness/mass/nodules Lungs: clear to auscultation bilaterally Heart: regular rate and rhythm, S1, S2 normal, no murmur, click, rub or gallop Abdomen: soft, non-tender; bowel sounds normal; no masses,  no organomegaly Extremities: extremities normal, atraumatic, no cyanosis or edema Pulses: 2+ and symmetric Skin: Skin color, texture, turgor normal. No rashes or lesions Neurologic: Grossly normal  LABS: Results for orders placed during the hospital encounter of 12/27/12 (from the past 48 hour(s))  CBC WITH DIFFERENTIAL     Status: Abnormal   Collection Time   12/27/12  9:03 PM      Component Value Range Comment   WBC 11.2 (*) 4.0 - 10.5 K/uL    RBC 4.84  3.87 - 5.11 MIL/uL    Hemoglobin 13.2  12.0 - 15.0 g/dL    HCT 16.1  09.6 - 04.5 %    MCV 84.5  78.0 - 100.0 fL    MCH 27.3  26.0 - 34.0 pg    MCHC 32.3  30.0 - 36.0 g/dL    RDW 40.9 (*) 81.1 - 15.5 %    Platelets 268  150 - 400 K/uL    Neutrophils Relative 60  43 - 77 %    Neutro Abs 6.6  1.7 - 7.7 K/uL    Lymphocytes Relative 28  12 - 46 %    Lymphs Abs 3.1  0.7 - 4.0 K/uL    Monocytes Relative 12  3 - 12 %    Monocytes Absolute 1.4 (*) 0.1 - 1.0 K/uL    Eosinophils Relative 0  0 - 5 %    Eosinophils Absolute 0.0  0.0 - 0.7 K/uL    Basophils Relative 0  0 - 1 %    Basophils Absolute 0.0  0.0 - 0.1 K/uL   COMPREHENSIVE METABOLIC PANEL     Status: Abnormal   Collection Time   12/27/12  9:03 PM      Component Value Range Comment   Sodium 141  135 - 145 mEq/L    Potassium 3.3 (*) 3.5 - 5.1 mEq/L    Chloride 101  96 - 112 mEq/L     CO2 28  19 - 32 mEq/L    Glucose, Bld 127 (*) 70 - 99 mg/dL    BUN 20  6 - 23 mg/dL    Creatinine, Ser 9.14 (*) 0.50 - 1.10 mg/dL    Calcium 9.4  8.4 - 78.2 mg/dL    Total Protein 7.7  6.0 - 8.3 g/dL    Albumin 2.9 (*) 3.5 - 5.2 g/dL    AST 14  0 - 37 U/L    ALT 13  0 - 35 U/L    Alkaline Phosphatase 116  39 - 117 U/L    Total Bilirubin 0.5  0.3 - 1.2 mg/dL    GFR calc non Af Amer 35 (*) >90 mL/min    GFR calc Af Amer 40 (*) >90 mL/min   VALPROIC ACID LEVEL     Status: Abnormal   Collection Time   12/27/12  9:03 PM      Component Value Range Comment   Valproic Acid Lvl 31.5 (*) 50.0 - 100.0 ug/mL   TROPONIN I     Status: Normal   Collection Time   12/27/12  9:03 PM      Component Value Range Comment  Troponin I <0.30  <0.30 ng/mL   URINALYSIS, ROUTINE W REFLEX MICROSCOPIC     Status: Abnormal   Collection Time   12/27/12 10:03 PM      Component Value Range Comment   Color, Urine YELLOW  YELLOW    APPearance CLEAR  CLEAR    Specific Gravity, Urine 1.020  1.005 - 1.030    pH 5.5  5.0 - 8.0    Glucose, UA NEGATIVE  NEGATIVE mg/dL    Hgb urine dipstick NEGATIVE  NEGATIVE    Bilirubin Urine NEGATIVE  NEGATIVE    Ketones, ur NEGATIVE  NEGATIVE mg/dL    Protein, ur NEGATIVE  NEGATIVE mg/dL    Urobilinogen, UA 2.0 (*) 0.0 - 1.0 mg/dL    Nitrite NEGATIVE  NEGATIVE    Leukocytes, UA NEGATIVE  NEGATIVE MICROSCOPIC NOT DONE ON URINES WITH NEGATIVE PROTEIN, BLOOD, LEUKOCYTES, NITRITE, OR GLUCOSE <1000 mg/dL.  LIPASE, BLOOD     Status: Normal   Collection Time   12/28/12 12:15 AM      Component Value Range Comment   Lipase 54  11 - 59 U/L   COMPREHENSIVE METABOLIC PANEL     Status: Abnormal   Collection Time   12/28/12  4:42 AM      Component Value Range Comment   Sodium 141  135 - 145 mEq/L    Potassium 3.1 (*) 3.5 - 5.1 mEq/L    Chloride 103  96 - 112 mEq/L    CO2 30  19 - 32 mEq/L    Glucose, Bld 93  70 - 99 mg/dL    BUN 19  6 - 23 mg/dL    Creatinine, Ser 1.61 (*) 0.50 - 1.10 mg/dL     Calcium 8.7  8.4 - 10.5 mg/dL    Total Protein 6.6  6.0 - 8.3 g/dL    Albumin 2.4 (*) 3.5 - 5.2 g/dL    AST 11  0 - 37 U/L    ALT 11  0 - 35 U/L    Alkaline Phosphatase 106  39 - 117 U/L    Total Bilirubin 0.4  0.3 - 1.2 mg/dL    GFR calc non Af Amer 39 (*) >90 mL/min    GFR calc Af Amer 45 (*) >90 mL/min   CBC     Status: Abnormal   Collection Time   12/28/12  4:42 AM      Component Value Range Comment   WBC 9.3  4.0 - 10.5 K/uL    RBC 4.28  3.87 - 5.11 MIL/uL    Hemoglobin 11.4 (*) 12.0 - 15.0 g/dL    HCT 09.6  04.5 - 40.9 %    MCV 85.0  78.0 - 100.0 fL    MCH 26.6  26.0 - 34.0 pg    MCHC 31.3  30.0 - 36.0 g/dL    RDW 81.1 (*) 91.4 - 15.5 %    Platelets 233  150 - 400 K/uL   MRSA PCR SCREENING     Status: Abnormal   Collection Time   12/28/12  8:48 AM      Component Value Range Comment   MRSA by PCR POSITIVE (*) NEGATIVE     IMAGING: Ct Abdomen Pelvis Wo Contrast  12/28/2012  *RADIOLOGY REPORT*  Clinical Data: Right upper quadrant pain.  Failure to thrive.  CT ABDOMEN AND PELVIS WITHOUT CONTRAST  Technique:  Multidetector CT imaging of the abdomen and pelvis was performed following the standard protocol without intravenous contrast.  Comparison:  CT abdomen and pelvis 08/04/2012.  Findings: Lung bases demonstrate some basilar atelectasis.  No pleural or pericardial effusion.  Again seen is an infrarenal abdominal aortic aneurysm measuring 5.1 x 5.2 cm in the axial plane.  There is no hemorrhage.  Extensive atherosclerotic vascular disease is identified.  The adrenal glands, spleen, liver and right kidney are unremarkable.  A few small gallstones without evidence of cholecystitis are noted.  A single small parenchymal calcification in the left kidney is unchanged.  Uterus, adnexa and urinary bladder are unremarkable.  The stomach, small and large bowel and appendix appear normal.  Marked flattening of the femoral heads bilaterally, worse on the left is unchanged.  IMPRESSION:  1.  No  acute finding. 2.  No change in an infrarenal abdominal aortic aneurysm.  No hemorrhage. 3.  No change in a nonobstructing stone lower pole left kidney. 4.  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 5.  Small gallstones without evidence of cholecystitis.   Original Report Authenticated By: Holley Dexter, M.D.    Dg Chest 2 View  12/27/2012  *RADIOLOGY REPORT*  Clinical Data: Weakness.  Altered mental status.  CHEST - 2 VIEW  Comparison: Plain film chest 08/31/2012 and 05/23/2012.  CT chest 08/17/2012.  Findings: Lungs are clear.  Heart size upper normal.  No pneumothorax or pleural fluid.  IMPRESSION: No acute disease.   Original Report Authenticated By: Holley Dexter, M.D.     IMPRESSION: 1. Low to intermediate risk for an intermediate risk procedure (cholecystectomy).  RECOMMENDATION: 1. Ms. Folden is not describing any symptoms of angina, however she is nonambulatory due to avascular necrosis of the hips, so this is difficult to fully evaluate.  A recent echocardiogram showed normal LV function without any significant valvular heart disease. She has a stable abdominal aortic aneurysm which does not meet criteria for surgery. With regards to cholecystectomy and the risk of general anesthesia I do not think that it would be risk prohibitive to consider surgery if gallstones could be conclusively demonstrated as the cause of her abdominal pain.  Unfortunately she will be a difficult rehabilitation candidate due to her immobility after surgery. I would recommend continuing her beta blocker perioperatively, as this is consistently shown to reduce the incidence of cardiac complications during surgery.  We will be available as needed postoperatively for any cardiac complications. Thanks for the consult.  Time Spent Directly with Patient: 30 minutes  Chrystie Nose, MD, [facility] Attending Cardiologist [facility]  Juliocesar Blasius C 12/28/2012, 12:53 PM

## 2012-12-28 NOTE — Progress Notes (Signed)
UR chart review completed.  

## 2012-12-28 NOTE — Clinical Social Work Psychosocial (Signed)
Clinical Social Work Department BRIEF PSYCHOSOCIAL ASSESSMENT 12/28/2012  Patient:  ALYSSAH, ALGEO     Account Number:  [id number]     Admit date:  12/27/2012  Clinical Social Worker:  Nancie Neas  Date/Time:  12/28/2012 02:15 PM  Referred by:  Physician  Date Referred:  12/28/2012 Referred for  ALF Placement   Other Referral:   Interview type:  Family Other interview type:   [name]- daughter    PSYCHOSOCIAL DATA Living Status:  FACILITY Admitted from facility:  [facility] Level of care:  Assisted Living Primary support name:  [name] Primary support relationship to patient:  CHILD, ADULT Degree of support available:   supportive    CURRENT CONCERNS Current Concerns  Post-Acute Placement   Other Concerns:    SOCIAL WORK ASSESSMENT / PLAN CSW attempted to meet with pt at bedside but pt asleep. CSW spoke with pt's daughter [name] who reports pt has been resident at [facility] for about two months. She transferred there from [facility] after completing therapy. [name] appears to be involved and supportive. She lives nearby and said she tries to visit a couple times a week. Per Reggie at facility, pt is a max assist to transfer to wheelchair and requires extensive assist with most ADLs. She can feed herself. Pt has home health with [facility] and is okay to return at d/c.   Assessment/plan status:  Psychosocial Support/Ongoing Assessment of Needs Other assessment/ plan:   Information/referral to community resources:   [facility]    PATIENT'S/FAMILY'S RESPONSE TO PLAN OF CARE: Pt unable to discuss plan of care at this time. Family report they are pleased with the care at [facility] and request her return there when medically stable. CSW to continue to follow.        Derenda Fennel, [place] [phone]

## 2012-12-28 NOTE — Progress Notes (Signed)
CRITICAL VALUE ALERT  Critical value received:  MRSA positive  Date of notification:  12/29/11  Time of notification:  1219  Critical value read back:yes  Nurse who received alert:  Dagoberto Ligas, RN  MD notified (1st page):  Dr. Juanetta Gosling  Time of first page:  1800  MD notified (2nd page):  Time of second page:  Responding MD:  Dr. Juanetta Gosling  Time MD responded:  1800

## 2012-12-28 NOTE — Progress Notes (Signed)
She was admitted with abdominal pain. She has a history of gallstones. This is thought to be related to her gallbladder. She has not been felt to be a good operative candidate for surgery for her known aseptic necrosis of the hips. She's going to have scans done today ultrasounds and I am going to go ahead and ask for cardiology consultation with [facility] cardiovascular who has seen her before regarding whether she is appropriate candidate for surgery or not

## 2012-12-28 NOTE — H&P (Signed)
[facility] History and Physical  [name] M Majchrzak UJW:[id number] DOB: 1935-11-30 DOA: 12/27/2012   PCP: Fredirick Maudlin, MD   Chief Complaint: difficulty with urination and poor apetite  HPI: [name] M Kaleta is a 78 y.o. female with a past medical history of hypertension, COPD, who lives in an assisted living facility. Patient is a very poor historian and is hard of hearing. So history is very limited from her. She is concerned about abdominal pain, that she's had on and off for a long time. This current episode started last Sunday. She tells me that she has a kidney stone, gallstones, and abdominal aortic aneurysm. She has apparently been told that the stones cannot be removed. According to initial reports she came in because she's had some increased frequency with urination. She has been confused at the assisted living facility as well. She's had some dry heaves according to the patient. Denies any fever or chills. Has been feeling weak. The pain in the abdomen is located in the right upper area. She's unable to quantify the pain. Overall, a very poor historian.  Home Medications: Prior to Admission medications   Medication Sig Start Date End Date Taking? Authorizing Provider  acetaminophen (Q-PAP) 500 MG tablet Take 500 mg by mouth every 4 (four) hours as needed. For pain   Yes Historical Provider, MD  albuterol (PROVENTIL) (2.5 MG/3ML) 0.083% nebulizer solution Take 2.5 mg by nebulization every 6 (six) hours as needed. For shortness of breath   Yes Historical Provider, MD  ALPRAZolam (XANAX) 0.5 MG tablet Take 0.5 mg by mouth every 8 (eight) hours as needed.   Yes Historical Provider, MD  Choline Fenofibrate (FENOFIBRIC ACID) 135 MG CPDR Take 1 tablet by mouth daily.   Yes Historical Provider, MD  diltiazem (TIAZAC) 240 MG 24 hr capsule Take 240 mg by mouth daily.   Yes Historical Provider, MD  divalproex (DEPAKOTE SPRINKLE) 125 MG capsule Take 125-250 mg by mouth 2 (two) times daily.  Take two capsules in the morning and take one capsule at bedtime   Yes Historical Provider, MD  furosemide (LASIX) 40 MG tablet Take 40 mg by mouth daily.   Yes Historical Provider, MD  HYDROcodone-acetaminophen (NORCO) 10-325 MG per tablet Take 1 tablet by mouth 3 (three) times daily as needed.   Yes Historical Provider, MD  nebivolol (BYSTOLIC) 5 MG tablet Take 5 mg by mouth daily.   Yes Historical Provider, MD  omeprazole (PRILOSEC) 20 MG capsule Take 20 mg by mouth daily.   Yes Historical Provider, MD  rosuvastatin (CRESTOR) 10 MG tablet Take 10 mg by mouth at bedtime.   Yes Historical Provider, MD  tiotropium (SPIRIVA) 18 MCG inhalation capsule Place 18 mcg into inhaler and inhale daily.   Yes Historical Provider, MD  traMADol (ULTRAM) 50 MG tablet Take 50 mg by mouth every 6 (six) hours as needed.   Yes Historical Provider, MD  traZODone (DESYREL) 50 MG tablet Take 25 mg by mouth at bedtime as needed. For sleep   Yes Historical Provider, MD    Allergies:  Allergies  Allergen Reactions  . Asa (Aspirin) Other (See Comments)    "my ears bleed"  . Lidocaine Other (See Comments)    unknown  . Nsaids Other (See Comments)    unknown  . Sulfonamide Derivatives Other (See Comments)    unknown    Past Medical History: Past Medical History  Diagnosis Date  . Hypertension   . Hyperlipidemia   . COPD (chronic obstructive pulmonary  disease)     multiple hospitalizations for exacerbations with acute bronchitis  . Degenerative joint disease     Bilateral hips  . Depression with anxiety   . GERD (gastroesophageal reflux disease)     Hiatal hernia  . Atrial arrhythmia     Atrial fibrillation and atrial flutter diagnosed in the past;?  PSVT  . AAA (abdominal aortic aneurysm)     infrarenal; 07/2012: Diameter of 5.4 cm  . Arteriosclerotic cardiovascular disease (ASCVD)     Cardiac cath in 03/2000->  80% LAD, 70-80% RCA;  stent x1 in the LAD and x2 in the RCA with excellent angiographic  outcome; repeat catheterization in late 2001 and 2003 revealed no restenosis; EF-50%  . Tobacco abuse, in remission     Quit in 2006; total consumption of 50-75 pack years  . Pituitary microadenoma     transsphenoidal resection in 09/2007  . Nephrolithiasis   . Anemia   . Coronary artery disease     Past Surgical History  Procedure Date  . Kidney stone surgery   . Transphenoidal / transnasal hypophysectomy / resection pituitary tumor 09/2007    Social History:  reports that she quit smoking about 7 years ago. Her smoking use included Cigarettes. She has a 75 pack-year smoking history. She quit smokeless tobacco use about 4 years ago. She reports that she does not drink alcohol or use illicit drugs.  Living Situation: Lives in an assisted living facility Activity Level: Wheelchair-bound   Family History:  Family History  Problem Relation Age of Onset  . Heart disease Mother   . Hyperlipidemia Mother   . Hypertension Mother   . Stroke Mother   . Cancer Brother     Review of Systems - unobtainable from patient due to mental status  Physical Examination  Filed Vitals:   12/27/12 2050 12/28/12 0018  BP: 124/76 103/42  Pulse: 61 53  Temp: 97.3 F (36.3 C) 97.4 F (36.3 C)  TempSrc: Oral Oral  Resp: 20 20  Height: 5\' 5"  (1.651 m)   Weight: 72.576 kg (160 lb)   SpO2: 97% 100%    General appearance: cooperative, appears stated age, distracted and no distress Head: Normocephalic, without obvious abnormality, atraumatic Eyes: conjunctivae/corneas clear. PERRL, EOM's intact. Throat: lips, mucosa, and tongue normal; teeth and gums normal Neck: no adenopathy, no carotid bruit, no JVD, supple, symmetrical, trachea midline and thyroid not enlarged, symmetric, no tenderness/mass/nodules Resp: clear to auscultation bilaterally Cardio: regular rate and rhythm, S1, S2 normal, no murmur, click, rub or gallop GI: Abdomen is soft. There is tenderness in the right upper quadrant.  Murphy sign was equivocal. No rebound, rigidity, or guarding. No masses or organomegaly. Bowel sounds are present. Extremities: extremities normal, atraumatic, no cyanosis or edema Pulses: 2+ and symmetric Skin: Skin color, texture, turgor normal. No rashes or lesions Lymph nodes: Cervical, supraclavicular, and axillary nodes normal. Neurologic: She is alert. Somewhat confused. She is hard of hearing. No focal neurological deficits are present.  Laboratory Data: Results for orders placed during the hospital encounter of 12/27/12 (from the past 48 hour(s))  CBC WITH DIFFERENTIAL     Status: Abnormal   Collection Time   12/27/12  9:03 PM      Component Value Range Comment   WBC 11.2 (*) 4.0 - 10.5 K/uL    RBC 4.84  3.87 - 5.11 MIL/uL    Hemoglobin 13.2  12.0 - 15.0 g/dL    HCT 40.9  81.1 - 91.4 %  MCV 84.5  78.0 - 100.0 fL    MCH 27.3  26.0 - 34.0 pg    MCHC 32.3  30.0 - 36.0 g/dL    RDW 96.0 (*) 45.4 - 15.5 %    Platelets 268  150 - 400 K/uL    Neutrophils Relative 60  43 - 77 %    Neutro Abs 6.6  1.7 - 7.7 K/uL    Lymphocytes Relative 28  12 - 46 %    Lymphs Abs 3.1  0.7 - 4.0 K/uL    Monocytes Relative 12  3 - 12 %    Monocytes Absolute 1.4 (*) 0.1 - 1.0 K/uL    Eosinophils Relative 0  0 - 5 %    Eosinophils Absolute 0.0  0.0 - 0.7 K/uL    Basophils Relative 0  0 - 1 %    Basophils Absolute 0.0  0.0 - 0.1 K/uL   COMPREHENSIVE METABOLIC PANEL     Status: Abnormal   Collection Time   12/27/12  9:03 PM      Component Value Range Comment   Sodium 141  135 - 145 mEq/L    Potassium 3.3 (*) 3.5 - 5.1 mEq/L    Chloride 101  96 - 112 mEq/L    CO2 28  19 - 32 mEq/L    Glucose, Bld 127 (*) 70 - 99 mg/dL    BUN 20  6 - 23 mg/dL    Creatinine, Ser 0.98 (*) 0.50 - 1.10 mg/dL    Calcium 9.4  8.4 - 11.9 mg/dL    Total Protein 7.7  6.0 - 8.3 g/dL    Albumin 2.9 (*) 3.5 - 5.2 g/dL    AST 14  0 - 37 U/L    ALT 13  0 - 35 U/L    Alkaline Phosphatase 116  39 - 117 U/L    Total Bilirubin  0.5  0.3 - 1.2 mg/dL    GFR calc non Af Amer 35 (*) >90 mL/min    GFR calc Af Amer 40 (*) >90 mL/min   VALPROIC ACID LEVEL     Status: Abnormal   Collection Time   12/27/12  9:03 PM      Component Value Range Comment   Valproic Acid Lvl 31.5 (*) 50.0 - 100.0 ug/mL   TROPONIN I     Status: Normal   Collection Time   12/27/12  9:03 PM      Component Value Range Comment   Troponin I <0.30  <0.30 ng/mL   URINALYSIS, ROUTINE W REFLEX MICROSCOPIC     Status: Abnormal   Collection Time   12/27/12 10:03 PM      Component Value Range Comment   Color, Urine YELLOW  YELLOW    APPearance CLEAR  CLEAR    Specific Gravity, Urine 1.020  1.005 - 1.030    pH 5.5  5.0 - 8.0    Glucose, UA NEGATIVE  NEGATIVE mg/dL    Hgb urine dipstick NEGATIVE  NEGATIVE    Bilirubin Urine NEGATIVE  NEGATIVE    Ketones, ur NEGATIVE  NEGATIVE mg/dL    Protein, ur NEGATIVE  NEGATIVE mg/dL    Urobilinogen, UA 2.0 (*) 0.0 - 1.0 mg/dL    Nitrite NEGATIVE  NEGATIVE    Leukocytes, UA NEGATIVE  NEGATIVE MICROSCOPIC NOT DONE ON URINES WITH NEGATIVE PROTEIN, BLOOD, LEUKOCYTES, NITRITE, OR GLUCOSE <1000 mg/dL.    Radiology Reports: Dg Chest 2 View  12/27/2012  *RADIOLOGY REPORT*  Clinical Data:  Weakness.  Altered mental status.  CHEST - 2 VIEW  Comparison: Plain film chest 08/31/2012 and 05/23/2012.  CT chest 08/17/2012.  Findings: Lungs are clear.  Heart size upper normal.  No pneumothorax or pleural fluid.  IMPRESSION: No acute disease.   Original Report Authenticated By: Holley Dexter, M.D.     Electrocardiogram: Sinus bradycardia at 50 beats per minute, right bundle branch block. No Q waves. Nonspecific T wave, changes. Poor quality EKG.  Problem List  Principal Problem:  *RUQ abdominal pain Active Problems:  AAA (abdominal aortic aneurysm)  Failure to thrive in adult  Dehydration  Hypokalemia   Assessment: This is a 78 year old, Caucasian female, comes in with urinary frequency, failure to thrive, and complaints  of abdominal pain. She has right upper quadrant tenderness. She has a history of cholelithiasis, left nephrolithiasis, and an abdominal aortic aneurysm. This is based off a CT that was done in August of 2013. Her symptoms and examination is suggestive of a gallbladder pathology. She's also dehydrated.  Plan: #1 abdominal pain in the right upper quadrant. This is most likely gallbladder in etiology. Because of her AAA we will first get a CT abdnomen. We will get ultrasound of the abdomen in the morning. We'll keep her n.p.o. LFTs are reassuringly normal. This will be repeated in the morning. A lipase level will be checked.  #2 dehydration with failure to thrive: Give her IV fluids. Monitor on telemetry. Renal function appears to be abnormal compared to previous levels. We will recheck after she has received IV fluids.  #3 history of hypertension: Blood pressure is running somewhat on the lower side. She also is bradycardic. We will hold her antihypertensive agents for tonight. Monitor her on telemetry.  #4 right leg swelling: She has a history of PE. So, we will check a venous Doppler to rule out DVT.  Further management decisions will depend on results of further testing and patient's response to treatment.  Dr. Juanetta Gosling will assume care in the morning  Code Status: Full code Family Communication: No family at bedside  Disposition Plan: Will likely return to assisted living facility when improved   [facility]  [facility] Pager [phone]  If 7PM-7AM, please contact night-coverage [email or web address] Password [facility]  12/28/2012, 12:22 AM

## 2012-12-28 NOTE — Care Management Note (Addendum)
    Page 1 of 2   12/30/2012     3:24:05 PM   CARE MANAGEMENT NOTE 12/30/2012  Patient:  [name] [name], [name] [name]   Account Number:  [id number]  Date Initiated:  12/28/2012  Documentation initiated by:  Sharrie Rothman  Subjective/Objective Assessment:   Pt admitted from [facility] with abd pain. Pt will return at discharge. Per facility, pt is maximun assist with ADL's and daily care.     Action/Plan:   CSW will arrange discharge to facility when medically stable.   Anticipated DC Date:  12/31/2012   Anticipated DC Plan:  ASSISTED LIVING / REST HOME  In-house referral  Clinical Social Worker      DC Associate Professor  CM consult      [facility] Choice  HOME HEALTH   Choice offered to / List presented to:  C-2 HC POA / Guardian        HH arranged  HH-1 RN  HH-2 PT      HH agency  [facility]   Status of service:  Completed, signed off Medicare Important Message given?   (If response is "NO", the following Medicare IM given date fields will be blank) Date Medicare IM given:   Date Additional Medicare IM given:    Discharge Disposition:  ASSISTED LIVING  Per UR Regulation:    If discussed at Long Length of Stay Meetings, dates discussed:    Comments:  12/30/12 1115 [name] Queen, RN BSN CM Pt to be discharged today to [facility]. CSW will arrange discharge to facility.  Alroy Bailiff of [facility] is aware of RN and PT resumption at facility.  12/28/12 1205 [name] Queen, RN BSN CM

## 2012-12-29 DIAGNOSIS — J449 Chronic obstructive pulmonary disease, unspecified: Secondary | ICD-10-CM | POA: Diagnosis not present

## 2012-12-29 DIAGNOSIS — R109 Unspecified abdominal pain: Secondary | ICD-10-CM | POA: Diagnosis not present

## 2012-12-29 MED ORDER — ALBUTEROL SULFATE (5 MG/ML) 0.5% IN NEBU
2.5000 mg | INHALATION_SOLUTION | Freq: Four times a day (QID) | RESPIRATORY_TRACT | Status: DC
  Administered 2012-12-30 (×2): 2.5 mg via RESPIRATORY_TRACT
  Filled 2012-12-29 (×3): qty 0.5

## 2012-12-29 NOTE — Progress Notes (Deleted)
UR chart review completed.  

## 2012-12-29 NOTE — Consult Note (Signed)
Reason for Consult: Cholelithiasis Referring Physician: Dr. Hawkins  [name] [name] is an 78 y.o. female.  HPI: Patient presented to [facility] with a history of right upper quadrant abdominal pain. Patient states the pain started yesterday however on evaluating the patient's chart it appears spent on for several days. She denies any similar symptomatology in the past. No radiation. No exacerbating or relieving factors. She has had some associated nausea but no emesis. No fevers or chills. No change in bowel movements. No melena, no hematochezia, no acholic stools. Patient's had no history of jaundice. No change in urination. No significant family history the patient is aware of of biliary disease. Currently patient states her pain is feeling somewhat better although still present. She has been tolerating a diet both last night as well as this morning.  Past Medical History  Diagnosis Date  . Hypertension   . Hyperlipidemia   . COPD (chronic obstructive pulmonary disease)     multiple hospitalizations for exacerbations with acute bronchitis  . Degenerative joint disease     Bilateral hips  . Depression with anxiety   . GERD (gastroesophageal reflux disease)     Hiatal hernia  . Atrial arrhythmia     Atrial fibrillation and atrial flutter diagnosed in the past;?  PSVT  . AAA (abdominal aortic aneurysm)     infrarenal; 07/2012: Diameter of 5.4 cm  . Arteriosclerotic cardiovascular disease (ASCVD)     Cardiac cath in 03/2000->  80% LAD, 70-80% RCA;  stent x1 in the LAD and x2 in the RCA with excellent angiographic outcome; repeat catheterization in late 2001 and 2003 revealed no restenosis; EF-50%  . Tobacco abuse, in remission     Quit in 2006; total consumption of 50-75 pack years  . Pituitary microadenoma     transsphenoidal resection in 09/2007  . Nephrolithiasis   . Anemia   . Coronary artery disease     Past Surgical History  Procedure Date  . Kidney stone surgery   .  Transphenoidal / transnasal hypophysectomy / resection pituitary tumor 09/2007    Family History  Problem Relation Age of Onset  . Heart disease Mother   . Hyperlipidemia Mother   . Hypertension Mother   . Stroke Mother   . Cancer Brother     Social History:  reports that she quit smoking about 7 years ago. Her smoking use included Cigarettes. She has a 75 pack-year smoking history. She quit smokeless tobacco use about 4 years ago. She reports that she does not drink alcohol or use illicit drugs.  Allergies:  Allergies  Allergen Reactions  . Asa (Aspirin) Other (See Comments)    "my ears bleed"  . Lidocaine Other (See Comments)    unknown  . Nsaids Other (See Comments)    unknown  . Sulfonamide Derivatives Other (See Comments)    unknown    Medications:  I have reviewed the patient's current medications. Prior to Admission:  Prescriptions prior to admission  Medication Sig Dispense Refill  . acetaminophen (Q-PAP) 500 MG tablet Take 500 mg by mouth every 4 (four) hours as needed. For pain      . albuterol (PROVENTIL) (2.5 MG/3ML) 0.083% nebulizer solution Take 2.5 mg by nebulization every 6 (six) hours as needed. For shortness of breath      . ALPRAZolam (XANAX) 0.5 MG tablet Take 0.5 mg by mouth every 8 (eight) hours as needed.      . Choline Fenofibrate (FENOFIBRIC ACID) 135 MG CPDR Take  1 tablet by mouth daily.      Marland Kitchen diltiazem (TIAZAC) 240 MG 24 hr capsule Take 240 mg by mouth daily.      . divalproex (DEPAKOTE SPRINKLE) 125 MG capsule Take 125-250 mg by mouth 2 (two) times daily. Take two capsules in the morning and take one capsule at bedtime      . furosemide (LASIX) 40 MG tablet Take 40 mg by mouth daily.      Marland Kitchen HYDROcodone-acetaminophen (NORCO) 10-325 MG per tablet Take 1 tablet by mouth 3 (three) times daily as needed.      . nebivolol (BYSTOLIC) 5 MG tablet Take 5 mg by mouth daily.      Marland Kitchen omeprazole (PRILOSEC) 20 MG capsule Take 20 mg by mouth daily.      .  rosuvastatin (CRESTOR) 10 MG tablet Take 10 mg by mouth at bedtime.      Marland Kitchen tiotropium (SPIRIVA) 18 MCG inhalation capsule Place 18 mcg into inhaler and inhale daily.      . traMADol (ULTRAM) 50 MG tablet Take 50 mg by mouth every 6 (six) hours as needed.      . traZODone (DESYREL) 50 MG tablet Take 25 mg by mouth at bedtime as needed. For sleep       Scheduled:   . albuterol  2.5 mg Nebulization Q6H  . atorvastatin  20 mg Oral q1800  . Chlorhexidine Gluconate Cloth  6 each Topical Q0600  . divalproex  250 mg Oral Daily   And  . divalproex  125 mg Oral QHS  . heparin  5,000 Units Subcutaneous Q8H  . mupirocin ointment  1 application Nasal BID  . pantoprazole  40 mg Oral Daily  . sodium chloride  3 mL Intravenous Q12H  . tiotropium  18 mcg Inhalation Daily   Continuous:   . 0.9 % NaCl with KCl 20 mEq / L 75 mL/hr at 12/29/12 4098   JXB:[id number], acetaminophen, albuterol, ALPRAZolam, ondansetron (ZOFRAN) IV, ondansetron, traZODone  Results for orders placed during the hospital encounter of 12/27/12 (from the past 48 hour(s))  CBC WITH DIFFERENTIAL     Status: Abnormal   Collection Time   12/27/12  9:03 PM      Component Value Range Comment   WBC 11.2 (*) 4.0 - 10.5 K/uL    RBC 4.84  3.87 - 5.11 MIL/uL    Hemoglobin 13.2  12.0 - 15.0 g/dL    HCT 65.7  84.6 - 96.2 %    MCV 84.5  78.0 - 100.0 fL    MCH 27.3  26.0 - 34.0 pg    MCHC 32.3  30.0 - 36.0 g/dL    RDW 95.2 (*) 84.1 - 15.5 %    Platelets 268  150 - 400 K/uL    Neutrophils Relative 60  43 - 77 %    Neutro Abs 6.6  1.7 - 7.7 K/uL    Lymphocytes Relative 28  12 - 46 %    Lymphs Abs 3.1  0.7 - 4.0 K/uL    Monocytes Relative 12  3 - 12 %    Monocytes Absolute 1.4 (*) 0.1 - 1.0 K/uL    Eosinophils Relative 0  0 - 5 %    Eosinophils Absolute 0.0  0.0 - 0.7 K/uL    Basophils Relative 0  0 - 1 %    Basophils Absolute 0.0  0.0 - 0.1 K/uL   COMPREHENSIVE METABOLIC PANEL     Status: Abnormal   Collection Time  12/27/12   9:03 PM      Component Value Range Comment   Sodium 141  135 - 145 mEq/L    Potassium 3.3 (*) 3.5 - 5.1 mEq/L    Chloride 101  96 - 112 mEq/L    CO2 28  19 - 32 mEq/L    Glucose, Bld 127 (*) 70 - 99 mg/dL    BUN 20  6 - 23 mg/dL    Creatinine, Ser 9.60 (*) 0.50 - 1.10 mg/dL    Calcium 9.4  8.4 - 45.4 mg/dL    Total Protein 7.7  6.0 - 8.3 g/dL    Albumin 2.9 (*) 3.5 - 5.2 g/dL    AST 14  0 - 37 U/L    ALT 13  0 - 35 U/L    Alkaline Phosphatase 116  39 - 117 U/L    Total Bilirubin 0.5  0.3 - 1.2 mg/dL    GFR calc non Af Amer 35 (*) >90 mL/min    GFR calc Af Amer 40 (*) >90 mL/min   VALPROIC ACID LEVEL     Status: Abnormal   Collection Time   12/27/12  9:03 PM      Component Value Range Comment   Valproic Acid Lvl 31.5 (*) 50.0 - 100.0 ug/mL   TROPONIN I     Status: Normal   Collection Time   12/27/12  9:03 PM      Component Value Range Comment   Troponin I <0.30  <0.30 ng/mL   URINALYSIS, ROUTINE W REFLEX MICROSCOPIC     Status: Abnormal   Collection Time   12/27/12 10:03 PM      Component Value Range Comment   Color, Urine YELLOW  YELLOW    APPearance CLEAR  CLEAR    Specific Gravity, Urine 1.020  1.005 - 1.030    pH 5.5  5.0 - 8.0    Glucose, UA NEGATIVE  NEGATIVE mg/dL    Hgb urine dipstick NEGATIVE  NEGATIVE    Bilirubin Urine NEGATIVE  NEGATIVE    Ketones, ur NEGATIVE  NEGATIVE mg/dL    Protein, ur NEGATIVE  NEGATIVE mg/dL    Urobilinogen, UA 2.0 (*) 0.0 - 1.0 mg/dL    Nitrite NEGATIVE  NEGATIVE    Leukocytes, UA NEGATIVE  NEGATIVE MICROSCOPIC NOT DONE ON URINES WITH NEGATIVE PROTEIN, BLOOD, LEUKOCYTES, NITRITE, OR GLUCOSE <1000 mg/dL.  LIPASE, BLOOD     Status: Normal   Collection Time   12/28/12 12:15 AM      Component Value Range Comment   Lipase 54  11 - 59 U/L   COMPREHENSIVE METABOLIC PANEL     Status: Abnormal   Collection Time   12/28/12  4:42 AM      Component Value Range Comment   Sodium 141  135 - 145 mEq/L    Potassium 3.1 (*) 3.5 - 5.1 mEq/L    Chloride  103  96 - 112 mEq/L    CO2 30  19 - 32 mEq/L    Glucose, Bld 93  70 - 99 mg/dL    BUN 19  6 - 23 mg/dL    Creatinine, Ser 0.98 (*) 0.50 - 1.10 mg/dL    Calcium 8.7  8.4 - 11.9 mg/dL    Total Protein 6.6  6.0 - 8.3 g/dL    Albumin 2.4 (*) 3.5 - 5.2 g/dL    AST 11  0 - 37 U/L    ALT 11  0 - 35 U/L  Alkaline Phosphatase 106  39 - 117 U/L    Total Bilirubin 0.4  0.3 - 1.2 mg/dL    GFR calc non Af Amer 39 (*) >90 mL/min    GFR calc Af Amer 45 (*) >90 mL/min   CBC     Status: Abnormal   Collection Time   12/28/12  4:42 AM      Component Value Range Comment   WBC 9.3  4.0 - 10.5 K/uL    RBC 4.28  3.87 - 5.11 MIL/uL    Hemoglobin 11.4 (*) 12.0 - 15.0 g/dL    HCT 40.9  81.1 - 91.4 %    MCV 85.0  78.0 - 100.0 fL    MCH 26.6  26.0 - 34.0 pg    MCHC 31.3  30.0 - 36.0 g/dL    RDW 78.2 (*) 95.6 - 15.5 %    Platelets 233  150 - 400 K/uL   MRSA PCR SCREENING     Status: Abnormal   Collection Time   12/28/12  8:48 AM      Component Value Range Comment   MRSA by PCR POSITIVE (*) NEGATIVE     Ct Abdomen Pelvis Wo Contrast  12/28/2012  *RADIOLOGY REPORT*  Clinical Data: Right upper quadrant pain.  Failure to thrive.  CT ABDOMEN AND PELVIS WITHOUT CONTRAST  Technique:  Multidetector CT imaging of the abdomen and pelvis was performed following the standard protocol without intravenous contrast.  Comparison: CT abdomen and pelvis 08/04/2012.  Findings: Lung bases demonstrate some basilar atelectasis.  No pleural or pericardial effusion.  Again seen is an infrarenal abdominal aortic aneurysm measuring 5.1 x 5.2 cm in the axial plane.  There is no hemorrhage.  Extensive atherosclerotic vascular disease is identified.  The adrenal glands, spleen, liver and right kidney are unremarkable.  A few small gallstones without evidence of cholecystitis are noted.  A single small parenchymal calcification in the left kidney is unchanged.  Uterus, adnexa and urinary bladder are unremarkable.  The stomach, small and large  bowel and appendix appear normal.  Marked flattening of the femoral heads bilaterally, worse on the left is unchanged.  IMPRESSION:  1.  No acute finding. 2.  No change in an infrarenal abdominal aortic aneurysm.  No hemorrhage. 3.  No change in a nonobstructing stone lower pole left kidney. 4.  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 5.  Small gallstones without evidence of cholecystitis.   Original Report Authenticated By: Holley Dexter, M.D.    Dg Chest 2 View  12/27/2012  *RADIOLOGY REPORT*  Clinical Data: Weakness.  Altered mental status.  CHEST - 2 VIEW  Comparison: Plain film chest 08/31/2012 and 05/23/2012.  CT chest 08/17/2012.  Findings: Lungs are clear.  Heart size upper normal.  No pneumothorax or pleural fluid.  IMPRESSION: No acute disease.   Original Report Authenticated By: Holley Dexter, M.D.    US Abdomen Complete  12/28/2012  *RADIOLOGY REPORT*  Clinical Data:  Abdominal pain, cholelithiasis, AAA.  COMPLETE ABDOMINAL ULTRASOUND  Comparison:  CT 12/28/2012  Findings:  Gallbladder:  Small layering stones within the gallbladder. Gallbladder wall within normal limits in thickness at 2.7 mm. Negative sonographic Murphy's. Sludge also noted within the gallbladder.  Common bile duct:   Normal caliber, 7 mm.  Liver:  No focal lesion identified.  Within normal limits in parenchymal echogenicity.  IVC:  Appears normal.  Pancreas:  No focal abnormality seen.  Spleen:  Within normal limits in size and echotexture.  Right Kidney:   1.4 cm cyst in the upper pole.  No hydronephrosis. Normal echotexture and size.  Left Kidney:  Normal in size and parenchymal echogenicity.  No evidence of mass or hydronephrosis.  Abdominal aorta:  Abdominal aortic aneurysm noted.  This measures maximally 5.4 cm.  Extensive mural plaque.  IMPRESSION: Cholelithiasis.  Gallbladder sludge.  No sonographic evidence of acute cholecystitis.  5.4 cm  focal abdominal aortic aneurysm with extensive mural plaque.   Original Report Authenticated By: Charlett Nose, M.D.    US Venous Img Lower Bilateral  12/28/2012  *RADIOLOGY REPORT*  Clinical Data: Bilateral lower extremity swelling, pain and edema, history of CHF, evaluate for DVT  BILATERAL LOWER EXTREMITY VENOUS DUPLEX ULTRASOUND  Technique:  Gray-scale sonography with graded compression, as well as color Doppler and duplex ultrasound, were performed to evaluate the deep venous system of both lower extremities from the level of the common femoral vein through the popliteal and proximal calf veins.  Spectral Doppler was utilized to evaluate flow at rest and with distal augmentation maneuvers.  Comparison:  None.  Findings:  Normal compressibility of bilateral common femoral, superficial femoral, and popliteal veins is demonstrated, as well as the visualized proximal calf veins.  No filling defects to suggest DVT on grayscale or color Doppler imaging.  Doppler waveforms show normal direction of venous flow, normal respiratory phasicity and response to augmentation.  IMPRESSION: No evidence of deep vein thrombosis within either lower extremity.   Original Report Authenticated By: Tacey Ruiz, MD     Review of Systems  Constitutional: Negative for fever, chills, weight loss, malaise/fatigue and diaphoresis.  HENT: Negative.   Eyes: Negative.   Respiratory: Negative.   Cardiovascular: Negative.   Gastrointestinal: Positive for heartburn, nausea and abdominal pain (right upper quadrant). Negative for vomiting, diarrhea, constipation, blood in stool and melena.  Genitourinary: Negative for dysuria, urgency, frequency, hematuria and flank pain.  Skin: Negative.   Neurological: Negative.  Negative for weakness.  Endo/Heme/Allergies: Negative.   Psychiatric/Behavioral: Negative.    Blood pressure 120/59, pulse 60, temperature 98.1 F (36.7 C), temperature source Oral, resp. rate 20, height 5\' 5"  (1.651  m), weight 72.576 kg (160 lb), SpO2 94.00%. Physical Exam  Constitutional: She is oriented to person, place, and time. She appears well-developed and well-nourished. No distress.       Somewhat hard of hearing  HENT:  Head: Normocephalic and atraumatic.  Eyes: Conjunctivae normal and EOM are normal. Pupils are equal, round, and reactive to light. No scleral icterus.  Neck: Normal range of motion. Neck supple. No tracheal deviation present. No thyromegaly present.  Cardiovascular: Normal rate, regular rhythm and normal heart sounds.   Respiratory: Effort normal and breath sounds normal. No respiratory distress.  GI: Soft. Bowel sounds are normal. She exhibits no distension and no mass. There is tenderness (mild right upper quadrant abdominal tenderness.  No peritoneal signs.). There is no rebound and no guarding.  Lymphadenopathy:    She has no cervical adenopathy.  Neurological: She is alert and oriented to person, place, and time.  Skin: Skin is warm and dry.    Assessment/Plan: Cholelithiasis. Given patient's other medical problems and her continued improvement did discuss with the patient at length intervention. I do feel at this time we can trial advancement of her diet. Should she continue to tolerated diet we'll continue to have a normal white blood cell count feel it is appropriate that discharge can be undertaken  the next 24 hours. I do feel that should she continue to have symptoms regarding her gallbladder then at that time as can be addressed as an outpatient. Should she not tolerate advancement or continue to have evidence a leukocytosis or change in her clinical presentation I do feel at that time and reevaluate surgical intervention. I discussed this at length the patient. I will also discussed with Dr. Juanetta Gosling.  I will continue to follow the patient's course.  Dayan Kreis C 12/29/2012, 12:36 PM

## 2012-12-29 NOTE — Progress Notes (Signed)
Subjective: She says her abdomen is somewhat better but she is having some back pain. She has no other new complaints  Objective: Vital signs in last 24 hours: Temp:  [97.6 F (36.4 C)-98.1 F (36.7 C)] 98.1 F (36.7 C) (01/07 2136) Pulse Rate:  [60-64] 60  (01/07 2136) Resp:  [18-20] 20  (01/07 2136) BP: (120-136)/(59-84) 120/59 mmHg (01/07 2136) SpO2:  [93 %-96 %] 94 % (01/08 0728) Weight change:  Last BM Date: 12/28/12  Intake/Output from previous day: 01/07 0701 - 01/08 0700 In: 1140 [P.O.:240; I.V.:900] Out: 600 [Urine:600]  PHYSICAL EXAM General appearance: alert, cooperative and mild distress Resp: clear to auscultation bilaterally Cardio: regular rate and rhythm, S1, S2 normal, no murmur, click, rub or gallop GI: Mild right upper quadrant tenderness Extremities: extremities normal, atraumatic, no cyanosis or edema  Lab Results:    Basic Metabolic Panel:  Basename 12/28/12 0442 12/27/12 2103  NA 141 141  K 3.1* 3.3*  CL 103 101  CO2 30 28  GLUCOSE 93 127*  BUN 19 20  CREATININE 1.31* 1.43*  CALCIUM 8.7 9.4  MG -- --  PHOS -- --   Liver Function Tests:  [facility] 12/28/12 0442 12/27/12 2103  AST 11 14  ALT 11 13  ALKPHOS 106 116  BILITOT 0.4 0.5  PROT 6.6 7.7  ALBUMIN 2.4* 2.9*    Basename 12/28/12 0015  LIPASE 54  AMYLASE --   No results found for this basename: AMMONIA:2 in the last 72 hours CBC:  Basename 12/28/12 0442 12/27/12 2103  WBC 9.3 11.2*  NEUTROABS -- 6.6  HGB 11.4* 13.2  HCT 36.4 40.9  MCV 85.0 84.5  PLT 233 268   Cardiac Enzymes:  Basename 12/27/12 2103  CKTOTAL --  CKMB --  CKMBINDEX --  TROPONINI <0.30   BNP: No results found for this basename: PROBNP:3 in the last 72 hours D-Dimer: No results found for this basename: DDIMER:2 in the last 72 hours CBG: No results found for this basename: GLUCAP:6 in the last 72 hours Hemoglobin A1C: No results found for this basename: HGBA1C in the last 72 hours Fasting  Lipid Panel: No results found for this basename: CHOL,HDL,LDLCALC,TRIG,CHOLHDL,LDLDIRECT in the last 72 hours Thyroid Function Tests: No results found for this basename: TSH,T4TOTAL,FREET4,T3FREE,THYROIDAB in the last 72 hours Anemia Panel: No results found for this basename: VITAMINB12,FOLATE,FERRITIN,TIBC,IRON,RETICCTPCT in the last 72 hours Coagulation: No results found for this basename: LABPROT:2,INR:2 in the last 72 hours Urine Drug Screen: Drugs of Abuse  No results found for this basename: labopia, cocainscrnur, labbenz, amphetmu, thcu, labbarb    Alcohol Level: No results found for this basename: ETH:2 in the last 72 hours Urinalysis:  Basename 12/27/12 2203  COLORURINE YELLOW  LABSPEC 1.020  PHURINE 5.5  GLUCOSEU NEGATIVE  HGBUR NEGATIVE  BILIRUBINUR NEGATIVE  KETONESUR NEGATIVE  PROTEINUR NEGATIVE  UROBILINOGEN 2.0*  NITRITE NEGATIVE  LEUKOCYTESUR NEGATIVE   Misc. Labs:  ABGS No results found for this basename: PHART,PCO2,PO2ART,TCO2,HCO3 in the last 72 hours CULTURES Recent Results (from the past 240 hour(s))  MRSA PCR SCREENING     Status: Abnormal   Collection Time   12/28/12  8:48 AM      Component Value Range Status Comment   MRSA by PCR POSITIVE (*) NEGATIVE Final    Studies/Results: Ct Abdomen Pelvis Wo Contrast  12/28/2012  *RADIOLOGY REPORT*  Clinical Data: Right upper quadrant pain.  Failure to thrive.  CT ABDOMEN AND PELVIS WITHOUT CONTRAST  Technique:  Multidetector CT imaging of the  abdomen and pelvis was performed following the standard protocol without intravenous contrast.  Comparison: CT abdomen and pelvis 08/04/2012.  Findings: Lung bases demonstrate some basilar atelectasis.  No pleural or pericardial effusion.  Again seen is an infrarenal abdominal aortic aneurysm measuring 5.1 x 5.2 cm in the axial plane.  There is no hemorrhage.  Extensive atherosclerotic vascular disease is identified.  The adrenal glands, spleen, liver and right kidney are  unremarkable.  A few small gallstones without evidence of cholecystitis are noted.  A single small parenchymal calcification in the left kidney is unchanged.  Uterus, adnexa and urinary bladder are unremarkable.  The stomach, small and large bowel and appendix appear normal.  Marked flattening of the femoral heads bilaterally, worse on the left is unchanged.  IMPRESSION:  1.  No acute finding. 2.  No change in an infrarenal abdominal aortic aneurysm.  No hemorrhage. 3.  No change in a nonobstructing stone lower pole left kidney. 4.  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 5.  Small gallstones without evidence of cholecystitis.   Original Report Authenticated By: Holley Dexter, M.D.    Dg Chest 2 View  12/27/2012  *RADIOLOGY REPORT*  Clinical Data: Weakness.  Altered mental status.  CHEST - 2 VIEW  Comparison: Plain film chest 08/31/2012 and 05/23/2012.  CT chest 08/17/2012.  Findings: Lungs are clear.  Heart size upper normal.  No pneumothorax or pleural fluid.  IMPRESSION: No acute disease.   Original Report Authenticated By: Holley Dexter, M.D.    US Abdomen Complete  12/28/2012  *RADIOLOGY REPORT*  Clinical Data:  Abdominal pain, cholelithiasis, AAA.  COMPLETE ABDOMINAL ULTRASOUND  Comparison:  CT 12/28/2012  Findings:  Gallbladder:  Small layering stones within the gallbladder. Gallbladder wall within normal limits in thickness at 2.7 mm. Negative sonographic Murphy's. Sludge also noted within the gallbladder.  Common bile duct:   Normal caliber, 7 mm.  Liver:  No focal lesion identified.  Within normal limits in parenchymal echogenicity.  IVC:  Appears normal.  Pancreas:  No focal abnormality seen.  Spleen:  Within normal limits in size and echotexture.  Right Kidney:   1.4 cm cyst in the upper pole.  No hydronephrosis. Normal echotexture and size.  Left Kidney:  Normal in size and parenchymal echogenicity.  No evidence of  mass or hydronephrosis.  Abdominal aorta:  Abdominal aortic aneurysm noted.  This measures maximally 5.4 cm.  Extensive mural plaque.  IMPRESSION: Cholelithiasis.  Gallbladder sludge.  No sonographic evidence of acute cholecystitis.  5.4 cm focal abdominal aortic aneurysm with extensive mural plaque.   Original Report Authenticated By: Charlett Nose, M.D.    US Venous Img Lower Bilateral  12/28/2012  *RADIOLOGY REPORT*  Clinical Data: Bilateral lower extremity swelling, pain and edema, history of CHF, evaluate for DVT  BILATERAL LOWER EXTREMITY VENOUS DUPLEX ULTRASOUND  Technique:  Gray-scale sonography with graded compression, as well as color Doppler and duplex ultrasound, were performed to evaluate the deep venous system of both lower extremities from the level of the common femoral vein through the popliteal and proximal calf veins.  Spectral Doppler was utilized to evaluate flow at rest and with distal augmentation maneuvers.  Comparison:  None.  Findings:  Normal compressibility of bilateral common femoral, superficial femoral, and popliteal veins is demonstrated, as well as the visualized proximal calf veins.  No filling defects to suggest DVT on grayscale or color Doppler imaging.  Doppler waveforms show normal direction of  venous flow, normal respiratory phasicity and response to augmentation.  IMPRESSION: No evidence of deep vein thrombosis within either lower extremity.   Original Report Authenticated By: Tacey Ruiz, MD     Medications:  Prior to Admission:  Prescriptions prior to admission  Medication Sig Dispense Refill  . acetaminophen (Q-PAP) 500 MG tablet Take 500 mg by mouth every 4 (four) hours as needed. For pain      . albuterol (PROVENTIL) (2.5 MG/3ML) 0.083% nebulizer solution Take 2.5 mg by nebulization every 6 (six) hours as needed. For shortness of breath      . ALPRAZolam (XANAX) 0.5 MG tablet Take 0.5 mg by mouth every 8 (eight) hours as needed.      . Choline Fenofibrate  (FENOFIBRIC ACID) 135 MG CPDR Take 1 tablet by mouth daily.      Marland Kitchen diltiazem (TIAZAC) 240 MG 24 hr capsule Take 240 mg by mouth daily.      . divalproex (DEPAKOTE SPRINKLE) 125 MG capsule Take 125-250 mg by mouth 2 (two) times daily. Take two capsules in the morning and take one capsule at bedtime      . furosemide (LASIX) 40 MG tablet Take 40 mg by mouth daily.      Marland Kitchen HYDROcodone-acetaminophen (NORCO) 10-325 MG per tablet Take 1 tablet by mouth 3 (three) times daily as needed.      . nebivolol (BYSTOLIC) 5 MG tablet Take 5 mg by mouth daily.      Marland Kitchen omeprazole (PRILOSEC) 20 MG capsule Take 20 mg by mouth daily.      . rosuvastatin (CRESTOR) 10 MG tablet Take 10 mg by mouth at bedtime.      Marland Kitchen tiotropium (SPIRIVA) 18 MCG inhalation capsule Place 18 mcg into inhaler and inhale daily.      . traMADol (ULTRAM) 50 MG tablet Take 50 mg by mouth every 6 (six) hours as needed.      . traZODone (DESYREL) 50 MG tablet Take 25 mg by mouth at bedtime as needed. For sleep       Scheduled:   . albuterol  2.5 mg Nebulization Q6H  . atorvastatin  20 mg Oral q1800  . Chlorhexidine Gluconate Cloth  6 each Topical Q0600  . divalproex  250 mg Oral Daily   And  . divalproex  125 mg Oral QHS  . heparin  5,000 Units Subcutaneous Q8H  . mupirocin ointment  1 application Nasal BID  . pantoprazole  40 mg Oral Daily  . sodium chloride  3 mL Intravenous Q12H  . tiotropium  18 mcg Inhalation Daily   Continuous:   . 0.9 % NaCl with KCl 20 mEq / L 75 mL/hr at 12/29/12 8119   JYN:[id number], acetaminophen, albuterol, ALPRAZolam, ondansetron (ZOFRAN) IV, ondansetron, traZODone  Assesment: She has right upper quadrant abdominal pain. She has gallstones. She has multiple other medical problems which complicate her care Principal Problem:  *RUQ abdominal pain Active Problems:  AAA (abdominal aortic aneurysm)  Failure to thrive in adult  Dehydration  Hypokalemia    Plan: She will have surgical  consultation.    LOS: 2 days   Erleen Egner L 12/29/2012, 9:08 AM

## 2012-12-29 NOTE — Progress Notes (Signed)
UR chart review completed.  

## 2012-12-30 DIAGNOSIS — J449 Chronic obstructive pulmonary disease, unspecified: Secondary | ICD-10-CM | POA: Diagnosis not present

## 2012-12-30 DIAGNOSIS — R109 Unspecified abdominal pain: Secondary | ICD-10-CM | POA: Diagnosis not present

## 2012-12-30 LAB — CBC WITH DIFFERENTIAL/PLATELET
Basophils Relative: 0 % (ref 0–1)
Eosinophils Relative: 1 % (ref 0–5)
HCT: 38.7 % (ref 36.0–46.0)
Hemoglobin: 12.4 g/dL (ref 12.0–15.0)
Lymphocytes Relative: 29 % (ref 12–46)
MCHC: 32 g/dL (ref 30.0–36.0)
MCV: 84.3 fL (ref 78.0–100.0)
Monocytes Absolute: 1 10*3/uL (ref 0.1–1.0)
Monocytes Relative: 13 % — ABNORMAL HIGH (ref 3–12)
Neutro Abs: 4.4 10*3/uL (ref 1.7–7.7)
RDW: 17 % — ABNORMAL HIGH (ref 11.5–15.5)

## 2012-12-30 MED ORDER — HYDROCOD POLST-CHLORPHEN POLST 10-8 MG/5ML PO LQCR
5.0000 mL | Freq: Two times a day (BID) | ORAL | Status: DC
  Administered 2012-12-30: 5 mL via ORAL
  Filled 2012-12-30: qty 5

## 2012-12-30 MED ORDER — ALBUTEROL SULFATE (5 MG/ML) 0.5% IN NEBU: 2.5000 mg | INHALATION_SOLUTION | Freq: Two times a day (BID) | RESPIRATORY_TRACT | Status: DC

## 2012-12-30 NOTE — Progress Notes (Signed)
12/30/12 1826 Patient discharged back to [facility] this evening. Discharge instructions sent with [facility] transporter, verbalized understanding of instructions. IV site d/c'd this afternoon, within normal limits. Pt c/o some back discomfort but did not want tylenol this evening, stated preferred to wait until back to [facility] and take pain pill there. Pt left floor in stable condition via w/c accompanied by nurse tech. Earnstine Regal, RN

## 2012-12-30 NOTE — Clinical Social Work Note (Addendum)
Pt d/c today back to [facility]. Pt, daughter, and facility aware and agreeable. Pt to have home health PT/RN. FL2 faxed to facility. [facility] to provide transport. Facility agreeable to d/c tomorrow and CSW will fax when complete.   Derenda Fennel, [place] [phone]

## 2012-12-30 NOTE — Progress Notes (Signed)
Subjective: She feels okay. She says she's not having any abdominal pain or nausea.  Objective: Vital signs in last 24 hours: Temp:  [97.3 F (36.3 C)-98.4 F (36.9 C)] 98.4 F (36.9 C) (01/09 0655) Pulse Rate:  [68-74] 68  (01/09 0655) Resp:  [18-20] 20  (01/09 0655) BP: (133-162)/(70-83) 162/70 mmHg (01/09 0655) SpO2:  [92 %-98 %] 98 % (01/09 0655) Weight change:  Last BM Date: 12/28/12  Intake/Output from previous day: 01/08 0701 - 01/09 0700 In: 1575.5 [P.O.:840; I.V.:735.5] Out: 777 [Urine:775; Stool:2]  PHYSICAL EXAM General appearance: alert, cooperative and no distress Resp: clear to auscultation bilaterally Cardio: regular rate and rhythm, S1, S2 normal, no murmur, click, rub or gallop GI: soft, non-tender; bowel sounds normal; no masses,  no organomegaly Extremities: extremities normal, atraumatic, no cyanosis or edema  Lab Results:    Basic Metabolic Panel:  Basename 12/28/12 0442 12/27/12 2103  NA 141 141  K 3.1* 3.3*  CL 103 101  CO2 30 28  GLUCOSE 93 127*  BUN 19 20  CREATININE 1.31* 1.43*  CALCIUM 8.7 9.4  MG -- --  PHOS -- --   Liver Function Tests:  [facility] 12/28/12 0442 12/27/12 2103  AST 11 14  ALT 11 13  ALKPHOS 106 116  BILITOT 0.4 0.5  PROT 6.6 7.7  ALBUMIN 2.4* 2.9*    Basename 12/28/12 0015  LIPASE 54  AMYLASE --   No results found for this basename: AMMONIA:2 in the last 72 hours CBC:  Basename 12/28/12 0442 12/27/12 2103  WBC 9.3 11.2*  NEUTROABS -- 6.6  HGB 11.4* 13.2  HCT 36.4 40.9  MCV 85.0 84.5  PLT 233 268   Cardiac Enzymes:  Basename 12/27/12 2103  CKTOTAL --  CKMB --  CKMBINDEX --  TROPONINI <0.30   BNP: No results found for this basename: PROBNP:3 in the last 72 hours D-Dimer: No results found for this basename: DDIMER:2 in the last 72 hours CBG: No results found for this basename: GLUCAP:6 in the last 72 hours Hemoglobin A1C: No results found for this basename: HGBA1C in the last 72  hours Fasting Lipid Panel: No results found for this basename: CHOL,HDL,LDLCALC,TRIG,CHOLHDL,LDLDIRECT in the last 72 hours Thyroid Function Tests: No results found for this basename: TSH,T4TOTAL,FREET4,T3FREE,THYROIDAB in the last 72 hours Anemia Panel: No results found for this basename: VITAMINB12,FOLATE,FERRITIN,TIBC,IRON,RETICCTPCT in the last 72 hours Coagulation: No results found for this basename: LABPROT:2,INR:2 in the last 72 hours Urine Drug Screen: Drugs of Abuse  No results found for this basename: labopia, cocainscrnur, labbenz, amphetmu, thcu, labbarb    Alcohol Level: No results found for this basename: ETH:2 in the last 72 hours Urinalysis:  Basename 12/27/12 2203  COLORURINE YELLOW  LABSPEC 1.020  PHURINE 5.5  GLUCOSEU NEGATIVE  HGBUR NEGATIVE  BILIRUBINUR NEGATIVE  KETONESUR NEGATIVE  PROTEINUR NEGATIVE  UROBILINOGEN 2.0*  NITRITE NEGATIVE  LEUKOCYTESUR NEGATIVE   Misc. Labs:  ABGS No results found for this basename: PHART,PCO2,PO2ART,TCO2,HCO3 in the last 72 hours CULTURES Recent Results (from the past 240 hour(s))  MRSA PCR SCREENING     Status: Abnormal   Collection Time   12/28/12  8:48 AM      Component Value Range Status Comment   MRSA by PCR POSITIVE (*) NEGATIVE Final    Studies/Results: US Abdomen Complete  12/28/2012  *RADIOLOGY REPORT*  Clinical Data:  Abdominal pain, cholelithiasis, AAA.  COMPLETE ABDOMINAL ULTRASOUND  Comparison:  CT 12/28/2012  Findings:  Gallbladder:  Small layering stones within the gallbladder. Gallbladder  wall within normal limits in thickness at 2.7 mm. Negative sonographic Murphy's. Sludge also noted within the gallbladder.  Common bile duct:   Normal caliber, 7 mm.  Liver:  No focal lesion identified.  Within normal limits in parenchymal echogenicity.  IVC:  Appears normal.  Pancreas:  No focal abnormality seen.  Spleen:  Within normal limits in size and echotexture.  Right Kidney:   1.4 cm cyst in the upper pole.  No  hydronephrosis. Normal echotexture and size.  Left Kidney:  Normal in size and parenchymal echogenicity.  No evidence of mass or hydronephrosis.  Abdominal aorta:  Abdominal aortic aneurysm noted.  This measures maximally 5.4 cm.  Extensive mural plaque.  IMPRESSION: Cholelithiasis.  Gallbladder sludge.  No sonographic evidence of acute cholecystitis.  5.4 cm focal abdominal aortic aneurysm with extensive mural plaque.   Original Report Authenticated By: Charlett Nose, M.D.    US Venous Img Lower Bilateral  12/28/2012  *RADIOLOGY REPORT*  Clinical Data: Bilateral lower extremity swelling, pain and edema, history of CHF, evaluate for DVT  BILATERAL LOWER EXTREMITY VENOUS DUPLEX ULTRASOUND  Technique:  Gray-scale sonography with graded compression, as well as color Doppler and duplex ultrasound, were performed to evaluate the deep venous system of both lower extremities from the level of the common femoral vein through the popliteal and proximal calf veins.  Spectral Doppler was utilized to evaluate flow at rest and with distal augmentation maneuvers.  Comparison:  None.  Findings:  Normal compressibility of bilateral common femoral, superficial femoral, and popliteal veins is demonstrated, as well as the visualized proximal calf veins.  No filling defects to suggest DVT on grayscale or color Doppler imaging.  Doppler waveforms show normal direction of venous flow, normal respiratory phasicity and response to augmentation.  IMPRESSION: No evidence of deep vein thrombosis within either lower extremity.   Original Report Authenticated By: Tacey Ruiz, MD     Medications:  Prior to Admission:  Prescriptions prior to admission  Medication Sig Dispense Refill  . acetaminophen (Q-PAP) 500 MG tablet Take 500 mg by mouth every 4 (four) hours as needed. For pain      . albuterol (PROVENTIL) (2.5 MG/3ML) 0.083% nebulizer solution Take 2.5 mg by nebulization every 6 (six) hours as needed. For shortness of breath      .  ALPRAZolam (XANAX) 0.5 MG tablet Take 0.5 mg by mouth every 8 (eight) hours as needed.      . Choline Fenofibrate (FENOFIBRIC ACID) 135 MG CPDR Take 1 tablet by mouth daily.      Marland Kitchen diltiazem (TIAZAC) 240 MG 24 hr capsule Take 240 mg by mouth daily.      . divalproex (DEPAKOTE SPRINKLE) 125 MG capsule Take 125-250 mg by mouth 2 (two) times daily. Take two capsules in the morning and take one capsule at bedtime      . furosemide (LASIX) 40 MG tablet Take 40 mg by mouth daily.      Marland Kitchen HYDROcodone-acetaminophen (NORCO) 10-325 MG per tablet Take 1 tablet by mouth 3 (three) times daily as needed.      . nebivolol (BYSTOLIC) 5 MG tablet Take 5 mg by mouth daily.      Marland Kitchen omeprazole (PRILOSEC) 20 MG capsule Take 20 mg by mouth daily.      . rosuvastatin (CRESTOR) 10 MG tablet Take 10 mg by mouth at bedtime.      Marland Kitchen tiotropium (SPIRIVA) 18 MCG inhalation capsule Place 18 mcg into inhaler and inhale daily.      Marland Kitchen  traMADol (ULTRAM) 50 MG tablet Take 50 mg by mouth every 6 (six) hours as needed.      . traZODone (DESYREL) 50 MG tablet Take 25 mg by mouth at bedtime as needed. For sleep       Scheduled:   . albuterol  2.5 mg Nebulization QID  . atorvastatin  20 mg Oral q1800  . Chlorhexidine Gluconate Cloth  6 each Topical Q0600  . divalproex  250 mg Oral Daily   And  . divalproex  125 mg Oral QHS  . heparin  5,000 Units Subcutaneous Q8H  . mupirocin ointment  1 application Nasal BID  . pantoprazole  40 mg Oral Daily  . sodium chloride  3 mL Intravenous Q12H  . tiotropium  18 mcg Inhalation Daily   Continuous:   . 0.9 % NaCl with KCl 20 mEq / [name] 75 mL/hr (12/29/12 2122)   JYN:[id number], acetaminophen, albuterol, ALPRAZolam, ondansetron (ZOFRAN) IV, ondansetron, traZODone  Assesment: She was admitted with right upper quadrant pain. She does have gallstones but she does not seem to have acute cholecystitis at least by the fact that she's been able to eat without increasing her pain. She has had some  right upper quadrant tenderness. Her sonogram does not show gallbladder wall thickening Principal Problem:  *RUQ abdominal pain Active Problems:  Hypertension  COPD (chronic obstructive pulmonary disease)  AAA (abdominal aortic aneurysm)  Arteriosclerotic cardiovascular disease (ASCVD)  Peripheral vascular disease  Failure to thrive in adult  Dehydration  Hypokalemia    Plan: If she continues to do okay with food today and I will plan to discharge her    LOS: 3 days   [name] [name] 12/30/2012, 8:24 AM

## 2012-12-30 NOTE — Progress Notes (Signed)
12/30/12 1742 Patient awaiting transport for discharge back to assisted living facility. SW reported [facility] to provide transport. Spoke with [facility] this evening at 1740 regarding transport, stated she was not aware of need to provide transport but will send someone as soon as possible. Pt in stable condition awaiting discharge. Earnstine Regal, RN

## 2012-12-30 NOTE — Progress Notes (Signed)
12/30/12 1410 Notified Dr Juanetta Gosling that patient tolerated regular diet well for breakfast and lunch this afternoon. Stated would work on her discharge orders later today. Earnstine Regal, RN

## 2012-12-30 NOTE — Discharge Summary (Signed)
Physician Discharge Summary  Patient ID: [name] [name] MRN: [id number] DOB/AGE: [birth year] 76 y.o. Primary Care Physician:Rubina Basinski L, MD Admit date: 12/27/2012 Discharge date: 12/30/2012    Discharge Diagnoses:   Principal Problem:  *RUQ abdominal pain Active Problems:  Hypertension  COPD (chronic obstructive pulmonary disease)  AAA (abdominal aortic aneurysm)  Arteriosclerotic cardiovascular disease (ASCVD)  Peripheral vascular disease  Failure to thrive in adult  Dehydration  Hypokalemia  aseptic necrosis of both hips    Medication List     As of 12/30/2012  6:53 PM    TAKE these medications         albuterol (2.5 MG/3ML) 0.083% nebulizer solution   Commonly known as: PROVENTIL   Take 2.5 mg by nebulization every 6 (six) hours as needed. For shortness of breath      ALPRAZolam 0.5 MG tablet   Commonly known as: XANAX   Take 0.5 mg by mouth every 8 (eight) hours as needed.      diltiazem 240 MG 24 hr capsule   Commonly known as: TIAZAC   Take 240 mg by mouth daily.      divalproex 125 MG capsule   Commonly known as: DEPAKOTE SPRINKLE   Take 125-250 mg by mouth 2 (two) times daily. Take two capsules in the morning and take one capsule at bedtime      Fenofibric Acid 135 MG Cpdr   Take 1 tablet by mouth daily.      furosemide 40 MG tablet   Commonly known as: LASIX   Take 40 mg by mouth daily.      HYDROcodone-acetaminophen 10-325 MG per tablet   Commonly known as: NORCO   Take 1 tablet by mouth 3 (three) times daily as needed.      nebivolol 5 MG tablet   Commonly known as: BYSTOLIC   Take 5 mg by mouth daily.      omeprazole 20 MG capsule   Commonly known as: PRILOSEC   Take 20 mg by mouth daily.      Q-PAP 500 MG tablet   Generic drug: acetaminophen   Take 500 mg by mouth every 4 (four) hours as needed. For pain      rosuvastatin 10 MG tablet   Commonly known as: CRESTOR   Take 10 mg by mouth at bedtime.      tiotropium 18 MCG inhalation  capsule   Commonly known as: SPIRIVA   Place 18 mcg into inhaler and inhale daily.      traMADol 50 MG tablet   Commonly known as: ULTRAM   Take 50 mg by mouth every 6 (six) hours as needed.      traZODone 50 MG tablet   Commonly known as: DESYREL   Take 25 mg by mouth at bedtime as needed. For sleep        Discharged Condition: Improved    Consults: Surgery  Significant Diagnostic Studies: Ct Abdomen Pelvis Wo Contrast  12/28/2012  *RADIOLOGY REPORT*  Clinical Data: Right upper quadrant pain.  Failure to thrive.  CT ABDOMEN AND PELVIS WITHOUT CONTRAST  Technique:  Multidetector CT imaging of the abdomen and pelvis was performed following the standard protocol without intravenous contrast.  Comparison: CT abdomen and pelvis 08/04/2012.  Findings: Lung bases demonstrate some basilar atelectasis.  No pleural or pericardial effusion.  Again seen is an infrarenal abdominal aortic aneurysm measuring 5.1 x 5.2 cm in the axial plane.  There is no hemorrhage.  Extensive atherosclerotic vascular disease is identified.  The adrenal glands, spleen, liver and right kidney are unremarkable.  A few small gallstones without evidence of cholecystitis are noted.  A single small parenchymal calcification in the left kidney is unchanged.  Uterus, adnexa and urinary bladder are unremarkable.  The stomach, small and large bowel and appendix appear normal.  Marked flattening of the femoral heads bilaterally, worse on the left is unchanged.  IMPRESSION:  1.  No acute finding. 2.  No change in an infrarenal abdominal aortic aneurysm.  No hemorrhage. 3.  No change in a nonobstructing stone lower pole left kidney. 4.  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 5.  Small gallstones without evidence of cholecystitis.   Original Report Authenticated By: Holley Dexter, M.D.    Dg Chest 2 View  12/27/2012  *RADIOLOGY REPORT*  Clinical Data:  Weakness.  Altered mental status.  CHEST - 2 VIEW  Comparison: Plain film chest 08/31/2012 and 05/23/2012.  CT chest 08/17/2012.  Findings: Lungs are clear.  Heart size upper normal.  No pneumothorax or pleural fluid.  IMPRESSION: No acute disease.   Original Report Authenticated By: Holley Dexter, M.D.    US Abdomen Complete  12/28/2012  *RADIOLOGY REPORT*  Clinical Data:  Abdominal pain, cholelithiasis, AAA.  COMPLETE ABDOMINAL ULTRASOUND  Comparison:  CT 12/28/2012  Findings:  Gallbladder:  Small layering stones within the gallbladder. Gallbladder wall within normal limits in thickness at 2.7 mm. Negative sonographic Murphy's. Sludge also noted within the gallbladder.  Common bile duct:   Normal caliber, 7 mm.  Liver:  No focal lesion identified.  Within normal limits in parenchymal echogenicity.  IVC:  Appears normal.  Pancreas:  No focal abnormality seen.  Spleen:  Within normal limits in size and echotexture.  Right Kidney:   1.4 cm cyst in the upper pole.  No hydronephrosis. Normal echotexture and size.  Left Kidney:  Normal in size and parenchymal echogenicity.  No evidence of mass or hydronephrosis.  Abdominal aorta:  Abdominal aortic aneurysm noted.  This measures maximally 5.4 cm.  Extensive mural plaque.  IMPRESSION: Cholelithiasis.  Gallbladder sludge.  No sonographic evidence of acute cholecystitis.  5.4 cm focal abdominal aortic aneurysm with extensive mural plaque.   Original Report Authenticated By: Charlett Nose, M.D.    US Venous Img Lower Bilateral  12/28/2012  *RADIOLOGY REPORT*  Clinical Data: Bilateral lower extremity swelling, pain and edema, history of CHF, evaluate for DVT  BILATERAL LOWER EXTREMITY VENOUS DUPLEX ULTRASOUND  Technique:  Gray-scale sonography with graded compression, as well as color Doppler and duplex ultrasound, were performed to evaluate the deep venous system of both lower extremities from the level of the common femoral vein through the popliteal and proximal calf  veins.  Spectral Doppler was utilized to evaluate flow at rest and with distal augmentation maneuvers.  Comparison:  None.  Findings:  Normal compressibility of bilateral common femoral, superficial femoral, and popliteal veins is demonstrated, as well as the visualized proximal calf veins.  No filling defects to suggest DVT on grayscale or color Doppler imaging.  Doppler waveforms show normal direction of venous flow, normal respiratory phasicity and response to augmentation.  IMPRESSION: No evidence of deep vein thrombosis within either lower extremity.   Original Report Authenticated By: Tacey Ruiz, MD     Lab Results: Basic Metabolic Panel:  Basename 12/28/12 0442 12/27/12 2103  NA 141 141  K 3.1* 3.3*  CL 103 101  CO2 30 28  GLUCOSE  93 127*  BUN 19 20  CREATININE 1.31* 1.43*  CALCIUM 8.7 9.4  MG -- --  PHOS -- --   Liver Function Tests:  [facility] 12/28/12 0442 12/27/12 2103  AST 11 14  ALT 11 13  ALKPHOS 106 116  BILITOT 0.4 0.5  PROT 6.6 7.7  ALBUMIN 2.4* 2.9*     CBC:  Basename 12/30/12 0916 12/28/12 0442 12/27/12 2103  WBC 7.7 9.3 --  NEUTROABS 4.4 -- 6.6  HGB 12.4 11.4* --  HCT 38.7 36.4 --  MCV 84.3 85.0 --  PLT 235 233 --    Recent Results (from the past 240 hour(s))  MRSA PCR SCREENING     Status: Abnormal   Collection Time   12/28/12  8:48 AM      Component Value Range Status Comment   MRSA by PCR POSITIVE (*) NEGATIVE Final      Hospital Course: She was admitted with right upper quadrant abdominal pain nausea and vomiting. She was evaluated in the emergency room with CT scan and was found to have stones in the gallbladder but no definite cholecystitis. She was treated with antibiotics IV fluids pain and nausea medications and improved. She had sonogram that also did not show sonographic evidence of acute cholecystitis. She had surgery consultation it was felt that she may not need surgery. She had cardiology consultation and she was cleared for surgery  if needed. She was able to eat a regular diet with no pain at all by the time she was discharged. She had low potassium which was replaced  Discharge Exam: Blood pressure 134/84, pulse 81, temperature 98.1 F (36.7 C), temperature source Oral, resp. rate 18, height 5\' 5"  (1.651 m), weight 72.576 kg (160 lb), SpO2 95.00%. She is awake and alert. Her abdomen is soft. Her chest is clear. Her heart is regular`  Disposition: Home with home health services she lives in an assisted living facility      Discharge Orders    Future Appointments: Provider: Department: Dept Phone: Center:   02/09/2013 9:00 AM [facility] [phone] [facility]   02/09/2013 9:45 AM Chuck Hint, MD [facility] [phone] [facility]     Future Orders Please Complete By Expires   Home Health      Questions: Responses:   To provide the following care/treatments PT    RN   Face-to-face encounter      Comments:   I Jsoeph Podesta L certify that this patient is under my care and that I, or a nurse practitioner or physician's assistant working with me, had a face-to-face encounter that meets the physician face-to-face encounter requirements with this patient on 12/30/2012. The encounter with the patient was in whole, or in part for the following medical condition(s) which is the primary reason for home health care (List medical condition):abdominal pain/COPD   Questions: Responses:   The encounter with the patient was in whole, or in part, for the following medical condition, which is the primary reason for home health care abdominal pain/copd   I certify that, based on my findings, the following services are medically necessary home health services Nursing    Physical therapy   My clinical findings support the need for the above services Unable to leave home safely without assistance and/or assistive device   Further, I certify that my clinical findings  support that this patient is homebound due to: Unable to leave home safely without assistance   To  provide the following care/treatments PT    RN   Discharge patient         Follow-up Information    Follow up with [facility].   Contact information:   [address] [place] [address] [phone]      Follow up with Rheagan Nayak L, MD. Schedule an appointment as soon as possible for a visit in 1 week.   Contact information:   [address] [place] [address] [phone]          Signed: Fredirick Maudlin Pager [phone]  12/30/2012, 6:53 PM

## 2012-12-31 NOTE — Progress Notes (Signed)
UR chart review completed.  

## 2013-01-11 DIAGNOSIS — M1990 Unspecified osteoarthritis, unspecified site: Secondary | ICD-10-CM | POA: Diagnosis not present

## 2013-01-11 DIAGNOSIS — M79609 Pain in unspecified limb: Secondary | ICD-10-CM | POA: Diagnosis not present

## 2013-01-11 DIAGNOSIS — J449 Chronic obstructive pulmonary disease, unspecified: Secondary | ICD-10-CM | POA: Diagnosis not present

## 2013-01-11 DIAGNOSIS — B351 Tinea unguium: Secondary | ICD-10-CM | POA: Diagnosis not present

## 2013-01-11 DIAGNOSIS — E785 Hyperlipidemia, unspecified: Secondary | ICD-10-CM | POA: Diagnosis not present

## 2013-01-11 DIAGNOSIS — I10 Essential (primary) hypertension: Secondary | ICD-10-CM | POA: Diagnosis not present

## 2013-01-13 DIAGNOSIS — I10 Essential (primary) hypertension: Secondary | ICD-10-CM | POA: Diagnosis not present

## 2013-01-13 DIAGNOSIS — M1990 Unspecified osteoarthritis, unspecified site: Secondary | ICD-10-CM | POA: Diagnosis not present

## 2013-01-13 DIAGNOSIS — E785 Hyperlipidemia, unspecified: Secondary | ICD-10-CM | POA: Diagnosis not present

## 2013-02-08 ENCOUNTER — Encounter: Payer: Self-pay | Admitting: Vascular Surgery

## 2013-02-09 ENCOUNTER — Ambulatory Visit (INDEPENDENT_AMBULATORY_CARE_PROVIDER_SITE_OTHER): Payer: Medicare Other | Admitting: Neurosurgery

## 2013-02-09 ENCOUNTER — Other Ambulatory Visit: Payer: Self-pay | Admitting: *Deleted

## 2013-02-09 ENCOUNTER — Encounter (INDEPENDENT_AMBULATORY_CARE_PROVIDER_SITE_OTHER): Payer: Medicare Other | Admitting: Vascular Surgery

## 2013-02-09 ENCOUNTER — Encounter: Payer: Self-pay | Admitting: Neurosurgery

## 2013-02-09 VITALS — BP 140/83 | HR 61 | Resp 20 | Ht 60.0 in | Wt 160.0 lb

## 2013-02-09 DIAGNOSIS — I714 Abdominal aortic aneurysm, without rupture: Secondary | ICD-10-CM | POA: Diagnosis not present

## 2013-02-09 NOTE — Progress Notes (Signed)
[facility] AAA/Carotid Office Note  CC: AAA surveillance Referring Physician: Edilia Bo  History of Present Illness: 78 year old female patient of Dr. Edilia Bo followed for known AAA. The patient denies any unusual abdominal or back pain. The patient has multiple comorbidities which makes elective repair extremely risky. Per Dr. Adele Dan last dictation we will continue to follow this conservatively until the patient is over 5.5 cm  Past Medical History  Diagnosis Date  . Hypertension   . Hyperlipidemia   . COPD (chronic obstructive pulmonary disease)     multiple hospitalizations for exacerbations with acute bronchitis  . Degenerative joint disease     Bilateral hips  . Depression with anxiety   . GERD (gastroesophageal reflux disease)     Hiatal hernia  . Atrial arrhythmia     Atrial fibrillation and atrial flutter diagnosed in the past;?  PSVT  . AAA (abdominal aortic aneurysm)     infrarenal; 07/2012: Diameter of 5.4 cm  . Arteriosclerotic cardiovascular disease (ASCVD)     Cardiac cath in 03/2000->  80% LAD, 70-80% RCA;  stent x1 in the LAD and x2 in the RCA with excellent angiographic outcome; repeat catheterization in late 2001 and 2003 revealed no restenosis; EF-50%  . Tobacco abuse, in remission     Quit in 2006; total consumption of 50-75 pack years  . Pituitary microadenoma     transsphenoidal resection in 09/2007  . Nephrolithiasis   . Anemia   . Coronary artery disease     ROS: [x]  Positive   [ ]  Denies    General: [ ]  Weight loss, [ ]  Fever, [ ]  chills Neurologic: [ ]  Dizziness, [ ]  Blackouts, [ ]  Seizure [ ]  Stroke, [ ]  "Mini stroke", [ ]  Slurred speech, [ ]  Temporary blindness; [ ]  weakness in arms or legs, [ ]  Hoarseness Cardiac: [ ]  Chest pain/pressure, [ ]  Shortness of breath at rest [ ]  Shortness of breath with exertion, [ ]  Atrial fibrillation or irregular heartbeat Vascular: [ ]  Pain in legs with walking, [ ]  Pain in legs at  rest, [ ]  Pain in legs at night,  [ ]  Non-healing ulcer, [ ]  Blood clot in vein/DVT,   Pulmonary: [ ]  Home oxygen, [ ]  Productive cough, [ ]  Coughing up blood, [ ]  Asthma,  [ ]  Wheezing Musculoskeletal:  [ ]  Arthritis, [ ]  Low back pain, [ ]  Joint pain Hematologic: [ ]  Easy Bruising, [ ]  Anemia; [ ]  Hepatitis Gastrointestinal: [ ]  Blood in stool, [ ]  Gastroesophageal Reflux/heartburn, [ ]  Trouble swallowing Urinary: [ ]  chronic Kidney disease, [ ]  on HD - [ ]  MWF or [ ]  TTHS, [ ]  Burning with urination, [ ]  Difficulty urinating Skin: [ ]  Rashes, [ ]  Wounds Psychological: [ ]  Anxiety, [ ]  Depression   Social History History  Substance Use Topics  . Smoking status: Former Smoker -- 1.50 packs/day for 50 years    Types: Cigarettes    Quit date: 05/27/2005  . Smokeless tobacco: Former Neurosurgeon    Quit date: 01/11/2008  . Alcohol Use: No    Family History Family History  Problem Relation Age of Onset  . Heart disease Mother   . Hyperlipidemia Mother   . Hypertension Mother   . Stroke Mother   . Cancer Brother     Allergies  Allergen Reactions  . Asa (Aspirin) Other (See Comments)    "my ears bleed"  . Lidocaine Other (See Comments)    unknown  .  Nsaids Other (See Comments)    unknown  . Sulfonamide Derivatives Other (See Comments)    unknown    Current Outpatient Prescriptions  Medication Sig Dispense Refill  . acetaminophen (Q-PAP) 500 MG tablet Take 500 mg by mouth every 4 (four) hours as needed. For pain      . albuterol (PROVENTIL) (2.5 MG/3ML) 0.083% nebulizer solution Take 2.5 mg by nebulization every 6 (six) hours as needed. For shortness of breath      . ALPRAZolam (XANAX) 0.5 MG tablet Take 0.5 mg by mouth every 8 (eight) hours as needed.      . Choline Fenofibrate (FENOFIBRIC ACID) 135 MG CPDR Take 1 tablet by mouth daily.      Marland Kitchen diltiazem (TIAZAC) 240 MG 24 hr capsule Take 240 mg by mouth daily.      . divalproex (DEPAKOTE SPRINKLE) 125 MG capsule Take 125-250 mg  by mouth 2 (two) times daily. Take two capsules in the morning and take one capsule at bedtime      . furosemide (LASIX) 40 MG tablet Take 40 mg by mouth daily.      Marland Kitchen HYDROcodone-acetaminophen (NORCO) 10-325 MG per tablet Take 1 tablet by mouth 3 (three) times daily as needed.      . nebivolol (BYSTOLIC) 5 MG tablet Take 5 mg by mouth daily.      Marland Kitchen omeprazole (PRILOSEC) 20 MG capsule Take 20 mg by mouth daily.      . rosuvastatin (CRESTOR) 10 MG tablet Take 10 mg by mouth at bedtime.      Marland Kitchen tiotropium (SPIRIVA) 18 MCG inhalation capsule Place 18 mcg into inhaler and inhale daily.      . traMADol (ULTRAM) 50 MG tablet Take 50 mg by mouth every 6 (six) hours as needed.      . traZODone (DESYREL) 50 MG tablet Take 25 mg by mouth at bedtime as needed. For sleep       No current facility-administered medications for this visit.    Physical Examination  Filed Vitals:   02/09/13 1049  BP: 140/83  Pulse: 61  Resp: 20    Body mass index is 31.25 kg/(m^2).  General:  WDWN in NAD Gait: Normal HEENT: WNL Eyes: Pupils equal Pulmonary: normal non-labored breathing , without Rales, rhonchi,  wheezing Cardiac: RRR, without  Murmurs, rubs or gallops; Abdomen: soft, NT, no masses Skin: no rashes, ulcers noted  Vascular Exam Pulses: Palpable femoral pulses bilaterally, there is no pulsatile abdominal mass noted due to girth Carotid bruits: Carotid pulses to auscultation Extremities without ischemic changes, no Gangrene , no cellulitis; no open wounds;  Musculoskeletal: no muscle wasting or atrophy   Neurologic: A&O X 3; Appropriate Affect ; SENSATION: normal; MOTOR FUNCTION:  moving all extremities equally. Speech is fluent/normal  Non-Invasive Vascular Imaging AAA duplex shows a maximum diameter of 5.2 AP by 5.3 transverse which is slightly decreased from previous exam November 2012  ASSESSMENT/PLAN: Asymptomatic patient will followup in 6 months with repeat AAA duplex. The patient's  questions were encouraged and answered, she is in agreement with this plan.  [name] ANP   Clinic MD: Edilia Bo

## 2013-03-22 ENCOUNTER — Encounter: Payer: Self-pay | Admitting: *Deleted

## 2013-03-29 DIAGNOSIS — M79609 Pain in unspecified limb: Secondary | ICD-10-CM | POA: Diagnosis not present

## 2013-03-29 DIAGNOSIS — B351 Tinea unguium: Secondary | ICD-10-CM | POA: Diagnosis not present

## 2013-04-12 DIAGNOSIS — J449 Chronic obstructive pulmonary disease, unspecified: Secondary | ICD-10-CM | POA: Diagnosis not present

## 2013-04-12 DIAGNOSIS — I259 Chronic ischemic heart disease, unspecified: Secondary | ICD-10-CM | POA: Diagnosis not present

## 2013-04-12 DIAGNOSIS — N390 Urinary tract infection, site not specified: Secondary | ICD-10-CM | POA: Diagnosis not present

## 2013-04-12 DIAGNOSIS — I509 Heart failure, unspecified: Secondary | ICD-10-CM | POA: Diagnosis not present

## 2013-04-12 DIAGNOSIS — I10 Essential (primary) hypertension: Secondary | ICD-10-CM | POA: Diagnosis not present

## 2013-05-12 DIAGNOSIS — F4325 Adjustment disorder with mixed disturbance of emotions and conduct: Secondary | ICD-10-CM | POA: Diagnosis not present

## 2013-05-20 ENCOUNTER — Inpatient Hospital Stay (HOSPITAL_COMMUNITY)
Admission: EM | Admit: 2013-05-20 | Discharge: 2013-05-31 | DRG: 342 | Disposition: A | Discharge status: Home Health | Payer: Medicare Other | Attending: Pulmonary Disease | Admitting: Pulmonary Disease

## 2013-05-20 ENCOUNTER — Emergency Department (HOSPITAL_COMMUNITY): Payer: Medicare Other

## 2013-05-20 ENCOUNTER — Encounter (HOSPITAL_COMMUNITY): Payer: Self-pay | Admitting: *Deleted

## 2013-05-20 DIAGNOSIS — I129 Hypertensive chronic kidney disease with stage 1 through stage 4 chronic kidney disease, or unspecified chronic kidney disease: Secondary | ICD-10-CM | POA: Diagnosis present

## 2013-05-20 DIAGNOSIS — I714 Abdominal aortic aneurysm, without rupture, unspecified: Secondary | ICD-10-CM | POA: Diagnosis present

## 2013-05-20 DIAGNOSIS — N390 Urinary tract infection, site not specified: Secondary | ICD-10-CM | POA: Diagnosis present

## 2013-05-20 DIAGNOSIS — N289 Disorder of kidney and ureter, unspecified: Secondary | ICD-10-CM | POA: Diagnosis present

## 2013-05-20 DIAGNOSIS — K3580 Unspecified acute appendicitis: Principal | ICD-10-CM | POA: Diagnosis present

## 2013-05-20 DIAGNOSIS — I4891 Unspecified atrial fibrillation: Secondary | ICD-10-CM | POA: Diagnosis present

## 2013-05-20 DIAGNOSIS — K219 Gastro-esophageal reflux disease without esophagitis: Secondary | ICD-10-CM | POA: Diagnosis present

## 2013-05-20 DIAGNOSIS — K37 Unspecified appendicitis: Secondary | ICD-10-CM | POA: Diagnosis not present

## 2013-05-20 DIAGNOSIS — J984 Other disorders of lung: Secondary | ICD-10-CM | POA: Diagnosis not present

## 2013-05-20 DIAGNOSIS — E785 Hyperlipidemia, unspecified: Secondary | ICD-10-CM | POA: Diagnosis present

## 2013-05-20 DIAGNOSIS — F341 Dysthymic disorder: Secondary | ICD-10-CM | POA: Diagnosis present

## 2013-05-20 DIAGNOSIS — K828 Other specified diseases of gallbladder: Secondary | ICD-10-CM | POA: Diagnosis not present

## 2013-05-20 DIAGNOSIS — M1610 Unilateral primary osteoarthritis, unspecified hip: Secondary | ICD-10-CM | POA: Diagnosis present

## 2013-05-20 DIAGNOSIS — Z87891 Personal history of nicotine dependence: Secondary | ICD-10-CM

## 2013-05-20 DIAGNOSIS — R112 Nausea with vomiting, unspecified: Secondary | ICD-10-CM | POA: Diagnosis not present

## 2013-05-20 DIAGNOSIS — R001 Bradycardia, unspecified: Secondary | ICD-10-CM | POA: Diagnosis not present

## 2013-05-20 DIAGNOSIS — I2510 Atherosclerotic heart disease of native coronary artery without angina pectoris: Secondary | ICD-10-CM | POA: Diagnosis present

## 2013-05-20 DIAGNOSIS — J449 Chronic obstructive pulmonary disease, unspecified: Secondary | ICD-10-CM | POA: Diagnosis present

## 2013-05-20 DIAGNOSIS — Z79899 Other long term (current) drug therapy: Secondary | ICD-10-CM

## 2013-05-20 DIAGNOSIS — I10 Essential (primary) hypertension: Secondary | ICD-10-CM

## 2013-05-20 DIAGNOSIS — J4489 Other specified chronic obstructive pulmonary disease: Secondary | ICD-10-CM

## 2013-05-20 DIAGNOSIS — N183 Chronic kidney disease, stage 3 unspecified: Secondary | ICD-10-CM | POA: Diagnosis present

## 2013-05-20 DIAGNOSIS — I498 Other specified cardiac arrhythmias: Secondary | ICD-10-CM | POA: Diagnosis not present

## 2013-05-20 DIAGNOSIS — R109 Unspecified abdominal pain: Secondary | ICD-10-CM | POA: Diagnosis present

## 2013-05-20 DIAGNOSIS — M169 Osteoarthritis of hip, unspecified: Secondary | ICD-10-CM | POA: Diagnosis present

## 2013-05-20 DIAGNOSIS — R1110 Vomiting, unspecified: Secondary | ICD-10-CM | POA: Diagnosis not present

## 2013-05-20 DIAGNOSIS — I4892 Unspecified atrial flutter: Secondary | ICD-10-CM | POA: Diagnosis present

## 2013-05-20 DIAGNOSIS — R1013 Epigastric pain: Secondary | ICD-10-CM | POA: Diagnosis not present

## 2013-05-20 DIAGNOSIS — N1831 Chronic kidney disease, stage 3a: Secondary | ICD-10-CM | POA: Diagnosis present

## 2013-05-20 DIAGNOSIS — K811 Chronic cholecystitis: Secondary | ICD-10-CM | POA: Diagnosis not present

## 2013-05-20 HISTORY — DX: Unspecified kidney failure: N19

## 2013-05-20 HISTORY — DX: Encephalopathy, unspecified: G93.40

## 2013-05-20 HISTORY — DX: Calculus of gallbladder without cholecystitis without obstruction: K80.20

## 2013-05-20 HISTORY — DX: Hypokalemia: E87.6

## 2013-05-20 HISTORY — DX: Ileus, unspecified: K56.7

## 2013-05-20 HISTORY — DX: Unspecified intestinal obstruction, unspecified as to partial versus complete obstruction: K56.609

## 2013-05-20 HISTORY — DX: Unspecified osteoarthritis, unspecified site: M19.90

## 2013-05-20 HISTORY — DX: Hyperosmolality and hypernatremia: E87.0

## 2013-05-20 LAB — URINALYSIS, ROUTINE W REFLEX MICROSCOPIC
Bilirubin Urine: NEGATIVE
Nitrite: NEGATIVE
Specific Gravity, Urine: 1.01 (ref 1.005–1.030)
Urobilinogen, UA: 2 mg/dL — ABNORMAL HIGH (ref 0.0–1.0)

## 2013-05-20 LAB — COMPREHENSIVE METABOLIC PANEL
ALT: 30 U/L (ref 0–35)
Alkaline Phosphatase: 122 U/L — ABNORMAL HIGH (ref 39–117)
BUN: 27 mg/dL — ABNORMAL HIGH (ref 6–23)
Chloride: 105 mEq/L (ref 96–112)
GFR calc Af Amer: 41 mL/min — ABNORMAL LOW (ref >90)
Glucose, Bld: 99 mg/dL (ref 70–99)
Potassium: 3.6 mEq/L (ref 3.5–5.1)
Total Bilirubin: 0.6 mg/dL (ref 0.3–1.2)

## 2013-05-20 LAB — CBC WITH DIFFERENTIAL/PLATELET
Hemoglobin: 12.8 g/dL (ref 12.0–15.0)
Lymphocytes Relative: 26 % (ref 12–46)
Lymphs Abs: 2.6 10*3/uL (ref 0.7–4.0)
Monocytes Relative: 11 % (ref 3–12)
Neutro Abs: 6.4 10*3/uL (ref 1.7–7.7)
Neutrophils Relative %: 63 % (ref 43–77)
RBC: 4.44 MIL/uL (ref 3.87–5.11)
WBC: 10.1 10*3/uL (ref 4.0–10.5)

## 2013-05-20 LAB — LIPASE, BLOOD: Lipase: 72 U/L — ABNORMAL HIGH (ref 11–59)

## 2013-05-20 LAB — URINE MICROSCOPIC-ADD ON

## 2013-05-20 MED ORDER — FAMOTIDINE IN NACL 20-0.9 MG/50ML-% IV SOLN
20.0000 mg | Freq: Once | INTRAVENOUS | Status: AC
  Administered 2013-05-20: 20 mg via INTRAVENOUS
  Filled 2013-05-20: qty 50

## 2013-05-20 MED ORDER — ALBUTEROL SULFATE (5 MG/ML) 0.5% IN NEBU: 2.5000 mg | INHALATION_SOLUTION | Freq: Four times a day (QID) | RESPIRATORY_TRACT | Status: DC | PRN

## 2013-05-20 MED ORDER — CEFTRIAXONE SODIUM 1 G IJ SOLR
1.0000 g | INTRAMUSCULAR | Status: DC
  Administered 2013-05-21: 1 g via INTRAVENOUS
  Filled 2013-05-20 (×2): qty 10

## 2013-05-20 MED ORDER — SODIUM CHLORIDE 0.9 % IV SOLN
INTRAVENOUS | Status: DC
  Administered 2013-05-20: 17:00:00 via INTRAVENOUS

## 2013-05-20 MED ORDER — DEXTROSE 5 % IV SOLN
1.0000 g | Freq: Once | INTRAVENOUS | Status: AC
  Administered 2013-05-20: 1 g via INTRAVENOUS
  Filled 2013-05-20: qty 10

## 2013-05-20 MED ORDER — DILTIAZEM HCL ER COATED BEADS 240 MG PO CP24
240.0000 mg | ORAL_CAPSULE | Freq: Every day | ORAL | Status: DC
  Administered 2013-05-21 – 2013-05-31 (×11): 240 mg via ORAL
  Filled 2013-05-20 (×14): qty 1

## 2013-05-20 MED ORDER — RISAQUAD PO CAPS
2.0000 | ORAL_CAPSULE | Freq: Every day | ORAL | Status: DC
  Administered 2013-05-21 – 2013-05-31 (×11): 2 via ORAL
  Filled 2013-05-20 (×13): qty 2

## 2013-05-20 MED ORDER — POTASSIUM CHLORIDE IN NACL 20-0.9 MEQ/L-% IV SOLN
INTRAVENOUS | Status: DC
  Administered 2013-05-20 – 2013-05-30 (×16): via INTRAVENOUS

## 2013-05-20 MED ORDER — FAMOTIDINE IN NACL 20-0.9 MG/50ML-% IV SOLN
INTRAVENOUS | Status: AC
  Filled 2013-05-20: qty 50

## 2013-05-20 MED ORDER — SODIUM CHLORIDE 0.9 % IV SOLN
INTRAVENOUS | Status: AC
  Administered 2013-05-20: 22:00:00 via INTRAVENOUS

## 2013-05-20 MED ORDER — ENOXAPARIN SODIUM 40 MG/0.4ML ~~LOC~~ SOLN
40.0000 mg | SUBCUTANEOUS | Status: DC
  Administered 2013-05-20 – 2013-05-31 (×11): 40 mg via SUBCUTANEOUS
  Filled 2013-05-20 (×11): qty 0.4

## 2013-05-20 MED ORDER — IPRATROPIUM BROMIDE 0.02 % IN SOLN: 0.5000 mg | Freq: Four times a day (QID) | RESPIRATORY_TRACT | Status: DC | PRN

## 2013-05-20 MED ORDER — ONDANSETRON HCL 4 MG/2ML IJ SOLN: 4.0000 mg | Freq: Three times a day (TID) | INTRAMUSCULAR | Status: DC | PRN

## 2013-05-20 MED ORDER — ACETAMINOPHEN 325 MG PO TABS
650.0000 mg | ORAL_TABLET | ORAL | Status: DC | PRN
  Administered 2013-05-21 – 2013-05-25 (×6): 650 mg via ORAL
  Filled 2013-05-20 (×7): qty 2

## 2013-05-20 MED ORDER — TRAZODONE HCL 50 MG PO TABS
50.0000 mg | ORAL_TABLET | Freq: Every evening | ORAL | Status: DC | PRN
  Administered 2013-05-20 – 2013-05-25 (×6): 50 mg via ORAL
  Filled 2013-05-20 (×6): qty 1

## 2013-05-20 MED ORDER — MORPHINE SULFATE 2 MG/ML IJ SOLN: 2.0000 mg | INTRAMUSCULAR | Status: DC | PRN

## 2013-05-20 MED ORDER — BIOTENE DRY MOUTH MT LIQD
15.0000 mL | Freq: Two times a day (BID) | OROMUCOSAL | Status: DC
  Administered 2013-05-20: 15 mL via OROMUCOSAL

## 2013-05-20 MED ORDER — ONDANSETRON HCL 4 MG/2ML IJ SOLN
4.0000 mg | INTRAMUSCULAR | Status: DC | PRN
  Administered 2013-05-21 – 2013-05-25 (×7): 4 mg via INTRAVENOUS
  Filled 2013-05-20 (×6): qty 2

## 2013-05-20 MED ORDER — FAMOTIDINE IN NACL 20-0.9 MG/50ML-% IV SOLN
20.0000 mg | Freq: Two times a day (BID) | INTRAVENOUS | Status: DC
  Administered 2013-05-20 – 2013-05-21 (×2): 20 mg via INTRAVENOUS
  Filled 2013-05-20 (×6): qty 50

## 2013-05-20 MED ORDER — ONDANSETRON HCL 4 MG/2ML IJ SOLN
4.0000 mg | INTRAMUSCULAR | Status: DC | PRN
  Administered 2013-05-20: 4 mg via INTRAVENOUS
  Filled 2013-05-20: qty 2

## 2013-05-20 NOTE — ED Notes (Signed)
P t is from [facility].  Alert, Began vomiting app 3 am. No diarrhea, No pain.

## 2013-05-20 NOTE — ED Notes (Signed)
MD notified  

## 2013-05-20 NOTE — ED Provider Notes (Signed)
History     CSN: [id number]  Arrival date & time 05/20/13  1310   First MD Initiated Contact with Patient 05/20/13 1328      Chief Complaint  Patient presents with  . Emesis     HPI Pt was seen at 1415.  Per EMS, [facility] NH report and pt, c/o gradual onset and persistence of multiple intermittent episodes of N/V that began approx 0300 PTA. Has been associated with upper abd "pain."  Denies diarrhea, no fevers, no CP/SOB, no cough, no back pain, no black or blood in stools or emesis.    Past Medical History  Diagnosis Date  . Hypertension   . Hyperlipidemia   . COPD (chronic obstructive pulmonary disease)     multiple hospitalizations for exacerbations with acute bronchitis  . Degenerative joint disease     Bilateral hips  . Depression with anxiety   . GERD (gastroesophageal reflux disease)     Hiatal hernia  . Atrial arrhythmia     Atrial fibrillation and atrial flutter diagnosed in the past;?  PSVT  . AAA (abdominal aortic aneurysm)     infrarenal; 07/2012: Diameter of 5.4 cm  . Arteriosclerotic cardiovascular disease (ASCVD)     Cardiac cath in 03/2000->  80% LAD, 70-80% RCA;  stent x1 in the LAD and x2 in the RCA with excellent angiographic outcome; repeat catheterization in late 2001 and 2003 revealed no restenosis; EF-50%  . Tobacco abuse, in remission     Quit in 2006; total consumption of 50-75 pack years  . Pituitary microadenoma     transsphenoidal resection in 09/2007  . Anemia   . Coronary artery disease   . PVD (peripheral vascular disease)   . Gall stones   . Hypokalemia   . Ileus   . SBO (small bowel obstruction)   . Hypernatremia   . Nephrolithiasis   . Renal failure   . DJD (degenerative joint disease)   . Encephalopathy     Past Surgical History  Procedure Laterality Date  . Kidney stone surgery    . Transphenoidal / transnasal hypophysectomy / resection pituitary tumor  09/2007  . Cardiac catheterization  2001    post PTCA and stenting of  her LAD and RCA    Family History  Problem Relation Age of Onset  . Heart disease Mother   . Hyperlipidemia Mother   . Hypertension Mother   . Stroke Mother   . Cancer Brother     History  Substance Use Topics  . Smoking status: Former Smoker -- 1.50 packs/day for 50 years    Types: Cigarettes    Quit date: 05/27/2005  . Smokeless tobacco: Former Neurosurgeon    Quit date: 01/11/2008  . Alcohol Use: No      Review of Systems ROS: Statement: All systems negative except as marked or noted in the HPI; Constitutional: Negative for fever and chills. ; ; Eyes: Negative for eye pain, redness and discharge. ; ; ENMT: Negative for ear pain, hoarseness, nasal congestion, sinus pressure and sore throat. ; ; Cardiovascular: Negative for chest pain, palpitations, diaphoresis, dyspnea and peripheral edema. ; ; Respiratory: Negative for cough, wheezing and stridor. ; ; Gastrointestinal: +N/V, abd pain. Negative for diarrhea, blood in stool, hematemesis, jaundice and rectal bleeding. . ; ; Genitourinary: Negative for dysuria, flank pain and hematuria. ; ; Musculoskeletal: Negative for back pain and neck pain. Negative for swelling and trauma.; ; Skin: Negative for pruritus, rash, abrasions, blisters, bruising and skin lesion.; ;  Neuro: Negative for headache, lightheadedness and neck stiffness. Negative for weakness, altered level of consciousness , altered mental status, extremity weakness, paresthesias, involuntary movement, seizure and syncope.       Allergies  Asa; Lidocaine; Nsaids; and Sulfonamide derivatives  Home Medications   Current Outpatient Rx  Name  Route  Sig  Dispense  Refill  . ALPRAZolam (XANAX) 0.5 MG tablet   Oral   Take 0.5 mg by mouth every 8 (eight) hours as needed.         . Choline Fenofibrate (FENOFIBRIC ACID) 135 MG CPDR   Oral   Take 1 tablet by mouth daily.         Marland Kitchen diltiazem (TIAZAC) 240 MG 24 hr capsule   Oral   Take 240 mg by mouth daily.         .  divalproex (DEPAKOTE SPRINKLE) 125 MG capsule   Oral   Take 125-250 mg by mouth 2 (two) times daily. Take two capsules in the morning and take one capsule at bedtime         . furosemide (LASIX) 40 MG tablet   Oral   Take 40 mg by mouth daily.         Marland Kitchen HYDROcodone-acetaminophen (NORCO) 10-325 MG per tablet   Oral   Take 1 tablet by mouth 3 (three) times daily as needed.         . mirtazapine (REMERON) 15 MG tablet   Oral   Take 15 mg by mouth at bedtime.         . nebivolol (BYSTOLIC) 5 MG tablet   Oral   Take 5 mg by mouth daily.         Marland Kitchen omeprazole (PRILOSEC) 20 MG capsule   Oral   Take 20 mg by mouth daily.         . rosuvastatin (CRESTOR) 10 MG tablet   Oral   Take 10 mg by mouth at bedtime.         Marland Kitchen tiotropium (SPIRIVA) 18 MCG inhalation capsule   Inhalation   Place 18 mcg into inhaler and inhale daily.         . traMADol (ULTRAM) 50 MG tablet   Oral   Take 50 mg by mouth every morning.          Marland Kitchen acetaminophen (Q-PAP) 500 MG tablet   Oral   Take 500 mg by mouth every 4 (four) hours as needed. For pain         . albuterol (PROVENTIL) (2.5 MG/3ML) 0.083% nebulizer solution   Nebulization   Take 2.5 mg by nebulization every 6 (six) hours as needed. For shortness of breath           BP 122/75  Pulse 82  Temp(Src) 99.3 F (37.4 C) (Oral)  Resp 18  Ht 5\' 6"  (1.676 m)  Wt 160 lb (72.576 kg)  BMI 25.84 kg/m2  SpO2 97%  Physical Exam 1420: Physical examination:  Nursing notes reviewed; Vital signs and O2 SAT reviewed;  Constitutional: Well developed, Well nourished, uncomfortable appearing; Head:  Normocephalic, atraumatic; Eyes: EOMI, PERRL, No scleral icterus; ENMT: Mouth and pharynx normal, Mucous membranes dry; Neck: Supple, Full range of motion, No lymphadenopathy; Cardiovascular: Regular rate and rhythm, No gallop; Respiratory: Breath sounds clear & equal bilaterally, No wheezes.  Speaking full sentences with ease, Normal  respiratory effort/excursion; Chest: Nontender, Movement normal; Abdomen: Soft, +mid-epigastric and LUQ tenderness to palp. No rebound or guarding. Nondistended, Normal bowel sounds; Genitourinary:  No CVA tenderness; Extremities: Pulses normal, No tenderness, No edema, No calf edema or asymmetry.; Neuro: AA&Ox3, vague historian. Major CN grossly intact.  Speech clear. No gross focal motor or sensory deficits in extremities.; Skin: Color normal, Warm, Dry.   ED Course  Procedures    MDM  MDM Reviewed: previous chart, nursing note and vitals Reviewed previous: labs, ECG, CT scan and ultrasound Interpretation: labs, ECG, x-ray and ultrasound    Date: 05/20/2013  Rate: 73  Rhythm: normal sinus rhythm  QRS Axis: normal  Intervals: normal  ST/T Wave abnormalities: normal  Conduction Disutrbances:right bundle branch block  Narrative Interpretation:   Old EKG Reviewed: unchanged; no significant changes from previous EKG dated 12/27/2012.  Results for orders placed during the hospital encounter of 05/20/13  URINALYSIS, ROUTINE W REFLEX MICROSCOPIC      Result Value Range   Color, Urine YELLOW  YELLOW   APPearance HAZY (*) CLEAR   Specific Gravity, Urine 1.010  1.005 - 1.030   pH 7.0  5.0 - 8.0   Glucose, UA NEGATIVE  NEGATIVE mg/dL   Hgb urine dipstick TRACE (*) NEGATIVE   Bilirubin Urine NEGATIVE  NEGATIVE   Ketones, ur NEGATIVE  NEGATIVE mg/dL   Protein, ur NEGATIVE  NEGATIVE mg/dL   Urobilinogen, UA 2.0 (*) 0.0 - 1.0 mg/dL   Nitrite NEGATIVE  NEGATIVE   Leukocytes, UA MODERATE (*) NEGATIVE  CBC WITH DIFFERENTIAL      Result Value Range   WBC 10.1  4.0 - 10.5 K/uL   RBC 4.44  3.87 - 5.11 MIL/uL   Hemoglobin 12.8  12.0 - 15.0 g/dL   HCT 16.1  09.6 - 04.5 %   MCV 89.6  78.0 - 100.0 fL   MCH 28.8  26.0 - 34.0 pg   MCHC 32.2  30.0 - 36.0 g/dL   RDW 40.9 (*) 81.1 - 91.4 %   Platelets 229  150 - 400 K/uL   Neutrophils Relative % 63  43 - 77 %   Neutro Abs 6.4  1.7 - 7.7 K/uL    Lymphocytes Relative 26  12 - 46 %   Lymphs Abs 2.6  0.7 - 4.0 K/uL   Monocytes Relative 11  3 - 12 %   Monocytes Absolute 1.1 (*) 0.1 - 1.0 K/uL   Eosinophils Relative 1  0 - 5 %   Eosinophils Absolute 0.1  0.0 - 0.7 K/uL   Basophils Relative 0  0 - 1 %   Basophils Absolute 0.0  0.0 - 0.1 K/uL  COMPREHENSIVE METABOLIC PANEL      Result Value Range   Sodium 145  135 - 145 mEq/L   Potassium 3.6  3.5 - 5.1 mEq/L   Chloride 105  96 - 112 mEq/L   CO2 29  19 - 32 mEq/L   Glucose, Bld 99  70 - 99 mg/dL   BUN 27 (*) 6 - 23 mg/dL   Creatinine, Ser 7.82 (*) 0.50 - 1.10 mg/dL   Calcium 8.5  8.4 - 95.6 mg/dL   Total Protein 6.8  6.0 - 8.3 g/dL   Albumin 2.5 (*) 3.5 - 5.2 g/dL   AST 60 (*) 0 - 37 U/L   ALT 30  0 - 35 U/L   Alkaline Phosphatase 122 (*) 39 - 117 U/L   Total Bilirubin 0.6  0.3 - 1.2 mg/dL   GFR calc non Af Amer 35 (*) >90 mL/min   GFR calc Af Amer 41 (*) >90 mL/min  LIPASE,  BLOOD      Result Value Range   Lipase 72 (*) 11 - 59 U/L  LACTIC ACID, PLASMA      Result Value Range   Lactic Acid, Venous 1.8  0.5 - 2.2 mmol/L  TROPONIN I      Result Value Range   Troponin I <0.30  <0.30 ng/mL  VALPROIC ACID LEVEL      Result Value Range   Valproic Acid Lvl 12.1 (*) 50.0 - 100.0 ug/mL  URINE MICROSCOPIC-ADD ON      Result Value Range   WBC, UA TOO NUMEROUS TO COUNT  <3 WBC/hpf   RBC / HPF 3-6  <3 RBC/hpf   Bacteria, UA MANY (*) RARE   US Abdomen Complete 05/20/2013   *RADIOLOGY REPORT*  Clinical Data:  Abdominal pain.  Nausea and vomiting.  Current history of abdominal aortic aneurysm.  COMPLETE ABDOMINAL ULTRASOUND  Comparison:  Abdominal ultrasound 12/28/2012.  CT abdomen pelvis of that same date.  Findings:  Gallbladder:  Sludge ball in the gallbladder fundus measuring approximate 2.4 cm which did not move with patient motion.  No associated acoustic shadowing to suggest gallstones.  On the prior examination, the sludge was mobile.  Mild gallbladder wall thickening up to 4 mm.   Negative sonographic Murphy's sign according to the ultrasound technologist.  Common bile duct:  Normal in caliber with maximum diameter approximating 7-8  mm.  Liver:  No focal mass lesion seen.  Within normal limits in parenchymal echogenicity.  IVC:  Patent.  Pancreas:  Although the pancreas is difficult to visualize in its entirety, no focal pancreatic abnormality is identified.  Spleen:  Normal size and echotexture without focal parenchymal abnormality.  Right Kidney:  No hydronephrosis.  Relatively well-preserved cortex.  Mildly echogenic parenchyma.  Approximate 1.8 cm simple cyst in the upper pole and approximate 0.8 cm simple cyst in the lower pole, unchanged.  No significant focal parenchymal abnormality.  No visible shadowing calculi.  Approximately 10.7 cm in length.  Left Kidney:  No hydronephrosis.  Relatively well-preserved cortex. Mildly echogenic parenchyma.  No focal parenchymal abnormality.  No visible shadowing calculi.  Approximately 10.0 cm in length.  Abdominal aorta:  Infrarenal abdominal aortic aneurysm with a large amount of mural thrombus and plaque, maximum AP diameter approximating 5.7 cm in maximum transverse diameter approximating 6.8 cm not significantly changed since the prior examination by my measurements.  IMPRESSION:  1.  Non mobile sludge ball in the gallbladder fundus.  Mild gallbladder wall thickening up to 4 mm without sonographic Murphy's sign may indicate chronic cholecystitis.  2.  Infrarenal abdominal aortic aneurysm, maximum dimensions 5.7 x 6.8 cm, not significantly changed since the January, 2014 examination. 3.  No significant abnormalities otherwise.   Original Report Authenticated By: Hulan Saas, M.D.   Dg Abd Acute W/chest 05/20/2013   *RADIOLOGY REPORT*  Clinical Data: Abdominal pain.  Nausea and vomiting.  ACUTE ABDOMEN SERIES (ABDOMEN 2 VIEW & CHEST 1 VIEW)  Comparison: CT abdomen and pelvis 12/28/2012.  Two-view chest x-ray 12/27/2012.  Findings: Bowel  gas pattern unremarkable without evidence of obstruction or significant ileus.  Moderate to large stool burden. Aorto-iliac atherosclerosis with lower abdominal aortic aneurysm as noted on prior CT measuring approximately 6.4 cm diameter uncorrected for radiographic magnification (5.2 cm on the prior CT).  Phleboliths in the left side of the pelvis.  No visible opaque urinary tract calculi.  Severe degenerative changes involving both hip joints with remodeling of the left hip joint.  Cardiac  silhouette normal in size, unchanged.  Thoracic aorta atherosclerotic, unchanged.  Hilar and mediastinal contours otherwise unremarkable.  Stable minimal scarring at the left lung base.  Lungs otherwise clear.  Cervicothoracic scoliosis convex left and thoracic scoliosis convex right.  IMPRESSION:  1.  No acute abdominal abnormality. 2.  Minimal scarring at the left lung base.  No acute cardiopulmonary disease.   Original Report Authenticated By: Hulan Saas, M.D.     Results for [name], [name] M (MRN [id number]) as of 05/20/2013 19:39  Ref. Range 08/19/2012 05:40 08/31/2012 14:46 09/01/2012 05:18 12/27/2012 21:03 12/28/2012 04:42 05/20/2013 15:00  BUN Latest Range: 6-23 mg/dL 19 11 10 20 19 27  (H)  Creatinine Latest Range: 0.50-1.10 mg/dL 6.29 5.28 4.13 2.44 (H) 1.31 (H) 1.41 (H)     1845:  BUN/Cr elevated from previous. Pt appears clinically dehydrated. Continues to c/o abd pain, nausea.  Will re-medicate. +UTI, UC pending; will start IV rocephin.  Dx and testing d/w pt.  Questions answered.  Verb understanding, agreeable to observation admit.  T/C to [facility] Dr. Kerry Hough, case discussed, including:  HPI, pertinent PM/SHx, VS/PE, dx testing, ED course and treatment:  Agreeable to observation admit, requests to write temporary orders, obtain medical bed to Dr. Juanetta Gosling' service.        Laray Anger, DO 05/23/13 1649

## 2013-05-20 NOTE — H&P (Signed)
[facility] History and Physical  [name] [name]  ZOX:[id number]  DOB: [birth year]   DOA: 05/20/2013   PCP:   Fredirick Maudlin, MD   Chief Complaint:  Vomiting and diarrhea since today   HPI: [name] [name] is an 78 y.o. female.   Elderly Caucasian lady a resident of [facility] nursing facility sent in with the above complaints. In fact the facility does not report diarrhea but patient and says she's been having bloody stools since today is wondering if her abdominal aneurysm is ruptured. Stop noted no bloody diarrhea since she's been in the hospital.  She reports 5 episodes of vomiting and 5 episodes of diarrhea since today.  Other than this it very difficult to get a history from this lady is since she has a very strange affect and sometimes does not appear to understand the conversation.  Rewiew of Systems:   Unable to obtain because of possible delirium  Past Medical History  Diagnosis Date  . Hypertension   . Hyperlipidemia   . COPD (chronic obstructive pulmonary disease)     multiple hospitalizations for exacerbations with acute bronchitis  . Degenerative joint disease     Bilateral hips  . Depression with anxiety   . GERD (gastroesophageal reflux disease)     Hiatal hernia  . Atrial arrhythmia     Atrial fibrillation and atrial flutter diagnosed in the past;?  PSVT  . AAA (abdominal aortic aneurysm)     infrarenal; 07/2012: Diameter of 5.4 cm  . Arteriosclerotic cardiovascular disease (ASCVD)     Cardiac cath in 03/2000->  80% LAD, 70-80% RCA;  stent x1 in the LAD and x2 in the RCA with excellent angiographic outcome; repeat catheterization in late 2001 and 2003 revealed no restenosis; EF-50%  . Tobacco abuse, in remission     Quit in 2006; total consumption of 50-75 pack years  . Pituitary microadenoma     transsphenoidal resection in 09/2007  . Anemia   . Coronary artery disease   . PVD (peripheral vascular disease)   . Gall stones   . Hypokalemia   .  Ileus   . SBO (small bowel obstruction)   . Hypernatremia   . Nephrolithiasis   . Renal failure   . DJD (degenerative joint disease)   . Encephalopathy     Past Surgical History  Procedure Laterality Date  . Kidney stone surgery    . Transphenoidal / transnasal hypophysectomy / resection pituitary tumor  09/2007  . Cardiac catheterization  2001    post PTCA and stenting of her LAD and RCA    Medications:  HOME MEDS: Prior to Admission medications   Medication Sig Start Date End Date Taking? Authorizing Provider  ALPRAZolam Prudy Feeler) 0.5 MG tablet Take 0.5 mg by mouth every 8 (eight) hours as needed.   Yes Historical Provider, MD  Choline Fenofibrate (FENOFIBRIC ACID) 135 MG CPDR Take 1 tablet by mouth daily.   Yes Historical Provider, MD  diltiazem (TIAZAC) 240 MG 24 hr capsule Take 240 mg by mouth daily.   Yes Historical Provider, MD  divalproex (DEPAKOTE SPRINKLE) 125 MG capsule Take 125-250 mg by mouth 2 (two) times daily. Take two capsules in the morning and take one capsule at bedtime   Yes Historical Provider, MD  furosemide (LASIX) 40 MG tablet Take 40 mg by mouth daily.   Yes Historical Provider, MD  HYDROcodone-acetaminophen (NORCO) 10-325 MG per tablet Take 1 tablet by mouth 3 (three) times daily as needed.  Yes Historical Provider, MD  mirtazapine (REMERON) 15 MG tablet Take 15 mg by mouth at bedtime.   Yes Historical Provider, MD  nebivolol (BYSTOLIC) 5 MG tablet Take 5 mg by mouth daily.   Yes Historical Provider, MD  omeprazole (PRILOSEC) 20 MG capsule Take 20 mg by mouth daily.   Yes Historical Provider, MD  rosuvastatin (CRESTOR) 10 MG tablet Take 10 mg by mouth at bedtime.   Yes Historical Provider, MD  tiotropium (SPIRIVA) 18 MCG inhalation capsule Place 18 mcg into inhaler and inhale daily.   Yes Historical Provider, MD  traMADol (ULTRAM) 50 MG tablet Take 50 mg by mouth every morning.    Yes Historical Provider, MD  acetaminophen (Q-PAP) 500 MG tablet Take 500 mg  by mouth every 4 (four) hours as needed. For pain    Historical Provider, MD  albuterol (PROVENTIL) (2.5 MG/3ML) 0.083% nebulizer solution Take 2.5 mg by nebulization every 6 (six) hours as needed. For shortness of breath    Historical Provider, MD     Allergies:  Allergies  Allergen Reactions  . Asa (Aspirin) Other (See Comments)    "my ears bleed"  . Lidocaine Other (See Comments)    unknown  . Nsaids Other (See Comments)    unknown  . Sulfonamide Derivatives Other (See Comments)    unknown    Social History:   reports that she quit smoking about 7 years ago. Her smoking use included Cigarettes. She has a 75 pack-year smoking history. She quit smokeless tobacco use about 5 years ago. She reports that she does not drink alcohol or use illicit drugs.  Family History: Family History  Problem Relation Age of Onset  . Heart disease Mother   . Hyperlipidemia Mother   . Hypertension Mother   . Stroke Mother   . Cancer Brother      Physical Exam: Filed Vitals:   05/20/13 1322 05/20/13 2123  BP: 122/75 129/77  Pulse: 82 70  Temp: 99.3 F (37.4 C) 98.7 F (37.1 C)  TempSrc: Oral Oral  Resp: 18 18  Height: 5\' 6"  (1.676 m)   Weight: 72.576 kg (160 lb)   SpO2: 97% 97%   Blood pressure 129/77, pulse 70, temperature 98.7 F (37.1 C), temperature source Oral, resp. rate 18, height 5\' 6"  (1.676 m), weight 72.576 kg (160 lb), SpO2 97.00%.  GEN:  Pleasant but somewhat confused elderly Caucasian lady ying bed in no acute distress; cooperative with exam PSYCH:  alert and oriented x3;  neither anxious nor depressed;  Affect is incongruent HEENT: Mucous membranes pink, dry and anicteric; PERRLA; EOM intact; no cervical lymphadenopathy nor thyromegaly or carotid bruit; no JVD; Breasts:: Not examined CHEST WALL: No tenderness CHEST: Normal respiration, clear to auscultation bilaterally HEART: Regular rate and rhythm; no murmurs rubs or gallops BACK: Mild kyphosis no scoliosis; no CVA  tenderness ABDOMEN: Obese, soft non-tender; no masses, no organomegaly, normal abdominal bowel sounds; no pannus; no intertriginous candida. Rectal Exam: Not done EXTREMITIES: ; age-appropriate arthropathy of the hands and knees; 1+ edema; no ulcerations. Genitalia: not examined PULSES: 2+ and symmetric SKIN: Normal hydration no rash or ulceration CNS: Cranial nerves 2-12 grossly intact no focal lateralizing neurologic deficit   Labs on Admission:  Basic Metabolic Panel:  Recent Labs Lab 05/20/13 1500  NA 145  K 3.6  CL 105  CO2 29  GLUCOSE 99  BUN 27*  CREATININE 1.41*  CALCIUM 8.5   Liver Function Tests:  Recent Labs Lab 05/20/13 1500  AST  60*  ALT 30  ALKPHOS 122*  BILITOT 0.6  PROT 6.8  ALBUMIN 2.5*    Recent Labs Lab 05/20/13 1500  LIPASE 72*   No results found for this basename: AMMONIA,  in the last 168 hours CBC:  Recent Labs Lab 05/20/13 1500  WBC 10.1  NEUTROABS 6.4  HGB 12.8  HCT 39.8  MCV 89.6  PLT 229   Cardiac Enzymes:  Recent Labs Lab 05/20/13 1500  TROPONINI <0.30   BNP: No components found with this basename: POCBNP,  D-dimer: No components found with this basename: D-DIMER,  CBG: No results found for this basename: GLUCAP,  in the last 168 hours  Radiological Exams on Admission: US Abdomen Complete  05/20/2013   *RADIOLOGY REPORT*  Clinical Data:  Abdominal pain.  Nausea and vomiting.  Current history of abdominal aortic aneurysm.  COMPLETE ABDOMINAL ULTRASOUND  Comparison:  Abdominal ultrasound 12/28/2012.  CT abdomen pelvis of that same date.  Findings:  Gallbladder:  Sludge ball in the gallbladder fundus measuring approximate 2.4 cm which did not move with patient motion.  No associated acoustic shadowing to suggest gallstones.  On the prior examination, the sludge was mobile.  Mild gallbladder wall thickening up to 4 mm.  Negative sonographic Murphy's sign according to the ultrasound technologist.  Common bile duct:  Normal  in caliber with maximum diameter approximating 7-8  mm.  Liver:  No focal mass lesion seen.  Within normal limits in parenchymal echogenicity.  IVC:  Patent.  Pancreas:  Although the pancreas is difficult to visualize in its entirety, no focal pancreatic abnormality is identified.  Spleen:  Normal size and echotexture without focal parenchymal abnormality.  Right Kidney:  No hydronephrosis.  Relatively well-preserved cortex.  Mildly echogenic parenchyma.  Approximate 1.8 cm simple cyst in the upper pole and approximate 0.8 cm simple cyst in the lower pole, unchanged.  No significant focal parenchymal abnormality.  No visible shadowing calculi.  Approximately 10.7 cm in length.  Left Kidney:  No hydronephrosis.  Relatively well-preserved cortex. Mildly echogenic parenchyma.  No focal parenchymal abnormality.  No visible shadowing calculi.  Approximately 10.0 cm in length.  Abdominal aorta:  Infrarenal abdominal aortic aneurysm with a large amount of mural thrombus and plaque, maximum AP diameter approximating 5.7 cm in maximum transverse diameter approximating 6.8 cm not significantly changed since the prior examination by my measurements.  IMPRESSION:  1.  Non mobile sludge ball in the gallbladder fundus.  Mild gallbladder wall thickening up to 4 mm without sonographic Murphy's sign may indicate chronic cholecystitis.  2.  Infrarenal abdominal aortic aneurysm, maximum dimensions 5.7 x 6.8 cm, not significantly changed since the January, 2014 examination. 3.  No significant abnormalities otherwise.   Original Report Authenticated By: Hulan Saas, M.D.   Dg Abd Acute W/chest  05/20/2013   *RADIOLOGY REPORT*  Clinical Data: Abdominal pain.  Nausea and vomiting.  ACUTE ABDOMEN SERIES (ABDOMEN 2 VIEW & CHEST 1 VIEW)  Comparison: CT abdomen and pelvis 12/28/2012.  Two-view chest x-ray 12/27/2012.  Findings: Bowel gas pattern unremarkable without evidence of obstruction or significant ileus.  Moderate to large stool  burden. Aorto-iliac atherosclerosis with lower abdominal aortic aneurysm as noted on prior CT measuring approximately 6.4 cm diameter uncorrected for radiographic magnification (5.2 cm on the prior CT).  Phleboliths in the left side of the pelvis.  No visible opaque urinary tract calculi.  Severe degenerative changes involving both hip joints with remodeling of the left hip joint.  Cardiac silhouette normal in  size, unchanged.  Thoracic aorta atherosclerotic, unchanged.  Hilar and mediastinal contours otherwise unremarkable.  Stable minimal scarring at the left lung base.  Lungs otherwise clear.  Cervicothoracic scoliosis convex left and thoracic scoliosis convex right.  IMPRESSION:  1.  No acute abdominal abnormality. 2.  Minimal scarring at the left lung base.  No acute cardiopulmonary disease.   Original Report Authenticated By: Hulan Saas, M.D.       Assessment/Plan   Active Problems:   UTI (urinary tract infection)  Possible cause of her delirium; possibly also contributing to the GI symptoms;  We'll admit for hydration and empiric treatment with Rocephin pending the results of urine culture   Nausea and vomiting  IV fluids and antiemetics; treatment of urinary tract infection   COPD (chronic obstructive pulmonary disease)  Stable; continue chronic management   AAA (abdominal aortic aneurysm)  Stable   Abdominal pain   Renal insufficiency  Likely secondary to the nausea vomiting; treatment hydration and followup renal function on  Her primary care physician who is more familiar with this patient's mental status will assume care in the morning Other plans as per orders.  Code Status: Full code Family Communication: No family available at this time Disposition Plan: Likely return to apply to [facility] nursing home when stable    Analena Gama Nocturnist [facility] Pager [phone]   05/20/2013, 9:29 PM

## 2013-05-21 DIAGNOSIS — N390 Urinary tract infection, site not specified: Secondary | ICD-10-CM | POA: Diagnosis not present

## 2013-05-21 LAB — COMPREHENSIVE METABOLIC PANEL
ALT: 28 U/L (ref 0–35)
AST: 53 U/L — ABNORMAL HIGH (ref 0–37)
Alkaline Phosphatase: 92 U/L (ref 39–117)
CO2: 27 mEq/L (ref 19–32)
Calcium: 8.4 mg/dL (ref 8.4–10.5)
Chloride: 110 mEq/L (ref 96–112)
GFR calc non Af Amer: 45 mL/min — ABNORMAL LOW (ref >90)
Potassium: 3.6 mEq/L (ref 3.5–5.1)
Sodium: 144 mEq/L (ref 135–145)

## 2013-05-21 LAB — CBC
HCT: 38.4 % (ref 36.0–46.0)
Hemoglobin: 12.6 g/dL (ref 12.0–15.0)
MCV: 87.9 fL (ref 78.0–100.0)
RBC: 4.37 MIL/uL (ref 3.87–5.11)
RDW: 16.1 % — ABNORMAL HIGH (ref 11.5–15.5)
WBC: 8.8 10*3/uL (ref 4.0–10.5)

## 2013-05-21 LAB — TSH: TSH: 2.506 u[IU]/mL (ref 0.350–4.500)

## 2013-05-21 LAB — MRSA PCR SCREENING: MRSA by PCR: POSITIVE — AB

## 2013-05-21 MED ORDER — FAMOTIDINE IN NACL 20-0.9 MG/50ML-% IV SOLN
20.0000 mg | INTRAVENOUS | Status: DC
Start: 2013-05-22 — End: 2013-05-28
  Administered 2013-05-22 – 2013-05-27 (×6): 20 mg via INTRAVENOUS
  Filled 2013-05-21 (×9): qty 50

## 2013-05-21 MED ORDER — MUPIROCIN 2 % EX OINT
1.0000 "application " | TOPICAL_OINTMENT | Freq: Two times a day (BID) | CUTANEOUS | Status: AC
  Administered 2013-05-21 – 2013-05-25 (×9): 1 via NASAL
  Filled 2013-05-21 (×2): qty 22

## 2013-05-21 MED ORDER — MUPIROCIN 2 % EX OINT: 1.0000 "application " | TOPICAL_OINTMENT | Freq: Two times a day (BID) | CUTANEOUS | Status: DC

## 2013-05-21 MED ORDER — CHLORHEXIDINE GLUCONATE CLOTH 2 % EX PADS
6.0000 | MEDICATED_PAD | Freq: Every day | CUTANEOUS | Status: AC
  Administered 2013-05-22 – 2013-05-25 (×4): 6 via TOPICAL

## 2013-05-21 NOTE — Progress Notes (Signed)
Subjective: She says she feels better. She has less abdominal discomfort. Her mental status is at baseline now.  Objective: Vital signs in last 24 hours: Temp:  [97.9 F (36.6 C)-99.3 F (37.4 C)] 97.9 F (36.6 C) (05/31 0454) Pulse Rate:  [70-82] 72 (05/31 0637) Resp:  [18] 18 (05/31 0637) BP: (122-152)/(75-87) 152/87 mmHg (05/31 0637) SpO2:  [97 %-99 %] 99 % (05/31 0637) Weight:  [68.1 kg (150 lb 2.1 oz)-72.576 kg (160 lb)] 70.4 kg (155 lb 3.3 oz) (05/31 0981) Weight change:  Last BM Date: 05/20/13 (pt. states)  Intake/Output from previous day: 05/30 0701 - 05/31 0700 In: 1205.4 [P.O.:300; I.V.:855.4; IV Piggyback:50] Out: -   PHYSICAL EXAM General appearance: alert and mild distress Resp: rhonchi bilaterally Cardio: regular rate and rhythm, S1, S2 normal, no murmur, click, rub or gallop GI: soft, non-tender; bowel sounds normal; no masses,  no organomegaly Extremities: extremities normal, atraumatic, no cyanosis or edema  Lab Results:    Basic Metabolic Panel:  Recent Labs  19/14/78 1500 05/21/13 0647  NA 145 144  K 3.6 3.6  CL 105 110  CO2 29 27  GLUCOSE 99 81  BUN 27* 20  CREATININE 1.41* 1.15*  CALCIUM 8.5 8.4   Liver Function Tests:  Recent Labs  05/20/13 1500 05/21/13 0647  AST 60* 53*  ALT 30 28  ALKPHOS 122* 92  BILITOT 0.6 0.6  PROT 6.8 5.6*  ALBUMIN 2.5* 2.1*    Recent Labs  05/20/13 1500  LIPASE 72*   No results found for this basename: AMMONIA,  in the last 72 hours CBC:  Recent Labs  05/20/13 1500 05/21/13 0647  WBC 10.1 8.8  NEUTROABS 6.4  --   HGB 12.8 12.6  HCT 39.8 38.4  MCV 89.6 87.9  PLT 229 186   Cardiac Enzymes:  Recent Labs  05/20/13 1500  TROPONINI <0.30   BNP: No results found for this basename: PROBNP,  in the last 72 hours D-Dimer: No results found for this basename: DDIMER,  in the last 72 hours CBG: No results found for this basename: GLUCAP,  in the last 72 hours Hemoglobin A1C: No results  found for this basename: HGBA1C,  in the last 72 hours Fasting Lipid Panel: No results found for this basename: CHOL, HDL, LDLCALC, TRIG, CHOLHDL, LDLDIRECT,  in the last 72 hours Thyroid Function Tests: No results found for this basename: TSH, T4TOTAL, FREET4, T3FREE, THYROIDAB,  in the last 72 hours Anemia Panel: No results found for this basename: VITAMINB12, FOLATE, FERRITIN, TIBC, IRON, RETICCTPCT,  in the last 72 hours Coagulation: No results found for this basename: LABPROT, INR,  in the last 72 hours Urine Drug Screen: Drugs of Abuse  No results found for this basename: labopia, cocainscrnur, labbenz, amphetmu, thcu, labbarb    Alcohol Level: No results found for this basename: ETH,  in the last 72 hours Urinalysis:  Recent Labs  05/20/13 1457  COLORURINE YELLOW  LABSPEC 1.010  PHURINE 7.0  GLUCOSEU NEGATIVE  HGBUR TRACE*  BILIRUBINUR NEGATIVE  KETONESUR NEGATIVE  PROTEINUR NEGATIVE  UROBILINOGEN 2.0*  NITRITE NEGATIVE  LEUKOCYTESUR MODERATE*   Misc. Labs:  ABGS No results found for this basename: PHART, PCO2, PO2ART, TCO2, HCO3,  in the last 72 hours CULTURES Recent Results (from the past 240 hour(s))  MRSA PCR SCREENING     Status: Abnormal   Collection Time    05/20/13  9:54 PM      Result Value Range Status   MRSA by PCR  POSITIVE (*) NEGATIVE Final   Comment:            The GeneXpert MRSA Assay (FDA     approved for NASAL specimens     only), is one component of a     comprehensive MRSA colonization     surveillance program. It is not     intended to diagnose MRSA     infection nor to guide or     monitor treatment for     MRSA infections.     RESULT CALLED TO, READ BACK BY AND VERIFIED WITH:     NEILSON,T @ 0200 ON 05/21/13 BY WOODIE,J   Studies/Results: US Abdomen Complete  05/20/2013   *RADIOLOGY REPORT*  Clinical Data:  Abdominal pain.  Nausea and vomiting.  Current history of abdominal aortic aneurysm.  COMPLETE ABDOMINAL ULTRASOUND   Comparison:  Abdominal ultrasound 12/28/2012.  CT abdomen pelvis of that same date.  Findings:  Gallbladder:  Sludge ball in the gallbladder fundus measuring approximate 2.4 cm which did not move with patient motion.  No associated acoustic shadowing to suggest gallstones.  On the prior examination, the sludge was mobile.  Mild gallbladder wall thickening up to 4 mm.  Negative sonographic Murphy's sign according to the ultrasound technologist.  Common bile duct:  Normal in caliber with maximum diameter approximating 7-8  mm.  Liver:  No focal mass lesion seen.  Within normal limits in parenchymal echogenicity.  IVC:  Patent.  Pancreas:  Although the pancreas is difficult to visualize in its entirety, no focal pancreatic abnormality is identified.  Spleen:  Normal size and echotexture without focal parenchymal abnormality.  Right Kidney:  No hydronephrosis.  Relatively well-preserved cortex.  Mildly echogenic parenchyma.  Approximate 1.8 cm simple cyst in the upper pole and approximate 0.8 cm simple cyst in the lower pole, unchanged.  No significant focal parenchymal abnormality.  No visible shadowing calculi.  Approximately 10.7 cm in length.  Left Kidney:  No hydronephrosis.  Relatively well-preserved cortex. Mildly echogenic parenchyma.  No focal parenchymal abnormality.  No visible shadowing calculi.  Approximately 10.0 cm in length.  Abdominal aorta:  Infrarenal abdominal aortic aneurysm with a large amount of mural thrombus and plaque, maximum AP diameter approximating 5.7 cm in maximum transverse diameter approximating 6.8 cm not significantly changed since the prior examination by my measurements.  IMPRESSION:  1.  Non mobile sludge ball in the gallbladder fundus.  Mild gallbladder wall thickening up to 4 mm without sonographic Murphy's sign may indicate chronic cholecystitis.  2.  Infrarenal abdominal aortic aneurysm, maximum dimensions 5.7 x 6.8 cm, not significantly changed since the January, 2014  examination. 3.  No significant abnormalities otherwise.   Original Report Authenticated By: Hulan Saas, M.D.   Dg Abd Acute W/chest  05/20/2013   *RADIOLOGY REPORT*  Clinical Data: Abdominal pain.  Nausea and vomiting.  ACUTE ABDOMEN SERIES (ABDOMEN 2 VIEW & CHEST 1 VIEW)  Comparison: CT abdomen and pelvis 12/28/2012.  Two-view chest x-ray 12/27/2012.  Findings: Bowel gas pattern unremarkable without evidence of obstruction or significant ileus.  Moderate to large stool burden. Aorto-iliac atherosclerosis with lower abdominal aortic aneurysm as noted on prior CT measuring approximately 6.4 cm diameter uncorrected for radiographic magnification (5.2 cm on the prior CT).  Phleboliths in the left side of the pelvis.  No visible opaque urinary tract calculi.  Severe degenerative changes involving both hip joints with remodeling of the left hip joint.  Cardiac silhouette normal in size, unchanged.  Thoracic aorta  atherosclerotic, unchanged.  Hilar and mediastinal contours otherwise unremarkable.  Stable minimal scarring at the left lung base.  Lungs otherwise clear.  Cervicothoracic scoliosis convex left and thoracic scoliosis convex right.  IMPRESSION:  1.  No acute abdominal abnormality. 2.  Minimal scarring at the left lung base.  No acute cardiopulmonary disease.   Original Report Authenticated By: Hulan Saas, M.D.    Medications:  Prior to Admission:  Prescriptions prior to admission  Medication Sig Dispense Refill  . ALPRAZolam (XANAX) 0.5 MG tablet Take 0.5 mg by mouth every 8 (eight) hours as needed.      . Choline Fenofibrate (FENOFIBRIC ACID) 135 MG CPDR Take 1 tablet by mouth daily.      Marland Kitchen diltiazem (TIAZAC) 240 MG 24 hr capsule Take 240 mg by mouth daily.      . divalproex (DEPAKOTE SPRINKLE) 125 MG capsule Take 125-250 mg by mouth 2 (two) times daily. Take two capsules in the morning and take one capsule at bedtime      . furosemide (LASIX) 40 MG tablet Take 40 mg by mouth daily.       Marland Kitchen HYDROcodone-acetaminophen (NORCO) 10-325 MG per tablet Take 1 tablet by mouth 3 (three) times daily as needed.      . mirtazapine (REMERON) 15 MG tablet Take 15 mg by mouth at bedtime.      . nebivolol (BYSTOLIC) 5 MG tablet Take 5 mg by mouth daily.      Marland Kitchen omeprazole (PRILOSEC) 20 MG capsule Take 20 mg by mouth daily.      . rosuvastatin (CRESTOR) 10 MG tablet Take 10 mg by mouth at bedtime.      Marland Kitchen tiotropium (SPIRIVA) 18 MCG inhalation capsule Place 18 mcg into inhaler and inhale daily.      . traMADol (ULTRAM) 50 MG tablet Take 50 mg by mouth every morning.       Marland Kitchen acetaminophen (Q-PAP) 500 MG tablet Take 500 mg by mouth every 4 (four) hours as needed. For pain      . albuterol (PROVENTIL) (2.5 MG/3ML) 0.083% nebulizer solution Take 2.5 mg by nebulization every 6 (six) hours as needed. For shortness of breath       Scheduled: . sodium chloride   Intravenous STAT  . acidophilus  2 capsule Oral Daily  . cefTRIAXone (ROCEPHIN)  IV  1 g Intravenous Q24H  . [START ON 05/22/2013] Chlorhexidine Gluconate Cloth  6 each Topical Q0600  . diltiazem  240 mg Oral Daily  . enoxaparin (LOVENOX) injection  40 mg Subcutaneous Q24H  . famotidine (PEPCID) IV  20 mg Intravenous Q12H  . mupirocin ointment  1 application Nasal BID   Continuous: . 0.9 % NaCl with KCl 20 mEq / L 100 mL/hr at 05/20/13 2339   QMV:[id number], albuterol, ipratropium, ondansetron (ZOFRAN) IV, traZODone  Assesment: She was admitted with a urinary tract infection did seem to have caused her to have abdominal pain nausea and vomiting. Her renal insufficiency is better. She has multiple other medical problems including COPD. She has an abdominal aortic aneurysm and has not been felt to be a good candidate for surgery. Active Problems:   COPD (chronic obstructive pulmonary disease)   AAA (abdominal aortic aneurysm)   Abdominal pain   Nausea and vomiting   UTI (urinary tract infection)   Renal insufficiency    Plan: No  change in treatments    LOS: 1 day   [name] [name] L 05/21/2013, 9:42 AM

## 2013-05-22 ENCOUNTER — Inpatient Hospital Stay (HOSPITAL_COMMUNITY): Payer: Medicare Other

## 2013-05-22 MED ORDER — PIPERACILLIN-TAZOBACTAM 3.375 G IVPB
3.3750 g | Freq: Three times a day (TID) | INTRAVENOUS | Status: DC
Start: 2013-05-22 — End: 2013-05-25
  Administered 2013-05-22 – 2013-05-25 (×10): 3.375 g via INTRAVENOUS
  Filled 2013-05-22 (×13): qty 50

## 2013-05-22 MED ORDER — CIPROFLOXACIN IN D5W 400 MG/200ML IV SOLN
400.0000 mg | Freq: Two times a day (BID) | INTRAVENOUS | Status: DC
  Administered 2013-05-22 – 2013-05-25 (×7): 400 mg via INTRAVENOUS
  Filled 2013-05-22 (×9): qty 200

## 2013-05-22 MED ORDER — ALPRAZOLAM 0.5 MG PO TABS
0.5000 mg | ORAL_TABLET | Freq: Three times a day (TID) | ORAL | Status: DC | PRN
  Administered 2013-05-22 – 2013-05-24 (×5): 0.5 mg via ORAL
  Filled 2013-05-22 (×6): qty 1

## 2013-05-22 NOTE — Progress Notes (Signed)
Subjective: She is still having abdominal pain and had more nausea and vomiting yesterday. I am not certain that this is related to urinary tract infection. It may be more related to the chronic cholecystitis seen on ultrasound  Objective: Vital signs in last 24 hours: Temp:  [96.1 F (35.6 C)-98.5 F (36.9 C)] 97.6 F (36.4 C) (06/01 0425) Pulse Rate:  [64-75] 67 (06/01 0425) Resp:  [17-20] 20 (06/01 0425) BP: (114-161)/(65-87) 114/77 mmHg (06/01 0425) SpO2:  [95 %-98 %] 95 % (06/01 0425) Weight:  [70.3 kg (154 lb 15.7 oz)] 70.3 kg (154 lb 15.7 oz) (06/01 0425) Weight change: -2.276 kg (-5 lb 0.3 oz) Last BM Date: 05/21/13  Intake/Output from previous day: 05/31 0701 - 06/01 0700 In: 2267.7 [P.O.:420; I.V.:1747.7; IV Piggyback:100] Out: 400 [Urine:400]  PHYSICAL EXAM General appearance: alert and mild distress Resp: rhonchi bilaterally Cardio: regular rate and rhythm, S1, S2 normal, no murmur, click, rub or gallop GI: Mildly diffusely tender Extremities: extremities normal, atraumatic, no cyanosis or edema  Lab Results:    Basic Metabolic Panel:  Recent Labs  40/98/11 1500 05/21/13 0647  NA 145 144  K 3.6 3.6  CL 105 110  CO2 29 27  GLUCOSE 99 81  BUN 27* 20  CREATININE 1.41* 1.15*  CALCIUM 8.5 8.4   Liver Function Tests:  Recent Labs  05/20/13 1500 05/21/13 0647  AST 60* 53*  ALT 30 28  ALKPHOS 122* 92  BILITOT 0.6 0.6  PROT 6.8 5.6*  ALBUMIN 2.5* 2.1*    Recent Labs  05/20/13 1500  LIPASE 72*   No results found for this basename: AMMONIA,  in the last 72 hours CBC:  Recent Labs  05/20/13 1500 05/21/13 0647  WBC 10.1 8.8  NEUTROABS 6.4  --   HGB 12.8 12.6  HCT 39.8 38.4  MCV 89.6 87.9  PLT 229 186   Cardiac Enzymes:  Recent Labs  05/20/13 1500  TROPONINI <0.30   BNP: No results found for this basename: PROBNP,  in the last 72 hours D-Dimer: No results found for this basename: DDIMER,  in the last 72 hours CBG: No results  found for this basename: GLUCAP,  in the last 72 hours Hemoglobin A1C:  Recent Labs  05/21/13 0647  HGBA1C 5.4   Fasting Lipid Panel: No results found for this basename: CHOL, HDL, LDLCALC, TRIG, CHOLHDL, LDLDIRECT,  in the last 72 hours Thyroid Function Tests:  Recent Labs  05/21/13 0647  TSH 2.506   Anemia Panel: No results found for this basename: VITAMINB12, FOLATE, FERRITIN, TIBC, IRON, RETICCTPCT,  in the last 72 hours Coagulation: No results found for this basename: LABPROT, INR,  in the last 72 hours Urine Drug Screen: Drugs of Abuse  No results found for this basename: labopia, cocainscrnur, labbenz, amphetmu, thcu, labbarb    Alcohol Level: No results found for this basename: ETH,  in the last 72 hours Urinalysis:  Recent Labs  05/20/13 1457  COLORURINE YELLOW  LABSPEC 1.010  PHURINE 7.0  GLUCOSEU NEGATIVE  HGBUR TRACE*  BILIRUBINUR NEGATIVE  KETONESUR NEGATIVE  PROTEINUR NEGATIVE  UROBILINOGEN 2.0*  NITRITE NEGATIVE  LEUKOCYTESUR MODERATE*   Misc. Labs:  ABGS No results found for this basename: PHART, PCO2, PO2ART, TCO2, HCO3,  in the last 72 hours CULTURES Recent Results (from the past 240 hour(s))  MRSA PCR SCREENING     Status: Abnormal   Collection Time    05/20/13  9:54 PM      Result Value Range Status  MRSA by PCR POSITIVE (*) NEGATIVE Final   Comment:            The GeneXpert MRSA Assay (FDA     approved for NASAL specimens     only), is one component of a     comprehensive MRSA colonization     surveillance program. It is not     intended to diagnose MRSA     infection nor to guide or     monitor treatment for     MRSA infections.     RESULT CALLED TO, READ BACK BY AND VERIFIED WITH:     NEILSON,T @ 0200 ON 05/21/13 BY WOODIE,J   Studies/Results: US Abdomen Complete  05/20/2013   *RADIOLOGY REPORT*  Clinical Data:  Abdominal pain.  Nausea and vomiting.  Current history of abdominal aortic aneurysm.  COMPLETE ABDOMINAL  ULTRASOUND  Comparison:  Abdominal ultrasound 12/28/2012.  CT abdomen pelvis of that same date.  Findings:  Gallbladder:  Sludge ball in the gallbladder fundus measuring approximate 2.4 cm which did not move with patient motion.  No associated acoustic shadowing to suggest gallstones.  On the prior examination, the sludge was mobile.  Mild gallbladder wall thickening up to 4 mm.  Negative sonographic Murphy's sign according to the ultrasound technologist.  Common bile duct:  Normal in caliber with maximum diameter approximating 7-8  mm.  Liver:  No focal mass lesion seen.  Within normal limits in parenchymal echogenicity.  IVC:  Patent.  Pancreas:  Although the pancreas is difficult to visualize in its entirety, no focal pancreatic abnormality is identified.  Spleen:  Normal size and echotexture without focal parenchymal abnormality.  Right Kidney:  No hydronephrosis.  Relatively well-preserved cortex.  Mildly echogenic parenchyma.  Approximate 1.8 cm simple cyst in the upper pole and approximate 0.8 cm simple cyst in the lower pole, unchanged.  No significant focal parenchymal abnormality.  No visible shadowing calculi.  Approximately 10.7 cm in length.  Left Kidney:  No hydronephrosis.  Relatively well-preserved cortex. Mildly echogenic parenchyma.  No focal parenchymal abnormality.  No visible shadowing calculi.  Approximately 10.0 cm in length.  Abdominal aorta:  Infrarenal abdominal aortic aneurysm with a large amount of mural thrombus and plaque, maximum AP diameter approximating 5.7 cm in maximum transverse diameter approximating 6.8 cm not significantly changed since the prior examination by my measurements.  IMPRESSION:  1.  Non mobile sludge ball in the gallbladder fundus.  Mild gallbladder wall thickening up to 4 mm without sonographic Murphy's sign may indicate chronic cholecystitis.  2.  Infrarenal abdominal aortic aneurysm, maximum dimensions 5.7 x 6.8 cm, not significantly changed since the January,  2014 examination. 3.  No significant abnormalities otherwise.   Original Report Authenticated By: Hulan Saas, M.D.   Dg Abd Acute W/chest  05/20/2013   *RADIOLOGY REPORT*  Clinical Data: Abdominal pain.  Nausea and vomiting.  ACUTE ABDOMEN SERIES (ABDOMEN 2 VIEW & CHEST 1 VIEW)  Comparison: CT abdomen and pelvis 12/28/2012.  Two-view chest x-ray 12/27/2012.  Findings: Bowel gas pattern unremarkable without evidence of obstruction or significant ileus.  Moderate to large stool burden. Aorto-iliac atherosclerosis with lower abdominal aortic aneurysm as noted on prior CT measuring approximately 6.4 cm diameter uncorrected for radiographic magnification (5.2 cm on the prior CT).  Phleboliths in the left side of the pelvis.  No visible opaque urinary tract calculi.  Severe degenerative changes involving both hip joints with remodeling of the left hip joint.  Cardiac silhouette normal in size, unchanged.  Thoracic aorta atherosclerotic, unchanged.  Hilar and mediastinal contours otherwise unremarkable.  Stable minimal scarring at the left lung base.  Lungs otherwise clear.  Cervicothoracic scoliosis convex left and thoracic scoliosis convex right.  IMPRESSION:  1.  No acute abdominal abnormality. 2.  Minimal scarring at the left lung base.  No acute cardiopulmonary disease.   Original Report Authenticated By: Hulan Saas, M.D.    Medications:  Prior to Admission:  Prescriptions prior to admission  Medication Sig Dispense Refill  . ALPRAZolam (XANAX) 0.5 MG tablet Take 0.5 mg by mouth every 8 (eight) hours as needed.      . Choline Fenofibrate (FENOFIBRIC ACID) 135 MG CPDR Take 1 tablet by mouth daily.      Marland Kitchen diltiazem (TIAZAC) 240 MG 24 hr capsule Take 240 mg by mouth daily.      . divalproex (DEPAKOTE SPRINKLE) 125 MG capsule Take 125-250 mg by mouth 2 (two) times daily. Take two capsules in the morning and take one capsule at bedtime      . furosemide (LASIX) 40 MG tablet Take 40 mg by mouth  daily.      Marland Kitchen HYDROcodone-acetaminophen (NORCO) 10-325 MG per tablet Take 1 tablet by mouth 3 (three) times daily as needed.      . mirtazapine (REMERON) 15 MG tablet Take 15 mg by mouth at bedtime.      . nebivolol (BYSTOLIC) 5 MG tablet Take 5 mg by mouth daily.      Marland Kitchen omeprazole (PRILOSEC) 20 MG capsule Take 20 mg by mouth daily.      . rosuvastatin (CRESTOR) 10 MG tablet Take 10 mg by mouth at bedtime.      Marland Kitchen tiotropium (SPIRIVA) 18 MCG inhalation capsule Place 18 mcg into inhaler and inhale daily.      . traMADol (ULTRAM) 50 MG tablet Take 50 mg by mouth every morning.       Marland Kitchen acetaminophen (Q-PAP) 500 MG tablet Take 500 mg by mouth every 4 (four) hours as needed. For pain      . albuterol (PROVENTIL) (2.5 MG/3ML) 0.083% nebulizer solution Take 2.5 mg by nebulization every 6 (six) hours as needed. For shortness of breath       Scheduled: . acidophilus  2 capsule Oral Daily  . Chlorhexidine Gluconate Cloth  6 each Topical Q0600  . ciprofloxacin  400 mg Intravenous Q12H  . diltiazem  240 mg Oral Daily  . enoxaparin (LOVENOX) injection  40 mg Subcutaneous Q24H  . famotidine (PEPCID) IV  20 mg Intravenous Q24H  . mupirocin ointment  1 application Nasal BID  . piperacillin-tazobactam (ZOSYN)  IV  3.375 g Intravenous Q8H   Continuous: . 0.9 % NaCl with KCl 20 mEq / L 100 mL/hr at 05/21/13 2227   ZOX:[id number], albuterol, ipratropium, ondansetron (ZOFRAN) IV, traZODone  Assesment: She was admitted with abdominal pain nausea and vomiting. That had improved but she had more trouble yesterday and this morning. Her ultrasound shows what may be chronic cholecystitis. She has what appears to be a urinary tract infection. She has multiple other medical problems including COPD and a very large abdominal aortic aneurysm. Active Problems:   COPD (chronic obstructive pulmonary disease)   AAA (abdominal aortic aneurysm)   Abdominal pain   Nausea and vomiting   UTI (urinary tract infection)    Renal insufficiency    Plan: I'm going to have her get a abdominal film to make sure she doesn't look like she has obstruction. I'm going to modify  her antibiotics. I will ask for surgical consultation.    LOS: 2 days   [name] [name] L 05/22/2013, 9:43 AM

## 2013-05-22 NOTE — Consult Note (Signed)
Reason for Consult: Abdominal pain Referring Physician: Dr. Hawkins  [name] [name] is an 78 y.o. female.  HPI: Patient presented to [facility] with diffuse abdominal pain. She describes this is relatively constant. It is been increasing over the last 1-2 weeks. She denies any exacerbating or relieving features. She has had some diarrhea but no change in symptomatology. No melena or hematochezia. No similar symptomatology in the past. D. to patient's hearing impairment is somewhat difficult to discern specifics regarding her past medical history. She understands she has an abdominal aortic aneurysm which is closely being monitored by her vascular surgeon in [place]. There is no immediate plans for repair. She denies any difficulties with fatty greasy foods although states she has not been hungry at all over the last several weeks. No prior issues of fatty greasy foods. No history of jaundice. No sick contacts. No family history of biliary disease.  Past Medical History  Diagnosis Date  . Hypertension   . Hyperlipidemia   . COPD (chronic obstructive pulmonary disease)     multiple hospitalizations for exacerbations with acute bronchitis  . Degenerative joint disease     Bilateral hips  . Depression with anxiety   . GERD (gastroesophageal reflux disease)     Hiatal hernia  . Atrial arrhythmia     Atrial fibrillation and atrial flutter diagnosed in the past;?  PSVT  . AAA (abdominal aortic aneurysm)     infrarenal; 07/2012: Diameter of 5.4 cm  . Arteriosclerotic cardiovascular disease (ASCVD)     Cardiac cath in 03/2000->  80% LAD, 70-80% RCA;  stent x1 in the LAD and x2 in the RCA with excellent angiographic outcome; repeat catheterization in late 2001 and 2003 revealed no restenosis; EF-50%  . Tobacco abuse, in remission     Quit in 2006; total consumption of 50-75 pack years  . Pituitary microadenoma     transsphenoidal resection in 09/2007  . Anemia   . Coronary artery  disease   . PVD (peripheral vascular disease)   . Gall stones   . Hypokalemia   . Ileus   . SBO (small bowel obstruction)   . Hypernatremia   . Nephrolithiasis   . Renal failure   . DJD (degenerative joint disease)   . Encephalopathy     Past Surgical History  Procedure Laterality Date  . Kidney stone surgery    . Transphenoidal / transnasal hypophysectomy / resection pituitary tumor  09/2007  . Cardiac catheterization  2001    post PTCA and stenting of her LAD and RCA    Family History  Problem Relation Age of Onset  . Heart disease Mother   . Hyperlipidemia Mother   . Hypertension Mother   . Stroke Mother   . Cancer Brother     Social History:  reports that she quit smoking about 7 years ago. Her smoking use included Cigarettes. She has a 75 pack-year smoking history. She quit smokeless tobacco use about 5 years ago. She reports that she does not drink alcohol or use illicit drugs.  Allergies:  Allergies  Allergen Reactions  . Asa (Aspirin) Other (See Comments)    "my ears bleed"  . Lidocaine Other (See Comments)    unknown  . Nsaids Other (See Comments)    unknown  . Sulfonamide Derivatives Other (See Comments)    unknown    Medications:  I have reviewed the patient's current medications. Prior to Admission:  Prescriptions prior to admission  Medication Sig Dispense Refill  .  ALPRAZolam (XANAX) 0.5 MG tablet Take 0.5 mg by mouth every 8 (eight) hours as needed.      . Choline Fenofibrate (FENOFIBRIC ACID) 135 MG CPDR Take 1 tablet by mouth daily.      Marland Kitchen diltiazem (TIAZAC) 240 MG 24 hr capsule Take 240 mg by mouth daily.      . divalproex (DEPAKOTE SPRINKLE) 125 MG capsule Take 125-250 mg by mouth 2 (two) times daily. Take two capsules in the morning and take one capsule at bedtime      . furosemide (LASIX) 40 MG tablet Take 40 mg by mouth daily.      Marland Kitchen HYDROcodone-acetaminophen (NORCO) 10-325 MG per tablet Take 1 tablet by mouth 3 (three) times daily as  needed.      . mirtazapine (REMERON) 15 MG tablet Take 15 mg by mouth at bedtime.      . nebivolol (BYSTOLIC) 5 MG tablet Take 5 mg by mouth daily.      Marland Kitchen omeprazole (PRILOSEC) 20 MG capsule Take 20 mg by mouth daily.      . rosuvastatin (CRESTOR) 10 MG tablet Take 10 mg by mouth at bedtime.      Marland Kitchen tiotropium (SPIRIVA) 18 MCG inhalation capsule Place 18 mcg into inhaler and inhale daily.      . traMADol (ULTRAM) 50 MG tablet Take 50 mg by mouth every morning.       Marland Kitchen acetaminophen (Q-PAP) 500 MG tablet Take 500 mg by mouth every 4 (four) hours as needed. For pain      . albuterol (PROVENTIL) (2.5 MG/3ML) 0.083% nebulizer solution Take 2.5 mg by nebulization every 6 (six) hours as needed. For shortness of breath       Scheduled: . acidophilus  2 capsule Oral Daily  . Chlorhexidine Gluconate Cloth  6 each Topical Q0600  . ciprofloxacin  400 mg Intravenous Q12H  . diltiazem  240 mg Oral Daily  . enoxaparin (LOVENOX) injection  40 mg Subcutaneous Q24H  . famotidine (PEPCID) IV  20 mg Intravenous Q24H  . mupirocin ointment  1 application Nasal BID  . piperacillin-tazobactam (ZOSYN)  IV  3.375 g Intravenous Q8H   Continuous: . 0.9 % NaCl with KCl 20 mEq / L 100 mL/hr at 05/21/13 2227   RUE:[id number], albuterol, ipratropium, ondansetron (ZOFRAN) IV, traZODone Anti-infectives   Start     Dose/Rate Route Frequency Ordered Stop   05/22/13 1200  piperacillin-tazobactam (ZOSYN) IVPB 3.375 g     3.375 g 12.5 mL/hr over 240 Minutes Intravenous Every 8 hours 05/22/13 0939     05/22/13 1000  ciprofloxacin (CIPRO) IVPB 400 mg     400 mg 200 mL/hr over 60 Minutes Intravenous Every 12 hours 05/22/13 0929     05/21/13 1700  cefTRIAXone (ROCEPHIN) 1 g in dextrose 5 % 50 mL IVPB  Status:  Discontinued     1 g 100 mL/hr over 30 Minutes Intravenous Every 24 hours 05/20/13 2248 05/22/13 0929   05/20/13 1800  cefTRIAXone (ROCEPHIN) 1 g in dextrose 5 % 50 mL IVPB     1 g 100 mL/hr over 30 Minutes  Intravenous  Once 05/20/13 1759 05/20/13 1850      Results for orders placed during the hospital encounter of 05/20/13 (from the past 48 hour(s))  URINALYSIS, ROUTINE W REFLEX MICROSCOPIC     Status: Abnormal   Collection Time    05/20/13  2:57 PM      Result Value Range   Color, Urine YELLOW  YELLOW  APPearance HAZY (*) CLEAR   Specific Gravity, Urine 1.010  1.005 - 1.030   pH 7.0  5.0 - 8.0   Glucose, UA NEGATIVE  NEGATIVE mg/dL   Hgb urine dipstick TRACE (*) NEGATIVE   Bilirubin Urine NEGATIVE  NEGATIVE   Ketones, ur NEGATIVE  NEGATIVE mg/dL   Protein, ur NEGATIVE  NEGATIVE mg/dL   Urobilinogen, UA 2.0 (*) 0.0 - 1.0 mg/dL   Nitrite NEGATIVE  NEGATIVE   Leukocytes, UA MODERATE (*) NEGATIVE  URINE CULTURE     Status: None   Collection Time    05/20/13  2:57 PM      Result Value Range   Specimen Description URINE, CLEAN CATCH     Special Requests NONE     Culture  Setup Time 05/21/2013 00:11     Colony Count PENDING     Culture Culture reincubated for better growth     Report Status PENDING    URINE MICROSCOPIC-ADD ON     Status: Abnormal   Collection Time    05/20/13  2:57 PM      Result Value Range   WBC, UA TOO NUMEROUS TO COUNT  <3 WBC/hpf   RBC / HPF 3-6  <3 RBC/hpf   Bacteria, UA MANY (*) RARE  CBC WITH DIFFERENTIAL     Status: Abnormal   Collection Time    05/20/13  3:00 PM      Result Value Range   WBC 10.1  4.0 - 10.5 K/uL   RBC 4.44  3.87 - 5.11 MIL/uL   Hemoglobin 12.8  12.0 - 15.0 g/dL   HCT 16.1  09.6 - 04.5 %   MCV 89.6  78.0 - 100.0 fL   MCH 28.8  26.0 - 34.0 pg   MCHC 32.2  30.0 - 36.0 g/dL   RDW 40.9 (*) 81.1 - 91.4 %   Platelets 229  150 - 400 K/uL   Neutrophils Relative % 63  43 - 77 %   Neutro Abs 6.4  1.7 - 7.7 K/uL   Lymphocytes Relative 26  12 - 46 %   Lymphs Abs 2.6  0.7 - 4.0 K/uL   Monocytes Relative 11  3 - 12 %   Monocytes Absolute 1.1 (*) 0.1 - 1.0 K/uL   Eosinophils Relative 1  0 - 5 %   Eosinophils Absolute 0.1  0.0 - 0.7 K/uL    Basophils Relative 0  0 - 1 %   Basophils Absolute 0.0  0.0 - 0.1 K/uL  COMPREHENSIVE METABOLIC PANEL     Status: Abnormal   Collection Time    05/20/13  3:00 PM      Result Value Range   Sodium 145  135 - 145 mEq/L   Potassium 3.6  3.5 - 5.1 mEq/L   Chloride 105  96 - 112 mEq/L   CO2 29  19 - 32 mEq/L   Glucose, Bld 99  70 - 99 mg/dL   BUN 27 (*) 6 - 23 mg/dL   Creatinine, Ser 7.82 (*) 0.50 - 1.10 mg/dL   Calcium 8.5  8.4 - 95.6 mg/dL   Total Protein 6.8  6.0 - 8.3 g/dL   Albumin 2.5 (*) 3.5 - 5.2 g/dL   AST 60 (*) 0 - 37 U/L   ALT 30  0 - 35 U/L   Alkaline Phosphatase 122 (*) 39 - 117 U/L   Total Bilirubin 0.6  0.3 - 1.2 mg/dL   GFR calc non Af Amer 35 (*) >90  mL/min   GFR calc Af Amer 41 (*) >90 mL/min   Comment:            The eGFR has been calculated     using the CKD EPI equation.     This calculation has not been     validated in all clinical     situations.     eGFR's persistently     <90 mL/min signify     possible Chronic Kidney Disease.  LIPASE, BLOOD     Status: Abnormal   Collection Time    05/20/13  3:00 PM      Result Value Range   Lipase 72 (*) 11 - 59 U/L  LACTIC ACID, PLASMA     Status: None   Collection Time    05/20/13  3:00 PM      Result Value Range   Lactic Acid, Venous 1.8  0.5 - 2.2 mmol/L  TROPONIN I     Status: None   Collection Time    05/20/13  3:00 PM      Result Value Range   Troponin I <0.30  <0.30 ng/mL   Comment:            Due to the release kinetics of cTnI,     a negative result within the first hours     of the onset of symptoms does not rule out     myocardial infarction with certainty.     If myocardial infarction is still suspected,     repeat the test at appropriate intervals.  VALPROIC ACID LEVEL     Status: Abnormal   Collection Time    05/20/13  3:00 PM      Result Value Range   Valproic Acid Lvl 12.1 (*) 50.0 - 100.0 ug/mL  MRSA PCR SCREENING     Status: Abnormal   Collection Time    05/20/13  9:54 PM       Result Value Range   MRSA by PCR POSITIVE (*) NEGATIVE   Comment:            The GeneXpert MRSA Assay (FDA     approved for NASAL specimens     only), is one component of a     comprehensive MRSA colonization     surveillance program. It is not     intended to diagnose MRSA     infection nor to guide or     monitor treatment for     MRSA infections.     RESULT CALLED TO, READ BACK BY AND VERIFIED WITH:     NEILSON,T @ 0200 ON 05/21/13 BY WOODIE,J  CBC     Status: Abnormal   Collection Time    05/21/13  6:47 AM      Result Value Range   WBC 8.8  4.0 - 10.5 K/uL   RBC 4.37  3.87 - 5.11 MIL/uL   Hemoglobin 12.6  12.0 - 15.0 g/dL   HCT 16.1  09.6 - 04.5 %   MCV 87.9  78.0 - 100.0 fL   MCH 28.8  26.0 - 34.0 pg   MCHC 32.8  30.0 - 36.0 g/dL   RDW 40.9 (*) 81.1 - 91.4 %   Platelets 186  150 - 400 K/uL  COMPREHENSIVE METABOLIC PANEL     Status: Abnormal   Collection Time    05/21/13  6:47 AM      Result Value Range   Sodium 144  135 - 145 mEq/L  Potassium 3.6  3.5 - 5.1 mEq/L   Chloride 110  96 - 112 mEq/L   CO2 27  19 - 32 mEq/L   Glucose, Bld 81  70 - 99 mg/dL   BUN 20  6 - 23 mg/dL   Creatinine, Ser 1.61 (*) 0.50 - 1.10 mg/dL   Calcium 8.4  8.4 - 09.6 mg/dL   Total Protein 5.6 (*) 6.0 - 8.3 g/dL   Albumin 2.1 (*) 3.5 - 5.2 g/dL   AST 53 (*) 0 - 37 U/L   ALT 28  0 - 35 U/L   Alkaline Phosphatase 92  39 - 117 U/L   Total Bilirubin 0.6  0.3 - 1.2 mg/dL   GFR calc non Af Amer 45 (*) >90 mL/min   GFR calc Af Amer 53 (*) >90 mL/min   Comment:            The eGFR has been calculated     using the CKD EPI equation.     This calculation has not been     validated in all clinical     situations.     eGFR's persistently     <90 mL/min signify     possible Chronic Kidney Disease.  HEMOGLOBIN A1C     Status: None   Collection Time    05/21/13  6:47 AM      Result Value Range   Hemoglobin A1C 5.4  <5.7 %   Comment: (NOTE)                                                                                According to the ADA Clinical Practice Recommendations for 2011, when     HbA1c is used as a screening test:      >=6.5%   Diagnostic of Diabetes Mellitus               (if abnormal result is confirmed)     5.7-6.4%   Increased risk of developing Diabetes Mellitus     References:Diagnosis and Classification of Diabetes Mellitus,Diabetes     Care,2011,34(Suppl 1):S62-S69 and Standards of Medical Care in             Diabetes - 2011,Diabetes Care,2011,34 (Suppl 1):S11-S61.   Mean Plasma Glucose 108  <117 mg/dL  TSH     Status: None   Collection Time    05/21/13  6:47 AM      Result Value Range   TSH 2.506  0.350 - 4.500 uIU/mL    Dg Abd 1 View  05/22/2013   *RADIOLOGY REPORT*  Clinical Data: Vomiting.  ABDOMEN - 1 VIEW  Comparison: CT 12/28/2012.  Plain film 05/20/2013  Findings: Extensive clothing artifact over the abdomen and pelvis. No gross free intraperitoneal air on the supine image.  No significant bowel distention.  Gas within normal caliber stomach and colon.  Distal stool and gas.  Extensive vascular calcifications.  Infrarenal abdominal aortic dilatation.  Far left abdomen excluded.  Severe bilateral hip osteoarthritis with a component of left-sided developmental dysplasia.  IMPRESSION: Although the exam is degraded by positioning and overlying artifact, no explanation for vomiting is seen.   Original Report Authenticated By: Jeronimo Greaves,  M.D.   US Abdomen Complete  05/20/2013   *RADIOLOGY REPORT*  Clinical Data:  Abdominal pain.  Nausea and vomiting.  Current history of abdominal aortic aneurysm.  COMPLETE ABDOMINAL ULTRASOUND  Comparison:  Abdominal ultrasound 12/28/2012.  CT abdomen pelvis of that same date.  Findings:  Gallbladder:  Sludge ball in the gallbladder fundus measuring approximate 2.4 cm which did not move with patient motion.  No associated acoustic shadowing to suggest gallstones.  On the prior examination, the sludge was mobile.  Mild gallbladder  wall thickening up to 4 mm.  Negative sonographic Murphy's sign according to the ultrasound technologist.  Common bile duct:  Normal in caliber with maximum diameter approximating 7-8  mm.  Liver:  No focal mass lesion seen.  Within normal limits in parenchymal echogenicity.  IVC:  Patent.  Pancreas:  Although the pancreas is difficult to visualize in its entirety, no focal pancreatic abnormality is identified.  Spleen:  Normal size and echotexture without focal parenchymal abnormality.  Right Kidney:  No hydronephrosis.  Relatively well-preserved cortex.  Mildly echogenic parenchyma.  Approximate 1.8 cm simple cyst in the upper pole and approximate 0.8 cm simple cyst in the lower pole, unchanged.  No significant focal parenchymal abnormality.  No visible shadowing calculi.  Approximately 10.7 cm in length.  Left Kidney:  No hydronephrosis.  Relatively well-preserved cortex. Mildly echogenic parenchyma.  No focal parenchymal abnormality.  No visible shadowing calculi.  Approximately 10.0 cm in length.  Abdominal aorta:  Infrarenal abdominal aortic aneurysm with a large amount of mural thrombus and plaque, maximum AP diameter approximating 5.7 cm in maximum transverse diameter approximating 6.8 cm not significantly changed since the prior examination by my measurements.  IMPRESSION:  1.  Non mobile sludge ball in the gallbladder fundus.  Mild gallbladder wall thickening up to 4 mm without sonographic Murphy's sign may indicate chronic cholecystitis.  2.  Infrarenal abdominal aortic aneurysm, maximum dimensions 5.7 x 6.8 cm, not significantly changed since the January, 2014 examination. 3.  No significant abnormalities otherwise.   Original Report Authenticated By: Hulan Saas, M.D.   Dg Abd Acute W/chest  05/20/2013   *RADIOLOGY REPORT*  Clinical Data: Abdominal pain.  Nausea and vomiting.  ACUTE ABDOMEN SERIES (ABDOMEN 2 VIEW & CHEST 1 VIEW)  Comparison: CT abdomen and pelvis 12/28/2012.  Two-view chest x-ray  12/27/2012.  Findings: Bowel gas pattern unremarkable without evidence of obstruction or significant ileus.  Moderate to large stool burden. Aorto-iliac atherosclerosis with lower abdominal aortic aneurysm as noted on prior CT measuring approximately 6.4 cm diameter uncorrected for radiographic magnification (5.2 cm on the prior CT).  Phleboliths in the left side of the pelvis.  No visible opaque urinary tract calculi.  Severe degenerative changes involving both hip joints with remodeling of the left hip joint.  Cardiac silhouette normal in size, unchanged.  Thoracic aorta atherosclerotic, unchanged.  Hilar and mediastinal contours otherwise unremarkable.  Stable minimal scarring at the left lung base.  Lungs otherwise clear.  Cervicothoracic scoliosis convex left and thoracic scoliosis convex right.  IMPRESSION:  1.  No acute abdominal abnormality. 2.  Minimal scarring at the left lung base.  No acute cardiopulmonary disease.   Original Report Authenticated By: Hulan Saas, M.D.    Review of Systems  Constitutional: Negative.   HENT: Negative.   Eyes: Negative.   Respiratory: Positive for shortness of breath.   Cardiovascular: Negative.   Gastrointestinal: Positive for nausea, vomiting, abdominal pain (diffuse) and diarrhea. Negative for constipation, blood in stool  and melena.  Genitourinary: Negative.   Musculoskeletal: Negative.   Skin: Negative.   Neurological: Negative.   Endo/Heme/Allergies: Negative.   Psychiatric/Behavioral: Negative.    Blood pressure 145/82, pulse 78, temperature 98.5 F (36.9 C), temperature source Oral, resp. rate 20, height 5\' 6"  (1.676 m), weight 70.3 kg (154 lb 15.7 oz), SpO2 100.00%. Physical Exam  Constitutional: She appears well-developed and well-nourished. No distress.  Elderly  HENT:  Head: Normocephalic and atraumatic.  Hard of hearing  Eyes: Conjunctivae and EOM are normal. Pupils are equal, round, and reactive to light. No scleral icterus.   Neck: Normal range of motion. Neck supple.  Cardiovascular: Normal rate and regular rhythm.   Respiratory: Effort normal and breath sounds normal. No respiratory distress.  GI: Soft. Bowel sounds are normal. She exhibits no distension and no mass. There is tenderness (right lower quadrant pain with some diffuse tenderness. No peritoneal signs). There is no rebound and no guarding.  Lymphadenopathy:    She has no cervical adenopathy.  Neurological: She is alert.  Skin: Skin is warm.    Assessment/Plan: Abdominal pain. Mostly right lower quadrant. Given her exam and her current course again I suspect appendiceal etiology. I am also very reluctant to say her symptoms would be related to a biliary etiology given her story and current evaluation. While there does appear to be some sludge on her right upper quadrant ultrasound her symptomatology and evaluation does not consistent with acute cholecystitis. Given her other medical issues and problems I would be reluctant to rush her to the operating room for a cholecystectomy at this time. I would continue to monitor her CBC and white blood cell count. Continue monitoring and will continue to follow her course however this time I do not find any acute surgical indications.  Mister Krahenbuhl C 05/22/2013, 2:25 PM

## 2013-05-22 NOTE — Progress Notes (Signed)
ANTIBIOTIC CONSULT NOTE - INITIAL  Pharmacy Consult for Zoysn Indication: chloecystitis vs UTI  Allergies  Allergen Reactions  . Asa (Aspirin) Other (See Comments)    "my ears bleed"  . Lidocaine Other (See Comments)    unknown  . Nsaids Other (See Comments)    unknown  . Sulfonamide Derivatives Other (See Comments)    unknown    Patient Measurements: Height: 5\' 6"  (167.6 cm) Weight: 154 lb 15.7 oz (70.3 kg) IBW/kg (Calculated) : 59.3  Vital Signs: Temp: 98.5 F (36.9 C) (06/01 1034) Temp src: Oral (06/01 1034) BP: 145/82 mmHg (06/01 1034) Pulse Rate: 78 (06/01 1034) Intake/Output from previous day: 05/31 0701 - 06/01 0700 In: 2267.7 [P.O.:420; I.V.:1747.7; IV Piggyback:100] Out: 400 [Urine:400] Intake/Output from this shift:    Labs:  Recent Labs  05/20/13 1500 05/21/13 0647  WBC 10.1 8.8  HGB 12.8 12.6  PLT 229 186  CREATININE 1.41* 1.15*   Estimated Creatinine Clearance: 39.6 ml/min (by C-G formula based on Cr of 1.15). No results found for this basename: VANCOTROUGH, Leodis Binet, VANCORANDOM, GENTTROUGH, GENTPEAK, GENTRANDOM, TOBRATROUGH, TOBRAPEAK, TOBRARND, AMIKACINPEAK, AMIKACINTROU, AMIKACIN,  in the last 72 hours   Microbiology: Recent Results (from the past 720 hour(s))  MRSA PCR SCREENING     Status: Abnormal   Collection Time    05/20/13  9:54 PM      Result Value Range Status   MRSA by PCR POSITIVE (*) NEGATIVE Final   Comment:            The GeneXpert MRSA Assay (FDA     approved for NASAL specimens     only), is one component of a     comprehensive MRSA colonization     surveillance program. It is not     intended to diagnose MRSA     infection nor to guide or     monitor treatment for     MRSA infections.     RESULT CALLED TO, READ BACK BY AND VERIFIED WITH:     NEILSON,T @ 0200 ON 05/21/13 BY WOODIE,J    Medical History: Past Medical History  Diagnosis Date  . Hypertension   . Hyperlipidemia   . COPD (chronic obstructive  pulmonary disease)     multiple hospitalizations for exacerbations with acute bronchitis  . Degenerative joint disease     Bilateral hips  . Depression with anxiety   . GERD (gastroesophageal reflux disease)     Hiatal hernia  . Atrial arrhythmia     Atrial fibrillation and atrial flutter diagnosed in the past;?  PSVT  . AAA (abdominal aortic aneurysm)     infrarenal; 07/2012: Diameter of 5.4 cm  . Arteriosclerotic cardiovascular disease (ASCVD)     Cardiac cath in 03/2000->  80% LAD, 70-80% RCA;  stent x1 in the LAD and x2 in the RCA with excellent angiographic outcome; repeat catheterization in late 2001 and 2003 revealed no restenosis; EF-50%  . Tobacco abuse, in remission     Quit in 2006; total consumption of 50-75 pack years  . Pituitary microadenoma     transsphenoidal resection in 09/2007  . Anemia   . Coronary artery disease   . PVD (peripheral vascular disease)   . Gall stones   . Hypokalemia   . Ileus   . SBO (small bowel obstruction)   . Hypernatremia   . Nephrolithiasis   . Renal failure   . DJD (degenerative joint disease)   . Encephalopathy     Medications:  Scheduled:  .  acidophilus  2 capsule Oral Daily  . Chlorhexidine Gluconate Cloth  6 each Topical Q0600  . ciprofloxacin  400 mg Intravenous Q12H  . diltiazem  240 mg Oral Daily  . enoxaparin (LOVENOX) injection  40 mg Subcutaneous Q24H  . famotidine (PEPCID) IV  20 mg Intravenous Q24H  . mupirocin ointment  1 application Nasal BID  . piperacillin-tazobactam (ZOSYN)  IV  3.375 g Intravenous Q8H   Assessment: 78 yo F admitted with abdominal pain and nausea/vomiting.  She was initially started on Rocephin for UTI but antibiotic coverage is now being broadened for cholecystitis.  Renal function is at patient's baseline.   Rocephin 5/31>>6/1 Zoysn 6/1>> Cipro 6/1>>  Goal of Therapy:  Eradicate infection.  Plan:  Zosyn 3.375gm IV Q8h to be infused over 4hrs Monitor renal function and cx data  **Does  patient need duplicate gram negative coverage?  Consider d/c Cipro.   Elson Clan 05/22/2013,10:56 AM

## 2013-05-23 LAB — BASIC METABOLIC PANEL
BUN: 13 mg/dL (ref 6–23)
Chloride: 104 mEq/L (ref 96–112)
Creatinine, Ser: 1.12 mg/dL — ABNORMAL HIGH (ref 0.50–1.10)
GFR calc non Af Amer: 47 mL/min — ABNORMAL LOW (ref >90)
Glucose, Bld: 117 mg/dL — ABNORMAL HIGH (ref 70–99)
Potassium: 4.4 mEq/L (ref 3.5–5.1)

## 2013-05-23 LAB — CBC
HCT: 36.7 % (ref 36.0–46.0)
Hemoglobin: 11.8 g/dL — ABNORMAL LOW (ref 12.0–15.0)
MCHC: 32.2 g/dL (ref 30.0–36.0)
MCV: 88.9 fL (ref 78.0–100.0)

## 2013-05-23 NOTE — Progress Notes (Signed)
Subjective: Pain still persists. Appetite remains poor  Objective: Vital signs in last 24 hours: Temp:  [98 F (36.7 C)-98.4 F (36.9 C)] 98 F (36.7 C) (06/02 0518) Pulse Rate:  [64-86] 64 (06/02 0518) Resp:  [20] 20 (06/02 0518) BP: (113-138)/(58-78) 113/58 mmHg (06/02 0518) SpO2:  [97 %-98 %] 97 % (06/02 0518) Weight:  [68.3 kg (150 lb 9.2 oz)] 68.3 kg (150 lb 9.2 oz) (06/02 0518) Last BM Date: 05/21/13  Intake/Output from previous day: 06/01 0701 - 06/02 0700 In: 3120.3 [P.O.:505; I.V.:2015.3; IV Piggyback:600] Out: 450 [Urine:450] Intake/Output this shift:    General appearance: no distress GI: Quiet, soft, moderate to severe right lower quadrant abdominal wall tenderness. Mild diffuse abdominal tenderness. No classic Murphy sign. No rebound tenderness. No: Carried or involuntary guarding. No referred pain.  Lab Results:   Recent Labs  05/20/13 1500 05/21/13 0647  WBC 10.1 8.8  HGB 12.8 12.6  HCT 39.8 38.4  PLT 229 186   BMET  Recent Labs  05/20/13 1500 05/21/13 0647  NA 145 144  K 3.6 3.6  CL 105 110  CO2 29 27  GLUCOSE 99 81  BUN 27* 20  CREATININE 1.41* 1.15*  CALCIUM 8.5 8.4   PT/INR No results found for this basename: LABPROT, INR,  in the last 72 hours ABG No results found for this basename: PHART, PCO2, PO2, HCO3,  in the last 72 hours  Studies/Results: Dg Abd 1 View  05/22/2013   *RADIOLOGY REPORT*  Clinical Data: Vomiting.  ABDOMEN - 1 VIEW  Comparison: CT 12/28/2012.  Plain film 05/20/2013  Findings: Extensive clothing artifact over the abdomen and pelvis. No gross free intraperitoneal air on the supine image.  No significant bowel distention.  Gas within normal caliber stomach and colon.  Distal stool and gas.  Extensive vascular calcifications.  Infrarenal abdominal aortic dilatation.  Far left abdomen excluded.  Severe bilateral hip osteoarthritis with a component of left-sided developmental dysplasia.  IMPRESSION: Although the exam is  degraded by positioning and overlying artifact, no explanation for vomiting is seen.   Original Report Authenticated By: Jeronimo Greaves, M.D.    Anti-infectives: Anti-infectives   Start     Dose/Rate Route Frequency Ordered Stop   05/22/13 1200  piperacillin-tazobactam (ZOSYN) IVPB 3.375 g     3.375 g 12.5 mL/hr over 240 Minutes Intravenous Every 8 hours 05/22/13 0939     05/22/13 1000  ciprofloxacin (CIPRO) IVPB 400 mg     400 mg 200 mL/hr over 60 Minutes Intravenous Every 12 hours 05/22/13 0929     05/21/13 1700  cefTRIAXone (ROCEPHIN) 1 g in dextrose 5 % 50 mL IVPB  Status:  Discontinued     1 g 100 mL/hr over 30 Minutes Intravenous Every 24 hours 05/20/13 2248 05/22/13 0929   05/20/13 1800  cefTRIAXone (ROCEPHIN) 1 g in dextrose 5 % 50 mL IVPB     1 g 100 mL/hr over 30 Minutes Intravenous  Once 05/20/13 1759 05/20/13 1850      Assessment/Plan: s/p * No surgery found * Right lower quadrant abdominal pain, diffuse abdominal pain. Long discussion with the patient regarding her clinical findings. At this time I have an extremely low suspicion of a biliary etiology given the fact that most of her symptomatology is the right lower quadrant. While some referred pain may present lower still feel this is not likely biliary in etiology despite the sludge seen on her ultrasound of the right upper quadrant. I explained to her that she  is moderately high risk to proceed to the operating room given her other medical problems as well as her large aortic aneurysm. Have discussed with her that should her pain increase her white blood cell count increased at that time we would re\re entertain proceeding for an exploration however I do not feel that immediate surgical intervention is warranted. Will continue to follow her course closely  LOS: 3 days    Rykar Lebleu C 05/23/2013

## 2013-05-23 NOTE — Progress Notes (Signed)
Subjective: She says she feels a bit better. She is still having some nausea and abdominal pain. She has no other new complaints. Surgical evaluation is noted and appreciated  Objective: Vital signs in last 24 hours: Temp:  [98 F (36.7 C)-98.5 F (36.9 C)] 98 F (36.7 C) (06/02 0518) Pulse Rate:  [64-86] 64 (06/02 0518) Resp:  [20] 20 (06/02 0518) BP: (113-145)/(58-82) 113/58 mmHg (06/02 0518) SpO2:  [97 %-100 %] 97 % (06/02 0518) Weight:  [68.3 kg (150 lb 9.2 oz)] 68.3 kg (150 lb 9.2 oz) (06/02 0518) Weight change: -2 kg (-4 lb 6.5 oz) Last BM Date: 05/21/13  Intake/Output from previous day: 06/01 0701 - 06/02 0700 In: 3120.3 [P.O.:505; I.V.:2015.3; IV Piggyback:600] Out: 450 [Urine:450]  PHYSICAL EXAM General appearance: alert and mild distress Resp: clear to auscultation bilaterally Cardio: regular rate and rhythm, S1, S2 normal, no murmur, click, rub or gallop GI: Mildly diffusely tender Extremities: extremities normal, atraumatic, no cyanosis or edema  Lab Results:    Basic Metabolic Panel:  Recent Labs  16/10/96 1500 05/21/13 0647  NA 145 144  K 3.6 3.6  CL 105 110  CO2 29 27  GLUCOSE 99 81  BUN 27* 20  CREATININE 1.41* 1.15*  CALCIUM 8.5 8.4   Liver Function Tests:  Recent Labs  05/20/13 1500 05/21/13 0647  AST 60* 53*  ALT 30 28  ALKPHOS 122* 92  BILITOT 0.6 0.6  PROT 6.8 5.6*  ALBUMIN 2.5* 2.1*    Recent Labs  05/20/13 1500  LIPASE 72*   No results found for this basename: AMMONIA,  in the last 72 hours CBC:  Recent Labs  05/20/13 1500 05/21/13 0647  WBC 10.1 8.8  NEUTROABS 6.4  --   HGB 12.8 12.6  HCT 39.8 38.4  MCV 89.6 87.9  PLT 229 186   Cardiac Enzymes:  Recent Labs  05/20/13 1500  TROPONINI <0.30   BNP: No results found for this basename: PROBNP,  in the last 72 hours D-Dimer: No results found for this basename: DDIMER,  in the last 72 hours CBG: No results found for this basename: GLUCAP,  in the last 72  hours Hemoglobin A1C:  Recent Labs  05/21/13 0647  HGBA1C 5.4   Fasting Lipid Panel: No results found for this basename: CHOL, HDL, LDLCALC, TRIG, CHOLHDL, LDLDIRECT,  in the last 72 hours Thyroid Function Tests:  Recent Labs  05/21/13 0647  TSH 2.506   Anemia Panel: No results found for this basename: VITAMINB12, FOLATE, FERRITIN, TIBC, IRON, RETICCTPCT,  in the last 72 hours Coagulation: No results found for this basename: LABPROT, INR,  in the last 72 hours Urine Drug Screen: Drugs of Abuse  No results found for this basename: labopia, cocainscrnur, labbenz, amphetmu, thcu, labbarb    Alcohol Level: No results found for this basename: ETH,  in the last 72 hours Urinalysis:  Recent Labs  05/20/13 1457  COLORURINE YELLOW  LABSPEC 1.010  PHURINE 7.0  GLUCOSEU NEGATIVE  HGBUR TRACE*  BILIRUBINUR NEGATIVE  KETONESUR NEGATIVE  PROTEINUR NEGATIVE  UROBILINOGEN 2.0*  NITRITE NEGATIVE  LEUKOCYTESUR MODERATE*   Misc. Labs:  ABGS No results found for this basename: PHART, PCO2, PO2ART, TCO2, HCO3,  in the last 72 hours CULTURES Recent Results (from the past 240 hour(s))  URINE CULTURE     Status: None   Collection Time    05/20/13  2:57 PM      Result Value Range Status   Specimen Description URINE, CLEAN CATCH  Final   Special Requests NONE   Final   Culture  Setup Time 05/21/2013 00:11   Final   Colony Count PENDING   Incomplete   Culture Culture reincubated for better growth   Final   Report Status PENDING   Incomplete  MRSA PCR SCREENING     Status: Abnormal   Collection Time    05/20/13  9:54 PM      Result Value Range Status   MRSA by PCR POSITIVE (*) NEGATIVE Final   Comment:            The GeneXpert MRSA Assay (FDA     approved for NASAL specimens     only), is one component of a     comprehensive MRSA colonization     surveillance program. It is not     intended to diagnose MRSA     infection nor to guide or     monitor treatment for      MRSA infections.     RESULT CALLED TO, READ BACK BY AND VERIFIED WITH:     NEILSON,T @ 0200 ON 05/21/13 BY WOODIE,J   Studies/Results: Dg Abd 1 View  05/22/2013   *RADIOLOGY REPORT*  Clinical Data: Vomiting.  ABDOMEN - 1 VIEW  Comparison: CT 12/28/2012.  Plain film 05/20/2013  Findings: Extensive clothing artifact over the abdomen and pelvis. No gross free intraperitoneal air on the supine image.  No significant bowel distention.  Gas within normal caliber stomach and colon.  Distal stool and gas.  Extensive vascular calcifications.  Infrarenal abdominal aortic dilatation.  Far left abdomen excluded.  Severe bilateral hip osteoarthritis with a component of left-sided developmental dysplasia.  IMPRESSION: Although the exam is degraded by positioning and overlying artifact, no explanation for vomiting is seen.   Original Report Authenticated By: Jeronimo Greaves, M.D.    Medications:  Prior to Admission:  Prescriptions prior to admission  Medication Sig Dispense Refill  . ALPRAZolam (XANAX) 0.5 MG tablet Take 0.5 mg by mouth every 8 (eight) hours as needed.      . Choline Fenofibrate (FENOFIBRIC ACID) 135 MG CPDR Take 1 tablet by mouth daily.      Marland Kitchen diltiazem (TIAZAC) 240 MG 24 hr capsule Take 240 mg by mouth daily.      . divalproex (DEPAKOTE SPRINKLE) 125 MG capsule Take 125-250 mg by mouth 2 (two) times daily. Take two capsules in the morning and take one capsule at bedtime      . furosemide (LASIX) 40 MG tablet Take 40 mg by mouth daily.      Marland Kitchen HYDROcodone-acetaminophen (NORCO) 10-325 MG per tablet Take 1 tablet by mouth 3 (three) times daily as needed.      . mirtazapine (REMERON) 15 MG tablet Take 15 mg by mouth at bedtime.      . nebivolol (BYSTOLIC) 5 MG tablet Take 5 mg by mouth daily.      Marland Kitchen omeprazole (PRILOSEC) 20 MG capsule Take 20 mg by mouth daily.      . rosuvastatin (CRESTOR) 10 MG tablet Take 10 mg by mouth at bedtime.      Marland Kitchen tiotropium (SPIRIVA) 18 MCG inhalation capsule Place 18 mcg  into inhaler and inhale daily.      . traMADol (ULTRAM) 50 MG tablet Take 50 mg by mouth every morning.       Marland Kitchen acetaminophen (Q-PAP) 500 MG tablet Take 500 mg by mouth every 4 (four) hours as needed. For pain      .  albuterol (PROVENTIL) (2.5 MG/3ML) 0.083% nebulizer solution Take 2.5 mg by nebulization every 6 (six) hours as needed. For shortness of breath       Scheduled: . acidophilus  2 capsule Oral Daily  . Chlorhexidine Gluconate Cloth  6 each Topical Q0600  . ciprofloxacin  400 mg Intravenous Q12H  . diltiazem  240 mg Oral Daily  . enoxaparin (LOVENOX) injection  40 mg Subcutaneous Q24H  . famotidine (PEPCID) IV  20 mg Intravenous Q24H  . mupirocin ointment  1 application Nasal BID  . piperacillin-tazobactam (ZOSYN)  IV  3.375 g Intravenous Q8H   Continuous: . 0.9 % NaCl with KCl 20 mEq / L 100 mL/hr at 05/22/13 1729   ZOX:[id number], albuterol, ALPRAZolam, ipratropium, ondansetron (ZOFRAN) IV, traZODone  Assesment: She has abdominal pain thought to be related to her appendix. She does have sludge in the gallbladder. She has multiple other medical problems. She's not a good surgical candidate according to her cardiologist at least not for extensive surgery like abdominal aortic aneurysm repair Active Problems:   COPD (chronic obstructive pulmonary disease)   AAA (abdominal aortic aneurysm)   Abdominal pain   Nausea and vomiting   UTI (urinary tract infection)   Renal insufficiency    Plan: She will be treated medically and hopefully can avoid surgery    LOS: 3 days   Denelda Akerley L 05/23/2013, 8:49 AM

## 2013-05-23 NOTE — Clinical Social Work Placement (Signed)
Clinical Social Work Department CLINICAL SOCIAL WORK PLACEMENT NOTE 05/23/2013  Patient:  [name] [name], [name] [name]  Account Number:  [id number] Admit date:  05/20/2013  Clinical Social Worker:  Derenda Fennel, LCSW  Date/time:  05/23/2013 01:30 PM  Clinical Social Work is seeking post-discharge placement for this patient at the following level of care:   ASSISTED LIVING/REST HOME   (*CSW will update this form in Epic as items are completed)   05/23/2013  Patient/family provided with [facility] Department of Clinical Social Work's list of facilities offering this level of care within the geographic area requested by the patient (or if unable, by the patient's family).  05/23/2013  Patient/family informed of their freedom to choose among providers that offer the needed level of care, that participate in Medicare, Medicaid or managed care program needed by the patient, have an available bed and are willing to accept the patient.  05/23/2013  Patient/family informed of [facility]' ownership interest in [facility], as well as of the fact that they are under no obligation to receive care at this facility.  PASARR submitted to EDS on  PASARR number received from EDS on   FL2 transmitted to all facilities in geographic area requested by pt/family on  05/23/2013 FL2 transmitted to all facilities within larger geographic area on   Patient informed that his/her managed care company has contracts with or will negotiate with  certain facilities, including the following:     Patient/family informed of bed offers received:   Patient chooses bed at  Physician recommends and patient chooses bed at    Patient to be transferred to  on   Patient to be transferred to facility by   The following physician request were entered in Epic:   Additional Comments: Pt has existing pasarr number.  Derenda Fennel, [place] [phone]

## 2013-05-23 NOTE — Clinical Social Work Note (Signed)
Pt's daughter [name] returned call. She reports she lives nearby and checks on pt frequently. Lori at first said she wanted pt to return to [facility], but then requested that CSW initiate new bed search to see what is available. CSW will complete and follow up tomorrow. ALF list left in room for [facility].  Derenda Fennel, [place] [phone]

## 2013-05-23 NOTE — Clinical Social Work Psychosocial (Signed)
Clinical Social Work Department BRIEF PSYCHOSOCIAL ASSESSMENT 05/23/2013  Patient:  GIULIANA, HANDYSIDE     Account Number:  [id number]     Admit date:  05/20/2013  Clinical Social Worker:  Nancie Neas  Date/Time:  05/23/2013 12:20 PM  Referred by:  Physician  Date Referred:  05/23/2013 Referred for  ALF Placement   Other Referral:   Interview type:  Family Other interview type:   voicemail left for [employer]    PSYCHOSOCIAL DATA Living Status:  FACILITY Admitted from facility:  [facility] Level of care:  Assisted Living Primary support name:  [name] Primary support relationship to patient:  CHILD, ADULT Degree of support available:   supportive per facility    CURRENT CONCERNS Current Concerns  Post-Acute Placement   Other Concerns:    SOCIAL WORK ASSESSMENT / PLAN CSW attempted to meet with pt, but pt oriented to self only. Pt known to CSW from previous admission. CSW spoke with Darel Hong at [facility]. Pt has been resident at facility since November 2013. She requires assist with ADLs at [facility]. She can sometimes transfer to her wheelchair independently, depending on her confusion that day. Pt feeds herself. Pt's daughter, [name] is involved and supportive per Darel Hong. Okay for return. [facility]. CSW left voicemail for [facility] requesting return call in order to confirm d/c plan.   Assessment/plan status:  Psychosocial Support/Ongoing Assessment of Needs Other assessment/ plan:   Information/referral to community resources:   [facility]    PATIENT'S/FAMILY'S RESPONSE TO PLAN OF CARE: Pt unable to discuss plan of care at this time. Awaiting return call from pt's daughter.       Derenda Fennel, [place] [phone]

## 2013-05-23 NOTE — Clinical Documentation Improvement (Signed)
CKD DOCUMENTATION CLARIFICATION QUERY   THIS DOCUMENT IS NOT A PERMANENT PART OF THE MEDICAL RECORD  TO RESPOND TO THE THIS QUERY, FOLLOW THE INSTRUCTIONS BELOW:  1. If needed, update documentation for the patient's encounter via the notes activity.  2. Access this query again and click edit on the In [employer].  3. After updating, or not, click F2 to complete all highlighted (required) fields concerning your review. Select "additional documentation in the medical record" OR "no additional documentation provided".  4. Click Sign note button.  5. The deficiency will fall out of your In Basket *Please let us know if you are not able to complete this workflow by phone or e-mail (listed below).  Please update your documentation within the medical record to reflect your response to this query.                                                                                        05/23/13   Dear Dr. Gisselle Galvis/Associates,  In a better effort to capture your patient's severity of illness, reflect appropriate length of stay and utilization of resources, a review of the patient medical record has revealed the following indicators.   Based on your clinical judgment, please clarify and document in a progress note and/or discharge summary the clinical condition associated with the following supporting information: In responding to this query please exercise your independent judgment.  The fact that a query is asked, does not imply that any particular answer is desired or expected.  Please clarify renal status  Possible Clinical Conditions?   CKD Stage I -  GFR > OR = 90 CKD Stage II - GFR 60-80 CKD Stage III - GFR 30-59 CKD Stage IV - GFR 15-29 CKD Stage V - GFR < 15 ESRD (End Stage Renal Disease) Other condition_____________ Cannot Clinically determine   Supporting Information:  Risk Factors:  History of hypertension, CAD, tobacco abuse, & renal failure Advanced age Renal  insufficiency Nausea, vomiting, and dehydration 5/31 progress note "Renal Insufficiency"  Diagnostics:  BUN/CR/GFR 5/30 = 27/1.41/35 5/31 = 20/1.15/45 6/2 = 13/1.12/47  Treatment: H&P = "treatment hydration and followup renal function on " You may use possible, probable, or suspect with inpatient documentation. possible, probable, suspected diagnoses MUST be documented at the time of discharge  Reviewed: additional documentation in the medical record   Thank You,  Debora T Williams RN, MSN Clinical Documentation Specialist: Office# [phone] [facility] Information Management [place]

## 2013-05-23 NOTE — Plan of Care (Signed)
Problem: Phase I Progression Outcomes Goal: Tolerating diet Outcome: Progressing C/o nausea at times with meals.

## 2013-05-24 LAB — URINE CULTURE

## 2013-05-24 MED ORDER — ALPRAZOLAM 0.5 MG PO TABS
0.5000 mg | ORAL_TABLET | ORAL | Status: DC | PRN
  Administered 2013-05-24 – 2013-05-31 (×6): 0.5 mg via ORAL
  Filled 2013-05-24 (×5): qty 1

## 2013-05-24 NOTE — Care Management Note (Signed)
    Page 1 of 2   05/31/2013     1:57:01 PM   CARE MANAGEMENT NOTE 05/31/2013  Patient:  [name] [name], [name] [name]   Account Number:  [id number]  Date Initiated:  05/24/2013  Documentation initiated by:  Rosemary Holms  Subjective/Objective Assessment:   Pt admitted from [facility]. [name] [name], daughter is contact. Per CSW, daughter is looking for a different facility. Advancing diet. CSW assisting family     Action/Plan:   Anticipated DC Date:  05/31/2013   Anticipated DC Plan:  ASSISTED LIVING / REST HOME  In-house referral  Clinical Social Worker      DC Associate Professor  CM consult      [facility] Choice  HOME HEALTH   Choice offered to / List presented to:  C-4 Adult Children        HH arranged  HH-1 RN  HH-2 PT      HH agency  [facility]   Status of service:  Completed, signed off Medicare Important Message given?  YES (If response is "NO", the following Medicare IM given date fields will be blank) Date Medicare IM given:  05/31/2013 Date Additional Medicare IM given:    Discharge Disposition:  HOME W HOME HEALTH SERVICES  Per UR Regulation:  Reviewed for med. necessity/level of care/duration of stay  If discussed at Long Length of Stay Meetings, dates discussed:   05/26/2013    Comments:  05/31/13 1330 Anibal Henderson RN/CM Pt D/C to new ALF/family Care Home. HH set up- spoke with daughter [name] [name] concerning HH. She has a new Rx from just prior to hospitalization, and was told by facility that she cannot use this. Explained that MD would have sent pt to facility will all new Rx that he wants pt to use, and she verbalized understanding. Would like a W/C for the pt. Instructed to ask PT to get order from MD for this and PT can have this delivered to home 05/26/13 1400 Anibal Henderson RN Pt had open Appendectomy 05/25/13, and transferred to ICU 05/24/13 Rosemary Holms RN BSN CM 05/25/13 Amy Robson RN BSN CM Pt going to OR today

## 2013-05-24 NOTE — Clinical Social Work Note (Addendum)
CSW discussed [facility] bed offer at [facility] in [place]. She states she will talk with pt about it this evening and make decision by AM. CSW will follow up in the morning. If accepts, [facility] does not believe pt will require new TB test.  Derenda Fennel, LCSW [phone]

## 2013-05-24 NOTE — Progress Notes (Signed)
Subjective: She looks better. She says she wants to eat. She still has some abdominal pain but it is improved  Objective: Vital signs in last 24 hours: Temp:  [97.6 F (36.4 C)-98.4 F (36.9 C)] 97.7 F (36.5 C) (06/03 0448) Pulse Rate:  [60-81] 76 (06/03 0448) Resp:  [19-20] 19 (06/03 0448) BP: (102-137)/(60-84) 137/84 mmHg (06/03 0448) SpO2:  [93 %-98 %] 97 % (06/03 0448) Weight:  [70.806 kg (156 lb 1.6 oz)] 70.806 kg (156 lb 1.6 oz) (06/03 0416) Weight change: 2.506 kg (5 lb 8.4 oz) Last BM Date: 05/24/13  Intake/Output from previous day: 06/02 0701 - 06/03 0700 In: 3728.3 [P.O.:780; I.V.:2348.3; IV Piggyback:600] Out: 550 [Urine:550]  PHYSICAL EXAM General appearance: alert, cooperative and no distress Resp: clear to auscultation bilaterally Cardio: regular rate and rhythm, S1, S2 normal, no murmur, click, rub or gallop GI: Still mildly tender but less so Extremities: extremities normal, atraumatic, no cyanosis or edema  Lab Results:    Basic Metabolic Panel:  Recent Labs  29/56/21 1137  NA 137  K 4.4  CL 104  CO2 25  GLUCOSE 117*  BUN 13  CREATININE 1.12*  CALCIUM 8.8   Liver Function Tests: No results found for this basename: AST, ALT, ALKPHOS, BILITOT, PROT, ALBUMIN,  in the last 72 hours No results found for this basename: LIPASE, AMYLASE,  in the last 72 hours No results found for this basename: AMMONIA,  in the last 72 hours CBC:  Recent Labs  05/23/13 1137  WBC 6.6  HGB 11.8*  HCT 36.7  MCV 88.9  PLT 202   Cardiac Enzymes: No results found for this basename: CKTOTAL, CKMB, CKMBINDEX, TROPONINI,  in the last 72 hours BNP: No results found for this basename: PROBNP,  in the last 72 hours D-Dimer: No results found for this basename: DDIMER,  in the last 72 hours CBG: No results found for this basename: GLUCAP,  in the last 72 hours Hemoglobin A1C: No results found for this basename: HGBA1C,  in the last 72 hours Fasting Lipid Panel: No  results found for this basename: CHOL, HDL, LDLCALC, TRIG, CHOLHDL, LDLDIRECT,  in the last 72 hours Thyroid Function Tests: No results found for this basename: TSH, T4TOTAL, FREET4, T3FREE, THYROIDAB,  in the last 72 hours Anemia Panel: No results found for this basename: VITAMINB12, FOLATE, FERRITIN, TIBC, IRON, RETICCTPCT,  in the last 72 hours Coagulation: No results found for this basename: LABPROT, INR,  in the last 72 hours Urine Drug Screen: Drugs of Abuse  No results found for this basename: labopia, cocainscrnur, labbenz, amphetmu, thcu, labbarb    Alcohol Level: No results found for this basename: ETH,  in the last 72 hours Urinalysis: No results found for this basename: COLORURINE, APPERANCEUR, LABSPEC, PHURINE, GLUCOSEU, HGBUR, BILIRUBINUR, KETONESUR, PROTEINUR, UROBILINOGEN, NITRITE, LEUKOCYTESUR,  in the last 72 hours Misc. Labs:  ABGS No results found for this basename: PHART, PCO2, PO2ART, TCO2, HCO3,  in the last 72 hours CULTURES Recent Results (from the past 240 hour(s))  URINE CULTURE     Status: None   Collection Time    05/20/13  2:57 PM      Result Value Range Status   Specimen Description URINE, CLEAN CATCH   Final   Special Requests NONE   Final   Culture  Setup Time 05/21/2013 00:11   Final   Colony Count >=100,000 COLONIES/ML   Final   Culture ESCHERICHIA COLI   Final   Report Status 05/24/2013 FINAL  Final   Organism ID, Bacteria ESCHERICHIA COLI   Final  MRSA PCR SCREENING     Status: Abnormal   Collection Time    05/20/13  9:54 PM      Result Value Range Status   MRSA by PCR POSITIVE (*) NEGATIVE Final   Comment:            The GeneXpert MRSA Assay (FDA     approved for NASAL specimens     only), is one component of a     comprehensive MRSA colonization     surveillance program. It is not     intended to diagnose MRSA     infection nor to guide or     monitor treatment for     MRSA infections.     RESULT CALLED TO, READ BACK BY AND  VERIFIED WITH:     NEILSON,T @ 0200 ON 05/21/13 BY WOODIE,J   Studies/Results: Dg Abd 1 View  05/22/2013   *RADIOLOGY REPORT*  Clinical Data: Vomiting.  ABDOMEN - 1 VIEW  Comparison: CT 12/28/2012.  Plain film 05/20/2013  Findings: Extensive clothing artifact over the abdomen and pelvis. No gross free intraperitoneal air on the supine image.  No significant bowel distention.  Gas within normal caliber stomach and colon.  Distal stool and gas.  Extensive vascular calcifications.  Infrarenal abdominal aortic dilatation.  Far left abdomen excluded.  Severe bilateral hip osteoarthritis with a component of left-sided developmental dysplasia.  IMPRESSION: Although the exam is degraded by positioning and overlying artifact, no explanation for vomiting is seen.   Original Report Authenticated By: Jeronimo Greaves, M.D.    Medications:  Prior to Admission:  Prescriptions prior to admission  Medication Sig Dispense Refill  . ALPRAZolam (XANAX) 0.5 MG tablet Take 0.5 mg by mouth every 8 (eight) hours as needed.      . Choline Fenofibrate (FENOFIBRIC ACID) 135 MG CPDR Take 1 tablet by mouth daily.      Marland Kitchen diltiazem (TIAZAC) 240 MG 24 hr capsule Take 240 mg by mouth daily.      . divalproex (DEPAKOTE SPRINKLE) 125 MG capsule Take 125-250 mg by mouth 2 (two) times daily. Take two capsules in the morning and take one capsule at bedtime      . furosemide (LASIX) 40 MG tablet Take 40 mg by mouth daily.      Marland Kitchen HYDROcodone-acetaminophen (NORCO) 10-325 MG per tablet Take 1 tablet by mouth 3 (three) times daily as needed.      . mirtazapine (REMERON) 15 MG tablet Take 15 mg by mouth at bedtime.      . nebivolol (BYSTOLIC) 5 MG tablet Take 5 mg by mouth daily.      Marland Kitchen omeprazole (PRILOSEC) 20 MG capsule Take 20 mg by mouth daily.      . rosuvastatin (CRESTOR) 10 MG tablet Take 10 mg by mouth at bedtime.      Marland Kitchen tiotropium (SPIRIVA) 18 MCG inhalation capsule Place 18 mcg into inhaler and inhale daily.      . traMADol (ULTRAM)  50 MG tablet Take 50 mg by mouth every morning.       Marland Kitchen acetaminophen (Q-PAP) 500 MG tablet Take 500 mg by mouth every 4 (four) hours as needed. For pain      . albuterol (PROVENTIL) (2.5 MG/3ML) 0.083% nebulizer solution Take 2.5 mg by nebulization every 6 (six) hours as needed. For shortness of breath       Scheduled: . acidophilus  2 capsule Oral  Daily  . Chlorhexidine Gluconate Cloth  6 each Topical Q0600  . ciprofloxacin  400 mg Intravenous Q12H  . diltiazem  240 mg Oral Daily  . enoxaparin (LOVENOX) injection  40 mg Subcutaneous Q24H  . famotidine (PEPCID) IV  20 mg Intravenous Q24H  . mupirocin ointment  1 application Nasal BID  . piperacillin-tazobactam (ZOSYN)  IV  3.375 g Intravenous Q8H   Continuous: . 0.9 % NaCl with KCl 20 mEq / L 100 mL/hr at 05/23/13 2036   ZOX:[id number], albuterol, ALPRAZolam, ipratropium, ondansetron (ZOFRAN) IV, traZODone  Assesment: She has abdominal pain and the etiology of this is not totally clear but she's been treated as if she has appendicitis. She is not a good candidate for surgery. She does not have acute abdomen now. She wants to eat. Active Problems:   COPD (chronic obstructive pulmonary disease)   AAA (abdominal aortic aneurysm)   Abdominal pain   Nausea and vomiting   UTI (urinary tract infection)   Renal insufficiency    Plan: Advance her diet. If she can tolerate advanced diet then she may be able to be switched to oral antibiotics    LOS: 4 days   Jaidah Lomax L 05/24/2013, 8:29 AM

## 2013-05-24 NOTE — Progress Notes (Signed)
  Subjective: Pain better, but still present.  No nausea.  Objective: Vital signs in last 24 hours: Temp:  [97.6 F (36.4 C)-98.8 F (37.1 C)] 98 F (36.7 C) (06/03 2159) Pulse Rate:  [66-76] 70 (06/03 2159) Resp:  [16-19] 16 (06/03 2159) BP: (115-144)/(68-84) 144/71 mmHg (06/03 2159) SpO2:  [92 %-98 %] 98 % (06/03 2159) Weight:  [70.806 kg (156 lb 1.6 oz)] 70.806 kg (156 lb 1.6 oz) (06/03 0416) Last BM Date: 05/24/13  Intake/Output from previous day: 06/02 0701 - 06/03 0700 In: 3728.3 [P.O.:780; I.V.:2348.3; IV Piggyback:600] Out: 550 [Urine:550] Intake/Output this shift: Total I/O In: 1870 [P.O.:120; I.V.:1450; IV Piggyback:300] Out: -   General appearance: alert and no distress GI: +BS, soft, moderate RLQ pain.  No peritoneal signs.    Lab Results:   Recent Labs  05/23/13 1137  WBC 6.6  HGB 11.8*  HCT 36.7  PLT 202   BMET  Recent Labs  05/23/13 1137  NA 137  K 4.4  CL 104  CO2 25  GLUCOSE 117*  BUN 13  CREATININE 1.12*  CALCIUM 8.8   PT/INR No results found for this basename: LABPROT, INR,  in the last 72 hours ABG No results found for this basename: PHART, PCO2, PO2, HCO3,  in the last 72 hours  Studies/Results: No results found.  Anti-infectives: Anti-infectives   Start     Dose/Rate Route Frequency Ordered Stop   05/22/13 1200  piperacillin-tazobactam (ZOSYN) IVPB 3.375 g     3.375 g 12.5 mL/hr over 240 Minutes Intravenous Every 8 hours 05/22/13 0939     05/22/13 1000  ciprofloxacin (CIPRO) IVPB 400 mg     400 mg 200 mL/hr over 60 Minutes Intravenous Every 12 hours 05/22/13 0929     05/21/13 1700  cefTRIAXone (ROCEPHIN) 1 g in dextrose 5 % 50 mL IVPB  Status:  Discontinued     1 g 100 mL/hr over 30 Minutes Intravenous Every 24 hours 05/20/13 2248 05/22/13 0929   05/20/13 1800  cefTRIAXone (ROCEPHIN) 1 g in dextrose 5 % 50 mL IVPB     1 g 100 mL/hr over 30 Minutes Intravenous  Once 05/20/13 1759 05/20/13 1850       Assessment/Plan: s/p * No surgery found * Abdominal pain, improving.  Agree with advancement of diet.  doubt GB etiology. Available as needed.  LOS: 4 days    [name] [name] C 05/24/2013

## 2013-05-25 ENCOUNTER — Encounter (HOSPITAL_COMMUNITY)
Admission: EM | Disposition: A | Discharge status: Home Health | Payer: Self-pay | Source: Home / Self Care | Attending: Pulmonary Disease

## 2013-05-25 ENCOUNTER — Encounter (HOSPITAL_COMMUNITY): Payer: Self-pay | Admitting: Anesthesiology

## 2013-05-25 ENCOUNTER — Inpatient Hospital Stay (HOSPITAL_COMMUNITY): Payer: Medicare Other

## 2013-05-25 ENCOUNTER — Encounter (HOSPITAL_COMMUNITY): Payer: Self-pay | Admitting: *Deleted

## 2013-05-25 ENCOUNTER — Inpatient Hospital Stay (HOSPITAL_COMMUNITY): Payer: Medicare Other | Admitting: Anesthesiology

## 2013-05-25 DIAGNOSIS — N1831 Chronic kidney disease, stage 3a: Secondary | ICD-10-CM

## 2013-05-25 HISTORY — PX: APPENDECTOMY: SHX54

## 2013-05-25 HISTORY — DX: Chronic kidney disease, stage 3a: N18.31

## 2013-05-25 SURGERY — APPENDECTOMY
Anesthesia: General | Site: Abdomen | Wound class: Contaminated

## 2013-05-25 MED ORDER — ONDANSETRON HCL 4 MG/2ML IJ SOLN: 4.0000 mg | Freq: Once | INTRAMUSCULAR | Status: DC | PRN

## 2013-05-25 MED ORDER — IOHEXOL 300 MG/ML  SOLN
50.0000 mL | INTRAMUSCULAR | Status: AC
  Administered 2013-05-25 (×2): 50 mL via ORAL

## 2013-05-25 MED ORDER — FENTANYL CITRATE 0.05 MG/ML IJ SOLN: 25.0000 ug | INTRAMUSCULAR | Status: DC | PRN

## 2013-05-25 MED ORDER — METOPROLOL TARTRATE 1 MG/ML IV SOLN
INTRAVENOUS | Status: AC
  Filled 2013-05-25: qty 5

## 2013-05-25 MED ORDER — MIDAZOLAM HCL 5 MG/5ML IJ SOLN
INTRAMUSCULAR | Status: DC | PRN
  Administered 2013-05-25: 2 mg via INTRAVENOUS

## 2013-05-25 MED ORDER — PHENYLEPHRINE HCL 10 MG/ML IJ SOLN
INTRAMUSCULAR | Status: DC | PRN
  Administered 2013-05-25: 100 ug via INTRAVENOUS

## 2013-05-25 MED ORDER — LIDOCAINE HCL (PF) 1 % IJ SOLN
INTRAMUSCULAR | Status: AC
  Filled 2013-05-25: qty 5

## 2013-05-25 MED ORDER — SUCCINYLCHOLINE CHLORIDE 20 MG/ML IJ SOLN
INTRAMUSCULAR | Status: AC
  Filled 2013-05-25: qty 1

## 2013-05-25 MED ORDER — SODIUM CHLORIDE 0.9 % IJ SOLN
3.0000 mL | Freq: Two times a day (BID) | INTRAMUSCULAR | Status: DC
  Administered 2013-05-25 – 2013-05-29 (×8): 3 mL via INTRAVENOUS

## 2013-05-25 MED ORDER — MIDAZOLAM HCL 2 MG/2ML IJ SOLN
INTRAMUSCULAR | Status: AC
  Filled 2013-05-25: qty 2

## 2013-05-25 MED ORDER — BUPIVACAINE HCL (PF) 0.5 % IJ SOLN
INTRAMUSCULAR | Status: AC
  Filled 2013-05-25: qty 30

## 2013-05-25 MED ORDER — 0.9 % SODIUM CHLORIDE (POUR BTL) OPTIME
TOPICAL | Status: DC | PRN
  Administered 2013-05-25: 1000 mL

## 2013-05-25 MED ORDER — FENTANYL CITRATE 0.05 MG/ML IJ SOLN
INTRAMUSCULAR | Status: AC
  Filled 2013-05-25: qty 5

## 2013-05-25 MED ORDER — SODIUM CHLORIDE 0.9 % IV SOLN
Freq: Once | INTRAVENOUS | Status: AC
  Administered 2013-05-25: 1000 mL via INTRAVENOUS

## 2013-05-25 MED ORDER — FENTANYL CITRATE 0.05 MG/ML IJ SOLN
INTRAMUSCULAR | Status: DC | PRN
  Administered 2013-05-25 (×5): 50 ug via INTRAVENOUS

## 2013-05-25 MED ORDER — DEXTROSE 5 % IV SOLN
1.0000 g | INTRAVENOUS | Status: DC
  Administered 2013-05-25 – 2013-05-30 (×6): 1 g via INTRAVENOUS
  Filled 2013-05-25 (×7): qty 10

## 2013-05-25 MED ORDER — SODIUM CHLORIDE 0.9 % IJ SOLN: 3.0000 mL | Freq: Two times a day (BID) | INTRAMUSCULAR | Status: DC

## 2013-05-25 MED ORDER — SODIUM CHLORIDE 0.9 % IV SOLN
INTRAVENOUS | Status: DC | PRN
  Administered 2013-05-25: 14:00:00 via INTRAVENOUS

## 2013-05-25 MED ORDER — ETOMIDATE 2 MG/ML IV SOLN
INTRAVENOUS | Status: AC
  Filled 2013-05-25: qty 10

## 2013-05-25 MED ORDER — ROCURONIUM BROMIDE 50 MG/5ML IV SOLN
INTRAVENOUS | Status: AC
  Filled 2013-05-25: qty 1

## 2013-05-25 MED ORDER — ROCURONIUM BROMIDE 100 MG/10ML IV SOLN
INTRAVENOUS | Status: DC | PRN
  Administered 2013-05-25: 20 mg via INTRAVENOUS

## 2013-05-25 MED ORDER — HYDROCODONE-ACETAMINOPHEN 5-325 MG PO TABS
1.0000 | ORAL_TABLET | ORAL | Status: DC | PRN
  Administered 2013-05-25 (×3): 1 via ORAL
  Administered 2013-05-26 – 2013-05-27 (×2): 2 via ORAL
  Administered 2013-05-28 – 2013-05-31 (×8): 1 via ORAL
  Filled 2013-05-25 (×4): qty 1
  Filled 2013-05-25: qty 2
  Filled 2013-05-25: qty 1
  Filled 2013-05-25: qty 2
  Filled 2013-05-25 (×5): qty 1
  Filled 2013-05-25: qty 2
  Filled 2013-05-25: qty 1

## 2013-05-25 MED ORDER — GLYCOPYRROLATE 0.2 MG/ML IJ SOLN
INTRAMUSCULAR | Status: AC
  Filled 2013-05-25: qty 2

## 2013-05-25 MED ORDER — BIOTENE DRY MOUTH MT LIQD
15.0000 mL | Freq: Two times a day (BID) | OROMUCOSAL | Status: DC
  Administered 2013-05-25 – 2013-05-31 (×11): 15 mL via OROMUCOSAL

## 2013-05-25 MED ORDER — ONDANSETRON HCL 4 MG/2ML IJ SOLN
4.0000 mg | Freq: Once | INTRAMUSCULAR | Status: AC
  Administered 2013-05-25: 4 mg via INTRAVENOUS

## 2013-05-25 MED ORDER — NEOSTIGMINE METHYLSULFATE 1 MG/ML IJ SOLN
INTRAMUSCULAR | Status: DC | PRN
  Administered 2013-05-25: 1 mg via INTRAVENOUS
  Administered 2013-05-25: 2 mg via INTRAVENOUS

## 2013-05-25 MED ORDER — BIOTENE DRY MOUTH MT LIQD
15.0000 mL | Freq: Two times a day (BID) | OROMUCOSAL | Status: DC
Start: 2013-05-25 — End: 2013-05-25

## 2013-05-25 MED ORDER — PHENYLEPHRINE HCL 10 MG/ML IJ SOLN
INTRAMUSCULAR | Status: AC
  Filled 2013-05-25: qty 1

## 2013-05-25 MED ORDER — MIDAZOLAM HCL 2 MG/2ML IJ SOLN
1.0000 mg | INTRAMUSCULAR | Status: DC | PRN
  Administered 2013-05-25: 2 mg via INTRAVENOUS

## 2013-05-25 MED ORDER — IOHEXOL 300 MG/ML  SOLN
100.0000 mL | Freq: Once | INTRAMUSCULAR | Status: AC | PRN
  Administered 2013-05-25: 100 mL via INTRAVENOUS

## 2013-05-25 MED ORDER — ONDANSETRON HCL 4 MG/2ML IJ SOLN
INTRAMUSCULAR | Status: AC
  Filled 2013-05-25: qty 2

## 2013-05-25 MED ORDER — ETOMIDATE 2 MG/ML IV SOLN
INTRAVENOUS | Status: DC | PRN
  Administered 2013-05-25: 12 mg via INTRAVENOUS

## 2013-05-25 MED ORDER — SUCCINYLCHOLINE CHLORIDE 20 MG/ML IJ SOLN
INTRAMUSCULAR | Status: DC | PRN
  Administered 2013-05-25: 150 mg via INTRAVENOUS

## 2013-05-25 MED ORDER — LACTATED RINGERS IV SOLN: INTRAVENOUS | Status: DC

## 2013-05-25 SURGICAL SUPPLY — 28 items
BAG HAMPER (MISCELLANEOUS) ×2 IMPLANT
CLOTH BEACON ORANGE TIMEOUT ST (SAFETY) ×2 IMPLANT
COVER LIGHT HANDLE STERIS (MISCELLANEOUS) ×4 IMPLANT
DURAPREP 26ML APPLICATOR (WOUND CARE) ×2 IMPLANT
ELECT REM PT RETURN 9FT ADLT (ELECTROSURGICAL) ×2
ELECTRODE REM PT RTRN 9FT ADLT (ELECTROSURGICAL) ×1 IMPLANT
FORMALIN 10 PREFIL 120ML (MISCELLANEOUS) ×2 IMPLANT
GLOVE BIOGEL PI IND STRL 7.0 (GLOVE) ×3 IMPLANT
GLOVE BIOGEL PI IND STRL 7.5 (GLOVE) ×1 IMPLANT
GLOVE BIOGEL PI INDICATOR 7.0 (GLOVE) ×3
GLOVE BIOGEL PI INDICATOR 7.5 (GLOVE) ×1
GLOVE ECLIPSE 7.0 STRL STRAW (GLOVE) ×4 IMPLANT
GLOVE EXAM NITRILE MD LF STRL (GLOVE) ×2 IMPLANT
GLOVE SS BIOGEL STRL SZ 6.5 (GLOVE) ×1 IMPLANT
GLOVE SUPERSENSE BIOGEL SZ 6.5 (GLOVE) ×1
GOWN STRL REIN XL XLG (GOWN DISPOSABLE) ×4 IMPLANT
KIT ROOM TURNOVER APOR (KITS) ×2 IMPLANT
MANIFOLD NEPTUNE II (INSTRUMENTS) ×2 IMPLANT
PACK ABDOMINAL MAJOR (CUSTOM PROCEDURE TRAY) ×2 IMPLANT
PAD ARMBOARD 7.5X6 YLW CONV (MISCELLANEOUS) ×2 IMPLANT
SET BASIN LINEN APH (SET/KITS/TRAYS/PACK) ×2 IMPLANT
SPONGE GAUZE 4X4 12PLY (GAUZE/BANDAGES/DRESSINGS) ×2 IMPLANT
STAPLER VISISTAT (STAPLE) ×2 IMPLANT
SUT PDS AB 0 CTX 60 (SUTURE) ×4 IMPLANT
SUT SILK 3 0 SH CR/8 (SUTURE) ×2 IMPLANT
SYR BULB IRRIGATION 50ML (SYRINGE) ×2 IMPLANT
TAPE CLOTH SURG 4X10 WHT LF (GAUZE/BANDAGES/DRESSINGS) ×2 IMPLANT
TRAY FOLEY CATH 14FR (SET/KITS/TRAYS/PACK) ×2 IMPLANT

## 2013-05-25 NOTE — Progress Notes (Signed)
Sacral dressing placed on patients sacrum per protocol. Patient's skin WNL at the time the dressing was placed.

## 2013-05-25 NOTE — Progress Notes (Signed)
ANTIBIOTIC CONSULT NOTE   Pharmacy Consult for Zoysn Indication: chloecystitis vs UTI  Allergies  Allergen Reactions  . Asa (Aspirin) Other (See Comments)    "my ears bleed"  . Lidocaine Other (See Comments)    unknown  . Nsaids Other (See Comments)    unknown  . Sulfonamide Derivatives Other (See Comments)    unknown   Patient Measurements: Height: 5\' 6"  (167.6 cm) Weight: 152 lb 11.2 oz (69.264 kg) IBW/kg (Calculated) : 59.3  Vital Signs: Temp: 98 F (36.7 C) (06/04 0640) BP: 117/71 mmHg (06/04 0640) Pulse Rate: 64 (06/04 0640) Intake/Output from previous day: 06/03 0701 - 06/04 0700 In: 2748.3 [P.O.:300; I.V.:1898.3; IV Piggyback:550] Out: 400 [Urine:400] Intake/Output from this shift:    Labs:  Recent Labs  05/23/13 1137  WBC 6.6  HGB 11.8*  PLT 202  CREATININE 1.12*   Estimated Creatinine Clearance: 40.6 ml/min (by C-G formula based on Cr of 1.12). No results found for this basename: VANCOTROUGH, Leodis Binet, VANCORANDOM, GENTTROUGH, GENTPEAK, GENTRANDOM, TOBRATROUGH, TOBRAPEAK, TOBRARND, AMIKACINPEAK, AMIKACINTROU, AMIKACIN,  in the last 72 hours   Microbiology: Recent Results (from the past 720 hour(s))  URINE CULTURE     Status: None   Collection Time    05/20/13  2:57 PM      Result Value Range Status   Specimen Description URINE, CLEAN CATCH   Final   Special Requests NONE   Final   Culture  Setup Time 05/21/2013 00:11   Final   Colony Count >=100,000 COLONIES/ML   Final   Culture ESCHERICHIA COLI   Final   Report Status 05/24/2013 FINAL   Final   Organism ID, Bacteria ESCHERICHIA COLI   Final  MRSA PCR SCREENING     Status: Abnormal   Collection Time    05/20/13  9:54 PM      Result Value Range Status   MRSA by PCR POSITIVE (*) NEGATIVE Final   Comment:            The GeneXpert MRSA Assay (FDA     approved for NASAL specimens     only), is one component of a     comprehensive MRSA colonization     surveillance program. It is not   intended to diagnose MRSA     infection nor to guide or     monitor treatment for     MRSA infections.     RESULT CALLED TO, READ BACK BY AND VERIFIED WITH:     NEILSON,T @ 0200 ON 05/21/13 BY WOODIE,J   URINE, CLEAN CATCH    Special Requests NONE    Culture Setup Time 05/21/2013 00:11    Colony Count >=100,000 COLONIES/ML    Culture ESCHERICHIA COLI    Report Status 05/24/2013 FINAL    Organism ID, Bacteria ESCHERICHIA COLI    Resulting Agency [facility]   Culture & Susceptibility    Antibiotic  Organism Organism Organism     ESCHERICHIA COLI     AMPICILLIN  >=32 RESISTANT R Final       CEFAZOLIN  <=4 SENSITIVE S Final       CEFTRIAXONE  <=1 SENSITIVE S Final       CIPROFLOXACIN  >=4 RESISTANT R Final       GENTAMICIN  <=1 SENSITIVE S Final       LEVOFLOXACIN  >=8 RESISTANT R Final       NITROFURANTOIN  <=16 SENSITIVE S Final       PIP/TAZO  <=4 SENSITIVE S Final  TOBRAMYCIN  <=1 SENSITIVE S Final       TRIMETH/SULFA  <=20 SENSITIVE S Final       Medical History: Past Medical History  Diagnosis Date  . Hypertension   . Hyperlipidemia   . COPD (chronic obstructive pulmonary disease)     multiple hospitalizations for exacerbations with acute bronchitis  . Degenerative joint disease     Bilateral hips  . Depression with anxiety   . GERD (gastroesophageal reflux disease)     Hiatal hernia  . Atrial arrhythmia     Atrial fibrillation and atrial flutter diagnosed in the past;?  PSVT  . AAA (abdominal aortic aneurysm)     infrarenal; 07/2012: Diameter of 5.4 cm  . Arteriosclerotic cardiovascular disease (ASCVD)     Cardiac cath in 03/2000->  80% LAD, 70-80% RCA;  stent x1 in the LAD and x2 in the RCA with excellent angiographic outcome; repeat catheterization in late 2001 and 2003 revealed no restenosis; EF-50%  . Tobacco abuse, in remission     Quit in 2006; total consumption of 50-75 pack years  . Pituitary microadenoma     transsphenoidal resection in 09/2007  .  Anemia   . Coronary artery disease   . PVD (peripheral vascular disease)   . Gall stones   . Hypokalemia   . Ileus   . SBO (small bowel obstruction)   . Hypernatremia   . Nephrolithiasis   . Renal failure   . DJD (degenerative joint disease)   . Encephalopathy    Medications:  Scheduled:  . acidophilus  2 capsule Oral Daily  . ciprofloxacin  400 mg Intravenous Q12H  . diltiazem  240 mg Oral Daily  . enoxaparin (LOVENOX) injection  40 mg Subcutaneous Q24H  . famotidine (PEPCID) IV  20 mg Intravenous Q24H  . piperacillin-tazobactam (ZOSYN)  IV  3.375 g Intravenous Q8H   Assessment: 78 yo F admitted with abdominal pain and nausea/vomiting.  She was initially started on Rocephin for UTI but antibiotic coverage is now being broadened for cholecystitis.  Urine culture noted SENS to Zosyn.   Renal function is at patient's baseline. Estimated Creatinine Clearance: 40.6 ml/min (by C-G formula based on Cr of 1.12).  Dr Juanetta Gosling is consulting surgery for recommendations re: ongoing IV ABX.  D/W Dr Juanetta Gosling on 6/3, continue Cipro for now.  Rocephin 5/31>>6/1 Zoysn 6/1>> Cipro 6/1>>  Goal of Therapy:  Eradicate infection.  Plan:  Zosyn 3.375gm IV Q8h to be infused over 4hrs Monitor renal function and cx data   Valrie Hart A 05/25/2013,10:52 AM

## 2013-05-25 NOTE — Transfer of Care (Signed)
Immediate Anesthesia Transfer of Care Note  Patient: [name] [name]  Procedure(s) Performed: Procedure(s): APPENDECTOMY (N/A)  Patient Location: PACU  Anesthesia Type:General  Level of Consciousness: awake and patient cooperative  Airway & Oxygen Therapy: Patient Spontanous Breathing and Patient connected to face mask oxygen  Post-op Assessment: Report given to PACU RN, Post -op Vital signs reviewed and stable and Patient moving all extremities  Post vital signs: Reviewed and stable  Complications: No apparent anesthesia complications

## 2013-05-25 NOTE — Anesthesia Procedure Notes (Signed)
Procedure Name: Intubation Date/Time: 05/25/2013 2:44 PM Performed by: Despina Hidden Pre-anesthesia Checklist: Emergency Drugs available, Patient identified, Suction available and Patient being monitored Patient Re-evaluated:Patient Re-evaluated prior to inductionOxygen Delivery Method: Circle system utilized Preoxygenation: Pre-oxygenation with 100% oxygen Intubation Type: IV induction, Rapid sequence and Cricoid Pressure applied Laryngoscope Size: Mac and 3 Grade View: Grade I Tube type: Oral Tube size: 7.0 mm Number of attempts: 1 Airway Equipment and Method: Stylet Placement Confirmation: ETT inserted through vocal cords under direct vision,  positive ETCO2 and breath sounds checked- equal and bilateral Secured at: 20 cm Tube secured with: Tape Dental Injury: Teeth and Oropharynx as per pre-operative assessment

## 2013-05-25 NOTE — Anesthesia Preprocedure Evaluation (Signed)
Anesthesia Evaluation  Patient identified by MRN, date of birth, ID band Patient confused    Reviewed: Allergy & Precautions, H&P , NPO status , Patient's Chart, lab work & pertinent test results, Unable to perform ROS - Chart review only  Airway Mallampati: II TM Distance: >3 FB Neck ROM: Full    Dental  (+) Edentulous Upper and Edentulous Lower   Pulmonary COPDformer smoker,  breath sounds clear to auscultation        Cardiovascular hypertension, Pt. on medications + CAD, + Cardiac Stents and + Peripheral Vascular Disease (AAA) + dysrhythmias Atrial Fibrillation and Supra Ventricular Tachycardia Rhythm:Regular Rate:Normal     Neuro/Psych PSYCHIATRIC DISORDERS Anxiety Depression    GI/Hepatic GERD-  Medicated and Controlled,  Endo/Other    Renal/GU Renal disease     Musculoskeletal   Abdominal   Peds  Hematology   Anesthesia Other Findings   Reproductive/Obstetrics                           Anesthesia Physical Anesthesia Plan  ASA: III  Anesthesia Plan: General   Post-op Pain Management:    Induction: Intravenous, Rapid sequence and Cricoid pressure planned  Airway Management Planned: Oral ETT  Additional Equipment:   Intra-op Plan:   Post-operative Plan: Extubation in OR  Informed Consent: I have reviewed the patients History and Physical, chart, labs and discussed the procedure including the risks, benefits and alternatives for the proposed anesthesia with the patient or authorized representative who has indicated his/her understanding and acceptance.     Plan Discussed with:   Anesthesia Plan Comments: (amidate induction )        Anesthesia Quick Evaluation

## 2013-05-25 NOTE — Op Note (Signed)
Patient:  [name] [name]  DOB:  14-Sep-1935  MRN:  [id number]   Preop Diagnosis:  Acute appendicitis  Postop Diagnosis:  The same  Procedure: Open appendectomy  Surgeon:  Dr. Tilford Pillar  Anes:  General endotracheal  Indications:  Patient is a 78 year old female presented to [facility] right-sided abdominal pain. She continued to have persistent abdominal pain and episodes of nausea. She never demonstrated any leukocytosis, fever or other clinical findings suggestive for an acute infectious process. She did have a CT of the abdomen and pelvis due to her continued pain which demonstrated inflammatory changes around the appendix consistent for acute appendicitis. Risks benefits and alternatives were discussed and she was taken to the operating room for an open appendectomy for her appendicitis. Consent was obtained emergently and signed by both myself and Dr. Juanetta Gosling as family was not readily available.  Procedure note:  Patient was taken to the operating was placed in supine position the or table time the general anesthetic is administered. Once patient was asleep she was endotracheally needed by the nurse anesthetist. A Foley catheter is placed in standard sterile fashion by the operative staff. Her abdomen is prepped with DuraPrep solution and draped in standard fashion. Time out was performed. A midline incision was created at the umbilical 10 blade scalpel with additional dissection down to subcutaneous tissue carried out using electrocautery down to the anterior abdominal fascia which is divided using electrocautery. At this point the facets grasped and lifted with Coker clamps and the peritoneum was identified. The peritoneum was grasped with a hemostat and elevated. It is pinched to confirm there is no involved loops of small intestine and opened into sharply. At this point the peritoneum was opened superiorly and inferiorly. At this time I followed the small bowel to the terminal  ileum and the cecum. The cecum was mobilized and brought medially. Continue or fall down to the base the appendix which was identified. It was tethered with a single adhesion/band adhesion at the tip of the appendix. The appendix was clearly dilated and inflamed. At this point. A window between the appendix and mesoappendix at the base the appendix. The appendix was crushed with a hemostat which is placed proximally a second one distally and the appendix is divided between 2 hemostats. The mesoappendix was then clamped with a hemostat and the knees appendix was divided distal to the clamp. At this point the appendix is free is placed in the back table and sent as a perm specimen to pathology. At this time I placed a 3-0 silk suture to ligate the eighth of the appendix. The mucosal surface was scored with electrocautery. I then imbricated the appendiceal stump with interrupted 30 imbricating silk sutures. These tails were left long. The mesoappendix was also ligated using a 3-0 silk suture. A small piece of adjacent adipose tissue was then placed over the site of closure in the long silk sutures was used to secure this over the imbricated appendiceal stump for redundancy. The silk sutures were trimmed short. At this time I did palpate the remainder of the abdomen. The abdominal aortic aneurysm was easily palpated. The liver was noted to be normal the stomach was normal. The small intestine was run and was noted to be normal. No other abnormalities were noted within the abdomen. Attention at this time was turned to closure.  Using a PDS suture the fascial edges were closed using a oh looped PDS suture x2. The sutures started both superiorly and inferiorly  and brought to the midpoint of the incision at which point they were secured to each other. The suture was buried. At this time the skin edges were reapproximated using skin staples. The skin was washed dried moist dry towel. Sterile dressings are placed. The  drapes removed the dressings were secured. The patient was allowed to come out of general anesthetic. She stretcher the PACU in stable condition. At the conclusion of procedure all instrument, sponge, needle counts are correct. Patient tolerated procedure she well. She did have an episode of SVT during the procedure which was quickly controlled by anesthesia.  Complications:  None apparent  EBL:  Less than 50 ML.  Specimen:  Appendix

## 2013-05-25 NOTE — Progress Notes (Signed)
Day of Surgery  Subjective: Still with right lower quadrant abdominal pain. Emesis with by mouth intake this morning.  Objective: Vital signs in last 24 hours: Temp:  [97.6 F (36.4 [name])-98.1 F (36.7 [name])] 98 F (36.7 [name]) (06/04 0640) Pulse Rate:  [64-74] 64 (06/04 0640) Resp:  [16-20] 20 (06/04 0640) BP: (117-154)/(70-82) 117/71 mmHg (06/04 0640) SpO2:  [94 %-98 %] 95 % (06/04 0640) Weight:  [69.264 kg (152 lb 11.2 oz)] 69.264 kg (152 lb 11.2 oz) (06/04 0500) Last BM Date: 05/24/13  Intake/Output from previous day: 06/03 0701 - 06/04 0700 In: 2748.3 [P.O.:300; I.V.:1898.3; IV Piggyback:550] Out: 400 [Urine:400] Intake/Output this shift:    General appearance: alert and no distress GI: Intermittent bowel sounds, soft, moderate right lower quadrant abdominal tenderness. No diffuse peritoneal signs  Lab Results:   Recent Labs  05/23/13 1137  WBC 6.6  HGB 11.8*  HCT 36.7  PLT 202   BMET  Recent Labs  05/23/13 1137  NA 137  K 4.4  CL 104  CO2 25  GLUCOSE 117*  BUN 13  CREATININE 1.12*  CALCIUM 8.8   PT/INR No results found for this basename: LABPROT, INR,  in the last 72 hours ABG No results found for this basename: PHART, PCO2, PO2, HCO3,  in the last 72 hours  Studies/Results: Ct Abdomen Pelvis W Contrast  05/25/2013   *RADIOLOGY REPORT*  Clinical Data: Abdominal pain with vomiting since 05/20/2013. History of abdominal aortic aneurysm, coronary artery disease, peripheral vascular disease and small bowel obstruction.  CT ABDOMEN AND PELVIS WITH CONTRAST  Technique:  Multidetector CT imaging of the abdomen and pelvis was performed following the standard protocol during bolus administration of intravenous contrast.  Contrast: OMNIPAQUE IOHEXOL 300 MG/ML  SOLN  Comparison: 12/28/2012.  Findings: Findings of appendicitis with inflamed appendix with small amount of fluid along the appendiceal tip and infiltration of surrounding fat planes.  Complex atherosclerotic  type changes of the abdominal aorta including prominent calcified and noncalcified plaque, several aneurysms with largest aneurysm L3-4 level having increased in size from the recent 01/07/014 examination now with maximal transverse dimension of 5.6 x 5.4 cm versus prior maximal transverse dimension of 5.1 x 5.2 cm.  No CT evidence of periaortic bleeding. Atherosclerotic type changes contribute to the branch vessel narrowing including iliac artery narrowing.  Coronary artery calcifications.  Heart size within normal limits.  Fatty liver without focal lesion.  No calcified gallstone.  Right renal lesions suggestive of cysts although some too small to characterize.  Scarring left kidney with nonobstructing tiny renal calculi.  No worrisome splenic, pancreatic or adrenal lesion.  Severe bilateral hip destruction which may be related to degenerative changes, avascular necrosis with superimposed degenerative changes or underlying arthropathy.  Noncontrast filled views of the urinary bladder without gross abnormality.  Mild retroverted uterus.  No adnexal mass noted.  IMPRESSION: Findings of appendicitis with inflamed appendix with small amount of fluid along the appendiceal tip and infiltration of surrounding fat planes.  Abdominal aortic aneurysm L3-4 level has increased in size from the recent 01/07/014 examination now with maximal transverse dimension of 5.6 x 5.4 cm versus prior maximal transverse dimension of 5.1 x 5.2 cm.  Please see above for additional findings.  Critical Value/emergent results were called by telephone at the time of interpretation on 05/25/2013 at 12/05 p.m. to Dr. Juanetta Gosling, who verbally acknowledged these results.   Original Report Authenticated By: Lacy Duverney, M.D.    Anti-infectives: Anti-infectives   Start  Dose/Rate Route Frequency Ordered Stop   05/22/13 1200  [MAR Hold]  piperacillin-tazobactam (ZOSYN) IVPB 3.375 g     (On MAR Hold since 05/25/13 1355)   3.375 g 12.5 mL/hr  over 240 Minutes Intravenous Every 8 hours 05/22/13 0939     05/22/13 1000  [MAR Hold]  ciprofloxacin (CIPRO) IVPB 400 mg     (On MAR Hold since 05/25/13 1355)   400 mg 200 mL/hr over 60 Minutes Intravenous Every 12 hours 05/22/13 0929     05/21/13 1700  cefTRIAXone (ROCEPHIN) 1 g in dextrose 5 % 50 mL IVPB  Status:  Discontinued     1 g 100 mL/hr over 30 Minutes Intravenous Every 24 hours 05/20/13 2248 05/22/13 0929   05/20/13 1800  cefTRIAXone (ROCEPHIN) 1 g in dextrose 5 % 50 mL IVPB     1 g 100 mL/hr over 30 Minutes Intravenous  Once 05/20/13 1759 05/20/13 1850      Assessment/Plan: s/p Procedure(s): APPENDECTOMY (N/A) Acute appendicitis. Discussed with patient findings on her morning CT evaluation the abdomen and pelvis. Despite her lack of fever, tachycardia, leukocytosis or shift patient has evidence of inflammatory changes around the appendix on her CT scan. Options were discussed with patient. To see her presence of her large and normal aortic aneurysm at this time we'll plan to proceed with an open appendectomy. Risks benefits alternatives again discussed with patient. As the appendiceal etiologies warrants emergent/urgent intervention consent will be obtained. Dr. Juanetta Gosling aware.  LOS: 5 days    [name] [name] 05/25/2013

## 2013-05-25 NOTE — Progress Notes (Signed)
Subjective: She seems a little better. She still has some abdominal pain it may be centered over her bladder now. She has no other new complaints. Her diet was advanced yesterday  Objective: Vital signs in last 24 hours: Temp:  [97.6 F (36.4 C)-98.8 F (37.1 C)] 98 F (36.7 C) (06/04 0640) Pulse Rate:  [64-74] 64 (06/04 0640) Resp:  [16-20] 20 (06/04 0640) BP: (115-154)/(68-82) 117/71 mmHg (06/04 0640) SpO2:  [92 %-98 %] 95 % (06/04 0640) Weight:  [69.264 kg (152 lb 11.2 oz)] 69.264 kg (152 lb 11.2 oz) (06/04 0500) Weight change: -1.542 kg (-3 lb 6.4 oz) Last BM Date: 05/24/13  Intake/Output from previous day: 06/03 0701 - 06/04 0700 In: 2748.3 [P.O.:300; I.V.:1898.3; IV Piggyback:550] Out: 400 [Urine:400]  PHYSICAL EXAM General appearance: alert, cooperative and mild distress Resp: clear to auscultation bilaterally Cardio: regular rate and rhythm, S1, S2 normal, no murmur, click, rub or gallop GI: She has tenderness which is now in the suprapubic area Extremities: extremities normal, atraumatic, no cyanosis or edema  Lab Results:    Basic Metabolic Panel:  Recent Labs  16/10/96 1137  NA 137  K 4.4  CL 104  CO2 25  GLUCOSE 117*  BUN 13  CREATININE 1.12*  CALCIUM 8.8   Liver Function Tests: No results found for this basename: AST, ALT, ALKPHOS, BILITOT, PROT, ALBUMIN,  in the last 72 hours No results found for this basename: LIPASE, AMYLASE,  in the last 72 hours No results found for this basename: AMMONIA,  in the last 72 hours CBC:  Recent Labs  05/23/13 1137  WBC 6.6  HGB 11.8*  HCT 36.7  MCV 88.9  PLT 202   Cardiac Enzymes: No results found for this basename: CKTOTAL, CKMB, CKMBINDEX, TROPONINI,  in the last 72 hours BNP: No results found for this basename: PROBNP,  in the last 72 hours D-Dimer: No results found for this basename: DDIMER,  in the last 72 hours CBG: No results found for this basename: GLUCAP,  in the last 72 hours Hemoglobin  A1C: No results found for this basename: HGBA1C,  in the last 72 hours Fasting Lipid Panel: No results found for this basename: CHOL, HDL, LDLCALC, TRIG, CHOLHDL, LDLDIRECT,  in the last 72 hours Thyroid Function Tests: No results found for this basename: TSH, T4TOTAL, FREET4, T3FREE, THYROIDAB,  in the last 72 hours Anemia Panel: No results found for this basename: VITAMINB12, FOLATE, FERRITIN, TIBC, IRON, RETICCTPCT,  in the last 72 hours Coagulation: No results found for this basename: LABPROT, INR,  in the last 72 hours Urine Drug Screen: Drugs of Abuse  No results found for this basename: labopia, cocainscrnur, labbenz, amphetmu, thcu, labbarb    Alcohol Level: No results found for this basename: ETH,  in the last 72 hours Urinalysis: No results found for this basename: COLORURINE, APPERANCEUR, LABSPEC, PHURINE, GLUCOSEU, HGBUR, BILIRUBINUR, KETONESUR, PROTEINUR, UROBILINOGEN, NITRITE, LEUKOCYTESUR,  in the last 72 hours Misc. Labs:  ABGS No results found for this basename: PHART, PCO2, PO2ART, TCO2, HCO3,  in the last 72 hours CULTURES Recent Results (from the past 240 hour(s))  URINE CULTURE     Status: None   Collection Time    05/20/13  2:57 PM      Result Value Range Status   Specimen Description URINE, CLEAN CATCH   Final   Special Requests NONE   Final   Culture  Setup Time 05/21/2013 00:11   Final   Colony Count >=100,000 COLONIES/ML   Final  Culture ESCHERICHIA COLI   Final   Report Status 05/24/2013 FINAL   Final   Organism ID, Bacteria ESCHERICHIA COLI   Final  MRSA PCR SCREENING     Status: Abnormal   Collection Time    05/20/13  9:54 PM      Result Value Range Status   MRSA by PCR POSITIVE (*) NEGATIVE Final   Comment:            The GeneXpert MRSA Assay (FDA     approved for NASAL specimens     only), is one component of a     comprehensive MRSA colonization     surveillance program. It is not     intended to diagnose MRSA     infection nor to guide  or     monitor treatment for     MRSA infections.     RESULT CALLED TO, READ BACK BY AND VERIFIED WITH:     NEILSON,T @ 0200 ON 05/21/13 BY WOODIE,J   Studies/Results: No results found.  Medications:  Prior to Admission:  Prescriptions prior to admission  Medication Sig Dispense Refill  . ALPRAZolam (XANAX) 0.5 MG tablet Take 0.5 mg by mouth every 8 (eight) hours as needed.      . Choline Fenofibrate (FENOFIBRIC ACID) 135 MG CPDR Take 1 tablet by mouth daily.      Marland Kitchen diltiazem (TIAZAC) 240 MG 24 hr capsule Take 240 mg by mouth daily.      . divalproex (DEPAKOTE SPRINKLE) 125 MG capsule Take 125-250 mg by mouth 2 (two) times daily. Take two capsules in the morning and take one capsule at bedtime      . furosemide (LASIX) 40 MG tablet Take 40 mg by mouth daily.      Marland Kitchen HYDROcodone-acetaminophen (NORCO) 10-325 MG per tablet Take 1 tablet by mouth 3 (three) times daily as needed.      . mirtazapine (REMERON) 15 MG tablet Take 15 mg by mouth at bedtime.      . nebivolol (BYSTOLIC) 5 MG tablet Take 5 mg by mouth daily.      Marland Kitchen omeprazole (PRILOSEC) 20 MG capsule Take 20 mg by mouth daily.      . rosuvastatin (CRESTOR) 10 MG tablet Take 10 mg by mouth at bedtime.      Marland Kitchen tiotropium (SPIRIVA) 18 MCG inhalation capsule Place 18 mcg into inhaler and inhale daily.      . traMADol (ULTRAM) 50 MG tablet Take 50 mg by mouth every morning.       Marland Kitchen acetaminophen (Q-PAP) 500 MG tablet Take 500 mg by mouth every 4 (four) hours as needed. For pain      . albuterol (PROVENTIL) (2.5 MG/3ML) 0.083% nebulizer solution Take 2.5 mg by nebulization every 6 (six) hours as needed. For shortness of breath       Scheduled: . acidophilus  2 capsule Oral Daily  . ciprofloxacin  400 mg Intravenous Q12H  . diltiazem  240 mg Oral Daily  . enoxaparin (LOVENOX) injection  40 mg Subcutaneous Q24H  . famotidine (PEPCID) IV  20 mg Intravenous Q24H  . mupirocin ointment  1 application Nasal BID  . piperacillin-tazobactam  (ZOSYN)  IV  3.375 g Intravenous Q8H   Continuous: . 0.9 % NaCl with KCl 20 mEq / L 100 mL/hr at 05/23/13 2036   ZOX:[id number], albuterol, ALPRAZolam, ipratropium, ondansetron (ZOFRAN) IV, traZODone  Assesment: She was admitted with abdominal pain and does have a urinary tract infection. She had  sludge in the gallbladder and it is not felt that that is the source of her abdominal pain. She has relatively mild renal insufficiency. She may have appendicitis. She is a poor surgical candidate. Active Problems:   COPD (chronic obstructive pulmonary disease)   AAA (abdominal aortic aneurysm)   Abdominal pain   Nausea and vomiting   UTI (urinary tract infection)   Renal insufficiency    Plan: I will discuss with surgery consultants to try to get a better idea how long she will need to be treated with IV antibiotics et Karie Soda.    LOS: 5 days   Elenie Coven L 05/25/2013, 8:43 AM

## 2013-05-25 NOTE — Clinical Social Work Note (Signed)
Pt's daughter did not get a chance to discuss possible new placement with pt yesterday and this is something she wants to do herself. She plans to talk to her mother today and will notify CSW of decision. Surgery consulted. CSW to continue to follow.   Derenda Fennel, [place] [phone]

## 2013-05-25 NOTE — Anesthesia Postprocedure Evaluation (Addendum)
  Anesthesia Post-op Note  Patient: [name] M Laforte  Procedure(s) Performed: Procedure(s): APPENDECTOMY (N/A)  Patient Location: PACU  Anesthesia Type:General  Level of Consciousness: awake, alert  and patient cooperative  Airway and Oxygen Therapy: Patient Spontanous Breathing and Patient connected to face mask oxygen  Post-op Pain: 2 /10, mild  Post-op Assessment: Post-op Vital signs reviewed, Patient's Cardiovascular Status Stable, Respiratory Function Stable, Patent Airway, No signs of Nausea or vomiting and Pain level controlled  Post-op Vital Signs: Reviewed and stable  Complications: No apparent anesthesia complications 05/26/13  Patient at baseline mental status.  VSS.  No apparent anesthesia complications. Talked with nurse about bystolic not being ordered.  Some hypotension last night.  Nurse to check with Dr. Juanetta Gosling about reordering bystolic.

## 2013-05-26 MED ORDER — SODIUM CHLORIDE 0.9 % IV BOLUS (SEPSIS)
500.0000 mL | Freq: Once | INTRAVENOUS | Status: AC
  Administered 2013-05-26: 500 mL via INTRAVENOUS

## 2013-05-26 MED ORDER — METOPROLOL TARTRATE 1 MG/ML IV SOLN
5.0000 mg | Freq: Four times a day (QID) | INTRAVENOUS | Status: DC
  Administered 2013-05-26 – 2013-05-27 (×4): 5 mg via INTRAVENOUS
  Filled 2013-05-26 (×4): qty 5

## 2013-05-26 NOTE — Progress Notes (Signed)
UR Chart Review Completed  

## 2013-05-26 NOTE — Clinical Social Work Placement (Signed)
Clinical Social Work Department CLINICAL SOCIAL WORK PLACEMENT NOTE 05/26/2013  Patient:  [name] [name], [name] [name]  Account Number:  [id number] Admit date:  05/20/2013  Clinical Social Worker:  Derenda Fennel, LCSW  Date/time:  05/23/2013 01:30 PM  Clinical Social Work is seeking post-discharge placement for this patient at the following level of care:   ASSISTED LIVING/REST HOME   (*CSW will update this form in Epic as items are completed)   05/23/2013  Patient/family provided with [facility] Department of Clinical Social Work's list of facilities offering this level of care within the geographic area requested by the patient (or if unable, by the patient's family).  05/23/2013  Patient/family informed of their freedom to choose among providers that offer the needed level of care, that participate in Medicare, Medicaid or managed care program needed by the patient, have an available bed and are willing to accept the patient.  05/23/2013  Patient/family informed of [facility]' ownership interest in [facility], as well as of the fact that they are under no obligation to receive care at this facility.  PASARR submitted to [employer] on  PASARR number received from [employer] on   FL2 transmitted to all facilities in geographic area requested by pt/family on  05/23/2013 FL2 transmitted to all facilities within larger geographic area on   Patient informed that his/her managed care company has contracts with or will negotiate with  certain facilities, including the following:     Patient/family informed of bed offers received:  05/26/2013 Patient chooses bed at OTHER Physician recommends and patient chooses bed at  OTHER  Patient to be transferred to  on   Patient to be transferred to facility by   The following physician request were entered in Epic:   Additional Comments: Pt has existing pasarr number. Chose bed at [facility].  Derenda Fennel, [place] [phone]

## 2013-05-26 NOTE — Evaluation (Signed)
Physical Therapy Evaluation Patient Details Name: [name] [name] MRN: [id number] DOB: [birth year] Today's Date: 05/26/2013 Time: 1050-1140 PT Time Calculation (min): 50 min  PT Assessment / Plan / Recommendation Clinical Impression  Pt was seen for evaluation following surgery yesterday.  She lives at [facility] and apparently is w/c dependent, usually able to transfer independently.  Today, she was very cooperative and able to follow simple directions.  She was able to tolerate gentle ther ex in the bed and then only needed min assist to transfer bed to chair.  I would anticipate that she will be able to return to [facility] at d/c/    PT Assessment  Patient needs continued PT services    Follow Up Recommendations  Home health PT    Does the patient have the potential to tolerate intense rehabilitation      Barriers to Discharge None      Equipment Recommendations  None recommended by PT    Recommendations for Other Services     Frequency Min 3X/week    Precautions / Restrictions Precautions Precautions: Fall Restrictions Weight Bearing Restrictions: No   Pertinent Vitals/Pain       Mobility  Bed Mobility Bed Mobility: Supine to Sit;Sit to Supine Supine to Sit: HOB elevated;3: Mod assist Transfers Transfers: Stand Pivot Transfers Stand Pivot Transfers: 4: Min assist Ambulation/Gait Ambulation/Gait Assistance: Not tested (comment)    Exercises General Exercises - Lower Extremity Ankle Circles/Pumps: AROM;AAROM;Both;10 reps;Supine Heel Slides: AAROM;Both;10 reps;Supine Hip ABduction/ADduction: AAROM;Both;10 reps;Supine   PT Diagnosis: Generalized weakness;Acute pain  PT Problem List: Decreased strength;Decreased activity tolerance;Decreased mobility;Pain PT Treatment Interventions: Functional mobility training;Therapeutic activities;Therapeutic exercise;Patient/family education   PT Goals Acute Rehab PT Goals PT Goal Formulation: With patient Time For Goal Achievement:  06/09/13 Potential to Achieve Goals: Good Pt will go Supine/Side to Sit: with min assist;with HOB not 0 degrees (comment degree) PT Goal: Supine/Side to Sit - Progress: Goal set today Pt will go Sit to Supine/Side: with min assist;with HOB not 0 degrees (comment degree) PT Goal: Sit to Supine/Side - Progress: Goal set today Pt will Transfer Bed to Chair/Chair to Bed: with supervision PT Transfer Goal: Bed to Chair/Chair to Bed - Progress: Goal set today  Visit Information  Last PT Received On: 05/26/13    Subjective Data  Subjective: pt is not sure that she wants to try to get OOB today Patient Stated Goal: none stated   Prior Functioning  Home Living Available Help at Discharge: Personal care attendant Type of Home: Assisted living Home Layout: One level Home Adaptive Equipment: Wheelchair - manual Additional Comments: pt states that she is able to mobilize independently in a w/c...doesn't walk Prior Function Level of Independence: Needs assistance Needs Assistance: Bathing;Dressing;Grooming;Meal Prep;Light Housekeeping Bath: Moderate Dressing: Moderate Grooming: Moderate Meal Prep: Total Light Housekeeping: Total Able to Take Stairs?: No Driving: No Vocation: Retired Musician: No difficulties    Copywriter, advertising Arousal/Alertness: Awake/alert Behavior During Therapy: WFL for tasks assessed/performed Overall Cognitive Status: Within Functional Limits for tasks assessed    Extremity/Trunk Assessment Right Lower Extremity Assessment RLE ROM/Strength/Tone: Deficits RLE ROM/Strength/Tone Deficits: demonstrates only 3-/5 strength throughout RLE Sensation: WFL - Light Touch RLE Coordination: WFL - gross motor Left Lower Extremity Assessment LLE ROM/Strength/Tone: Deficits LLE ROM/Strength/Tone Deficits: demonstrates only 3-/5 strength throughout LLE Sensation: WFL - Light Touch LLE Coordination: WFL - gross motor   Balance Balance Balance  Assessed: Yes Dynamic Sitting Balance Dynamic Sitting - Balance Support: No upper extremity supported;Feet supported Dynamic Sitting -  Level of Assistance: 6: Modified independent (Device/Increase time)  End of Session PT - End of Session Equipment Utilized During Treatment: Gait belt Activity Tolerance: Patient tolerated treatment well Patient left: in chair;with call bell/phone within reach Nurse Communication: Mobility status  GP     Konrad Penta 05/26/2013, 11:49 AM

## 2013-05-26 NOTE — Progress Notes (Signed)
Report called to T.Ross,RN. Patient transferred to 216 in stable condition by nursing staff. Daughter called to inform of transfer but no answer.

## 2013-05-26 NOTE — Progress Notes (Signed)
eLink Physician-Brief Progress Note Patient Name: [name] M Hughlett DOB: 12-13-1935 MRN: [id number]  Date of Service  05/26/2013   HPI/Events of Note  Patient s/p appendectomy now with hypotension with BP of 80/52 (56).  In no resp distress.   eICU Interventions  Plan: 500 cc NS bolus for BP support Continue to monitor   Intervention Category Intermediate Interventions: Hypotension - evaluation and management  Brodi Kari 05/26/2013, 12:15 AM

## 2013-05-26 NOTE — Progress Notes (Signed)
1 Day Post-Op  Subjective: Some pain. Not eating much. Denies any fever chills. Per her RN patient has been moderately confused today.  Objective: Vital signs in last 24 hours: Temp:  [97.6 F (36.4 [name])-98.8 F (37.1 [name])] 98.8 F (37.1 [name]) (06/05 1600) Pulse Rate:  [38-79] 79 (06/05 1600) Resp:  [10-21] 12 (06/05 1600) BP: (75-147)/(39-109) 146/76 mmHg (06/05 1600) SpO2:  [94 %-100 %] 99 % (06/05 1600) Weight:  [74.2 kg (163 lb 9.3 oz)] 74.2 kg (163 lb 9.3 oz) (06/05 0500) Last BM Date: 05/24/13  Intake/Output from previous day: 06/04 0701 - 06/05 0700 In: 3340 [P.O.:240; I.V.:2100; IV Piggyback:1000] Out: 1075 [Urine:1075] Intake/Output this shift: Total I/O In: 1000 [I.V.:900; IV Piggyback:100] Out: 400 [Urine:400]  General appearance: no distress and Slightly confused Resp: clear to auscultation bilaterally Cardio: regular rate and rhythm GI: Quiet, soft, expected postoperative tenderness. Dressing intact.  Lab Results:  No results found for this basename: WBC, HGB, HCT, PLT,  in the last 72 hours BMET No results found for this basename: NA, K, CL, CO2, GLUCOSE, BUN, CREATININE, CALCIUM,  in the last 72 hours PT/INR No results found for this basename: LABPROT, INR,  in the last 72 hours ABG No results found for this basename: PHART, PCO2, PO2, HCO3,  in the last 72 hours  Studies/Results: Ct Abdomen Pelvis W Contrast  05/25/2013   *RADIOLOGY REPORT*  Clinical Data: Abdominal pain with vomiting since 05/20/2013. History of abdominal aortic aneurysm, coronary artery disease, peripheral vascular disease and small bowel obstruction.  CT ABDOMEN AND PELVIS WITH CONTRAST  Technique:  Multidetector CT imaging of the abdomen and pelvis was performed following the standard protocol during bolus administration of intravenous contrast.  Contrast: OMNIPAQUE IOHEXOL 300 MG/ML  SOLN  Comparison: 12/28/2012.  Findings: Findings of appendicitis with inflamed appendix with small amount  of fluid along the appendiceal tip and infiltration of surrounding fat planes.  Complex atherosclerotic type changes of the abdominal aorta including prominent calcified and noncalcified plaque, several aneurysms with largest aneurysm L3-4 level having increased in size from the recent 01/07/014 examination now with maximal transverse dimension of 5.6 x 5.4 cm versus prior maximal transverse dimension of 5.1 x 5.2 cm.  No CT evidence of periaortic bleeding. Atherosclerotic type changes contribute to the branch vessel narrowing including iliac artery narrowing.  Coronary artery calcifications.  Heart size within normal limits.  Fatty liver without focal lesion.  No calcified gallstone.  Right renal lesions suggestive of cysts although some too small to characterize.  Scarring left kidney with nonobstructing tiny renal calculi.  No worrisome splenic, pancreatic or adrenal lesion.  Severe bilateral hip destruction which may be related to degenerative changes, avascular necrosis with superimposed degenerative changes or underlying arthropathy.  Noncontrast filled views of the urinary bladder without gross abnormality.  Mild retroverted uterus.  No adnexal mass noted.  IMPRESSION: Findings of appendicitis with inflamed appendix with small amount of fluid along the appendiceal tip and infiltration of surrounding fat planes.  Abdominal aortic aneurysm L3-4 level has increased in size from the recent 01/07/014 examination now with maximal transverse dimension of 5.6 x 5.4 cm versus prior maximal transverse dimension of 5.1 x 5.2 cm.  Please see above for additional findings.  Critical Value/emergent results were called by telephone at the time of interpretation on 05/25/2013 at 12/05 p.m. to Dr. Juanetta Gosling, who verbally acknowledged these results.   Original Report Authenticated By: Lacy Duverney, M.D.    Anti-infectives: Anti-infectives   Start  Dose/Rate Route Frequency Ordered Stop   05/25/13 1800  cefTRIAXone  (ROCEPHIN) 1 g in dextrose 5 % 50 mL IVPB     1 g 100 mL/hr over 30 Minutes Intravenous Every 24 hours 05/25/13 1707     05/22/13 1200  [MAR Hold]  piperacillin-tazobactam (ZOSYN) IVPB 3.375 g  Status:  Discontinued     (On MAR Hold since 05/25/13 1355)   3.375 g 12.5 mL/hr over 240 Minutes Intravenous Every 8 hours 05/22/13 0939 05/25/13 1648   05/22/13 1000  ciprofloxacin (CIPRO) IVPB 400 mg  Status:  Discontinued     400 mg 200 mL/hr over 60 Minutes Intravenous Every 12 hours 05/22/13 0929 05/25/13 1707   05/21/13 1700  cefTRIAXone (ROCEPHIN) 1 g in dextrose 5 % 50 mL IVPB  Status:  Discontinued     1 g 100 mL/hr over 30 Minutes Intravenous Every 24 hours 05/20/13 2248 05/22/13 0929   05/20/13 1800  cefTRIAXone (ROCEPHIN) 1 g in dextrose 5 % 50 mL IVPB     1 g 100 mL/hr over 30 Minutes Intravenous  Once 05/20/13 1759 05/20/13 1850      Assessment/Plan: s/p Procedure(s): APPENDECTOMY (N/A) Overall remained stable. We continue to monitor on telemetry do feel this can be done out of the step down unit. Continue diet as tolerated. Increase activity.  LOS: 6 days    [name] [name] 05/26/2013

## 2013-05-26 NOTE — Clinical Social Work Note (Signed)
CSW spoke with pt's daughter who had a chance to discuss new placement at [facility]. Pt was agreeable and they accept bed. [facility] notified as well as MD. Pt in ICU following open appendectomy yesterday. CSW to continue to follow.  Derenda Fennel, [place] [phone]

## 2013-05-26 NOTE — Progress Notes (Signed)
Pt had BP of 89/49 at 0230. E-link MD ordered another NS bolus. Pt BP increased with no respiratory distress.

## 2013-05-26 NOTE — Progress Notes (Signed)
Subjective: She had surgery for appendicitis yesterday. She's had some moderately low blood pressures through the night which responded to fluid bolus. She says she feels okay. Her daughter says that she's having symptoms that may be related to dementia.  Objective: Vital signs in last 24 hours: Temp:  [97.5 F (36.4 C)-98.2 F (36.8 C)] 97.7 F (36.5 C) (06/05 0800) Pulse Rate:  [54-87] 71 (06/05 0800) Resp:  [10-21] 13 (06/05 0800) BP: (75-136)/(39-89) 114/62 mmHg (06/05 0800) SpO2:  [94 %-100 %] 99 % (06/05 0800) Weight:  [68.947 kg (152 lb)-74.2 kg (163 lb 9.3 oz)] 74.2 kg (163 lb 9.3 oz) (06/05 0500) Weight change: -0.318 kg (-11.2 oz) Last BM Date: 05/24/13  Intake/Output from previous day: 06/04 0701 - 06/05 0700 In: 3340 [P.O.:240; I.V.:2100; IV Piggyback:1000] Out: 1075 [Urine:1075]  PHYSICAL EXAM General appearance: alert, cooperative, mild distress and Hard of hearing Resp: rhonchi bilaterally Cardio: regular rate and rhythm, S1, S2 normal, no murmur, click, rub or gallop GI: I did not examine since she is postop one day Extremities: extremities normal, atraumatic, no cyanosis or edema  Lab Results:    Basic Metabolic Panel:  Recent Labs  16/10/96 1137  NA 137  K 4.4  CL 104  CO2 25  GLUCOSE 117*  BUN 13  CREATININE 1.12*  CALCIUM 8.8   Liver Function Tests: No results found for this basename: AST, ALT, ALKPHOS, BILITOT, PROT, ALBUMIN,  in the last 72 hours No results found for this basename: LIPASE, AMYLASE,  in the last 72 hours No results found for this basename: AMMONIA,  in the last 72 hours CBC:  Recent Labs  05/23/13 1137  WBC 6.6  HGB 11.8*  HCT 36.7  MCV 88.9  PLT 202   Cardiac Enzymes: No results found for this basename: CKTOTAL, CKMB, CKMBINDEX, TROPONINI,  in the last 72 hours BNP: No results found for this basename: PROBNP,  in the last 72 hours D-Dimer: No results found for this basename: DDIMER,  in the last 72  hours CBG: No results found for this basename: GLUCAP,  in the last 72 hours Hemoglobin A1C: No results found for this basename: HGBA1C,  in the last 72 hours Fasting Lipid Panel: No results found for this basename: CHOL, HDL, LDLCALC, TRIG, CHOLHDL, LDLDIRECT,  in the last 72 hours Thyroid Function Tests: No results found for this basename: TSH, T4TOTAL, FREET4, T3FREE, THYROIDAB,  in the last 72 hours Anemia Panel: No results found for this basename: VITAMINB12, FOLATE, FERRITIN, TIBC, IRON, RETICCTPCT,  in the last 72 hours Coagulation: No results found for this basename: LABPROT, INR,  in the last 72 hours Urine Drug Screen: Drugs of Abuse  No results found for this basename: labopia, cocainscrnur, labbenz, amphetmu, thcu, labbarb    Alcohol Level: No results found for this basename: ETH,  in the last 72 hours Urinalysis: No results found for this basename: COLORURINE, APPERANCEUR, LABSPEC, PHURINE, GLUCOSEU, HGBUR, BILIRUBINUR, KETONESUR, PROTEINUR, UROBILINOGEN, NITRITE, LEUKOCYTESUR,  in the last 72 hours Misc. Labs:  ABGS No results found for this basename: PHART, PCO2, PO2ART, TCO2, HCO3,  in the last 72 hours CULTURES Recent Results (from the past 240 hour(s))  URINE CULTURE     Status: None   Collection Time    05/20/13  2:57 PM      Result Value Range Status   Specimen Description URINE, CLEAN CATCH   Final   Special Requests NONE   Final   Culture  Setup Time 05/21/2013 00:11  Final   Colony Count >=100,000 COLONIES/ML   Final   Culture ESCHERICHIA COLI   Final   Report Status 05/24/2013 FINAL   Final   Organism ID, Bacteria ESCHERICHIA COLI   Final  MRSA PCR SCREENING     Status: Abnormal   Collection Time    05/20/13  9:54 PM      Result Value Range Status   MRSA by PCR POSITIVE (*) NEGATIVE Final   Comment:            The GeneXpert MRSA Assay (FDA     approved for NASAL specimens     only), is one component of a     comprehensive MRSA colonization      surveillance program. It is not     intended to diagnose MRSA     infection nor to guide or     monitor treatment for     MRSA infections.     RESULT CALLED TO, READ BACK BY AND VERIFIED WITH:     NEILSON,T @ 0200 ON 05/21/13 BY WOODIE,J   Studies/Results: Ct Abdomen Pelvis W Contrast  05/25/2013   *RADIOLOGY REPORT*  Clinical Data: Abdominal pain with vomiting since 05/20/2013. History of abdominal aortic aneurysm, coronary artery disease, peripheral vascular disease and small bowel obstruction.  CT ABDOMEN AND PELVIS WITH CONTRAST  Technique:  Multidetector CT imaging of the abdomen and pelvis was performed following the standard protocol during bolus administration of intravenous contrast.  Contrast: OMNIPAQUE IOHEXOL 300 MG/ML  SOLN  Comparison: 12/28/2012.  Findings: Findings of appendicitis with inflamed appendix with small amount of fluid along the appendiceal tip and infiltration of surrounding fat planes.  Complex atherosclerotic type changes of the abdominal aorta including prominent calcified and noncalcified plaque, several aneurysms with largest aneurysm L3-4 level having increased in size from the recent 01/07/014 examination now with maximal transverse dimension of 5.6 x 5.4 cm versus prior maximal transverse dimension of 5.1 x 5.2 cm.  No CT evidence of periaortic bleeding. Atherosclerotic type changes contribute to the branch vessel narrowing including iliac artery narrowing.  Coronary artery calcifications.  Heart size within normal limits.  Fatty liver without focal lesion.  No calcified gallstone.  Right renal lesions suggestive of cysts although some too small to characterize.  Scarring left kidney with nonobstructing tiny renal calculi.  No worrisome splenic, pancreatic or adrenal lesion.  Severe bilateral hip destruction which may be related to degenerative changes, avascular necrosis with superimposed degenerative changes or underlying arthropathy.  Noncontrast filled views of  the urinary bladder without gross abnormality.  Mild retroverted uterus.  No adnexal mass noted.  IMPRESSION: Findings of appendicitis with inflamed appendix with small amount of fluid along the appendiceal tip and infiltration of surrounding fat planes.  Abdominal aortic aneurysm L3-4 level has increased in size from the recent 01/07/014 examination now with maximal transverse dimension of 5.6 x 5.4 cm versus prior maximal transverse dimension of 5.1 x 5.2 cm.  Please see above for additional findings.  Critical Value/emergent results were called by telephone at the time of interpretation on 05/25/2013 at 12/05 p.m. to Dr. Juanetta Gosling, who verbally acknowledged these results.   Original Report Authenticated By: Lacy Duverney, M.D.    Medications:  Scheduled: . acidophilus  2 capsule Oral Daily  . antiseptic oral rinse  15 mL Mouth Rinse BID  . cefTRIAXone (ROCEPHIN)  IV  1 g Intravenous Q24H  . diltiazem  240 mg Oral Daily  . enoxaparin (LOVENOX) injection  40  mg Subcutaneous Q24H  . famotidine (PEPCID) IV  20 mg Intravenous Q24H  . sodium chloride  3 mL Intravenous Q12H   Continuous: . 0.9 % NaCl with KCl 20 mEq / L 100 mL/hr at 05/26/13 0451   JXB:JYNWGNFAOZHYQ, albuterol, ALPRAZolam, HYDROcodone-acetaminophen, ipratropium, ondansetron (ZOFRAN) IV, traZODone  Assesment: She was admitted with abdominal pain which appears to be from her appendicitis. She has had surgery for that. Her abdominal aortic aneurysm is slightly larger. She has COPD which is stable. She has renal insufficiency which is stable. Active Problems:   COPD (chronic obstructive pulmonary disease)   AAA (abdominal aortic aneurysm)   Abdominal pain   Nausea and vomiting   UTI (urinary tract infection)   Renal insufficiency   Chronic kidney disease (CKD) stage G3a/A1, moderately decreased glomerular filtration rate (GFR) between 45-59 mL/min/1.73 square meter and albuminuria creatinine ratio less than 30 mg/g    Plan:  Continue current treatments    LOS: 6 days   Susumu Hackler L 05/26/2013, 8:13 AM

## 2013-05-27 ENCOUNTER — Encounter (HOSPITAL_COMMUNITY): Payer: Self-pay | Admitting: General Surgery

## 2013-05-27 LAB — CBC WITH DIFFERENTIAL/PLATELET
Basophils Absolute: 0 10*3/uL (ref 0.0–0.1)
Basophils Relative: 0 % (ref 0–1)
Eosinophils Absolute: 0.1 10*3/uL (ref 0.0–0.7)
HCT: 31.8 % — ABNORMAL LOW (ref 36.0–46.0)
MCH: 29 pg (ref 26.0–34.0)
MCHC: 31.4 g/dL (ref 30.0–36.0)
Monocytes Absolute: 0.9 10*3/uL (ref 0.1–1.0)
Neutro Abs: 3.3 10*3/uL (ref 1.7–7.7)
Neutrophils Relative %: 53 % (ref 43–77)
RDW: 15.8 % — ABNORMAL HIGH (ref 11.5–15.5)

## 2013-05-27 LAB — BASIC METABOLIC PANEL
BUN: 7 mg/dL (ref 6–23)
Chloride: 109 mEq/L (ref 96–112)
Creatinine, Ser: 0.71 mg/dL (ref 0.50–1.10)
GFR calc Af Amer: >90 mL/min (ref >90)
GFR calc non Af Amer: 82 mL/min — ABNORMAL LOW (ref >90)

## 2013-05-27 LAB — TROPONIN I: Troponin I: <0.3 ng/mL

## 2013-05-27 LAB — CREATININE, SERUM
GFR calc Af Amer: >90 mL/min (ref >90)
GFR calc non Af Amer: 85 mL/min — ABNORMAL LOW (ref >90)

## 2013-05-27 MED ORDER — SODIUM CHLORIDE 0.9 % IV BOLUS (SEPSIS)
1000.0000 mL | Freq: Once | INTRAVENOUS | Status: AC
  Administered 2013-05-27: 1000 mL via INTRAVENOUS

## 2013-05-27 NOTE — Progress Notes (Signed)
Physical Therapy Treatment Patient Details Name: [name] [name] MRN: [id number] DOB: 1934/12/29 Today's Date: 05/27/2013 Time: 3086-5784 PT Time Calculation (min): 31 min  PT Assessment / Plan / Recommendation Comments on Treatment Session  Pt is up in chair at time of my arrival, no pain reported.  She was able to tolerate ther ex while up in chair and then was able to practice bed to chair transfers.  She continues to need min assist, but she should be able to regain independence with this.    Follow Up Recommendations        Does the patient have the potential to tolerate intense rehabilitation     Barriers to Discharge        Equipment Recommendations       Recommendations for Other Services    Frequency     Plan Discharge plan remains appropriate;Frequency remains appropriate    Precautions / Restrictions     Pertinent Vitals/Pain     Mobility  Transfers Stand Pivot Transfers: 4: Min assist Details for Transfer Assistance: pt instructed in placement of hands and in the most efficient direction of turning, but she is unable to recall this information on teach back Ambulation/Gait Ambulation/Gait Assistance: Not tested (comment)    Exercises General Exercises - Lower Extremity Long Arc Quad: AROM;Both;10 reps;Seated Hip ABduction/ADduction: Strengthening;Both;10 reps;Seated Hip Flexion/Marching: AROM;Both;10 reps;Seated Toe Raises: AROM;Both;10 reps;Seated   PT Diagnosis:    PT Problem List:   PT Treatment Interventions:     PT Goals Acute Rehab PT Goals PT Transfer Goal: Bed to Chair/Chair to Bed - Progress: Progressing toward goal  Visit Information  Last PT Received On: 05/27/13    Subjective Data  Subjective: I'm hungry   Cognition       Balance     End of Session PT - End of Session Equipment Utilized During Treatment: Gait belt Activity Tolerance: Patient tolerated treatment well Patient left: in chair;with call bell/phone within reach;with  chair alarm set Nurse Communication: Mobility status   GP     Myrlene Broker L 05/27/2013, 1:16 PM

## 2013-05-27 NOTE — Progress Notes (Signed)
Subjective: She says she feels a little bit better but was unable to eat anything. She still has some abdominal pain.  Objective: Vital signs in last 24 hours: Temp:  [97.9 F (36.6 C)-98.8 F (37.1 C)] 97.9 F (36.6 C) (06/06 0537) Pulse Rate:  [38-79] 64 (06/06 0537) Resp:  [12-18] 18 (06/06 0537) BP: (106-147)/(50-109) 106/50 mmHg (06/06 0537) SpO2:  [98 %-100 %] 98 % (06/06 0719) Weight:  [73.12 kg (161 lb 3.2 oz)] 73.12 kg (161 lb 3.2 oz) (06/06 0500) Weight change: 4.173 kg (9 lb 3.2 oz) Last BM Date: 05/24/13  Intake/Output from previous day: 06/05 0701 - 06/06 0700 In: 2486.7 [I.V.:2286.7; IV Piggyback:200] Out: 600 [Urine:600]  PHYSICAL EXAM General appearance: alert, cooperative and mild distress Resp: clear to auscultation bilaterally Cardio: regular rate and rhythm, S1, S2 normal, no murmur, click, rub or gallop GI: Mildly tender Extremities: extremities normal, atraumatic, no cyanosis or edema  Lab Results:    Basic Metabolic Panel:  Recent Labs  16/10/96 0433  CREATININE 0.65   Liver Function Tests: No results found for this basename: AST, ALT, ALKPHOS, BILITOT, PROT, ALBUMIN,  in the last 72 hours No results found for this basename: LIPASE, AMYLASE,  in the last 72 hours No results found for this basename: AMMONIA,  in the last 72 hours CBC: No results found for this basename: WBC, NEUTROABS, HGB, HCT, MCV, PLT,  in the last 72 hours Cardiac Enzymes: No results found for this basename: CKTOTAL, CKMB, CKMBINDEX, TROPONINI,  in the last 72 hours BNP: No results found for this basename: PROBNP,  in the last 72 hours D-Dimer: No results found for this basename: DDIMER,  in the last 72 hours CBG: No results found for this basename: GLUCAP,  in the last 72 hours Hemoglobin A1C: No results found for this basename: HGBA1C,  in the last 72 hours Fasting Lipid Panel: No results found for this basename: CHOL, HDL, LDLCALC, TRIG, CHOLHDL, LDLDIRECT,  in the  last 72 hours Thyroid Function Tests: No results found for this basename: TSH, T4TOTAL, FREET4, T3FREE, THYROIDAB,  in the last 72 hours Anemia Panel: No results found for this basename: VITAMINB12, FOLATE, FERRITIN, TIBC, IRON, RETICCTPCT,  in the last 72 hours Coagulation: No results found for this basename: LABPROT, INR,  in the last 72 hours Urine Drug Screen: Drugs of Abuse  No results found for this basename: labopia, cocainscrnur, labbenz, amphetmu, thcu, labbarb    Alcohol Level: No results found for this basename: ETH,  in the last 72 hours Urinalysis: No results found for this basename: COLORURINE, APPERANCEUR, LABSPEC, PHURINE, GLUCOSEU, HGBUR, BILIRUBINUR, KETONESUR, PROTEINUR, UROBILINOGEN, NITRITE, LEUKOCYTESUR,  in the last 72 hours Misc. Labs:  ABGS No results found for this basename: PHART, PCO2, PO2ART, TCO2, HCO3,  in the last 72 hours CULTURES Recent Results (from the past 240 hour(s))  URINE CULTURE     Status: None   Collection Time    05/20/13  2:57 PM      Result Value Range Status   Specimen Description URINE, CLEAN CATCH   Final   Special Requests NONE   Final   Culture  Setup Time 05/21/2013 00:11   Final   Colony Count >=100,000 COLONIES/ML   Final   Culture ESCHERICHIA COLI   Final   Report Status 05/24/2013 FINAL   Final   Organism ID, Bacteria ESCHERICHIA COLI   Final  MRSA PCR SCREENING     Status: Abnormal   Collection Time    05/20/13  9:54 PM      Result Value Range Status   MRSA by PCR POSITIVE (*) NEGATIVE Final   Comment:            The GeneXpert MRSA Assay (FDA     approved for NASAL specimens     only), is one component of a     comprehensive MRSA colonization     surveillance program. It is not     intended to diagnose MRSA     infection nor to guide or     monitor treatment for     MRSA infections.     RESULT CALLED TO, READ BACK BY AND VERIFIED WITH:     NEILSON,T @ 0200 ON 05/21/13 BY WOODIE,J   Studies/Results: Ct Abdomen  Pelvis W Contrast  05/25/2013   *RADIOLOGY REPORT*  Clinical Data: Abdominal pain with vomiting since 05/20/2013. History of abdominal aortic aneurysm, coronary artery disease, peripheral vascular disease and small bowel obstruction.  CT ABDOMEN AND PELVIS WITH CONTRAST  Technique:  Multidetector CT imaging of the abdomen and pelvis was performed following the standard protocol during bolus administration of intravenous contrast.  Contrast: OMNIPAQUE IOHEXOL 300 MG/ML  SOLN  Comparison: 12/28/2012.  Findings: Findings of appendicitis with inflamed appendix with small amount of fluid along the appendiceal tip and infiltration of surrounding fat planes.  Complex atherosclerotic type changes of the abdominal aorta including prominent calcified and noncalcified plaque, several aneurysms with largest aneurysm L3-4 level having increased in size from the recent 01/07/014 examination now with maximal transverse dimension of 5.6 x 5.4 cm versus prior maximal transverse dimension of 5.1 x 5.2 cm.  No CT evidence of periaortic bleeding. Atherosclerotic type changes contribute to the branch vessel narrowing including iliac artery narrowing.  Coronary artery calcifications.  Heart size within normal limits.  Fatty liver without focal lesion.  No calcified gallstone.  Right renal lesions suggestive of cysts although some too small to characterize.  Scarring left kidney with nonobstructing tiny renal calculi.  No worrisome splenic, pancreatic or adrenal lesion.  Severe bilateral hip destruction which may be related to degenerative changes, avascular necrosis with superimposed degenerative changes or underlying arthropathy.  Noncontrast filled views of the urinary bladder without gross abnormality.  Mild retroverted uterus.  No adnexal mass noted.  IMPRESSION: Findings of appendicitis with inflamed appendix with small amount of fluid along the appendiceal tip and infiltration of surrounding fat planes.  Abdominal aortic  aneurysm L3-4 level has increased in size from the recent 01/07/014 examination now with maximal transverse dimension of 5.6 x 5.4 cm versus prior maximal transverse dimension of 5.1 x 5.2 cm.  Please see above for additional findings.  Critical Value/emergent results were called by telephone at the time of interpretation on 05/25/2013 at 12/05 p.m. to Dr. Juanetta Gosling, who verbally acknowledged these results.   Original Report Authenticated By: Lacy Duverney, M.D.    Medications:  Prior to Admission:  Prescriptions prior to admission  Medication Sig Dispense Refill  . ALPRAZolam (XANAX) 0.5 MG tablet Take 0.5 mg by mouth every 8 (eight) hours as needed.      . Choline Fenofibrate (FENOFIBRIC ACID) 135 MG CPDR Take 1 tablet by mouth daily.      Marland Kitchen diltiazem (TIAZAC) 240 MG 24 hr capsule Take 240 mg by mouth daily.      . divalproex (DEPAKOTE SPRINKLE) 125 MG capsule Take 125-250 mg by mouth 2 (two) times daily. Take two capsules in the morning and take one capsule at  bedtime      . furosemide (LASIX) 40 MG tablet Take 40 mg by mouth daily.      Marland Kitchen HYDROcodone-acetaminophen (NORCO) 10-325 MG per tablet Take 1 tablet by mouth 3 (three) times daily as needed.      . mirtazapine (REMERON) 15 MG tablet Take 15 mg by mouth at bedtime.      . nebivolol (BYSTOLIC) 5 MG tablet Take 5 mg by mouth daily.      Marland Kitchen omeprazole (PRILOSEC) 20 MG capsule Take 20 mg by mouth daily.      . rosuvastatin (CRESTOR) 10 MG tablet Take 10 mg by mouth at bedtime.      Marland Kitchen tiotropium (SPIRIVA) 18 MCG inhalation capsule Place 18 mcg into inhaler and inhale daily.      . traMADol (ULTRAM) 50 MG tablet Take 50 mg by mouth every morning.       Marland Kitchen acetaminophen (Q-PAP) 500 MG tablet Take 500 mg by mouth every 4 (four) hours as needed. For pain      . albuterol (PROVENTIL) (2.5 MG/3ML) 0.083% nebulizer solution Take 2.5 mg by nebulization every 6 (six) hours as needed. For shortness of breath       Scheduled: . acidophilus  2 capsule Oral  Daily  . antiseptic oral rinse  15 mL Mouth Rinse BID  . cefTRIAXone (ROCEPHIN)  IV  1 g Intravenous Q24H  . diltiazem  240 mg Oral Daily  . enoxaparin (LOVENOX) injection  40 mg Subcutaneous Q24H  . famotidine (PEPCID) IV  20 mg Intravenous Q24H  . metoprolol  5 mg Intravenous Q6H  . sodium chloride  3 mL Intravenous Q12H   Continuous: . 0.9 % NaCl with KCl 20 mEq / L 100 mL/hr at 05/27/13 1610   RUE:[id number], albuterol, ALPRAZolam, HYDROcodone-acetaminophen, ipratropium, ondansetron (ZOFRAN) IV, traZODone  Assesment: She had an appendectomy 2 days ago. She is still not eating. She says she thinks she might do better with full liquids rather than clear liquids. Otherwise she looks better. Her COPD is stable. Her abdominal aortic aneurysm had grown somewhat from the last scan socially to get back with her vascular surgeon Active Problems:   COPD (chronic obstructive pulmonary disease)   AAA (abdominal aortic aneurysm)   Abdominal pain   Nausea and vomiting   UTI (urinary tract infection)   Renal insufficiency   Chronic kidney disease (CKD) stage G3a/A1, moderately decreased glomerular filtration rate (GFR) between 45-59 mL/min/1.73 square meter and albuminuria creatinine ratio less than 30 mg/g    Plan: Advance diet and see how she does with that    LOS: 7 days   Jalilah Wiltsie L 05/27/2013, 8:44 AM

## 2013-05-27 NOTE — Progress Notes (Addendum)
Patients daughter called to make aware of the patients change of status and transfer back to ICU stepdown. Called and no answer.

## 2013-05-27 NOTE — Clinical Social Work Note (Addendum)
Diet advanced today. Pt potentially ready over weekend per MD. CSW spoke with pt's daughter, [facility], and [facility]. All parties are agreeable to plan that if pt is ready over weekend, she will return to [facility] as [facility] cannot accept pt until Monday. Pharmacy aware facility will require sufficient supply of any new medications through Monday. Trudy at [facility] is willing to waive 14 day notice requirement. [facility] will follow up with daughter to work on transfer if she returns to [facility]. MD aware of above.  Derenda Fennel, [place] [phone]

## 2013-05-27 NOTE — Progress Notes (Signed)
Received alerts that patients heart rate had dropped to the 30's per central telemetry. Pt was up sitting in the chair drowsy, but arousable. No complaints. Manual BP 65/22 HR 30's. MD paged to make aware in patients change of status.

## 2013-05-27 NOTE — Progress Notes (Signed)
NAME:  Kratz, [name]              ACCOUNT NO.:  [id number]  MEDICAL RECORD NO.:  [id number]  LOCATION:  IC09                          FACILITY:  [facility]  PHYSICIAN:  Robynne Roat L. Juanetta Gosling, M.D.DATE OF BIRTH:  [birth year]  DATE OF PROCEDURE: DATE OF DISCHARGE:                                PROGRESS NOTE   I was called because she developed bradycardia.  She had apparently just received metoprolol and Xanax.  Within a few minutes, her bradycardia down into the 30s.  She did not appear to be quite as responsive as normal.  She has been on metoprolol and diltiazem for several years from her cardiologist.  She has not had any problems with bradycardia previously.  She was transferred to the Step-Down Unit with IV fluids. Laboratory work is pending including a troponin.  She now looks more comfortable.  Heart rates in the 40s, looks like sinus bradycardia. Blood pressures in the 80s to 90s systolic.  She has a known abdominal aortic aneurysm, but her pulses in her groin are okay and although she has some abdominal pain.  It appears to be more postoperative pain from her appendectomy.  My assessment then is that she probably has developed difficulty tolerating both diltiazem and metoprolol and both of those will be discontinued.  She will have laboratory work to investigate other causes.  I think this is probably as mentioned, a combination of two medications that can cause bradycardia plus postop state plus-minus possibility of a vagal reaction because she has had some nausea.     Nello Corro L. Juanetta Gosling, M.D.     ELH/MEDQ  D:  05/27/2013  T:  05/27/2013  Job:  [id number]

## 2013-05-27 NOTE — Progress Notes (Signed)
Dr. Juanetta Gosling made aware of the patients change of status. Pt to be transferred to stepdown, 1 liter of normal saline bolus to be given, stat EKG ordered, scheduled metoprolol discontinued. Report given Tobi Bastos, RN in ICU.

## 2013-05-27 NOTE — Progress Notes (Signed)
2 Days Post-Op  Subjective: Patient seen earlier this morning. At that time she denied any complaints of any significant abdominal pain. She is still requiring pain medication. Patient states positive appetite but per the patient's nurse she is still been eating very limited amounts.  Objective: Vital signs in last 24 hours: Temp:  [97.9 F (36.6 C)-98.8 F (37.1 C)] 97.9 F (36.6 C) (06/06 0537) Pulse Rate:  [40-79] 45 (06/06 1415) Resp:  [9-18] 10 (06/06 1415) BP: (84-146)/(40-95) 91/42 mmHg (06/06 1415) SpO2:  [96 %-100 %] 98 % (06/06 1415) Weight:  [73.12 kg (161 lb 3.2 oz)] 73.12 kg (161 lb 3.2 oz) (06/06 0500) Last BM Date: 05/24/13  Intake/Output from previous day: 06/05 0701 - 06/06 0700 In: 2486.7 [I.V.:2286.7; IV Piggyback:200] Out: 600 [Urine:600] Intake/Output this shift: Total I/O In: 833.3 [P.O.:120; I.V.:713.3] Out: -   General appearance: no distress GI: Positive bowel sounds, soft, moderate expected postoperative tenderness. Incision is clean dry and intact.  Lab Results:   Recent Labs  05/27/13 1356  WBC 6.1  HGB 10.0*  HCT 31.8*  PLT 182   BMET  Recent Labs  05/27/13 0433 05/27/13 1356  NA  --  138  K  --  4.4  CL  --  109  CO2  --  22  GLUCOSE  --  103*  BUN  --  7  CREATININE 0.65 0.71  CALCIUM  --  8.4   PT/INR No results found for this basename: LABPROT, INR,  in the last 72 hours ABG No results found for this basename: PHART, PCO2, PO2, HCO3,  in the last 72 hours  Studies/Results: No results found.  Anti-infectives: Anti-infectives   Start     Dose/Rate Route Frequency Ordered Stop   05/25/13 1800  cefTRIAXone (ROCEPHIN) 1 g in dextrose 5 % 50 mL IVPB     1 g 100 mL/hr over 30 Minutes Intravenous Every 24 hours 05/25/13 1707     05/22/13 1200  [MAR Hold]  piperacillin-tazobactam (ZOSYN) IVPB 3.375 g  Status:  Discontinued     (On MAR Hold since 05/25/13 1355)   3.375 g 12.5 mL/hr over 240 Minutes Intravenous Every 8 hours  05/22/13 0939 05/25/13 1648   05/22/13 1000  ciprofloxacin (CIPRO) IVPB 400 mg  Status:  Discontinued     400 mg 200 mL/hr over 60 Minutes Intravenous Every 12 hours 05/22/13 0929 05/25/13 1707   05/21/13 1700  cefTRIAXone (ROCEPHIN) 1 g in dextrose 5 % 50 mL IVPB  Status:  Discontinued     1 g 100 mL/hr over 30 Minutes Intravenous Every 24 hours 05/20/13 2248 05/22/13 0929   05/20/13 1800  cefTRIAXone (ROCEPHIN) 1 g in dextrose 5 % 50 mL IVPB     1 g 100 mL/hr over 30 Minutes Intravenous  Once 05/20/13 1759 05/20/13 1850      Assessment/Plan: s/p Procedure(s): APPENDECTOMY (N/A) Continue to advance diet as tolerated. Increase activity. Okay to discontinue antibiotics at this time from my standpoint. Hopeful discharge in the next 48-72 hours depending patient's continued progress  LOS: 7 days    [name] [name] C 05/27/2013

## 2013-05-28 DIAGNOSIS — R001 Bradycardia, unspecified: Secondary | ICD-10-CM | POA: Diagnosis not present

## 2013-05-28 DIAGNOSIS — K3580 Unspecified acute appendicitis: Secondary | ICD-10-CM | POA: Diagnosis present

## 2013-05-28 MED ORDER — FAMOTIDINE 20 MG PO TABS
20.0000 mg | ORAL_TABLET | Freq: Every day | ORAL | Status: DC
Start: 2013-05-28 — End: 2013-05-31
  Administered 2013-05-28 – 2013-05-31 (×4): 20 mg via ORAL
  Filled 2013-05-28 (×4): qty 1

## 2013-05-28 MED ORDER — FUROSEMIDE 10 MG/ML IJ SOLN: 20.0000 mg | Freq: Once | INTRAMUSCULAR | Status: DC

## 2013-05-28 NOTE — Progress Notes (Signed)
The patient is receiving Pepcid by the intravenous route.  Based on criteria approved by the Pharmacy and Therapeutics Committee and the Medical Executive Committee, the medication is being converted to the equivalent oral dose form.  These criteria include: -No Active GI bleeding -Able to tolerate diet of full liquids (or better) or tube feeding OR able to tolerate other medications by the oral or enteral route  If you have any questions about this conversion, please contact the Pharmacy Department (ext [phone]).  Thank you.  Mady Gemma, [facility] 05/28/2013 10:03 AM

## 2013-05-28 NOTE — Progress Notes (Signed)
Subjective: She feels much better. Her heart rate is in the 70s and looks like sinus rhythm. Her blood pressure has come up but she has had some blood pressures that are fairly low still. She still complained of abdominal discomfort. She was able to drink some fluids this morning  Objective: Vital signs in last 24 hours: Temp:  [97.6 F (36.4 C)-98.1 F (36.7 C)] 97.8 F (36.6 C) (06/07 0400) Pulse Rate:  [40-100] 76 (06/07 0700) Resp:  [9-22] 22 (06/07 0700) BP: (84-147)/(40-89) 98/66 mmHg (06/07 0700) SpO2:  [77 %-100 %] 100 % (06/07 0700) Weight:  [74.2 kg (163 lb 9.3 oz)] 74.2 kg (163 lb 9.3 oz) (06/07 0500) Weight change: 1.08 kg (2 lb 6.1 oz) Last BM Date: 05/24/13  Intake/Output from previous day: 06/06 0701 - 06/07 0700 In: 3533.3 [P.O.:120; I.V.:2413.3; IV Piggyback:1000] Out: 100 [Urine:100]  PHYSICAL EXAM General appearance: alert, cooperative and mild distress Resp: clear to auscultation bilaterally Cardio: regular rate and rhythm, S1, S2 normal, no murmur, click, rub or gallop GI: She still has some tenderness most marked in the right lower quadrant Extremities: extremities normal, atraumatic, no cyanosis or edema  Lab Results:    Basic Metabolic Panel:  Recent Labs  69/62/95 0433 05/27/13 1356  NA  --  138  K  --  4.4  CL  --  109  CO2  --  22  GLUCOSE  --  103*  BUN  --  7  CREATININE 0.65 0.71  CALCIUM  --  8.4   Liver Function Tests: No results found for this basename: AST, ALT, ALKPHOS, BILITOT, PROT, ALBUMIN,  in the last 72 hours No results found for this basename: LIPASE, AMYLASE,  in the last 72 hours No results found for this basename: AMMONIA,  in the last 72 hours CBC:  Recent Labs  05/27/13 1356  WBC 6.1  NEUTROABS 3.3  HGB 10.0*  HCT 31.8*  MCV 92.2  PLT 182   Cardiac Enzymes:  Recent Labs  05/27/13 1356 05/27/13 1911 05/28/13 0127  TROPONINI <0.30 <0.30 <0.30   BNP: No results found for this basename: PROBNP,  in  the last 72 hours D-Dimer: No results found for this basename: DDIMER,  in the last 72 hours CBG: No results found for this basename: GLUCAP,  in the last 72 hours Hemoglobin A1C: No results found for this basename: HGBA1C,  in the last 72 hours Fasting Lipid Panel: No results found for this basename: CHOL, HDL, LDLCALC, TRIG, CHOLHDL, LDLDIRECT,  in the last 72 hours Thyroid Function Tests: No results found for this basename: TSH, T4TOTAL, FREET4, T3FREE, THYROIDAB,  in the last 72 hours Anemia Panel: No results found for this basename: VITAMINB12, FOLATE, FERRITIN, TIBC, IRON, RETICCTPCT,  in the last 72 hours Coagulation: No results found for this basename: LABPROT, INR,  in the last 72 hours Urine Drug Screen: Drugs of Abuse  No results found for this basename: labopia, cocainscrnur, labbenz, amphetmu, thcu, labbarb    Alcohol Level: No results found for this basename: ETH,  in the last 72 hours Urinalysis: No results found for this basename: COLORURINE, APPERANCEUR, LABSPEC, PHURINE, GLUCOSEU, HGBUR, BILIRUBINUR, KETONESUR, PROTEINUR, UROBILINOGEN, NITRITE, LEUKOCYTESUR,  in the last 72 hours Misc. Labs:  ABGS No results found for this basename: PHART, PCO2, PO2ART, TCO2, HCO3,  in the last 72 hours CULTURES Recent Results (from the past 240 hour(s))  URINE CULTURE     Status: None   Collection Time    05/20/13  2:57 PM      Result Value Range Status   Specimen Description URINE, CLEAN CATCH   Final   Special Requests NONE   Final   Culture  Setup Time 05/21/2013 00:11   Final   Colony Count >=100,000 COLONIES/ML   Final   Culture ESCHERICHIA COLI   Final   Report Status 05/24/2013 FINAL   Final   Organism ID, Bacteria ESCHERICHIA COLI   Final  MRSA PCR SCREENING     Status: Abnormal   Collection Time    05/20/13  9:54 PM      Result Value Range Status   MRSA by PCR POSITIVE (*) NEGATIVE Final   Comment:            The GeneXpert MRSA Assay (FDA     approved for  NASAL specimens     only), is one component of a     comprehensive MRSA colonization     surveillance program. It is not     intended to diagnose MRSA     infection nor to guide or     monitor treatment for     MRSA infections.     RESULT CALLED TO, READ BACK BY AND VERIFIED WITH:     NEILSON,T @ 0200 ON 05/21/13 BY WOODIE,J   Studies/Results: No results found.  Medications:  Prior to Admission:  Prescriptions prior to admission  Medication Sig Dispense Refill  . ALPRAZolam (XANAX) 0.5 MG tablet Take 0.5 mg by mouth every 8 (eight) hours as needed.      . Choline Fenofibrate (FENOFIBRIC ACID) 135 MG CPDR Take 1 tablet by mouth daily.      Marland Kitchen diltiazem (TIAZAC) 240 MG 24 hr capsule Take 240 mg by mouth daily.      . divalproex (DEPAKOTE SPRINKLE) 125 MG capsule Take 125-250 mg by mouth 2 (two) times daily. Take two capsules in the morning and take one capsule at bedtime      . furosemide (LASIX) 40 MG tablet Take 40 mg by mouth daily.      Marland Kitchen HYDROcodone-acetaminophen (NORCO) 10-325 MG per tablet Take 1 tablet by mouth 3 (three) times daily as needed.      . mirtazapine (REMERON) 15 MG tablet Take 15 mg by mouth at bedtime.      . nebivolol (BYSTOLIC) 5 MG tablet Take 5 mg by mouth daily.      Marland Kitchen omeprazole (PRILOSEC) 20 MG capsule Take 20 mg by mouth daily.      . rosuvastatin (CRESTOR) 10 MG tablet Take 10 mg by mouth at bedtime.      Marland Kitchen tiotropium (SPIRIVA) 18 MCG inhalation capsule Place 18 mcg into inhaler and inhale daily.      . traMADol (ULTRAM) 50 MG tablet Take 50 mg by mouth every morning.       Marland Kitchen acetaminophen (Q-PAP) 500 MG tablet Take 500 mg by mouth every 4 (four) hours as needed. For pain      . albuterol (PROVENTIL) (2.5 MG/3ML) 0.083% nebulizer solution Take 2.5 mg by nebulization every 6 (six) hours as needed. For shortness of breath       Scheduled: . acidophilus  2 capsule Oral Daily  . antiseptic oral rinse  15 mL Mouth Rinse BID  . cefTRIAXone (ROCEPHIN)  IV  1 g  Intravenous Q24H  . diltiazem  240 mg Oral Daily  . enoxaparin (LOVENOX) injection  40 mg Subcutaneous Q24H  . famotidine (PEPCID) IV  20 mg Intravenous Q24H  .  sodium chloride  3 mL Intravenous Q12H   Continuous: . 0.9 % NaCl with KCl 20 mEq / L 100 mL/hr at 05/27/13 2346   WUJ:[id number], albuterol, ALPRAZolam, HYDROcodone-acetaminophen, ipratropium, ondansetron (ZOFRAN) IV, traZODone  Assesment: She was admitted with abdominal pain and this has been determined to be appendicitis and she had surgery. She had done relatively well postoperatively but was not eating very well when she developed bradycardia. She was on 2 medications for her blood pressure and cardiac problems that could cause her heart rate dropped and she may have had a vagal component as well. She is much improved this morning. She may need treatment for her blood pressure but she still has been somewhat labile so I'm not going to start that quite yet. She is still recovering from her appendectomy is still not doing as well as I would like as far as eating. Active Problems:   COPD (chronic obstructive pulmonary disease)   AAA (abdominal aortic aneurysm)   Abdominal pain   Nausea and vomiting   UTI (urinary tract infection)   Renal insufficiency   Chronic kidney disease (CKD) stage G3a/A1, moderately decreased glomerular filtration rate (GFR) between 45-59 mL/min/1.73 square meter and albuminuria creatinine ratio less than 30 mg/g    Plan: Continue current treatments    LOS: 8 days   Brittay Mogle L 05/28/2013, 9:18 AM

## 2013-05-28 NOTE — Progress Notes (Signed)
3 Days Post-Op  Subjective: Events from yesterday noted. Patient without complaints today. Appetite remains poor. Positive bowel function  Objective: Vital signs in last 24 hours: Temp:  [97.6 F (36.4 C)-98.1 F (36.7 C)] 98.1 F (36.7 C) (06/07 1145) Pulse Rate:  [44-100] 66 (06/07 1500) Resp:  [9-22] 9 (06/07 1500) BP: (87-163)/(47-105) 145/73 mmHg (06/07 1500) SpO2:  [89 %-100 %] 100 % (06/07 1540) Weight:  [74.2 kg (163 lb 9.3 oz)] 74.2 kg (163 lb 9.3 oz) (06/07 0500) Last BM Date: 05/24/13  Intake/Output from previous day: 06/06 0701 - 06/07 0700 In: 3533.3 [P.O.:120; I.V.:2413.3; IV Piggyback:1000] Out: 100 [Urine:100] Intake/Output this shift: Total I/O In: 720 [P.O.:120; I.V.:600] Out: 150 [Urine:150]  General appearance: no distress and Slightly confused GI: Positive bowel sounds, soft, flat, expected postoperative tenderness. Midline incision is clean dry and intact. Staples are present.  Lab Results:   Recent Labs  05/27/13 1356  WBC 6.1  HGB 10.0*  HCT 31.8*  PLT 182   BMET  Recent Labs  05/27/13 0433 05/27/13 1356  NA  --  138  K  --  4.4  CL  --  109  CO2  --  22  GLUCOSE  --  103*  BUN  --  7  CREATININE 0.65 0.71  CALCIUM  --  8.4   PT/INR No results found for this basename: LABPROT, INR,  in the last 72 hours ABG No results found for this basename: PHART, PCO2, PO2, HCO3,  in the last 72 hours  Studies/Results: No results found.  Anti-infectives: Anti-infectives   Start     Dose/Rate Route Frequency Ordered Stop   05/25/13 1800  cefTRIAXone (ROCEPHIN) 1 g in dextrose 5 % 50 mL IVPB     1 g 100 mL/hr over 30 Minutes Intravenous Every 24 hours 05/25/13 1707     05/22/13 1200  [MAR Hold]  piperacillin-tazobactam (ZOSYN) IVPB 3.375 g  Status:  Discontinued     (On MAR Hold since 05/25/13 1355)   3.375 g 12.5 mL/hr over 240 Minutes Intravenous Every 8 hours 05/22/13 0939 05/25/13 1648   05/22/13 1000  ciprofloxacin (CIPRO) IVPB 400  mg  Status:  Discontinued     400 mg 200 mL/hr over 60 Minutes Intravenous Every 12 hours 05/22/13 0929 05/25/13 1707   05/21/13 1700  cefTRIAXone (ROCEPHIN) 1 g in dextrose 5 % 50 mL IVPB  Status:  Discontinued     1 g 100 mL/hr over 30 Minutes Intravenous Every 24 hours 05/20/13 2248 05/22/13 0929   05/20/13 1800  cefTRIAXone (ROCEPHIN) 1 g in dextrose 5 % 50 mL IVPB     1 g 100 mL/hr over 30 Minutes Intravenous  Once 05/20/13 1759 05/20/13 1850      Assessment/Plan: s/p Procedure(s): APPENDECTOMY (N/A) Advance diet as tolerated. May transferred to floor tomorrow if she remains stable from a cardiac standpoint. Continue with local wound care. Will likely need rehabilitation/skilled nursing facility at time of discharge  LOS: 8 days    Teegan Brandis C 05/28/2013

## 2013-05-29 NOTE — Progress Notes (Signed)
4 Days Post-Op  Subjective: Still not eating much.  No significant change.  No complaint of pain.  Objective: Vital signs in last 24 hours: Temp:  [97.7 F (36.5 C)-99 F (37.2 C)] 99 F (37.2 C) (06/08 1230) Pulse Rate:  [70-104] 79 (06/08 1525) Resp:  [10-18] 12 (06/08 1525) BP: (124-151)/(66-124) 138/86 mmHg (06/08 1400) SpO2:  [86 %-100 %] 97 % (06/08 1525) Weight:  [74.7 kg (164 lb 10.9 oz)] 74.7 kg (164 lb 10.9 oz) (06/08 0500) Last BM Date: 05/24/13  Intake/Output from previous day: 06/07 0701 - 06/08 0700 In: 2220 [P.O.:120; I.V.:2100] Out: 498 [Urine:498] Intake/Output this shift: Total I/O In: 200 [I.V.:200] Out: 775 [Urine:775]  General appearance: alert and no distress GI: +BS, soft, expected tenderness.  Incision is c/d/i.  + staples.    Lab Results:   Recent Labs  05/27/13 1356  WBC 6.1  HGB 10.0*  HCT 31.8*  PLT 182   BMET  Recent Labs  05/27/13 0433 05/27/13 1356  NA  --  138  K  --  4.4  CL  --  109  CO2  --  22  GLUCOSE  --  103*  BUN  --  7  CREATININE 0.65 0.71  CALCIUM  --  8.4   PT/INR No results found for this basename: LABPROT, INR,  in the last 72 hours ABG No results found for this basename: PHART, PCO2, PO2, HCO3,  in the last 72 hours  Studies/Results: No results found.  Anti-infectives: Anti-infectives   Start     Dose/Rate Route Frequency Ordered Stop   05/25/13 1800  cefTRIAXone (ROCEPHIN) 1 g in dextrose 5 % 50 mL IVPB     1 g 100 mL/hr over 30 Minutes Intravenous Every 24 hours 05/25/13 1707     05/22/13 1200  [MAR Hold]  piperacillin-tazobactam (ZOSYN) IVPB 3.375 g  Status:  Discontinued     (On MAR Hold since 05/25/13 1355)   3.375 g 12.5 mL/hr over 240 Minutes Intravenous Every 8 hours 05/22/13 0939 05/25/13 1648   05/22/13 1000  ciprofloxacin (CIPRO) IVPB 400 mg  Status:  Discontinued     400 mg 200 mL/hr over 60 Minutes Intravenous Every 12 hours 05/22/13 0929 05/25/13 1707   05/21/13 1700  cefTRIAXone  (ROCEPHIN) 1 g in dextrose 5 % 50 mL IVPB  Status:  Discontinued     1 g 100 mL/hr over 30 Minutes Intravenous Every 24 hours 05/20/13 2248 05/22/13 0929   05/20/13 1800  cefTRIAXone (ROCEPHIN) 1 g in dextrose 5 % 50 mL IVPB     1 g 100 mL/hr over 30 Minutes Intravenous  Once 05/20/13 1759 05/20/13 1850      Assessment/Plan: s/p Procedure(s): APPENDECTOMY (N/A) Continue to advance diet. Surgically, patient is progressing.  OK to d/c to rehab[facility]SNF [facility].    LOS: 9 days    Argus Caraher C 05/29/2013

## 2013-05-29 NOTE — Progress Notes (Signed)
Subjective: She says she feels better. She has no new complaints. She is not eating well but some of that seems to be related to not liking the food. She still complains of abdominal pain  Objective: Vital signs in last 24 hours: Temp:  [97.7 F (36.5 C)-98.2 F (36.8 C)] 97.7 F (36.5 C) (06/08 0400) Pulse Rate:  [66-86] 77 (06/08 0500) Resp:  [9-15] 14 (06/08 0500) BP: (132-163)/(66-105) 146/78 mmHg (06/08 0500) SpO2:  [86 %-100 %] 100 % (06/08 0500) Weight:  [74.7 kg (164 lb 10.9 oz)] 74.7 kg (164 lb 10.9 oz) (06/08 0500) Weight change: 0.5 kg (1 lb 1.6 oz) Last BM Date: 05/24/13  Intake/Output from previous day: 06/07 0701 - 06/08 0700 In: 2120 [P.O.:120; I.V.:2000] Out: 498 [Urine:498]  PHYSICAL EXAM General appearance: alert and mild distress Resp: clear to auscultation bilaterally Cardio: regular rate and rhythm, S1, S2 normal, no murmur, click, rub or gallop GI: Still mild diffuse tenderness Extremities: extremities normal, atraumatic, no cyanosis or edema  Lab Results:    Basic Metabolic Panel:  Recent Labs  40/98/11 0433 05/27/13 1356  NA  --  138  K  --  4.4  CL  --  109  CO2  --  22  GLUCOSE  --  103*  BUN  --  7  CREATININE 0.65 0.71  CALCIUM  --  8.4   Liver Function Tests: No results found for this basename: AST, ALT, ALKPHOS, BILITOT, PROT, ALBUMIN,  in the last 72 hours No results found for this basename: LIPASE, AMYLASE,  in the last 72 hours No results found for this basename: AMMONIA,  in the last 72 hours CBC:  Recent Labs  05/27/13 1356  WBC 6.1  NEUTROABS 3.3  HGB 10.0*  HCT 31.8*  MCV 92.2  PLT 182   Cardiac Enzymes:  Recent Labs  05/27/13 1356 05/27/13 1911 05/28/13 0127  TROPONINI <0.30 <0.30 <0.30   BNP: No results found for this basename: PROBNP,  in the last 72 hours D-Dimer: No results found for this basename: DDIMER,  in the last 72 hours CBG: No results found for this basename: GLUCAP,  in the last 72  hours Hemoglobin A1C: No results found for this basename: HGBA1C,  in the last 72 hours Fasting Lipid Panel: No results found for this basename: CHOL, HDL, LDLCALC, TRIG, CHOLHDL, LDLDIRECT,  in the last 72 hours Thyroid Function Tests: No results found for this basename: TSH, T4TOTAL, FREET4, T3FREE, THYROIDAB,  in the last 72 hours Anemia Panel: No results found for this basename: VITAMINB12, FOLATE, FERRITIN, TIBC, IRON, RETICCTPCT,  in the last 72 hours Coagulation: No results found for this basename: LABPROT, INR,  in the last 72 hours Urine Drug Screen: Drugs of Abuse  No results found for this basename: labopia, cocainscrnur, labbenz, amphetmu, thcu, labbarb    Alcohol Level: No results found for this basename: ETH,  in the last 72 hours Urinalysis: No results found for this basename: COLORURINE, APPERANCEUR, LABSPEC, PHURINE, GLUCOSEU, HGBUR, BILIRUBINUR, KETONESUR, PROTEINUR, UROBILINOGEN, NITRITE, LEUKOCYTESUR,  in the last 72 hours Misc. Labs:  ABGS No results found for this basename: PHART, PCO2, PO2ART, TCO2, HCO3,  in the last 72 hours CULTURES Recent Results (from the past 240 hour(s))  URINE CULTURE     Status: None   Collection Time    05/20/13  2:57 PM      Result Value Range Status   Specimen Description URINE, CLEAN CATCH   Final   Special Requests NONE  Final   Culture  Setup Time 05/21/2013 00:11   Final   Colony Count >=100,000 COLONIES/ML   Final   Culture ESCHERICHIA COLI   Final   Report Status 05/24/2013 FINAL   Final   Organism ID, Bacteria ESCHERICHIA COLI   Final  MRSA PCR SCREENING     Status: Abnormal   Collection Time    05/20/13  9:54 PM      Result Value Range Status   MRSA by PCR POSITIVE (*) NEGATIVE Final   Comment:            The GeneXpert MRSA Assay (FDA     approved for NASAL specimens     only), is one component of a     comprehensive MRSA colonization     surveillance program. It is not     intended to diagnose MRSA      infection nor to guide or     monitor treatment for     MRSA infections.     RESULT CALLED TO, READ BACK BY AND VERIFIED WITH:     NEILSON,T @ 0200 ON 05/21/13 BY WOODIE,J   Studies/Results: No results found.  Medications:  Prior to Admission:  Prescriptions prior to admission  Medication Sig Dispense Refill  . ALPRAZolam (XANAX) 0.5 MG tablet Take 0.5 mg by mouth every 8 (eight) hours as needed.      . Choline Fenofibrate (FENOFIBRIC ACID) 135 MG CPDR Take 1 tablet by mouth daily.      Marland Kitchen diltiazem (TIAZAC) 240 MG 24 hr capsule Take 240 mg by mouth daily.      . divalproex (DEPAKOTE SPRINKLE) 125 MG capsule Take 125-250 mg by mouth 2 (two) times daily. Take two capsules in the morning and take one capsule at bedtime      . furosemide (LASIX) 40 MG tablet Take 40 mg by mouth daily.      Marland Kitchen HYDROcodone-acetaminophen (NORCO) 10-325 MG per tablet Take 1 tablet by mouth 3 (three) times daily as needed.      . mirtazapine (REMERON) 15 MG tablet Take 15 mg by mouth at bedtime.      . nebivolol (BYSTOLIC) 5 MG tablet Take 5 mg by mouth daily.      Marland Kitchen omeprazole (PRILOSEC) 20 MG capsule Take 20 mg by mouth daily.      . rosuvastatin (CRESTOR) 10 MG tablet Take 10 mg by mouth at bedtime.      Marland Kitchen tiotropium (SPIRIVA) 18 MCG inhalation capsule Place 18 mcg into inhaler and inhale daily.      . traMADol (ULTRAM) 50 MG tablet Take 50 mg by mouth every morning.       Marland Kitchen acetaminophen (Q-PAP) 500 MG tablet Take 500 mg by mouth every 4 (four) hours as needed. For pain      . albuterol (PROVENTIL) (2.5 MG/3ML) 0.083% nebulizer solution Take 2.5 mg by nebulization every 6 (six) hours as needed. For shortness of breath       Scheduled: . acidophilus  2 capsule Oral Daily  . antiseptic oral rinse  15 mL Mouth Rinse BID  . cefTRIAXone (ROCEPHIN)  IV  1 g Intravenous Q24H  . diltiazem  240 mg Oral Daily  . enoxaparin (LOVENOX) injection  40 mg Subcutaneous Q24H  . famotidine  20 mg Oral Daily  . sodium  chloride  3 mL Intravenous Q12H   Continuous: . 0.9 % NaCl with KCl 20 mEq / L 100 mL/hr at 05/28/13 2051   WUJ:[id number],  albuterol, ALPRAZolam, HYDROcodone-acetaminophen, ipratropium, ondansetron (ZOFRAN) IV, traZODone  Assesment: She had appendicitis. This has been operated on. She still not eating well. She has COPD which is at least moderately severe. She is an abdominal aortic aneurysm that we'll need further evaluation by her vascular surgeon. She has renal insufficiency. She has had bradycardia probably related to medication/vagal effect. She is much improved Principal Problem:   Appendicitis, acute Active Problems:   COPD (chronic obstructive pulmonary disease)   AAA (abdominal aortic aneurysm)   Abdominal pain   Nausea and vomiting   UTI (urinary tract infection)   Renal insufficiency   Chronic kidney disease (CKD) stage G3a/A1, moderately decreased glomerular filtration rate (GFR) between 45-59 mL/min/1.73 square meter and albuminuria creatinine ratio less than 30 mg/g   Bradycardia    Plan: I will advance her diet and transfer her from the ICU    LOS: 9 days   Lorey Pallett L 05/29/2013, 7:37 AM

## 2013-05-30 ENCOUNTER — Inpatient Hospital Stay (HOSPITAL_COMMUNITY): Payer: Medicare Other

## 2013-05-30 ENCOUNTER — Encounter (HOSPITAL_COMMUNITY): Payer: Self-pay

## 2013-05-30 MED ORDER — ADULT MULTIVITAMIN W/MINERALS CH
1.0000 | ORAL_TABLET | Freq: Every day | ORAL | Status: DC
  Administered 2013-05-30 – 2013-05-31 (×2): 1 via ORAL
  Filled 2013-05-30 (×2): qty 1

## 2013-05-30 MED ORDER — TECHNETIUM TC 99M MEBROFENIN IV KIT
5.0000 | PACK | Freq: Once | INTRAVENOUS | Status: AC | PRN
  Administered 2013-05-30: 5 via INTRAVENOUS

## 2013-05-30 MED ORDER — SINCALIDE 5 MCG IJ SOLR
INTRAMUSCULAR | Status: AC
  Administered 2013-05-30: 1.41 ug via INTRAVENOUS
  Filled 2013-05-30: qty 5

## 2013-05-30 MED ORDER — POLYETHYLENE GLYCOL 3350 17 G PO PACK
17.0000 g | PACK | Freq: Every day | ORAL | Status: DC
  Administered 2013-05-30 – 2013-05-31 (×2): 17 g via ORAL
  Filled 2013-05-30 (×2): qty 1

## 2013-05-30 MED ORDER — PRO-STAT SUGAR FREE PO LIQD
30.0000 mL | Freq: Three times a day (TID) | ORAL | Status: DC
  Administered 2013-05-31: 30 mL via ORAL
  Filled 2013-05-30 (×3): qty 30

## 2013-05-30 MED ORDER — DRONABINOL 2.5 MG PO CAPS
2.5000 mg | ORAL_CAPSULE | Freq: Two times a day (BID) | ORAL | Status: DC
  Administered 2013-05-30 – 2013-05-31 (×3): 2.5 mg via ORAL
  Filled 2013-05-30 (×3): qty 1

## 2013-05-30 MED ORDER — BOOST / RESOURCE BREEZE PO LIQD: 1.0000 | Freq: Three times a day (TID) | ORAL | Status: DC

## 2013-05-30 NOTE — Progress Notes (Signed)
INITIAL NUTRITION ASSESSMENT  DOCUMENTATION CODES Per approved criteria  -Non-severe (moderate) malnutrition in the context of acute and chronic illness   INTERVENTION: Resource Breeze po TID, each supplement provides 250 kcal and 9 grams of protein. ProStat 30 ml TID (each 30 ml provides 100 kcal, 15 gr protein) MVI daily  NUTRITION DIAGNOSIS: Malnutrition related to inadequate energy intake as evidenced by unplanned wt loss of 30#,16% x 9 months .   Goal: Pt to meet >/= 90% of their estimated nutrition needs; not met  Monitor:  Po intake, labs and wt trends  Reason for Assessment: Malnutrition Screen Score =3  78 y.o. female  Admitting Dx: Appendicitis, acute  ASSESSMENT: Pt has experienced 30-40# wt loss over past 9 months-1 year. She is s/p appendectomy (5-days ago). C/o nausea and does not feel like eating. Her po intake has been poor 0-5% since admission 05/20/13. Marinol started this morning. Recommend consider scheduled Zofran instead of prn before meals to help improve po at mealtime. Will be d/c to SNF.   Height: Ht Readings from Last 1 Encounters:  05/25/13 5\' 6"  (1.676 m)    Weight: Wt Readings from Last 1 Encounters:  05/30/13 160 lb 4 oz (72.689 kg)    Ideal Body Weight: 130# (59 kg)  % Ideal Body Weight: 123%  Wt Readings from Last 10 Encounters:  05/30/13 160 lb 4 oz (72.689 kg)  05/30/13 160 lb 4 oz (72.689 kg)  02/09/13 160 lb (72.576 kg)  12/28/12 160 lb (72.576 kg)  09/02/12 189 lb 13.1 oz (86.1 kg)  08/20/12 196 lb 3.2 oz (88.996 kg)  08/04/12 183 lb 9.6 oz (83.28 kg)  05/24/12 199 lb 4.7 oz (90.4 kg)  12/10/11 192 lb (87.091 kg)  11/26/11 195 lb (88.451 kg)    Usual Body Weight: 190-195#  % Usual Body Weight: 84%  BMI:  Body mass index is 25.88 kg/(m^2).overweight  Estimated Nutritional Needs: Kcal: 1500-1700 Protein: 70-80 gr Fluid: >2000 ml/day  Skin: abdominal incision   Diet Order: Fiber Restricted  EDUCATION NEEDS: -No  education needs identified at this time   Intake/Output Summary (Last 24 hours) at 05/30/13 1028 Last data filed at 05/30/13 0834  Gross per 24 hour  Intake     40 ml  Output   1150 ml  Net  -1110 ml    Last BM: 05/24/13  Labs:   Recent Labs Lab 05/23/13 1137 05/27/13 0433 05/27/13 1356  NA 137  --  138  K 4.4  --  4.4  CL 104  --  109  CO2 25  --  22  BUN 13  --  7  CREATININE 1.12* 0.65 0.71  CALCIUM 8.8  --  8.4  GLUCOSE 117*  --  103*    CBG (last 3)  No results found for this basename: GLUCAP,  in the last 72 hours  Scheduled Meds: . acidophilus  2 capsule Oral Daily  . antiseptic oral rinse  15 mL Mouth Rinse BID  . cefTRIAXone (ROCEPHIN)  IV  1 g Intravenous Q24H  . diltiazem  240 mg Oral Daily  . dronabinol  2.5 mg Oral BID AC  . enoxaparin (LOVENOX) injection  40 mg Subcutaneous Q24H  . famotidine  20 mg Oral Daily  . polyethylene glycol  17 g Oral Daily  . sodium chloride  3 mL Intravenous Q12H    Continuous Infusions: . 0.9 % NaCl with KCl 20 mEq / L 100 mL/hr at 05/30/13 1610    Past  Medical History  Diagnosis Date  . Hypertension   . Hyperlipidemia   . COPD (chronic obstructive pulmonary disease)     multiple hospitalizations for exacerbations with acute bronchitis  . Degenerative joint disease     Bilateral hips  . Depression with anxiety   . GERD (gastroesophageal reflux disease)     Hiatal hernia  . Atrial arrhythmia     Atrial fibrillation and atrial flutter diagnosed in the past;?  PSVT  . AAA (abdominal aortic aneurysm)     infrarenal; 07/2012: Diameter of 5.4 cm  . Arteriosclerotic cardiovascular disease (ASCVD)     Cardiac cath in 03/2000->  80% LAD, 70-80% RCA;  stent x1 in the LAD and x2 in the RCA with excellent angiographic outcome; repeat catheterization in late 2001 and 2003 revealed no restenosis; EF-50%  . Tobacco abuse, in remission     Quit in 2006; total consumption of 50-75 pack years  . Pituitary microadenoma      transsphenoidal resection in 09/2007  . Anemia   . Coronary artery disease   . PVD (peripheral vascular disease)   . Gall stones   . Hypokalemia   . Ileus   . SBO (small bowel obstruction)   . Hypernatremia   . Nephrolithiasis   . Renal failure   . DJD (degenerative joint disease)   . Encephalopathy     Past Surgical History  Procedure Laterality Date  . Kidney stone surgery    . Transphenoidal / transnasal hypophysectomy / resection pituitary tumor  09/2007  . Cardiac catheterization  2001    post PTCA and stenting of her LAD and RCA  . Appendectomy N/A 05/25/2013    Procedure: APPENDECTOMY;  Surgeon: Fabio Bering, MD;  Location: [facility];  Service: General;  Laterality: N/A;    Royann Shivers MS,RD,LDN,CSG Office: [phone] Pager: [phone]

## 2013-05-30 NOTE — Progress Notes (Signed)
PT Cancellation Note  Patient Details Name: [name] [name] MRN: [id number] DOB: 1935-12-08   Cancelled Treatment:    Reason Eval/Treat Not Completed: Patient at procedure or test/unavailable   Myrlene Broker L 05/30/2013, 1:13 PM

## 2013-05-30 NOTE — Progress Notes (Signed)
Subjective: She says she feels some better. She only ate about 2 bites of breakfast and says it made her nauseated. She did have sludge in her gallbladder and we were planning biliary scan but then she was found to have appendicitis on CT.  Objective: Vital signs in last 24 hours: Temp:  [97.4 F (36.3 C)-99 F (37.2 C)] 97.9 F (36.6 C) (06/09 0700) Pulse Rate:  [74-104] 91 (06/09 0700) Resp:  [10-20] 20 (06/09 0700) BP: (124-149)/(67-124) 143/81 mmHg (06/09 0700) SpO2:  [90 %-98 %] 97 % (06/09 0700) Weight:  [72.689 kg (160 lb 4 oz)] 72.689 kg (160 lb 4 oz) (06/09 0500) Weight change: -2.011 kg (-4 lb 6.9 oz) Last BM Date: 05/24/13  Intake/Output from previous day: 06/08 0701 - 06/09 0700 In: 200 [I.V.:200] Out: 775 [Urine:775]  PHYSICAL EXAM General appearance: alert, cooperative and mild distress Resp: clear to auscultation bilaterally Cardio: regular rate and rhythm, S1, S2 normal, no murmur, click, rub or gallop GI: Mildly tender but much better Extremities: extremities normal, atraumatic, no cyanosis or edema  Lab Results:    Basic Metabolic Panel:  Recent Labs  96/04/54 1356  NA 138  K 4.4  CL 109  CO2 22  GLUCOSE 103*  BUN 7  CREATININE 0.71  CALCIUM 8.4   Liver Function Tests: No results found for this basename: AST, ALT, ALKPHOS, BILITOT, PROT, ALBUMIN,  in the last 72 hours No results found for this basename: LIPASE, AMYLASE,  in the last 72 hours No results found for this basename: AMMONIA,  in the last 72 hours CBC:  Recent Labs  05/27/13 1356  WBC 6.1  NEUTROABS 3.3  HGB 10.0*  HCT 31.8*  MCV 92.2  PLT 182   Cardiac Enzymes:  Recent Labs  05/27/13 1356 05/27/13 1911 05/28/13 0127  TROPONINI <0.30 <0.30 <0.30   BNP: No results found for this basename: PROBNP,  in the last 72 hours D-Dimer: No results found for this basename: DDIMER,  in the last 72 hours CBG: No results found for this basename: GLUCAP,  in the last 72  hours Hemoglobin A1C: No results found for this basename: HGBA1C,  in the last 72 hours Fasting Lipid Panel: No results found for this basename: CHOL, HDL, LDLCALC, TRIG, CHOLHDL, LDLDIRECT,  in the last 72 hours Thyroid Function Tests: No results found for this basename: TSH, T4TOTAL, FREET4, T3FREE, THYROIDAB,  in the last 72 hours Anemia Panel: No results found for this basename: VITAMINB12, FOLATE, FERRITIN, TIBC, IRON, RETICCTPCT,  in the last 72 hours Coagulation: No results found for this basename: LABPROT, INR,  in the last 72 hours Urine Drug Screen: Drugs of Abuse  No results found for this basename: labopia, cocainscrnur, labbenz, amphetmu, thcu, labbarb    Alcohol Level: No results found for this basename: ETH,  in the last 72 hours Urinalysis: No results found for this basename: COLORURINE, APPERANCEUR, LABSPEC, PHURINE, GLUCOSEU, HGBUR, BILIRUBINUR, KETONESUR, PROTEINUR, UROBILINOGEN, NITRITE, LEUKOCYTESUR,  in the last 72 hours Misc. Labs:  ABGS No results found for this basename: PHART, PCO2, PO2ART, TCO2, HCO3,  in the last 72 hours CULTURES Recent Results (from the past 240 hour(s))  URINE CULTURE     Status: None   Collection Time    05/20/13  2:57 PM      Result Value Range Status   Specimen Description URINE, CLEAN CATCH   Final   Special Requests NONE   Final   Culture  Setup Time 05/21/2013 00:11   Final  Colony Count >=100,000 COLONIES/ML   Final   Culture ESCHERICHIA COLI   Final   Report Status 05/24/2013 FINAL   Final   Organism ID, Bacteria ESCHERICHIA COLI   Final  MRSA PCR SCREENING     Status: Abnormal   Collection Time    05/20/13  9:54 PM      Result Value Range Status   MRSA by PCR POSITIVE (*) NEGATIVE Final   Comment:            The GeneXpert MRSA Assay (FDA     approved for NASAL specimens     only), is one component of a     comprehensive MRSA colonization     surveillance program. It is not     intended to diagnose MRSA      infection nor to guide or     monitor treatment for     MRSA infections.     RESULT CALLED TO, READ BACK BY AND VERIFIED WITH:     NEILSON,T @ 0200 ON 05/21/13 BY WOODIE,J   Studies/Results: No results found.  Medications:  Prior to Admission:  Prescriptions prior to admission  Medication Sig Dispense Refill  . ALPRAZolam (XANAX) 0.5 MG tablet Take 0.5 mg by mouth every 8 (eight) hours as needed.      . Choline Fenofibrate (FENOFIBRIC ACID) 135 MG CPDR Take 1 tablet by mouth daily.      Marland Kitchen diltiazem (TIAZAC) 240 MG 24 hr capsule Take 240 mg by mouth daily.      . divalproex (DEPAKOTE SPRINKLE) 125 MG capsule Take 125-250 mg by mouth 2 (two) times daily. Take two capsules in the morning and take one capsule at bedtime      . furosemide (LASIX) 40 MG tablet Take 40 mg by mouth daily.      Marland Kitchen HYDROcodone-acetaminophen (NORCO) 10-325 MG per tablet Take 1 tablet by mouth 3 (three) times daily as needed.      . mirtazapine (REMERON) 15 MG tablet Take 15 mg by mouth at bedtime.      . nebivolol (BYSTOLIC) 5 MG tablet Take 5 mg by mouth daily.      Marland Kitchen omeprazole (PRILOSEC) 20 MG capsule Take 20 mg by mouth daily.      . rosuvastatin (CRESTOR) 10 MG tablet Take 10 mg by mouth at bedtime.      Marland Kitchen tiotropium (SPIRIVA) 18 MCG inhalation capsule Place 18 mcg into inhaler and inhale daily.      . traMADol (ULTRAM) 50 MG tablet Take 50 mg by mouth every morning.       Marland Kitchen acetaminophen (Q-PAP) 500 MG tablet Take 500 mg by mouth every 4 (four) hours as needed. For pain      . albuterol (PROVENTIL) (2.5 MG/3ML) 0.083% nebulizer solution Take 2.5 mg by nebulization every 6 (six) hours as needed. For shortness of breath       Scheduled: . acidophilus  2 capsule Oral Daily  . antiseptic oral rinse  15 mL Mouth Rinse BID  . cefTRIAXone (ROCEPHIN)  IV  1 g Intravenous Q24H  . diltiazem  240 mg Oral Daily  . enoxaparin (LOVENOX) injection  40 mg Subcutaneous Q24H  . famotidine  20 mg Oral Daily  . polyethylene  glycol  17 g Oral Daily  . sodium chloride  3 mL Intravenous Q12H   Continuous: . 0.9 % NaCl with KCl 20 mEq / L 100 mL/hr at 05/30/13 0608   WUJ:[id number], albuterol, ALPRAZolam, HYDROcodone-acetaminophen, ipratropium, ondansetron (  ZOFRAN) IV, traZODone  Assesment: She was admitted with appendicitis. She has had surgery for that. She continues to have some nausea. She was found to have sludge in her gallbladder previously. She has multiple other medical problems including COPD and abdominal aortic aneurysm, urinary tract infection and renal insufficiency. She was bradycardic and that is much improved Principal Problem:   Appendicitis, acute Active Problems:   COPD (chronic obstructive pulmonary disease)   AAA (abdominal aortic aneurysm)   Abdominal pain   Nausea and vomiting   UTI (urinary tract infection)   Renal insufficiency   Chronic kidney disease (CKD) stage G3a/A1, moderately decreased glomerular filtration rate (GFR) between 45-59 mL/min/1.73 square meter and albuminuria creatinine ratio less than 30 mg/g   Bradycardia    Plan: She will have biliary scan. I discussed this with Dr. Lovell Sheehan attending surgeon. She will not necessarily need gallbladder surgery for biliary scan is positive but I think we need to know why she's having so much trouble getting back to eating    LOS: 10 days   [name] [name] L 05/30/2013, 8:49 AM

## 2013-05-30 NOTE — Progress Notes (Signed)
CCMD complaints of intermittent monitoring of patient. Leads checked, new stickers placed, battery is full, connection to tele box is secure. Pt. Asleep with no complaints.

## 2013-05-30 NOTE — Progress Notes (Signed)
5 Days Post-Op  Subjective: Complains of some incisional pain, but otherwise is starting to eat breakfast.  Objective: Vital signs in last 24 hours: Temp:  [97.4 F (36.3 C)-99 F (37.2 C)] 97.9 F (36.6 C) (06/09 0700) Pulse Rate:  [74-104] 91 (06/09 0700) Resp:  [10-20] 20 (06/09 0700) BP: (124-151)/(67-124) 143/81 mmHg (06/09 0700) SpO2:  [90 %-99 %] 97 % (06/09 0700) Weight:  [72.689 kg (160 lb 4 oz)] 72.689 kg (160 lb 4 oz) (06/09 0500) Last BM Date: 05/24/13  Intake/Output from previous day: 06/08 0701 - 06/09 0700 In: 200 [I.V.:200] Out: 775 [Urine:775] Intake/Output this shift:    General appearance: cooperative, appears stated age and no distress GI: Soft. Incision healing well. Occasional bowel sounds appreciated.  Lab Results:   Recent Labs  05/27/13 1356  WBC 6.1  HGB 10.0*  HCT 31.8*  PLT 182   BMET  Recent Labs  05/27/13 1356  NA 138  K 4.4  CL 109  CO2 22  GLUCOSE 103*  BUN 7  CREATININE 0.71  CALCIUM 8.4   PT/INR No results found for this basename: LABPROT, INR,  in the last 72 hours  Studies/Results: No results found.  Anti-infectives: Anti-infectives   Start     Dose/Rate Route Frequency Ordered Stop   05/25/13 1800  cefTRIAXone (ROCEPHIN) 1 g in dextrose 5 % 50 mL IVPB     1 g 100 mL/hr over 30 Minutes Intravenous Every 24 hours 05/25/13 1707     05/22/13 1200  [MAR Hold]  piperacillin-tazobactam (ZOSYN) IVPB 3.375 g  Status:  Discontinued     (On MAR Hold since 05/25/13 1355)   3.375 g 12.5 mL/hr over 240 Minutes Intravenous Every 8 hours 05/22/13 0939 05/25/13 1648   05/22/13 1000  ciprofloxacin (CIPRO) IVPB 400 mg  Status:  Discontinued     400 mg 200 mL/hr over 60 Minutes Intravenous Every 12 hours 05/22/13 0929 05/25/13 1707   05/21/13 1700  cefTRIAXone (ROCEPHIN) 1 g in dextrose 5 % 50 mL IVPB  Status:  Discontinued     1 g 100 mL/hr over 30 Minutes Intravenous Every 24 hours 05/20/13 2248 05/22/13 0929   05/20/13 1800   cefTRIAXone (ROCEPHIN) 1 g in dextrose 5 % 50 mL IVPB     1 g 100 mL/hr over 30 Minutes Intravenous  Once 05/20/13 1759 05/20/13 1850      Assessment/Plan: s/p Procedure(s): APPENDECTOMY Impression: Postoperative day 5, status post open appendectomy. Multiple comorbidities. He is awaiting skilled nursing home placement. Staples can be removed 06/08/2013.  LOS: 10 days    [name] [name] A 05/30/2013

## 2013-05-31 MED ORDER — ONDANSETRON HCL 4 MG/2ML IJ SOLN: 4.0000 mg | INTRAMUSCULAR | Status: DC | PRN

## 2013-05-31 MED ORDER — HYDROCODONE-ACETAMINOPHEN 5-325 MG PO TABS: 1.0000 | ORAL_TABLET | ORAL | Status: DC | PRN

## 2013-05-31 MED ORDER — PRO-STAT SUGAR FREE PO LIQD: 30.0000 mL | Freq: Three times a day (TID) | ORAL | Status: DC

## 2013-05-31 MED ORDER — TRAZODONE HCL 50 MG PO TABS: 50.0000 mg | ORAL_TABLET | Freq: Every evening | ORAL | Status: AC | PRN

## 2013-05-31 MED ORDER — IPRATROPIUM BROMIDE 0.02 % IN SOLN: 0.5000 mg | RESPIRATORY_TRACT | Status: DC | PRN

## 2013-05-31 MED ORDER — ALPRAZOLAM 0.5 MG PO TABS: 0.5000 mg | ORAL_TABLET | ORAL | Status: DC | PRN

## 2013-05-31 MED ORDER — DRONABINOL 2.5 MG PO CAPS: 2.5000 mg | ORAL_CAPSULE | Freq: Two times a day (BID) | ORAL | Status: DC

## 2013-05-31 MED ORDER — BOOST / RESOURCE BREEZE PO LIQD: 1.0000 | Freq: Three times a day (TID) | ORAL | Status: DC

## 2013-05-31 MED ORDER — ONDANSETRON HCL 4 MG PO TABS: 4.0000 mg | ORAL_TABLET | Freq: Three times a day (TID) | ORAL | Status: DC | PRN

## 2013-05-31 MED ORDER — ALBUTEROL SULFATE (5 MG/ML) 0.5% IN NEBU: 2.5000 mg | INHALATION_SOLUTION | RESPIRATORY_TRACT | Status: DC | PRN

## 2013-05-31 MED ORDER — ACETAMINOPHEN 325 MG PO TABS: 650.0000 mg | ORAL_TABLET | ORAL | Status: DC | PRN

## 2013-05-31 MED ORDER — ADULT MULTIVITAMIN W/MINERALS CH: 1.0000 | ORAL_TABLET | Freq: Every day | ORAL | Status: DC

## 2013-05-31 MED ORDER — POLYETHYLENE GLYCOL 3350 17 G PO PACK: 17.0000 g | PACK | Freq: Every day | ORAL | Status: DC

## 2013-05-31 MED ORDER — RISAQUAD PO CAPS: 2.0000 | ORAL_CAPSULE | Freq: Every day | ORAL | Status: DC

## 2013-05-31 MED ORDER — IPRATROPIUM BROMIDE 0.02 % IN SOLN: 0.5000 mg | Freq: Four times a day (QID) | RESPIRATORY_TRACT | Status: DC | PRN

## 2013-05-31 NOTE — Progress Notes (Signed)
She feels much better. She's not as nauseated. She is eating better. She has less pain.  Exam shows that she is awake and alert temperature is 90.8 pulse 80 respirations 20 blood pressure 135 or 69 and oxygen saturation 96%. Her chest is clear. Her heart is regular. She has minimal if any abdominal tenderness now. Assessment is that she's much improved and plan is for her to be transferred back to assisted living facility

## 2013-05-31 NOTE — Clinical Social Work Note (Signed)
Pt d/c today to [facility]. CSW spoke with pt, pt's daughter, and Bard Herbert and all are agreeable. Facility will provide transportation and daughter plans to complete paperwork this afternoon. Scripts in packet. D/C summary and FL2 faxed. CM to arrange home health with [facility].   Derenda Fennel, [place] [phone]

## 2013-05-31 NOTE — Progress Notes (Signed)
Patient's IV infiltrated, three nurses tried to stick patient and failed. Doctor was notified and he stated to let Dr. Juanetta Gosling decide on whether to get patient a PICC or try again.

## 2013-05-31 NOTE — Clinical Social Work Placement (Signed)
Clinical Social Work Department CLINICAL SOCIAL WORK PLACEMENT NOTE 05/31/2013  Patient:  [name] [name], [name] [name]  Account Number:  [id number] Admit date:  05/20/2013  Clinical Social Worker:  Derenda Fennel, LCSW  Date/time:  05/23/2013 01:30 PM  Clinical Social Work is seeking post-discharge placement for this patient at the following level of care:   ASSISTED LIVING/REST HOME   (*CSW will update this form in Epic as items are completed)   05/23/2013  Patient/family provided with [facility] Department of Clinical Social Work's list of facilities offering this level of care within the geographic area requested by the patient (or if unable, by the patient's family).  05/23/2013  Patient/family informed of their freedom to choose among providers that offer the needed level of care, that participate in Medicare, Medicaid or managed care program needed by the patient, have an available bed and are willing to accept the patient.  05/23/2013  Patient/family informed of [facility]' ownership interest in [facility] as well as of the fact that they are under no obligation to receive care at this facility.  PASARR submitted to [employer] on  PASARR number received from [employer] on   FL2 transmitted to all facilities in geographic area requested by pt/family on  05/23/2013 FL2 transmitted to all facilities within larger geographic area on   Patient informed that his/her managed care company has contracts with or will negotiate with  certain facilities, including the following:     Patient/family informed of bed offers received:  05/26/2013 Patient chooses bed at OTHER Physician recommends and patient chooses bed at  [facility]  Patient to be transferred to OTHER on  05/31/2013 Patient to be transferred to facility by facility  The following physician request were entered in Epic:   Additional Comments: Pt has existing pasarr number. Chose bed at [facility].  Derenda Fennel,  [place] [phone]

## 2013-05-31 NOTE — Progress Notes (Signed)
Pt alert with confused conversations. Up with assist. Vss. Discharge instructions given and discussed with an employee of [facility] facility. Caregiver verbalized understanding of instructions. Pt left floor via wheelchair with nursing staff.

## 2013-05-31 NOTE — Discharge Summary (Signed)
Physician Discharge Summary  Patient ID: [name] [name] MRN: [id number] DOB/AGE: 09/09/1935 78 y.o. Primary Care Physician:Shariya Gaster L, MD Admit date: 05/20/2013 Discharge date: 05/31/2013    Discharge Diagnoses:   Principal Problem:   Appendicitis, acute Active Problems:   COPD (chronic obstructive pulmonary disease)   AAA (abdominal aortic aneurysm)   Abdominal pain   Nausea and vomiting   UTI (urinary tract infection)   Renal insufficiency   Chronic kidney disease (CKD) stage G3a/A1, moderately decreased glomerular filtration rate (GFR) between 45-59 mL/min/1.73 square meter and albuminuria creatinine ratio less than 30 mg/g   Bradycardia     Medication List    STOP taking these medications       albuterol (2.5 MG/3ML) 0.083% nebulizer solution  Commonly known as:  PROVENTIL     diltiazem 240 MG 24 hr capsule  Commonly known as:  TIAZAC     Fenofibric Acid 135 MG Cpdr     furosemide 40 MG tablet  Commonly known as:  LASIX     HYDROcodone-acetaminophen 10-325 MG per tablet  Commonly known as:  NORCO     mirtazapine 15 MG tablet  Commonly known as:  REMERON      TAKE these medications       acetaminophen 325 MG tablet  Commonly known as:  TYLENOL  Take 2 tablets (650 mg total) by mouth every 4 (four) hours as needed.     acidophilus Caps  Take 2 capsules by mouth daily.     albuterol (5 MG/ML) 0.5% nebulizer solution  Commonly known as:  PROVENTIL  Take 0.5 mLs (2.5 mg total) by nebulization every 4 (four) hours as needed for wheezing.     ALPRAZolam 0.5 MG tablet  Commonly known as:  XANAX  Take 1 tablet (0.5 mg total) by mouth every 4 (four) hours as needed for anxiety.     divalproex 125 MG capsule  Commonly known as:  DEPAKOTE SPRINKLE  Take 125-250 mg by mouth 2 (two) times daily. Take two capsules in the morning and take one capsule at bedtime     dronabinol 2.5 MG capsule  Commonly known as:  MARINOL  Take 1 capsule (2.5 mg total) by  mouth 2 (two) times daily before lunch and supper.     feeding supplement Liqd  Take 1 Container by mouth 3 (three) times daily between meals.     feeding supplement Liqd  Take 30 mLs by mouth 3 (three) times daily with meals.     HYDROcodone-acetaminophen 5-325 MG per tablet  Commonly known as:  NORCO/VICODIN  Take 1-2 tablets by mouth every 4 (four) hours as needed.     ipratropium 0.02 % nebulizer solution  Commonly known as:  ATROVENT  Take 2.5 mLs (0.5 mg total) by nebulization every 4 (four) hours as needed for wheezing.     multivitamin with minerals Tabs  Take 1 tablet by mouth daily.     nebivolol 5 MG tablet  Commonly known as:  BYSTOLIC  Take 5 mg by mouth daily.     omeprazole 20 MG capsule  Commonly known as:  PRILOSEC  Take 20 mg by mouth daily.     ondansetron 4 MG tablet  Commonly known as:  ZOFRAN  Take 1 tablet (4 mg total) by mouth every 8 (eight) hours as needed for nausea.     polyethylene glycol packet  Commonly known as:  MIRALAX / GLYCOLAX  Take 17 g by mouth daily.     rosuvastatin 10  MG tablet  Commonly known as:  CRESTOR  Take 10 mg by mouth at bedtime.     tiotropium 18 MCG inhalation capsule  Commonly known as:  SPIRIVA  Place 18 mcg into inhaler and inhale daily.     traMADol 50 MG tablet  Commonly known as:  ULTRAM  Take 50 mg by mouth every morning.     traZODone 50 MG tablet  Commonly known as:  DESYREL  Take 1 tablet (50 mg total) by mouth at bedtime as needed for sleep (insomnia).        Discharged Condition: Improved    Consults: General surgery  Significant Diagnostic Studies: Dg Abd 1 View  05/22/2013   *RADIOLOGY REPORT*  Clinical Data: Vomiting.  ABDOMEN - 1 VIEW  Comparison: CT 12/28/2012.  Plain film 05/20/2013  Findings: Extensive clothing artifact over the abdomen and pelvis. No gross free intraperitoneal air on the supine image.  No significant bowel distention.  Gas within normal caliber stomach and colon.   Distal stool and gas.  Extensive vascular calcifications.  Infrarenal abdominal aortic dilatation.  Far left abdomen excluded.  Severe bilateral hip osteoarthritis with a component of left-sided developmental dysplasia.  IMPRESSION: Although the exam is degraded by positioning and overlying artifact, no explanation for vomiting is seen.   Original Report Authenticated By: Jeronimo Greaves, M.D.   US Abdomen Complete  05/20/2013   *RADIOLOGY REPORT*  Clinical Data:  Abdominal pain.  Nausea and vomiting.  Current history of abdominal aortic aneurysm.  COMPLETE ABDOMINAL ULTRASOUND  Comparison:  Abdominal ultrasound 12/28/2012.  CT abdomen pelvis of that same date.  Findings:  Gallbladder:  Sludge ball in the gallbladder fundus measuring approximate 2.4 cm which did not move with patient motion.  No associated acoustic shadowing to suggest gallstones.  On the prior examination, the sludge was mobile.  Mild gallbladder wall thickening up to 4 mm.  Negative sonographic Murphy's sign according to the ultrasound technologist.  Common bile duct:  Normal in caliber with maximum diameter approximating 7-8  mm.  Liver:  No focal mass lesion seen.  Within normal limits in parenchymal echogenicity.  IVC:  Patent.  Pancreas:  Although the pancreas is difficult to visualize in its entirety, no focal pancreatic abnormality is identified.  Spleen:  Normal size and echotexture without focal parenchymal abnormality.  Right Kidney:  No hydronephrosis.  Relatively well-preserved cortex.  Mildly echogenic parenchyma.  Approximate 1.8 cm simple cyst in the upper pole and approximate 0.8 cm simple cyst in the lower pole, unchanged.  No significant focal parenchymal abnormality.  No visible shadowing calculi.  Approximately 10.7 cm in length.  Left Kidney:  No hydronephrosis.  Relatively well-preserved cortex. Mildly echogenic parenchyma.  No focal parenchymal abnormality.  No visible shadowing calculi.  Approximately 10.0 cm in length.   Abdominal aorta:  Infrarenal abdominal aortic aneurysm with a large amount of mural thrombus and plaque, maximum AP diameter approximating 5.7 cm in maximum transverse diameter approximating 6.8 cm not significantly changed since the prior examination by my measurements.  IMPRESSION:  1.  Non mobile sludge ball in the gallbladder fundus.  Mild gallbladder wall thickening up to 4 mm without sonographic Murphy's sign may indicate chronic cholecystitis.  2.  Infrarenal abdominal aortic aneurysm, maximum dimensions 5.7 x 6.8 cm, not significantly changed since the January, 2014 examination. 3.  No significant abnormalities otherwise.   Original Report Authenticated By: Hulan Saas, M.D.   Ct Abdomen Pelvis W Contrast  05/25/2013   *RADIOLOGY REPORT*  Clinical Data: Abdominal pain with vomiting since 05/20/2013. History of abdominal aortic aneurysm, coronary artery disease, peripheral vascular disease and small bowel obstruction.  CT ABDOMEN AND PELVIS WITH CONTRAST  Technique:  Multidetector CT imaging of the abdomen and pelvis was performed following the standard protocol during bolus administration of intravenous contrast.  Contrast: OMNIPAQUE IOHEXOL 300 MG/ML  SOLN  Comparison: 12/28/2012.  Findings: Findings of appendicitis with inflamed appendix with small amount of fluid along the appendiceal tip and infiltration of surrounding fat planes.  Complex atherosclerotic type changes of the abdominal aorta including prominent calcified and noncalcified plaque, several aneurysms with largest aneurysm L3-4 level having increased in size from the recent 01/07/014 examination now with maximal transverse dimension of 5.6 x 5.4 cm versus prior maximal transverse dimension of 5.1 x 5.2 cm.  No CT evidence of periaortic bleeding. Atherosclerotic type changes contribute to the branch vessel narrowing including iliac artery narrowing.  Coronary artery calcifications.  Heart size within normal limits.  Fatty liver  without focal lesion.  No calcified gallstone.  Right renal lesions suggestive of cysts although some too small to characterize.  Scarring left kidney with nonobstructing tiny renal calculi.  No worrisome splenic, pancreatic or adrenal lesion.  Severe bilateral hip destruction which may be related to degenerative changes, avascular necrosis with superimposed degenerative changes or underlying arthropathy.  Noncontrast filled views of the urinary bladder without gross abnormality.  Mild retroverted uterus.  No adnexal mass noted.  IMPRESSION: Findings of appendicitis with inflamed appendix with small amount of fluid along the appendiceal tip and infiltration of surrounding fat planes.  Abdominal aortic aneurysm L3-4 level has increased in size from the recent 01/07/014 examination now with maximal transverse dimension of 5.6 x 5.4 cm versus prior maximal transverse dimension of 5.1 x 5.2 cm.  Please see above for additional findings.  Critical Value/emergent results were called by telephone at the time of interpretation on 05/25/2013 at 12/05 p.m. to Dr. Juanetta Gosling, who verbally acknowledged these results.   Original Report Authenticated By: Lacy Duverney, M.D.   Nm Hepato W/eject Fract  05/30/2013   *RADIOLOGY REPORT*  Clinical Data:  Nausea, history hypertension, hyperlipidemia, GERD, coronary artery disease, cholelithiasis  NUCLEAR MEDICINE HEPATOBILIARY IMAGING WITH GALLBLADDER EF:  Technique: Sequential images of the abdomen were obtained for 60 minutes following intravenous administration of radiopharmaceutical.  Patient then received an infusion of 0.02 ugm/kg of CCK analog intravenously over 30 minutes, and imaging was continued for 30 minutes.  A time-activity curve was generated from tracer within the gallbladder following CCK administration, and the gallbladder ejection fraction was calculated.  Radiopharmaceutical:  5 mCi Tc-47m mebrofenin Pharmaceutical:  1.41 mcg CCK  Comparison: None  Findings: Prompt  tracer extraction from bloodstream, indicating normal hepatocellular function. Prompt excretion of tracer into biliary tree. Gallbladder visualized at 28 minutes. Small bowel tracer visualized at 65 minutes. No hepatic retention of tracer.  Subjectively normal emptying of tracer from gallbladder following CCK stimulation. Calculated gallbladder ejection fraction is 78%, normal. Patient experienced no symptoms following CCK administration.  IMPRESSION: Normal exam.  Normal values for gallbladder ejection fraction: > 30% for exams utilizing sincalide (CCK) > 33% for exams utilizing Ensure Plus stimulation   Original Report Authenticated By: Ulyses Southward, M.D.   Dg Abd Acute W/chest  05/20/2013   *RADIOLOGY REPORT*  Clinical Data: Abdominal pain.  Nausea and vomiting.  ACUTE ABDOMEN SERIES (ABDOMEN 2 VIEW & CHEST 1 VIEW)  Comparison: CT abdomen and pelvis 12/28/2012.  Two-view chest x-ray 12/27/2012.  Findings: Bowel gas pattern unremarkable without evidence of obstruction or significant ileus.  Moderate to large stool burden. Aorto-iliac atherosclerosis with lower abdominal aortic aneurysm as noted on prior CT measuring approximately 6.4 cm diameter uncorrected for radiographic magnification (5.2 cm on the prior CT).  Phleboliths in the left side of the pelvis.  No visible opaque urinary tract calculi.  Severe degenerative changes involving both hip joints with remodeling of the left hip joint.  Cardiac silhouette normal in size, unchanged.  Thoracic aorta atherosclerotic, unchanged.  Hilar and mediastinal contours otherwise unremarkable.  Stable minimal scarring at the left lung base.  Lungs otherwise clear.  Cervicothoracic scoliosis convex left and thoracic scoliosis convex right.  IMPRESSION:  1.  No acute abdominal abnormality. 2.  Minimal scarring at the left lung base.  No acute cardiopulmonary disease.   Original Report Authenticated By: Hulan Saas, M.D.    Lab Results: Basic Metabolic Panel: No  results found for this basename: NA, K, CL, CO2, GLUCOSE, BUN, CREATININE, CALCIUM, MG, PHOS,  in the last 72 hours Liver Function Tests: No results found for this basename: AST, ALT, ALKPHOS, BILITOT, PROT, ALBUMIN,  in the last 72 hours   CBC: No results found for this basename: WBC, NEUTROABS, HGB, HCT, MCV, PLT,  in the last 72 hours  No results found for this or any previous visit (from the past 240 hour(s)).   Hospital Course: She was admitted with abdominal pain. He was confusing about exactly what caused this because she showed sludge in her gallbladder evidence of a urinary tract infection but had right lower quadrant pain. She was improving with antibiotics then developed increasing pain and had CT of the abdomen which showed acute appendicitis. She had surgery for this and was improving when she developed bradycardia probably related to medications. She probably also had some element of vagal response. Medications that can cause bradycardia were discontinued and she slowly improved. Her appetite improved and she was ready for discharge by the 10th and she's going to be discharged to a assisted living facility  Discharge Exam: Blood pressure 135/69, pulse 80, temperature 98 F (36.7 C), temperature source Oral, resp. rate 20, height 5\' 6"  (1.676 m), weight 69.9 kg (154 lb 1.6 oz), SpO2 96.00%. She is awake and alert. Her abdominal pain is much better. Her chest is clear. Heart rate is approximately 80  Disposition: She will be discharged to an assisted living facility. She will see her surgeon for staple removal and then be seen in my office in about 2 weeks      Discharge Orders   Future Appointments Provider Department Dept Phone   08/10/2013 9:30 AM [facility] [phone]   Eat a light meal the night before the exam but please avoid gaseous foods.   Nothing to eat or drink for at least 8 hours prior to the exam. No gum chewing or  smoking the morning of the exam. Please take your morning medications with small sips of water, especially blood pressure medication. If you have several vascular lab exams and will see physician, please bring a snack with you.   08/10/2013 10:20 AM Evern Bio, NP [facility] [phone]   Future Orders Complete By Expires     Discharge patient  As directed        Follow-up Information   Follow up with Fabio Bering, MD. Schedule an appointment as soon as possible for a visit on 06/09/2013.  Contact information:   [address] [place] [address] [phone]       Signed: Fredirick Maudlin Pager [phone]  05/31/2013, 9:02 AM

## 2013-06-02 DIAGNOSIS — J449 Chronic obstructive pulmonary disease, unspecified: Secondary | ICD-10-CM | POA: Diagnosis not present

## 2013-06-02 DIAGNOSIS — Z87440 Personal history of urinary (tract) infections: Secondary | ICD-10-CM | POA: Diagnosis not present

## 2013-06-02 DIAGNOSIS — J4489 Other specified chronic obstructive pulmonary disease: Secondary | ICD-10-CM | POA: Diagnosis not present

## 2013-06-02 DIAGNOSIS — I2510 Atherosclerotic heart disease of native coronary artery without angina pectoris: Secondary | ICD-10-CM | POA: Diagnosis not present

## 2013-06-02 DIAGNOSIS — I4891 Unspecified atrial fibrillation: Secondary | ICD-10-CM | POA: Diagnosis not present

## 2013-06-02 DIAGNOSIS — F411 Generalized anxiety disorder: Secondary | ICD-10-CM | POA: Diagnosis not present

## 2013-06-02 DIAGNOSIS — M1610 Unilateral primary osteoarthritis, unspecified hip: Secondary | ICD-10-CM | POA: Diagnosis not present

## 2013-06-02 DIAGNOSIS — F329 Major depressive disorder, single episode, unspecified: Secondary | ICD-10-CM | POA: Diagnosis not present

## 2013-06-02 DIAGNOSIS — Z48815 Encounter for surgical aftercare following surgery on the digestive system: Secondary | ICD-10-CM | POA: Diagnosis not present

## 2013-06-07 DIAGNOSIS — J449 Chronic obstructive pulmonary disease, unspecified: Secondary | ICD-10-CM | POA: Diagnosis not present

## 2013-06-07 DIAGNOSIS — M1610 Unilateral primary osteoarthritis, unspecified hip: Secondary | ICD-10-CM | POA: Diagnosis not present

## 2013-06-07 DIAGNOSIS — F329 Major depressive disorder, single episode, unspecified: Secondary | ICD-10-CM | POA: Diagnosis not present

## 2013-06-07 DIAGNOSIS — I4891 Unspecified atrial fibrillation: Secondary | ICD-10-CM | POA: Diagnosis not present

## 2013-06-07 DIAGNOSIS — I2510 Atherosclerotic heart disease of native coronary artery without angina pectoris: Secondary | ICD-10-CM | POA: Diagnosis not present

## 2013-06-09 DIAGNOSIS — M1610 Unilateral primary osteoarthritis, unspecified hip: Secondary | ICD-10-CM | POA: Diagnosis not present

## 2013-06-09 DIAGNOSIS — I4891 Unspecified atrial fibrillation: Secondary | ICD-10-CM | POA: Diagnosis not present

## 2013-06-09 DIAGNOSIS — I2510 Atherosclerotic heart disease of native coronary artery without angina pectoris: Secondary | ICD-10-CM | POA: Diagnosis not present

## 2013-06-09 DIAGNOSIS — J449 Chronic obstructive pulmonary disease, unspecified: Secondary | ICD-10-CM | POA: Diagnosis not present

## 2013-06-09 DIAGNOSIS — F329 Major depressive disorder, single episode, unspecified: Secondary | ICD-10-CM | POA: Diagnosis not present

## 2013-06-13 DIAGNOSIS — F329 Major depressive disorder, single episode, unspecified: Secondary | ICD-10-CM | POA: Diagnosis not present

## 2013-06-13 DIAGNOSIS — M1610 Unilateral primary osteoarthritis, unspecified hip: Secondary | ICD-10-CM | POA: Diagnosis not present

## 2013-06-13 DIAGNOSIS — J449 Chronic obstructive pulmonary disease, unspecified: Secondary | ICD-10-CM | POA: Diagnosis not present

## 2013-06-13 DIAGNOSIS — I2510 Atherosclerotic heart disease of native coronary artery without angina pectoris: Secondary | ICD-10-CM | POA: Diagnosis not present

## 2013-06-13 DIAGNOSIS — I4891 Unspecified atrial fibrillation: Secondary | ICD-10-CM | POA: Diagnosis not present

## 2013-06-15 DIAGNOSIS — I2510 Atherosclerotic heart disease of native coronary artery without angina pectoris: Secondary | ICD-10-CM | POA: Diagnosis not present

## 2013-06-15 DIAGNOSIS — I4891 Unspecified atrial fibrillation: Secondary | ICD-10-CM | POA: Diagnosis not present

## 2013-06-15 DIAGNOSIS — M1610 Unilateral primary osteoarthritis, unspecified hip: Secondary | ICD-10-CM | POA: Diagnosis not present

## 2013-06-15 DIAGNOSIS — J449 Chronic obstructive pulmonary disease, unspecified: Secondary | ICD-10-CM | POA: Diagnosis not present

## 2013-06-15 DIAGNOSIS — F329 Major depressive disorder, single episode, unspecified: Secondary | ICD-10-CM | POA: Diagnosis not present

## 2013-06-17 DIAGNOSIS — F329 Major depressive disorder, single episode, unspecified: Secondary | ICD-10-CM | POA: Diagnosis not present

## 2013-06-17 DIAGNOSIS — J449 Chronic obstructive pulmonary disease, unspecified: Secondary | ICD-10-CM | POA: Diagnosis not present

## 2013-06-17 DIAGNOSIS — I4891 Unspecified atrial fibrillation: Secondary | ICD-10-CM | POA: Diagnosis not present

## 2013-06-17 DIAGNOSIS — M1610 Unilateral primary osteoarthritis, unspecified hip: Secondary | ICD-10-CM | POA: Diagnosis not present

## 2013-06-17 DIAGNOSIS — I2510 Atherosclerotic heart disease of native coronary artery without angina pectoris: Secondary | ICD-10-CM | POA: Diagnosis not present

## 2013-06-21 DIAGNOSIS — J449 Chronic obstructive pulmonary disease, unspecified: Secondary | ICD-10-CM | POA: Diagnosis not present

## 2013-06-21 DIAGNOSIS — F329 Major depressive disorder, single episode, unspecified: Secondary | ICD-10-CM | POA: Diagnosis not present

## 2013-06-21 DIAGNOSIS — I4891 Unspecified atrial fibrillation: Secondary | ICD-10-CM | POA: Diagnosis not present

## 2013-06-21 DIAGNOSIS — M1610 Unilateral primary osteoarthritis, unspecified hip: Secondary | ICD-10-CM | POA: Diagnosis not present

## 2013-06-21 DIAGNOSIS — I2510 Atherosclerotic heart disease of native coronary artery without angina pectoris: Secondary | ICD-10-CM | POA: Diagnosis not present

## 2013-06-22 DIAGNOSIS — F329 Major depressive disorder, single episode, unspecified: Secondary | ICD-10-CM | POA: Diagnosis not present

## 2013-06-22 DIAGNOSIS — I2510 Atherosclerotic heart disease of native coronary artery without angina pectoris: Secondary | ICD-10-CM | POA: Diagnosis not present

## 2013-06-22 DIAGNOSIS — M1610 Unilateral primary osteoarthritis, unspecified hip: Secondary | ICD-10-CM | POA: Diagnosis not present

## 2013-06-22 DIAGNOSIS — J449 Chronic obstructive pulmonary disease, unspecified: Secondary | ICD-10-CM | POA: Diagnosis not present

## 2013-06-22 DIAGNOSIS — I4891 Unspecified atrial fibrillation: Secondary | ICD-10-CM | POA: Diagnosis not present

## 2013-06-23 DIAGNOSIS — F329 Major depressive disorder, single episode, unspecified: Secondary | ICD-10-CM | POA: Diagnosis not present

## 2013-06-23 DIAGNOSIS — I2510 Atherosclerotic heart disease of native coronary artery without angina pectoris: Secondary | ICD-10-CM | POA: Diagnosis not present

## 2013-06-23 DIAGNOSIS — I4891 Unspecified atrial fibrillation: Secondary | ICD-10-CM | POA: Diagnosis not present

## 2013-06-23 DIAGNOSIS — J449 Chronic obstructive pulmonary disease, unspecified: Secondary | ICD-10-CM | POA: Diagnosis not present

## 2013-06-23 DIAGNOSIS — M1610 Unilateral primary osteoarthritis, unspecified hip: Secondary | ICD-10-CM | POA: Diagnosis not present

## 2013-06-24 DIAGNOSIS — J449 Chronic obstructive pulmonary disease, unspecified: Secondary | ICD-10-CM | POA: Diagnosis not present

## 2013-06-24 DIAGNOSIS — I2510 Atherosclerotic heart disease of native coronary artery without angina pectoris: Secondary | ICD-10-CM | POA: Diagnosis not present

## 2013-06-24 DIAGNOSIS — F329 Major depressive disorder, single episode, unspecified: Secondary | ICD-10-CM | POA: Diagnosis not present

## 2013-06-24 DIAGNOSIS — M1610 Unilateral primary osteoarthritis, unspecified hip: Secondary | ICD-10-CM | POA: Diagnosis not present

## 2013-06-24 DIAGNOSIS — I4891 Unspecified atrial fibrillation: Secondary | ICD-10-CM | POA: Diagnosis not present

## 2013-07-14 DIAGNOSIS — I2510 Atherosclerotic heart disease of native coronary artery without angina pectoris: Secondary | ICD-10-CM | POA: Diagnosis not present

## 2013-07-14 DIAGNOSIS — I4891 Unspecified atrial fibrillation: Secondary | ICD-10-CM | POA: Diagnosis not present

## 2013-07-14 DIAGNOSIS — F329 Major depressive disorder, single episode, unspecified: Secondary | ICD-10-CM | POA: Diagnosis not present

## 2013-07-14 DIAGNOSIS — J449 Chronic obstructive pulmonary disease, unspecified: Secondary | ICD-10-CM | POA: Diagnosis not present

## 2013-07-14 DIAGNOSIS — M1610 Unilateral primary osteoarthritis, unspecified hip: Secondary | ICD-10-CM | POA: Diagnosis not present

## 2013-08-02 DIAGNOSIS — R1084 Generalized abdominal pain: Secondary | ICD-10-CM | POA: Diagnosis not present

## 2013-08-02 DIAGNOSIS — J45901 Unspecified asthma with (acute) exacerbation: Secondary | ICD-10-CM | POA: Diagnosis not present

## 2013-08-02 DIAGNOSIS — Z111 Encounter for screening for respiratory tuberculosis: Secondary | ICD-10-CM | POA: Diagnosis not present

## 2013-08-02 DIAGNOSIS — N390 Urinary tract infection, site not specified: Secondary | ICD-10-CM | POA: Diagnosis not present

## 2013-08-02 DIAGNOSIS — J441 Chronic obstructive pulmonary disease with (acute) exacerbation: Secondary | ICD-10-CM | POA: Diagnosis not present

## 2013-08-02 DIAGNOSIS — M1990 Unspecified osteoarthritis, unspecified site: Secondary | ICD-10-CM | POA: Diagnosis not present

## 2013-08-09 ENCOUNTER — Encounter: Payer: Self-pay | Admitting: Family

## 2013-08-10 ENCOUNTER — Ambulatory Visit (INDEPENDENT_AMBULATORY_CARE_PROVIDER_SITE_OTHER): Payer: Medicare Other | Admitting: Family

## 2013-08-10 ENCOUNTER — Encounter (INDEPENDENT_AMBULATORY_CARE_PROVIDER_SITE_OTHER): Payer: Medicare Other | Admitting: *Deleted

## 2013-08-10 ENCOUNTER — Encounter: Payer: Self-pay | Admitting: Family

## 2013-08-10 VITALS — BP 168/88 | HR 55 | Resp 16 | Ht 65.0 in | Wt 146.0 lb

## 2013-08-10 DIAGNOSIS — R0789 Other chest pain: Secondary | ICD-10-CM | POA: Insufficient documentation

## 2013-08-10 DIAGNOSIS — I714 Abdominal aortic aneurysm, without rupture, unspecified: Secondary | ICD-10-CM

## 2013-08-10 DIAGNOSIS — R109 Unspecified abdominal pain: Secondary | ICD-10-CM | POA: Diagnosis not present

## 2013-08-10 NOTE — Progress Notes (Addendum)
[facility]   Established Abdominal Aortic Aneurysm  History of Present Illness  [name] [name] is a 78 y.o. (09/06/1935) female patient of Dr. Edilia Bo followed for known AAA. The patient denies any unusual abdominal or back pain. The patient has multiple comorbidities which makes elective repair extremely risky. Previous studies demonstrate an AAA, measuring 5.32 cm, today's maximum diameter measurement is 5.48 cm.  The patient does have low mid back for the last 2 years, not worsening, denies abdominal pain.  The patient is a former smoker, smoked since age 40, quit 5 years ago. Walking is limited by severe OA in left hip, does not seem to have claudication symptoms. Physical therapy helped hip pain, but after PT is over, pain returns. Lost 49 pounds in a year, states long standing poor appetite, she reports Dr. Juanetta Gosling aware.   Past Medical History  Diagnosis Date  . Hypertension   . Hyperlipidemia   . COPD (chronic obstructive pulmonary disease)     multiple hospitalizations for exacerbations with acute bronchitis  . Degenerative joint disease     Bilateral hips  . Depression with anxiety   . GERD (gastroesophageal reflux disease)     Hiatal hernia  . Atrial arrhythmia     Atrial fibrillation and atrial flutter diagnosed in the past;?  PSVT  . AAA (abdominal aortic aneurysm)     infrarenal; 07/2012: Diameter of 5.4 cm  . Arteriosclerotic cardiovascular disease (ASCVD)     Cardiac cath in 03/2000->  80% LAD, 70-80% RCA;  stent x1 in the LAD and x2 in the RCA with excellent angiographic outcome; repeat catheterization in late 2001 and 2003 revealed no restenosis; EF-50%  . Tobacco abuse, in remission     Quit in 2006; total consumption of 50-75 pack years  . Pituitary microadenoma     transsphenoidal resection in 09/2007  . Anemia   . Coronary artery disease   . PVD (peripheral vascular disease)   . Gall stones   . Hypokalemia   . Ileus   . SBO  (small bowel obstruction)   . Hypernatremia   . Nephrolithiasis   . Renal failure   . DJD (degenerative joint disease)   . Encephalopathy    Past Surgical History  Procedure Laterality Date  . Kidney stone surgery    . Transphenoidal / transnasal hypophysectomy / resection pituitary tumor  09/2007  . Cardiac catheterization  2001    post PTCA and stenting of her LAD and RCA  . Appendectomy N/A 05/25/2013    Procedure: APPENDECTOMY;  Surgeon: Fabio Bering, MD;  Location: [facility];  Service: General;  Laterality: N/A;   History   Social History  . Marital Status: Widowed    Spouse Name: N/A    Number of Children: N/A  . Years of Education: N/A   Occupational History  . Not on file.   Social History Main Topics  . Smoking status: Former Smoker -- 1.50 packs/day for 50 years    Types: Cigarettes    Quit date: 05/27/2005  . Smokeless tobacco: Former Neurosurgeon    Quit date: 01/11/2008  . Alcohol Use: No  . Drug Use: No  . Sexual Activity: Not Currently   Other Topics Concern  . Not on file   Social History Narrative  . No narrative on file   Family History  Problem Relation Age of Onset  . Heart disease Mother   . Hyperlipidemia Mother   . Hypertension Mother   .  Stroke Mother   . Cancer Brother     Current Outpatient Prescriptions on File Prior to Visit  Medication Sig Dispense Refill  . acetaminophen (TYLENOL) 325 MG tablet Take 2 tablets (650 mg total) by mouth every 4 (four) hours as needed.  60 tablet  12  . acidophilus (RISAQUAD) CAPS Take 2 capsules by mouth daily.  60 capsule  5  . albuterol (PROVENTIL) (5 MG/ML) 0.5% nebulizer solution Take 0.5 mLs (2.5 mg total) by nebulization every 4 (four) hours as needed for wheezing.  20 mL  12  . ALPRAZolam (XANAX) 0.5 MG tablet Take 1 tablet (0.5 mg total) by mouth every 4 (four) hours as needed for anxiety.  120 tablet  2  . divalproex (DEPAKOTE SPRINKLE) 125 MG capsule Take 125-250 mg by mouth 2 (two) times daily. Take  two capsules in the morning and take one capsule at bedtime      . dronabinol (MARINOL) 2.5 MG capsule Take 1 capsule (2.5 mg total) by mouth 2 (two) times daily before lunch and supper.  60 capsule  0  . HYDROcodone-acetaminophen (NORCO/VICODIN) 5-325 MG per tablet Take 1-2 tablets by mouth every 4 (four) hours as needed.  120 tablet  5  . nebivolol (BYSTOLIC) 5 MG tablet Take 5 mg by mouth daily.      Marland Kitchen omeprazole (PRILOSEC) 20 MG capsule Take 20 mg by mouth daily.      . polyethylene glycol (MIRALAX / GLYCOLAX) packet Take 17 g by mouth daily.  14 each  0  . rosuvastatin (CRESTOR) 10 MG tablet Take 10 mg by mouth at bedtime.      Marland Kitchen tiotropium (SPIRIVA) 18 MCG inhalation capsule Place 18 mcg into inhaler and inhale daily.      . traMADol (ULTRAM) 50 MG tablet Take 50 mg by mouth every morning.       . traZODone (DESYREL) 50 MG tablet Take 1 tablet (50 mg total) by mouth at bedtime as needed for sleep (insomnia).  30 tablet  12  . feeding supplement (PRO-STAT SUGAR FREE 64) LIQD Take 30 mLs by mouth 3 (three) times daily with meals.  900 mL  0  . feeding supplement (RESOURCE BREEZE) LIQD Take 1 Container by mouth 3 (three) times daily between meals.  90 Container  5  . ipratropium (ATROVENT) 0.02 % nebulizer solution Take 2.5 mLs (0.5 mg total) by nebulization every 4 (four) hours as needed for wheezing.  75 mL  12  . Multiple Vitamin (MULTIVITAMIN WITH MINERALS) TABS Take 1 tablet by mouth daily.  30 tablet  12  . ondansetron (ZOFRAN) 4 MG tablet Take 1 tablet (4 mg total) by mouth every 8 (eight) hours as needed for nausea.  20 tablet  0   No current facility-administered medications on file prior to visit.   Allergies  Allergen Reactions  . Zoloft [Sertraline Hcl] Anxiety  . Asa [Aspirin] Other (See Comments)    "my ears bleed"  . Lidocaine Other (See Comments)    unknown  . Nsaids Other (See Comments)    unknown  . Sulfonamide Derivatives Other (See Comments)    unknown    ROS:  [x]  Positive   [ ]  Negative   [ ]  All sytems reviewed and are negative  General: Arly.Keller ] Weight loss, [ ]  Fever, [ ]  chills Neurologic: [ ]  Dizziness, [ ]  Blackouts, [ ]  Seizure [ ]  Stroke, [ ]  "Mini stroke", [ ]  Slurred speech, [ ]  Temporary blindness; Arly.Keller ] weakness  in arms or legs, [ ]  Hoarseness Cardiac: [ ]  Chest pain/pressure, [ ]  Shortness of breath at rest Arly.Keller ] Shortness of breath with exertion, Arly.Keller ] Atrial fibrillation or irregular heartbeat at times Vascular: [ ]  Pain in legs with walking, [ ]  Pain in legs at rest, [ ]  Pain in legs at night,  [ ]  Non-healing ulcer, [ ]  Blood clot in vein/DVT,   Pulmonary: [ ]  Home oxygen, [ ]  Productive cough, [ ]  Coughing up blood, [ ]  Asthma,  [ ]  Wheezing Musculoskeletal:  Arly.Keller ] Arthritis, [x ] Low back pain, Arly.Keller ] Joint pain Hematologic: [ ]  Easy Bruising, [ ]  Anemia; [ ]  Hepatitis Gastrointestinal: [ ]  Blood in stool, [ ]  Gastroesophageal Reflux/heartburn, [ ]  Trouble swallowing Urinary: [ ]  chronic Kidney disease, [ ]  on HD - [ ]  MWF or [ ]  TTHS, [ ]  Burning with urination, [ ]  Difficulty urinating, [X} Recurrent UTI's Skin: [ ]  Rashes, [ ]  Wounds Psychological: [ ]  Anxiety, [ ]  Depression   Physical Examination  Filed Vitals:   08/10/13 1037  BP: 168/88  Pulse: 55  Resp: 16  Height: 5\' 5"  (1.651 m)  Weight: 146 lb (66.225 kg)  SpO2: 98%   Body mass index is 24.3 kg/(m^2).  General: A&O x 3, WD, hard of hearing  Pulmonary: Sym exp, no rales, rhonchi, + wheezing right lung base,   Cardiac: RRR, Nl S1, S2, no Murmurs, no atrial fibrillation at this time. Pitting pretibial edema: 1+ left, 2+ right.  Vascular: Vessel Right Left  Radial Palpable Palpable  Brachial Palpable Palpable  Carotid Palpable, without bruit Palpable, without bruit  Aorta  palpable N/A  Femoral Palpable Palpable  Popliteal Not palpable Not palpable  PT Not Palpable Not Palpable  DP Palpable Palpable   Gastrointestinal: soft, NTND, no pulsatile  mass.  Musculoskeletal: M/S 5/5 throughout  except lower extremities, Extremities without ischemic changes. Neurologic: CN 2-12 intact, Pain and light touch intact in extremities except , Motor exam as listed above  Non-Invasive Vascular Imaging  AAA Duplex (08/10/2013)  Previous size: 5.32 cm (Date: 02/09/13)  Current size:  5.48 cm (Date: 08/10/2013)  Medical Decision Making  The patient is a 78 y.o. female who presents with: asymptomatic AAA with insignificant increase in size. The patient has multiple comorbidities which makes elective repair extremely risky.  After discussing with Dr. Edilia Bo and based on this patient's exam and diagnostic studies,she needs repeat AAA Duplex in 6 months.  The threshold for repair is AAA size > 5.5 cm, growth > 1 cm/yr, and symptomatic status.  I emphasized the importance of maximal medical management including strict control of blood pressure, blood glucose, and lipid levels, antiplatelet agents, obtaining regular exercise, and continued cessation of smoking.    Thank you for allowing [place] to participate in this patient's care.  Charisse March, RN, MSN, FNP-C [facility] [place] Office: [phone]  Clinic Physician: [place]  08/10/2013, 10:57 AM

## 2013-08-19 ENCOUNTER — Other Ambulatory Visit: Payer: Self-pay | Admitting: *Deleted

## 2013-08-19 DIAGNOSIS — I714 Abdominal aortic aneurysm, without rupture: Secondary | ICD-10-CM

## 2013-10-27 ENCOUNTER — Other Ambulatory Visit: Payer: Self-pay

## 2013-11-03 DIAGNOSIS — Z23 Encounter for immunization: Secondary | ICD-10-CM | POA: Diagnosis not present

## 2013-11-03 DIAGNOSIS — M25559 Pain in unspecified hip: Secondary | ICD-10-CM | POA: Diagnosis not present

## 2013-11-15 ENCOUNTER — Ambulatory Visit: Payer: Medicare Other | Admitting: Cardiovascular Disease

## 2013-12-26 ENCOUNTER — Other Ambulatory Visit: Payer: Self-pay | Admitting: *Deleted

## 2014-02-02 ENCOUNTER — Observation Stay (HOSPITAL_COMMUNITY): Payer: Medicare Other

## 2014-02-02 ENCOUNTER — Inpatient Hospital Stay (HOSPITAL_COMMUNITY)
Admission: EM | Admit: 2014-02-02 | Discharge: 2014-02-04 | DRG: 641 | Disposition: A | Discharge status: Home Health | Payer: Medicare Other | Attending: Pulmonary Disease | Admitting: Pulmonary Disease

## 2014-02-02 ENCOUNTER — Encounter (HOSPITAL_COMMUNITY): Payer: Self-pay | Admitting: Emergency Medicine

## 2014-02-02 ENCOUNTER — Emergency Department (HOSPITAL_COMMUNITY): Payer: Medicare Other

## 2014-02-02 DIAGNOSIS — M545 Low back pain, unspecified: Secondary | ICD-10-CM | POA: Diagnosis present

## 2014-02-02 DIAGNOSIS — R4789 Other speech disturbances: Secondary | ICD-10-CM | POA: Diagnosis not present

## 2014-02-02 DIAGNOSIS — M1610 Unilateral primary osteoarthritis, unspecified hip: Secondary | ICD-10-CM | POA: Diagnosis present

## 2014-02-02 DIAGNOSIS — M25559 Pain in unspecified hip: Secondary | ICD-10-CM | POA: Diagnosis not present

## 2014-02-02 DIAGNOSIS — Z87442 Personal history of urinary calculi: Secondary | ICD-10-CM

## 2014-02-02 DIAGNOSIS — R269 Unspecified abnormalities of gait and mobility: Secondary | ICD-10-CM | POA: Diagnosis not present

## 2014-02-02 DIAGNOSIS — J4489 Other specified chronic obstructive pulmonary disease: Secondary | ICD-10-CM | POA: Diagnosis present

## 2014-02-02 DIAGNOSIS — R5381 Other malaise: Secondary | ICD-10-CM

## 2014-02-02 DIAGNOSIS — F341 Dysthymic disorder: Secondary | ICD-10-CM | POA: Diagnosis present

## 2014-02-02 DIAGNOSIS — E860 Dehydration: Principal | ICD-10-CM | POA: Diagnosis present

## 2014-02-02 DIAGNOSIS — M1990 Unspecified osteoarthritis, unspecified site: Secondary | ICD-10-CM | POA: Diagnosis present

## 2014-02-02 DIAGNOSIS — K219 Gastro-esophageal reflux disease without esophagitis: Secondary | ICD-10-CM | POA: Diagnosis present

## 2014-02-02 DIAGNOSIS — E785 Hyperlipidemia, unspecified: Secondary | ICD-10-CM | POA: Diagnosis present

## 2014-02-02 DIAGNOSIS — R410 Disorientation, unspecified: Secondary | ICD-10-CM | POA: Diagnosis present

## 2014-02-02 DIAGNOSIS — I714 Abdominal aortic aneurysm, without rupture, unspecified: Secondary | ICD-10-CM | POA: Diagnosis present

## 2014-02-02 DIAGNOSIS — Z8249 Family history of ischemic heart disease and other diseases of the circulatory system: Secondary | ICD-10-CM

## 2014-02-02 DIAGNOSIS — Z823 Family history of stroke: Secondary | ICD-10-CM

## 2014-02-02 DIAGNOSIS — J449 Chronic obstructive pulmonary disease, unspecified: Secondary | ICD-10-CM | POA: Diagnosis present

## 2014-02-02 DIAGNOSIS — R531 Weakness: Secondary | ICD-10-CM

## 2014-02-02 DIAGNOSIS — Z7401 Bed confinement status: Secondary | ICD-10-CM | POA: Diagnosis not present

## 2014-02-02 DIAGNOSIS — Z9861 Coronary angioplasty status: Secondary | ICD-10-CM | POA: Diagnosis not present

## 2014-02-02 DIAGNOSIS — I498 Other specified cardiac arrhythmias: Secondary | ICD-10-CM | POA: Diagnosis not present

## 2014-02-02 DIAGNOSIS — Z87891 Personal history of nicotine dependence: Secondary | ICD-10-CM | POA: Diagnosis not present

## 2014-02-02 DIAGNOSIS — R0989 Other specified symptoms and signs involving the circulatory and respiratory systems: Secondary | ICD-10-CM | POA: Diagnosis not present

## 2014-02-02 DIAGNOSIS — IMO0002 Reserved for concepts with insufficient information to code with codable children: Secondary | ICD-10-CM | POA: Diagnosis present

## 2014-02-02 DIAGNOSIS — I739 Peripheral vascular disease, unspecified: Secondary | ICD-10-CM | POA: Diagnosis present

## 2014-02-02 DIAGNOSIS — M87059 Idiopathic aseptic necrosis of unspecified femur: Secondary | ICD-10-CM | POA: Diagnosis present

## 2014-02-02 DIAGNOSIS — E869 Volume depletion, unspecified: Secondary | ICD-10-CM | POA: Diagnosis not present

## 2014-02-02 DIAGNOSIS — I4891 Unspecified atrial fibrillation: Secondary | ICD-10-CM | POA: Diagnosis present

## 2014-02-02 DIAGNOSIS — I10 Essential (primary) hypertension: Secondary | ICD-10-CM | POA: Diagnosis present

## 2014-02-02 DIAGNOSIS — R5383 Other fatigue: Secondary | ICD-10-CM

## 2014-02-02 DIAGNOSIS — R627 Adult failure to thrive: Secondary | ICD-10-CM | POA: Diagnosis present

## 2014-02-02 DIAGNOSIS — M2550 Pain in unspecified joint: Secondary | ICD-10-CM | POA: Diagnosis not present

## 2014-02-02 DIAGNOSIS — R6251 Failure to thrive (child): Secondary | ICD-10-CM | POA: Diagnosis not present

## 2014-02-02 DIAGNOSIS — I2510 Atherosclerotic heart disease of native coronary artery without angina pectoris: Secondary | ICD-10-CM | POA: Diagnosis present

## 2014-02-02 DIAGNOSIS — M129 Arthropathy, unspecified: Secondary | ICD-10-CM | POA: Diagnosis not present

## 2014-02-02 LAB — BASIC METABOLIC PANEL
BUN: 15 mg/dL (ref 6–23)
CHLORIDE: 104 meq/L (ref 96–112)
CO2: 25 meq/L (ref 19–32)
Calcium: 9.3 mg/dL (ref 8.4–10.5)
Creatinine, Ser: 0.86 mg/dL (ref 0.50–1.10)
GFR calc Af Amer: 74 mL/min — ABNORMAL LOW (ref >90)
GFR, EST NON AFRICAN AMERICAN: 64 mL/min — AB (ref >90)
Glucose, Bld: 87 mg/dL (ref 70–99)
POTASSIUM: 4.5 meq/L (ref 3.7–5.3)
SODIUM: 141 meq/L (ref 137–147)

## 2014-02-02 LAB — CBC WITH DIFFERENTIAL/PLATELET
Basophils Absolute: 0 10*3/uL (ref 0.0–0.1)
Basophils Relative: 0 % (ref 0–1)
EOS ABS: 0.1 10*3/uL (ref 0.0–0.7)
Eosinophils Relative: 1 % (ref 0–5)
HCT: 41.8 % (ref 36.0–46.0)
Hemoglobin: 13.7 g/dL (ref 12.0–15.0)
LYMPHS ABS: 2.5 10*3/uL (ref 0.7–4.0)
LYMPHS PCT: 36 % (ref 12–46)
MCH: 30.6 pg (ref 26.0–34.0)
MCHC: 32.8 g/dL (ref 30.0–36.0)
MCV: 93.5 fL (ref 78.0–100.0)
MONOS PCT: 9 % (ref 3–12)
Monocytes Absolute: 0.6 10*3/uL (ref 0.1–1.0)
NEUTROS PCT: 54 % (ref 43–77)
Neutro Abs: 3.7 10*3/uL (ref 1.7–7.7)
PLATELETS: 120 10*3/uL — AB (ref 150–400)
RBC: 4.47 MIL/uL (ref 3.87–5.11)
RDW: 14.4 % (ref 11.5–15.5)
WBC: 6.9 10*3/uL (ref 4.0–10.5)

## 2014-02-02 LAB — URINALYSIS, ROUTINE W REFLEX MICROSCOPIC
BILIRUBIN URINE: NEGATIVE
GLUCOSE, UA: NEGATIVE mg/dL
Hgb urine dipstick: NEGATIVE
Ketones, ur: NEGATIVE mg/dL
Leukocytes, UA: NEGATIVE
Nitrite: NEGATIVE
PH: 6 (ref 5.0–8.0)
Protein, ur: NEGATIVE mg/dL
Specific Gravity, Urine: 1.02 (ref 1.005–1.030)
UROBILINOGEN UA: 0.2 mg/dL (ref 0.0–1.0)

## 2014-02-02 LAB — MRSA PCR SCREENING: MRSA by PCR: NEGATIVE

## 2014-02-02 MED ORDER — ONDANSETRON HCL 4 MG/2ML IJ SOLN: 4.0000 mg | Freq: Four times a day (QID) | INTRAMUSCULAR | Status: DC | PRN

## 2014-02-02 MED ORDER — PANTOPRAZOLE SODIUM 40 MG PO TBEC
40.0000 mg | DELAYED_RELEASE_TABLET | Freq: Every day | ORAL | Status: DC
  Administered 2014-02-03 – 2014-02-04 (×2): 40 mg via ORAL
  Filled 2014-02-02 (×2): qty 1

## 2014-02-02 MED ORDER — ACETAMINOPHEN 325 MG PO TABS: 650.0000 mg | ORAL_TABLET | Freq: Four times a day (QID) | ORAL | Status: DC | PRN

## 2014-02-02 MED ORDER — DIVALPROEX SODIUM 125 MG PO CPSP
125.0000 mg | ORAL_CAPSULE | Freq: Every day | ORAL | Status: DC
  Administered 2014-02-02 – 2014-02-03 (×2): 125 mg via ORAL
  Filled 2014-02-02 (×4): qty 1

## 2014-02-02 MED ORDER — ALPRAZOLAM 0.5 MG PO TABS
0.5000 mg | ORAL_TABLET | ORAL | Status: DC | PRN
  Administered 2014-02-02 – 2014-02-04 (×2): 0.5 mg via ORAL
  Filled 2014-02-02 (×2): qty 1

## 2014-02-02 MED ORDER — SODIUM CHLORIDE 0.9 % IV SOLN
INTRAVENOUS | Status: DC
  Administered 2014-02-02 – 2014-02-03 (×3): via INTRAVENOUS

## 2014-02-02 MED ORDER — DIVALPROEX SODIUM 125 MG PO CPSP
250.0000 mg | ORAL_CAPSULE | Freq: Every day | ORAL | Status: DC
  Administered 2014-02-03 – 2014-02-04 (×2): 250 mg via ORAL
  Filled 2014-02-02 (×4): qty 2

## 2014-02-02 MED ORDER — ENOXAPARIN SODIUM 40 MG/0.4ML ~~LOC~~ SOLN
40.0000 mg | SUBCUTANEOUS | Status: DC
  Administered 2014-02-02 – 2014-02-03 (×2): 40 mg via SUBCUTANEOUS
  Filled 2014-02-02 (×2): qty 0.4

## 2014-02-02 MED ORDER — DRONABINOL 2.5 MG PO CAPS
2.5000 mg | ORAL_CAPSULE | Freq: Two times a day (BID) | ORAL | Status: DC
Start: 2014-02-03 — End: 2014-02-04
  Administered 2014-02-03 – 2014-02-04 (×3): 2.5 mg via ORAL
  Filled 2014-02-02 (×3): qty 1

## 2014-02-02 MED ORDER — TRAZODONE HCL 50 MG PO TABS: 50.0000 mg | ORAL_TABLET | Freq: Every evening | ORAL | Status: DC | PRN

## 2014-02-02 MED ORDER — ACETAMINOPHEN 650 MG RE SUPP: 650.0000 mg | Freq: Four times a day (QID) | RECTAL | Status: DC | PRN

## 2014-02-02 MED ORDER — AMLODIPINE BESYLATE 5 MG PO TABS
5.0000 mg | ORAL_TABLET | Freq: Every day | ORAL | Status: DC
  Administered 2014-02-02 – 2014-02-04 (×3): 5 mg via ORAL
  Filled 2014-02-02 (×3): qty 1

## 2014-02-02 MED ORDER — TIOTROPIUM BROMIDE MONOHYDRATE 18 MCG IN CAPS
ORAL_CAPSULE | RESPIRATORY_TRACT | Status: AC
  Filled 2014-02-02: qty 5

## 2014-02-02 MED ORDER — NEBIVOLOL HCL 2.5 MG PO TABS
5.0000 mg | ORAL_TABLET | Freq: Every day | ORAL | Status: DC
  Administered 2014-02-03 – 2014-02-04 (×2): 5 mg via ORAL
  Filled 2014-02-02 (×2): qty 2
  Filled 2014-02-02: qty 1
  Filled 2014-02-02: qty 2

## 2014-02-02 MED ORDER — POLYETHYLENE GLYCOL 3350 17 G PO PACK
17.0000 g | PACK | Freq: Every day | ORAL | Status: DC
  Administered 2014-02-03 – 2014-02-04 (×2): 17 g via ORAL
  Filled 2014-02-02 (×2): qty 1

## 2014-02-02 MED ORDER — HYDROCODONE-ACETAMINOPHEN 5-325 MG PO TABS
1.0000 | ORAL_TABLET | ORAL | Status: DC | PRN
  Administered 2014-02-03 – 2014-02-04 (×3): 1 via ORAL
  Filled 2014-02-02 (×3): qty 1

## 2014-02-02 MED ORDER — ATORVASTATIN CALCIUM 20 MG PO TABS
20.0000 mg | ORAL_TABLET | Freq: Every day | ORAL | Status: DC
Start: 2014-02-02 — End: 2014-02-04
  Administered 2014-02-02 – 2014-02-03 (×2): 20 mg via ORAL
  Filled 2014-02-02 (×2): qty 1

## 2014-02-02 MED ORDER — ONDANSETRON HCL 4 MG PO TABS: 4.0000 mg | ORAL_TABLET | Freq: Four times a day (QID) | ORAL | Status: DC | PRN

## 2014-02-02 MED ORDER — IPRATROPIUM BROMIDE 0.02 % IN SOLN: 0.5000 mg | RESPIRATORY_TRACT | Status: DC | PRN

## 2014-02-02 MED ORDER — TIOTROPIUM BROMIDE MONOHYDRATE 18 MCG IN CAPS
18.0000 ug | ORAL_CAPSULE | Freq: Every day | RESPIRATORY_TRACT | Status: DC
  Administered 2014-02-03 – 2014-02-04 (×2): 18 ug via RESPIRATORY_TRACT
  Filled 2014-02-02: qty 5

## 2014-02-02 NOTE — H&P (Signed)
[facility] History and Physical  [name] M Puder [id number] DOB: 03-21-1935 DOA: 02/02/2014  Referring physician: Dr. Lacinda Axon, ER physician PCP: Alonza Bogus, MD   Chief Complaint: Generalized weakness, inability to walk  HPI: [name] [name] is a 79 y.o. female who is a resident of a group home, is brought to the emergency room for evaluation today. Per reports, patient hasn't normally ambulatory with a walker. She reports worsening generalized weakness as well as pain in her joints including lower back, bilateral hips as well as bilateral knees. She is somewhat confused at times and therefore history may be unreliable. Staff from the group home that has accompanied her reports that over the past week they have noticed a worsening in her gait. She feels that she has an inability to bear her weight. They report that she often skips meals and her by mouth intake has been poor. There's been no significant shortness of breath or cough. No fevers. No dysuria. No nausea vomiting or diarrhea. There is no reported history of falls, syncope, chest pain or any other complaints. She was evaluated in the emergency room where initial blood work was relatively unremarkable. CT head was also noncontributory. Hospitalists were consulted regarding admission for observation and a possible workup.   Review of Systems:  Pertinent positives as per history of present illness, otherwise negative   Past Medical History  Diagnosis Date  . Hypertension   . Hyperlipidemia   . COPD (chronic obstructive pulmonary disease)     multiple hospitalizations for exacerbations with acute bronchitis  . Degenerative joint disease     Bilateral hips  . Depression with anxiety   . GERD (gastroesophageal reflux disease)     Hiatal hernia  . Atrial arrhythmia     Atrial fibrillation and atrial flutter diagnosed in the past;?  PSVT  . AAA (abdominal aortic aneurysm)     infrarenal; 07/2012: Diameter of 5.4 cm  .  Arteriosclerotic cardiovascular disease (ASCVD)     Cardiac cath in 03/2000->  80% LAD, 70-80% RCA;  stent x1 in the LAD and x2 in the RCA with excellent angiographic outcome; repeat catheterization in late 2001 and 2003 revealed no restenosis; EF-50%  . Tobacco abuse, in remission     Quit in 2006; total consumption of 50-75 pack years  . Pituitary microadenoma     transsphenoidal resection in 09/2007  . Anemia   . Coronary artery disease   . PVD (peripheral vascular disease)   . Gall stones   . Hypokalemia   . Ileus   . SBO (small bowel obstruction)   . Hypernatremia   . Nephrolithiasis   . Renal failure   . DJD (degenerative joint disease)   . Encephalopathy    Past Surgical History  Procedure Laterality Date  . Kidney stone surgery    . Transphenoidal / transnasal hypophysectomy / resection pituitary tumor  09/2007  . Cardiac catheterization  2001    post PTCA and stenting of her LAD and RCA  . Appendectomy N/A 05/25/2013    Procedure: APPENDECTOMY;  Surgeon: Donato Heinz, MD;  Location: [facility];  Service: General;  Laterality: N/A;   Social History:  reports that she quit smoking about 8 years ago. Her smoking use included Cigarettes. She has a 75 pack-year smoking history. She quit smokeless tobacco use about 6 years ago. She reports that she does not drink alcohol or use illicit drugs.  Allergies  Allergen Reactions  . Zoloft [Sertraline Hcl] Anxiety  . Diona Fanti [  Aspirin] Other (See Comments)    "my ears bleed"  . Lidocaine Other (See Comments)    unknown  . Nsaids Other (See Comments)    unknown  . Sulfonamide Derivatives Other (See Comments)    unknown    Family History  Problem Relation Age of Onset  . Heart disease Mother   . Hyperlipidemia Mother   . Hypertension Mother   . Stroke Mother   . Cancer Brother      Prior to Admission medications   Medication Sig Start Date End Date Taking? Authorizing Provider  acetaminophen (TYLENOL) 325 MG tablet Take 2  tablets (650 mg total) by mouth every 4 (four) hours as needed. 05/31/13  Yes Alonza Bogus, MD  acidophilus (RISAQUAD) CAPS Take 2 capsules by mouth daily. 05/31/13  Yes Alonza Bogus, MD  albuterol (PROVENTIL) (5 MG/ML) 0.5% nebulizer solution Take 0.5 mLs (2.5 mg total) by nebulization every 4 (four) hours as needed for wheezing. 05/31/13  Yes Alonza Bogus, MD  ALPRAZolam Duanne Moron) 0.5 MG tablet Take 1 tablet (0.5 mg total) by mouth every 4 (four) hours as needed for anxiety. 05/31/13  Yes Alonza Bogus, MD  divalproex (DEPAKOTE SPRINKLE) 125 MG capsule Take 125-250 mg by mouth 2 (two) times daily. Take two capsules in the morning and take one capsule at bedtime   Yes Historical Provider, MD  dronabinol (MARINOL) 2.5 MG capsule Take 1 capsule (2.5 mg total) by mouth 2 (two) times daily before lunch and supper. 05/31/13  Yes Alonza Bogus, MD  HYDROcodone-acetaminophen (NORCO/VICODIN) 5-325 MG per tablet Take 1-2 tablets by mouth every 4 (four) hours as needed. 05/31/13  Yes Alonza Bogus, MD  ipratropium (ATROVENT) 0.02 % nebulizer solution Take 2.5 mLs (0.5 mg total) by nebulization every 4 (four) hours as needed for wheezing. 05/31/13  Yes Alonza Bogus, MD  Multiple Vitamins-Minerals (THEREMS-M) TABS Take 1 tablet by mouth daily.   Yes Historical Provider, MD  nebivolol (BYSTOLIC) 5 MG tablet Take 5 mg by mouth daily.   Yes Historical Provider, MD  omeprazole (PRILOSEC) 20 MG capsule Take 20 mg by mouth daily.   Yes Historical Provider, MD  ondansetron (ZOFRAN) 4 MG tablet Take 1 tablet (4 mg total) by mouth every 8 (eight) hours as needed for nausea. 05/31/13  Yes Alonza Bogus, MD  polyethylene glycol [facility] / GLYCOLAX) packet Take 17 g by mouth daily. 05/31/13  Yes Alonza Bogus, MD  rosuvastatin (CRESTOR) 10 MG tablet Take 10 mg by mouth at bedtime.   Yes Historical Provider, MD  tiotropium (SPIRIVA) 18 MCG inhalation capsule Place 18 mcg into inhaler and inhale daily.    Yes Historical Provider, MD  traMADol (ULTRAM) 50 MG tablet Take 50 mg by mouth every morning.    Yes Historical Provider, MD  traZODone (DESYREL) 50 MG tablet Take 1 tablet (50 mg total) by mouth at bedtime as needed for sleep (insomnia). 05/31/13  Yes Alonza Bogus, MD   Physical Exam: Filed Vitals:   02/02/14 1819  BP: 177/75  Pulse: 53  Temp: 97.6 F (36.4 C)  Resp: 20    BP 177/75  Pulse 53  Temp(Src) 97.6 F (36.4 C) (Oral)  Resp 20  Ht 5\' 5"  (1.651 m)  Wt 69.6 kg (153 lb 7 oz)  BMI 25.53 kg/m2  SpO2 98%  General:  Appears calm and comfortable, can provide history but occasionally is confused Eyes: PERRL, normal lids, irises & conjunctiva ENT: Mucous membranes are  somewhat dry Neck: no LAD, masses or thyromegaly Cardiovascular: RRR, no m/r/g. No LE edema. Telemetry: SR, no arrhythmias  Respiratory: CTA bilaterally, no w/r/r. Normal respiratory effort. Abdomen: soft, ntnd, positive bowel sounds Skin: no rash or induration seen on limited exam Musculoskeletal: grossly normal tone BUE/BLE, tenderness to palpation in the hips bilaterally, right greater than left Psychiatric: grossly normal mood and affect, speech fluent and appropriate Neurologic: Decreased strength in the legs bilaterally 3/5, strength equal in the upper extremities bilaterally 5 out of 5           Labs on Admission:  Basic Metabolic Panel:  Recent Labs Lab 02/02/14 1258  NA 141  K 4.5  CL 104  CO2 25  GLUCOSE 87  BUN 15  CREATININE 0.86  CALCIUM 9.3   Liver Function Tests: No results found for this basename: AST, ALT, ALKPHOS, BILITOT, PROT, ALBUMIN,  in the last 168 hours No results found for this basename: LIPASE, AMYLASE,  in the last 168 hours No results found for this basename: AMMONIA,  in the last 168 hours CBC:  Recent Labs Lab 02/02/14 1258  WBC 6.9  NEUTROABS 3.7  HGB 13.7  HCT 41.8  MCV 93.5  PLT 120*   Cardiac Enzymes: No results found for this basename:  CKTOTAL, CKMB, CKMBINDEX, TROPONINI,  in the last 168 hours  BNP (last 3 results) No results found for this basename: PROBNP,  in the last 8760 hours CBG: No results found for this basename: GLUCAP,  in the last 168 hours  Radiological Exams on Admission: Ct Head Wo Contrast  02/02/2014   CLINICAL DATA:  Slurred speech and difficulty with walking  EXAM: CT HEAD WITHOUT CONTRAST  TECHNIQUE: Contiguous axial images were obtained from the base of the skull through the vertex without intravenous contrast. Study was obtained within 24 hr of patient's arrival at the emergency department.  COMPARISON:  August 15, 2012  FINDINGS: There is mild diffuse atrophy, stable. There is no appreciable mass, hemorrhage, extra-axial fluid collection, or midline shift. There is small vessel disease throughout the centra semiovale bilaterally, stable. There are small lacunar infarcts in the left lentiform nucleus, stable. There is no new gray-white compartment lesion. No acute infarct apparent.  The bony calvarium appears intact. The mastoid air cells are clear. There is debris in both external auditory canals.  IMPRESSION: Atrophy with supratentorial small vessel disease, stable. No intracranial mass, hemorrhage, or acute appearing infarct. Probable cerumen in each external 3auditory canal.   Electronically Signed   By: Lowella Grip M.D.   On: 02/02/2014 13:01    EKG: Independently reviewed. Sinus brady  Assessment/Plan Active Problems:   Hypertension   COPD (chronic obstructive pulmonary disease)   Dehydration   Failure to thrive   Generalized weakness   Gait difficulty   Confusion   Low back pain   1. Generalized weakness. May be due to deconditioning/mild dehydration. We'll obtain x-rays of the pelvis to rule out any underlying fractures. We'll also check TSH. We'll provide gentle hydration. Urinalysis is unremarkable. We'll check chest x-ray for completeness. 2. Gait difficulty. Likely related to  weakness from deconditioning. We'll request a physical therapy consult. If weakness continues to progress, may need further imaging including MRI of the lumbar spine. We will order x-rays of the lumbar spine for now. 3. Dehydration. Mild. We'll provide gentle hydration. 4. Low back pain. Appears to be somewhat of a chronic finding. We'll check x-rays of the spine. 5. Confusion. Unclear as to the  patient's baseline mental status. She may have some underlying dementia. We'll defer further workup to the primary care physician. 6. COPD. Appears to be stable. Patient breathing comfortably in room air with no added lung sounds. 7.  Hypertension. We'll add Norvasc to nebivolol  Code Status: presumed full code Family Communication: discussed with patient and caregiver at bedside Disposition Plan: Patient will be due to the hospital for observation. If workup is unremarkable, then she may possibly be discharged tomorrow. We'll request physical therapy evaluation to determine appropriate discharge disposition.  Time spent: 38mins  Sable Knoles [facility] Pager [phone]

## 2014-02-02 NOTE — ED Provider Notes (Signed)
CSN: [id number]     Arrival date & time 02/02/14  1113 History   First MD Initiated Contact with Patient 02/02/14 1325     Chief Complaint  Patient presents with  . Gait Problem     (Consider location/radiation/quality/duration/timing/severity/associated sxs/prior Treatment) HPI.... level V caveat for failure to thrive.   Patient lives in group home. She is normally ambulatory. According to caregiver, she has not been out of bed for several days. No specific complaints of fever, chills, dysuria, chest pain, dyspnea, neurological deficits. Severity is moderate. Nothing makes symptoms better or worse.  Past Medical History  Diagnosis Date  . Hypertension   . Hyperlipidemia   . COPD (chronic obstructive pulmonary disease)     multiple hospitalizations for exacerbations with acute bronchitis  . Degenerative joint disease     Bilateral hips  . Depression with anxiety   . GERD (gastroesophageal reflux disease)     Hiatal hernia  . Atrial arrhythmia     Atrial fibrillation and atrial flutter diagnosed in the past;?  PSVT  . AAA (abdominal aortic aneurysm)     infrarenal; 07/2012: Diameter of 5.4 cm  . Arteriosclerotic cardiovascular disease (ASCVD)     Cardiac cath in 03/2000->  80% LAD, 70-80% RCA;  stent x1 in the LAD and x2 in the RCA with excellent angiographic outcome; repeat catheterization in late 2001 and 2003 revealed no restenosis; EF-50%  . Tobacco abuse, in remission     Quit in 2006; total consumption of 50-75 pack years  . Pituitary microadenoma     transsphenoidal resection in 09/2007  . Anemia   . Coronary artery disease   . PVD (peripheral vascular disease)   . Gall stones   . Hypokalemia   . Ileus   . SBO (small bowel obstruction)   . Hypernatremia   . Nephrolithiasis   . Renal failure   . DJD (degenerative joint disease)   . Encephalopathy    Past Surgical History  Procedure Laterality Date  . Kidney stone surgery    . Transphenoidal / transnasal  hypophysectomy / resection pituitary tumor  09/2007  . Cardiac catheterization  2001    post PTCA and stenting of her LAD and RCA  . Appendectomy N/A 05/25/2013    Procedure: APPENDECTOMY;  Surgeon: Donato Heinz, MD;  Location: [facility];  Service: General;  Laterality: N/A;   Family History  Problem Relation Age of Onset  . Heart disease Mother   . Hyperlipidemia Mother   . Hypertension Mother   . Stroke Mother   . Cancer Brother    History  Substance Use Topics  . Smoking status: Former Smoker -- 1.50 packs/day for 50 years    Types: Cigarettes    Quit date: 05/27/2005  . Smokeless tobacco: Former Systems developer    Quit date: 01/11/2008  . Alcohol Use: No   OB History   Grav Para Term Preterm Abortions TAB SAB Ect Mult Living                 Review of Systems  Unable to perform ROS: Other      Allergies  Zoloft; Asa; Lidocaine; Nsaids; and Sulfonamide derivatives  Home Medications   Current Outpatient Rx  Name  Route  Sig  Dispense  Refill  . acetaminophen (TYLENOL) 325 MG tablet   Oral   Take 2 tablets (650 mg total) by mouth every 4 (four) hours as needed.   60 tablet   12   . acidophilus (RISAQUAD)  CAPS   Oral   Take 2 capsules by mouth daily.   60 capsule   5   . albuterol (PROVENTIL) (5 MG/ML) 0.5% nebulizer solution   Nebulization   Take 0.5 mLs (2.5 mg total) by nebulization every 4 (four) hours as needed for wheezing.   20 mL   12   . ALPRAZolam (XANAX) 0.5 MG tablet   Oral   Take 1 tablet (0.5 mg total) by mouth every 4 (four) hours as needed for anxiety.   120 tablet   2   . divalproex (DEPAKOTE SPRINKLE) 125 MG capsule   Oral   Take 125-250 mg by mouth 2 (two) times daily. Take two capsules in the morning and take one capsule at bedtime         . dronabinol (MARINOL) 2.5 MG capsule   Oral   Take 1 capsule (2.5 mg total) by mouth 2 (two) times daily before lunch and supper.   60 capsule   0   . HYDROcodone-acetaminophen (NORCO/VICODIN)  5-325 MG per tablet   Oral   Take 1-2 tablets by mouth every 4 (four) hours as needed.   120 tablet   5   . ipratropium (ATROVENT) 0.02 % nebulizer solution   Nebulization   Take 2.5 mLs (0.5 mg total) by nebulization every 4 (four) hours as needed for wheezing.   75 mL   12   . Multiple Vitamins-Minerals (THEREMS-M) TABS   Oral   Take 1 tablet by mouth daily.         . nebivolol (BYSTOLIC) 5 MG tablet   Oral   Take 5 mg by mouth daily.         Marland Kitchen omeprazole (PRILOSEC) 20 MG capsule   Oral   Take 20 mg by mouth daily.         . ondansetron (ZOFRAN) 4 MG tablet   Oral   Take 1 tablet (4 mg total) by mouth every 8 (eight) hours as needed for nausea.   20 tablet   0   . polyethylene glycol (MIRALAX / GLYCOLAX) packet   Oral   Take 17 g by mouth daily.   14 each   0   . rosuvastatin (CRESTOR) 10 MG tablet   Oral   Take 10 mg by mouth at bedtime.         Marland Kitchen tiotropium (SPIRIVA) 18 MCG inhalation capsule   Inhalation   Place 18 mcg into inhaler and inhale daily.         . traMADol (ULTRAM) 50 MG tablet   Oral   Take 50 mg by mouth every morning.          . traZODone (DESYREL) 50 MG tablet   Oral   Take 1 tablet (50 mg total) by mouth at bedtime as needed for sleep (insomnia).   30 tablet   12    BP 139/62  Pulse 50  Temp(Src) 97.4 F (36.3 C) (Oral)  Resp 16  SpO2 100% Physical Exam  Nursing note and vitals reviewed. Constitutional:  Looks older than stated age, unable to stand.  HENT:  Head: Normocephalic and atraumatic.  Eyes: Conjunctivae and EOM are normal. Pupils are equal, round, and reactive to light.  Neck: Normal range of motion. Neck supple.  Cardiovascular: Normal rate, regular rhythm and normal heart sounds.   Pulmonary/Chest: Effort normal and breath sounds normal.  Abdominal: Soft. Bowel sounds are normal.  Musculoskeletal: Normal range of motion.  Neurological: She is alert.  Skin: Skin is warm and dry.  Psychiatric:  Flat  affect    ED Course  Procedures (including critical care time) Labs Review Labs Reviewed  CBC WITH DIFFERENTIAL - Abnormal; Notable for the following:    Platelets 120 (*)    All other components within normal limits  BASIC METABOLIC PANEL - Abnormal; Notable for the following:    GFR calc non Af Amer 64 (*)    GFR calc Af Amer 74 (*)    All other components within normal limits  URINALYSIS, ROUTINE W REFLEX MICROSCOPIC   Imaging Review Ct Head Wo Contrast  02/02/2014   CLINICAL DATA:  Slurred speech and difficulty with walking  EXAM: CT HEAD WITHOUT CONTRAST  TECHNIQUE: Contiguous axial images were obtained from the base of the skull through the vertex without intravenous contrast. Study was obtained within 24 hr of patient's arrival at the emergency department.  COMPARISON:  August 15, 2012  FINDINGS: There is mild diffuse atrophy, stable. There is no appreciable mass, hemorrhage, extra-axial fluid collection, or midline shift. There is small vessel disease throughout the centra semiovale bilaterally, stable. There are small lacunar infarcts in the left lentiform nucleus, stable. There is no new gray-white compartment lesion. No acute infarct apparent.  The bony calvarium appears intact. The mastoid air cells are clear. There is debris in both external auditory canals.  IMPRESSION: Atrophy with supratentorial small vessel disease, stable. No intracranial mass, hemorrhage, or acute appearing infarct. Probable cerumen in each external 3auditory canal.   Electronically Signed   By: Lowella Grip M.D.   On: 02/02/2014 13:01    EKG Interpretation    Date/Time:  Thursday February 02 2014 11:34:40 EST Ventricular Rate:  51 PR Interval:  164 QRS Duration: 128 QT Interval:  434 QTC Calculation: 400 R Axis:   69 Text Interpretation:  Sinus bradycardia Non-specific intra-ventricular conduction block Cannot rule out Septal infarct , age undetermined Abnormal ECG When compared with ECG of  28-May-2013 06:08, Non-specific intra-ventricular conduction block has replaced Right bundle branch block Minimal criteria for Septal infarct are now Present Confirmed by Pietra Zuluaga  MD, Ashely Joshua ([id number]) on 02/02/2014 2:11:27 PM            MDM   Final diagnoses:  Failure to thrive    Patient is normally ambulatory. She's been bedridden for several days.  No specific complaints. Screening labs, CT head, urinalysis all negative. Admit to observation    Nat Christen, MD 02/02/14 1725

## 2014-02-02 NOTE — ED Notes (Signed)
Pt member of [facility] and reports pt has had difficulty walking today and yesterday.  Also reports pt's speech is slurred more than usual.  Caregiver reports pt was last seen normal Monday.   Pt c/o back pain and feeling sore all over.  C/O generalized weakness.

## 2014-02-02 NOTE — Plan of Care (Signed)
Paged Dr. Luan Pulling to inform of pt admit and in room 315.  Waiting on return call.

## 2014-02-03 DIAGNOSIS — E869 Volume depletion, unspecified: Secondary | ICD-10-CM | POA: Diagnosis not present

## 2014-02-03 DIAGNOSIS — M129 Arthropathy, unspecified: Secondary | ICD-10-CM | POA: Diagnosis not present

## 2014-02-03 LAB — CBC
HEMATOCRIT: 39.9 % (ref 36.0–46.0)
Hemoglobin: 13 g/dL (ref 12.0–15.0)
MCH: 30.4 pg (ref 26.0–34.0)
MCHC: 32.6 g/dL (ref 30.0–36.0)
MCV: 93.2 fL (ref 78.0–100.0)
PLATELETS: 99 10*3/uL — AB (ref 150–400)
RBC: 4.28 MIL/uL (ref 3.87–5.11)
RDW: 14.2 % (ref 11.5–15.5)
WBC: 5.7 10*3/uL (ref 4.0–10.5)

## 2014-02-03 LAB — BASIC METABOLIC PANEL
BUN: 13 mg/dL (ref 6–23)
CALCIUM: 8.8 mg/dL (ref 8.4–10.5)
CO2: 26 mEq/L (ref 19–32)
CREATININE: 0.82 mg/dL (ref 0.50–1.10)
Chloride: 105 mEq/L (ref 96–112)
GFR calc Af Amer: 79 mL/min — ABNORMAL LOW (ref >90)
GFR calc non Af Amer: 68 mL/min — ABNORMAL LOW (ref >90)
Glucose, Bld: 91 mg/dL (ref 70–99)
Potassium: 4 mEq/L (ref 3.7–5.3)
Sodium: 140 mEq/L (ref 137–147)

## 2014-02-03 LAB — TSH: TSH: 3.556 u[IU]/mL (ref 0.350–4.500)

## 2014-02-03 NOTE — Clinical Social Work Psychosocial (Signed)
Clinical Social Work Department BRIEF PSYCHOSOCIAL ASSESSMENT 02/03/2014  Patient:  [name] [name], [name] [name]     Account Number:  [id number]     [place] date:  02/02/2014  Clinical Social Worker:  [name] [name], BSW Intern  Date/Time:  02/03/2014 11:27 AM  Referred by:  Physician  Date Referred:  02/03/2014 Referred for  ALF Placement   Other Referral:   Interview type:  Other - See comment Other interview type:   [name] [name] Medical sales representative    PSYCHOSOCIAL DATA Living Status:  FACILITY Admitted from facility:  OTHER Level of care:  Group Home Primary support name:   Primary support relationship to patient:   Degree of support available:    CURRENT CONCERNS  Other Concerns:    SOCIAL WORK ASSESSMENT / PLAN Attempted to assess pt. Pt confused, not oriented. Called pt's daughter [name] [name] and left voicemail requesting return call.    Spoke with [name] [name], [facility] administrator who states pt has been there since late June of 2014. [name] [name] explained that the pt is able to walk short distances at the facility, and uses a wheelchair for longer distances. Informed him that pt was able to transfer while working with PT. PT recommended Home Health. Per [name] [name], pt has had this in the past and would like to set this up with [facility]. CM aware. Pt to possibly d/c over weekend, [name] [name] agreeable. CSW will continue to follow.   Assessment/plan status:  Psychosocial Support/Ongoing Assessment of Needs Other assessment/ plan:   Information/referral to community resources:    PATIENT'S/FAMILY'S RESPONSE TO PLAN OF CARE: [name] [name], facility administrator agreeable to weekend d/c if appropriate. Awaiting return call from pt's daughter, [name] [name]. CSW will continue to follow.      [name] [name] BSW Intern

## 2014-02-03 NOTE — Evaluation (Signed)
Physical Therapy Evaluation Patient Details Name: [name] [name] MRN: [id number] DOB: 1935/10/26 Today's Date: 02/03/2014 Time: [id number] PT Time Calculation (min): 55 min  PT Assessment / Plan / Recommendation History of Present Illness  Pt is admitted due to generalized pain and weakness.  She is a resident of a group home and per my note on evaluation last spring she was primarily w/c dependent...able to transfer independently from bed to chair.  Per Dr. Kathaleen Grinder note today, pt has bilateral avascular necrosis of both hips so this would confirm limited to no gait (at least not functional).  She is fairly well oriented today and states that she usually "paddles around" in her w/c.  Clinical Impression   Pt was seen for evaluation.  She is pleasant and cooperative, reports feeling better this AM.  She has no specific pain but does report generalized soreness.  She is able to transfer with SBA from bed to chair.  This is close to her prior functional level (as I understand it).  HHPT might be beneficial to ensure stability at group home.    PT Assessment  All further PT needs can be met in the next venue of care    Follow Up Recommendations  Home health PT    Does the patient have the potential to tolerate intense rehabilitation      Barriers to Discharge        Equipment Recommendations  None recommended by PT    Recommendations for Other Services     Frequency      Precautions / Restrictions Precautions Precautions: Fall Restrictions Weight Bearing Restrictions: No   Pertinent Vitals/Pain       Mobility  Bed Mobility Overal bed mobility: Needs Assistance Bed Mobility: Supine to Sit;Sit to Supine Supine to sit: Min assist Sit to supine: Min assist Transfers Overall transfer level: Needs assistance Equipment used: None Transfers: Sit to/from [facility] Sit to Stand: Supervision Stand pivot transfers: Min guard General transfer comment: min guard  for safety    Exercises     PT Diagnosis: Generalized weakness  PT Problem List: Decreased strength;Decreased mobility PT Treatment Interventions:       PT Goals(Current goals can be found in the care plan section) Acute Rehab PT Goals PT Goal Formulation: No goals set, d/c therapy  Visit Information  Last PT Received On: 02/03/14 History of Present Illness: Pt is admitted due to generalized pain and weakness.  She is a resident of a group home and per my note on evaluation last spring she was primarily w/c dependent...able to transfer independently from bed to chair.  Per Dr. Kathaleen Grinder note today, pt has bilateral avascular necrosis of both hips so this would confirm limited to no gait (at least not functional).  She is fairly well oriented today and states that she usually "paddles around" in her w/c.       Prior [place] expects to be discharged to:: Group home Prior Function Level of Independence: Needs assistance Gait / Transfers Assistance Needed: able to transfer independently but not functionally ambulatory ADL's / Homemaking Assistance Needed: assist needed with bathing and dressing per pt report Communication Communication: No difficulties    Cognition  Cognition Arousal/Alertness: Awake/alert Behavior During Therapy: WFL for tasks assessed/performed Overall Cognitive Status: Within Functional Limits for tasks assessed    Extremity/Trunk Assessment Lower Extremity Assessment Lower Extremity Assessment: RLE deficits/detail;LLE deficits/detail RLE Deficits / Details: strength generally 3-/5 LLE Deficits / Details: there is visible atrophy  of the left calf with apparent shortening of the LLE...she has only 3-/5 strength at best in this LE   Balance Balance Overall balance assessment: History of Falls  End of Session PT - End of Session Equipment Utilized During Treatment: Gait belt Activity Tolerance: Patient tolerated treatment  well Patient left: in bed;with call bell/phone within reach;with bed alarm set Nurse Communication: Mobility status  GP Functional Assessment Tool Used: clinical judgement Functional Limitation: Mobility: Walking and moving around Mobility: Walking and Moving Around Current Status ([id number]): At least 1 percent but less than 20 percent impaired, limited or restricted Mobility: Walking and Moving Around Goal Status [phone]): At least 1 percent but less than 20 percent impaired, limited or restricted Mobility: Walking and Moving Around Discharge Status [phone]): At least 1 percent but less than 20 percent impaired, limited or restricted   Sable Feil 02/03/2014, 9:45 AM

## 2014-02-03 NOTE — Clinical Social Work Note (Signed)
Attempted to call pt's daughter again at both numbers. No answer and voicemail left earlier requesting return call. Assume plan will be to return to [facility].   Benay Pike, [place]

## 2014-02-03 NOTE — Progress Notes (Signed)
UR completed. Patient changed to inpatient- requiring IVF@ 100cc/hr  

## 2014-02-03 NOTE — Progress Notes (Signed)
Subjective: She was admitted yesterday with weakness and inability to walk. She has baseline severe weakness. She has bilateral aseptic necrosis of her hips. She has not been felt to be an operative candidate for that because of her medical problems. She was felt to be somewhat dehydrated.  Objective: Vital signs in last 24 hours: Temp:  [97.3 F (36.3 C)-97.6 F (36.4 C)] 97.3 F (36.3 C) (02/13 0654) Pulse Rate:  [50-55] 51 (02/13 0654) Resp:  [14-20] 14 (02/13 0654) BP: (130-177)/(62-112) 130/72 mmHg (02/13 0654) SpO2:  [98 %-100 %] 100 % (02/13 0654) Weight:  [69.6 kg (153 lb 7 oz)] 69.6 kg (153 lb 7 oz) (02/12 1819) Weight change:     Intake/Output from previous day:    PHYSICAL EXAM General appearance: alert, cooperative and moderate distress Resp: clear to auscultation bilaterally Cardio: regular rate and rhythm, S1, S2 normal, no murmur, click, rub or gallop GI: soft, non-tender; bowel sounds normal; no masses,  no organomegaly Extremities: extremities normal, atraumatic, no cyanosis or edema  Lab Results:    Basic Metabolic Panel:  Recent Labs  02/02/14 1258 02/03/14 0512  NA 141 140  K 4.5 4.0  CL 104 105  CO2 25 26  GLUCOSE 87 91  BUN 15 13  CREATININE 0.86 0.82  CALCIUM 9.3 8.8   Liver Function Tests: No results found for this basename: AST, ALT, ALKPHOS, BILITOT, PROT, ALBUMIN,  in the last 72 hours No results found for this basename: LIPASE, AMYLASE,  in the last 72 hours No results found for this basename: AMMONIA,  in the last 72 hours CBC:  Recent Labs  02/02/14 1258 02/03/14 0512  WBC 6.9 5.7  NEUTROABS 3.7  --   HGB 13.7 13.0  HCT 41.8 39.9  MCV 93.5 93.2  PLT 120* 99*   Cardiac Enzymes: No results found for this basename: CKTOTAL, CKMB, CKMBINDEX, TROPONINI,  in the last 72 hours BNP: No results found for this basename: PROBNP,  in the last 72 hours D-Dimer: No results found for this basename: DDIMER,  in the last 72  hours CBG: No results found for this basename: GLUCAP,  in the last 72 hours Hemoglobin A1C: No results found for this basename: HGBA1C,  in the last 72 hours Fasting Lipid Panel: No results found for this basename: CHOL, HDL, LDLCALC, TRIG, CHOLHDL, LDLDIRECT,  in the last 72 hours Thyroid Function Tests:  Recent Labs  02/02/14 1258  TSH 3.556   Anemia Panel: No results found for this basename: VITAMINB12, FOLATE, FERRITIN, TIBC, IRON, RETICCTPCT,  in the last 72 hours Coagulation: No results found for this basename: LABPROT, INR,  in the last 72 hours Urine Drug Screen: Drugs of Abuse  No results found for this basename: labopia, cocainscrnur, labbenz, amphetmu, thcu, labbarb    Alcohol Level: No results found for this basename: ETH,  in the last 72 hours Urinalysis:  Recent Labs  02/02/14 1512  COLORURINE YELLOW  LABSPEC 1.020  PHURINE 6.0  GLUCOSEU NEGATIVE  HGBUR NEGATIVE  BILIRUBINUR NEGATIVE  KETONESUR NEGATIVE  PROTEINUR NEGATIVE  UROBILINOGEN 0.2  NITRITE NEGATIVE  LEUKOCYTESUR NEGATIVE   Misc. Labs:  ABGS No results found for this basename: PHART, PCO2, PO2ART, TCO2, HCO3,  in the last 72 hours CULTURES Recent Results (from the past 240 hour(s))  MRSA PCR SCREENING     Status: None   Collection Time    02/02/14  7:00 PM      Result Value Ref Range Status   MRSA by  PCR NEGATIVE  NEGATIVE Final   Comment:            The GeneXpert MRSA Assay (FDA     approved for NASAL specimens     only), is one component of a     comprehensive MRSA colonization     surveillance program. It is not     intended to diagnose MRSA     infection nor to guide or     monitor treatment for     MRSA infections.   Studies/Results: Dg Lumbar Spine 2-3 Views  02/03/2014   CLINICAL DATA:  Low back pain, no injury  EXAM: LUMBAR SPINE - 2-3 VIEW  COMPARISON:  None.  FINDINGS: There is no evidence of lumbar spine fracture or dislocation. There is scoliosis of spine. There are  facet joint degenerative change at L5-S1.  IMPRESSION: No acute fracture or dislocation.   Electronically Signed   By: Abelardo Diesel M.D.   On: 02/03/2014 02:22   Ct Head Wo Contrast  02/02/2014   CLINICAL DATA:  Slurred speech and difficulty with walking  EXAM: CT HEAD WITHOUT CONTRAST  TECHNIQUE: Contiguous axial images were obtained from the base of the skull through the vertex without intravenous contrast. Study was obtained within 24 hr of patient's arrival at the emergency department.  COMPARISON:  August 15, 2012  FINDINGS: There is mild diffuse atrophy, stable. There is no appreciable mass, hemorrhage, extra-axial fluid collection, or midline shift. There is small vessel disease throughout the centra semiovale bilaterally, stable. There are small lacunar infarcts in the left lentiform nucleus, stable. There is no new gray-white compartment lesion. No acute infarct apparent.  The bony calvarium appears intact. The mastoid air cells are clear. There is debris in both external auditory canals.  IMPRESSION: Atrophy with supratentorial small vessel disease, stable. No intracranial mass, hemorrhage, or acute appearing infarct. Probable cerumen in each external 3auditory canal.   Electronically Signed   By: Lowella Grip M.D.   On: 02/02/2014 13:01   Dg Pelvis Portable  02/03/2014   CLINICAL DATA:  Bilateral hip pain.  EXAM: PORTABLE PELVIS 1-2 VIEWS  COMPARISON:  CT abdomen and pelvis May 25, 2013  FINDINGS: There are marked chronic deformity of bilateral proximal femur with collapse and flattening of bilateral femoral head unchanged compared to prior CT pelvis of May 25, 2013. There is no acute fracture.  IMPRESSION: There are marked chronic deformity of bilateral proximal femur unchanged compared to prior CT pelvis of May 25, 2013. There is no acute fracture.   Electronically Signed   By: Abelardo Diesel M.D.   On: 02/03/2014 02:20   Portable Chest 1 View  02/03/2014   CLINICAL DATA:  Congestion,  history of hypertension, Coronary artery disease.  EXAM: PORTABLE CHEST - 1 VIEW  COMPARISON:  May 20, 2013  FINDINGS: The heart size and mediastinal contours are stable. The aorta is tortuous. Both lungs are clear. The visualized skeletal structures are stable.  IMPRESSION: No active cardiopulmonary disease.   Electronically Signed   By: Abelardo Diesel M.D.   On: 02/03/2014 02:17    Medications:  Prior to Admission:  Prescriptions prior to admission  Medication Sig Dispense Refill  . acetaminophen (TYLENOL) 325 MG tablet Take 2 tablets (650 mg total) by mouth every 4 (four) hours as needed.  60 tablet  12  . acidophilus (RISAQUAD) CAPS Take 2 capsules by mouth daily.  60 capsule  5  . albuterol (PROVENTIL) (5 MG/ML) 0.5% nebulizer solution  Take 0.5 mLs (2.5 mg total) by nebulization every 4 (four) hours as needed for wheezing.  20 mL  12  . ALPRAZolam (XANAX) 0.5 MG tablet Take 1 tablet (0.5 mg total) by mouth every 4 (four) hours as needed for anxiety.  120 tablet  2  . divalproex (DEPAKOTE SPRINKLE) 125 MG capsule Take 125-250 mg by mouth 2 (two) times daily. Take two capsules in the morning and take one capsule at bedtime      . dronabinol (MARINOL) 2.5 MG capsule Take 1 capsule (2.5 mg total) by mouth 2 (two) times daily before lunch and supper.  60 capsule  0  . HYDROcodone-acetaminophen (NORCO/VICODIN) 5-325 MG per tablet Take 1-2 tablets by mouth every 4 (four) hours as needed.  120 tablet  5  . ipratropium (ATROVENT) 0.02 % nebulizer solution Take 2.5 mLs (0.5 mg total) by nebulization every 4 (four) hours as needed for wheezing.  75 mL  12  . Multiple Vitamins-Minerals (THEREMS-M) TABS Take 1 tablet by mouth daily.      . nebivolol (BYSTOLIC) 5 MG tablet Take 5 mg by mouth daily.      Marland Kitchen omeprazole (PRILOSEC) 20 MG capsule Take 20 mg by mouth daily.      . ondansetron (ZOFRAN) 4 MG tablet Take 1 tablet (4 mg total) by mouth every 8 (eight) hours as needed for nausea.  20 tablet  0  .  polyethylene glycol (MIRALAX / GLYCOLAX) packet Take 17 g by mouth daily.  14 each  0  . rosuvastatin (CRESTOR) 10 MG tablet Take 10 mg by mouth at bedtime.      Marland Kitchen tiotropium (SPIRIVA) 18 MCG inhalation capsule Place 18 mcg into inhaler and inhale daily.      . traMADol (ULTRAM) 50 MG tablet Take 50 mg by mouth every morning.       . traZODone (DESYREL) 50 MG tablet Take 1 tablet (50 mg total) by mouth at bedtime as needed for sleep (insomnia).  30 tablet  12   Scheduled: . amLODipine  5 mg Oral Daily  . atorvastatin  20 mg Oral q1800  . divalproex  125 mg Oral QHS  . divalproex  250 mg Oral Daily  . dronabinol  2.5 mg Oral BID AC  . enoxaparin (LOVENOX) injection  40 mg Subcutaneous Q24H  . nebivolol  5 mg Oral Daily  . pantoprazole  40 mg Oral Daily  . polyethylene glycol  17 g Oral Daily  . tiotropium  18 mcg Inhalation Daily   Continuous: . sodium chloride 100 mL/hr at 02/03/14 3500   XFG:[id number], acetaminophen, ALPRAZolam, HYDROcodone-acetaminophen, ipratropium, ondansetron (ZOFRAN) IV, ondansetron, traZODone  Assesment: She was admitted with dehydration generalized weakness difficulty walking. She has multiple medical problems including COPD coronary artery occlusive disease and abdominal aortic aneurysm and aseptic necrosis of both hips Active Problems:   Hypertension   COPD (chronic obstructive pulmonary disease)   Dehydration   Failure to thrive   Generalized weakness   Gait difficulty   Confusion   Low back pain    Plan: Continue IV fluids. She's eating. better today. Ask for PT consultation    LOS: 1 day   Jessejames Steelman L 02/03/2014, 8:47 AM

## 2014-02-04 DIAGNOSIS — M129 Arthropathy, unspecified: Secondary | ICD-10-CM | POA: Diagnosis not present

## 2014-02-04 DIAGNOSIS — Z7401 Bed confinement status: Secondary | ICD-10-CM | POA: Diagnosis not present

## 2014-02-04 DIAGNOSIS — M2550 Pain in unspecified joint: Secondary | ICD-10-CM | POA: Diagnosis not present

## 2014-02-04 DIAGNOSIS — E869 Volume depletion, unspecified: Secondary | ICD-10-CM | POA: Diagnosis not present

## 2014-02-04 NOTE — Progress Notes (Signed)
Patient to discharge back to [facility].  Lennette Bihari, at facility called and made aware as well as [place].  Home care orders faxed and received.  Attempted to call daughter, [name], but no answer.  Voicemail left.  IV removed - WNL.  Awaiting transport by EMS.  Stable to DC at this time.

## 2014-02-04 NOTE — Progress Notes (Signed)
Subjective: She feels okay. She has no new complaints.  Objective: Vital signs in last 24 hours: Temp:  [97.6 F (36.4 C)-97.8 F (36.6 C)] 97.7 F (36.5 C) (02/14 0609) Pulse Rate:  [52-65] 52 (02/14 0609) Resp:  [16-18] 16 (02/14 0609) BP: (131-168)/(71-82) 138/80 mmHg (02/14 0609) SpO2:  [94 %-98 %] 95 % (02/14 0737) Weight change:  Last BM Date: 02/03/14  Intake/Output from previous day: 02/13 0701 - 02/14 0700 In: 720 [P.O.:720] Out: -   PHYSICAL EXAM General appearance: alert, cooperative and no distress Resp: clear to auscultation bilaterally Cardio: regular rate and rhythm, S1, S2 normal, no murmur, click, rub or gallop GI: soft, non-tender; bowel sounds normal; no masses,  no organomegaly Extremities: extremities normal, atraumatic, no cyanosis or edema  Lab Results:    Basic Metabolic Panel:  Recent Labs  02/02/14 1258 02/03/14 0512  NA 141 140  K 4.5 4.0  CL 104 105  CO2 25 26  GLUCOSE 87 91  BUN 15 13  CREATININE 0.86 0.82  CALCIUM 9.3 8.8   Liver Function Tests: No results found for this basename: AST, ALT, ALKPHOS, BILITOT, PROT, ALBUMIN,  in the last 72 hours No results found for this basename: LIPASE, AMYLASE,  in the last 72 hours No results found for this basename: AMMONIA,  in the last 72 hours CBC:  Recent Labs  02/02/14 1258 02/03/14 0512  WBC 6.9 5.7  NEUTROABS 3.7  --   HGB 13.7 13.0  HCT 41.8 39.9  MCV 93.5 93.2  PLT 120* 99*   Cardiac Enzymes: No results found for this basename: CKTOTAL, CKMB, CKMBINDEX, TROPONINI,  in the last 72 hours BNP: No results found for this basename: PROBNP,  in the last 72 hours D-Dimer: No results found for this basename: DDIMER,  in the last 72 hours CBG: No results found for this basename: GLUCAP,  in the last 72 hours Hemoglobin A1C: No results found for this basename: HGBA1C,  in the last 72 hours Fasting Lipid Panel: No results found for this basename: CHOL, HDL, LDLCALC, TRIG,  CHOLHDL, LDLDIRECT,  in the last 72 hours Thyroid Function Tests:  Recent Labs  02/02/14 1258  TSH 3.556   Anemia Panel: No results found for this basename: VITAMINB12, FOLATE, FERRITIN, TIBC, IRON, RETICCTPCT,  in the last 72 hours Coagulation: No results found for this basename: LABPROT, INR,  in the last 72 hours Urine Drug Screen: Drugs of Abuse  No results found for this basename: labopia, cocainscrnur, labbenz, amphetmu, thcu, labbarb    Alcohol Level: No results found for this basename: ETH,  in the last 72 hours Urinalysis:  Recent Labs  02/02/14 1512  COLORURINE YELLOW  LABSPEC 1.020  PHURINE 6.0  GLUCOSEU NEGATIVE  HGBUR NEGATIVE  BILIRUBINUR NEGATIVE  KETONESUR NEGATIVE  PROTEINUR NEGATIVE  UROBILINOGEN 0.2  NITRITE NEGATIVE  LEUKOCYTESUR NEGATIVE   Misc. Labs:  ABGS No results found for this basename: PHART, PCO2, PO2ART, TCO2, HCO3,  in the last 72 hours CULTURES Recent Results (from the past 240 hour(s))  MRSA PCR SCREENING     Status: None   Collection Time    02/02/14  7:00 PM      Result Value Ref Range Status   MRSA by PCR NEGATIVE  NEGATIVE Final   Comment:            The GeneXpert MRSA Assay (FDA     approved for NASAL specimens     only), is one component of a  comprehensive MRSA colonization     surveillance program. It is not     intended to diagnose MRSA     infection nor to guide or     monitor treatment for     MRSA infections.   Studies/Results: Dg Lumbar Spine 2-3 Views  02/03/2014   CLINICAL DATA:  Low back pain, no injury  EXAM: LUMBAR SPINE - 2-3 VIEW  COMPARISON:  None.  FINDINGS: There is no evidence of lumbar spine fracture or dislocation. There is scoliosis of spine. There are facet joint degenerative change at L5-S1.  IMPRESSION: No acute fracture or dislocation.   Electronically Signed   By: Sherian ReinWei-Chen  Lin M.D.   On: 02/03/2014 02:22   Ct Head Wo Contrast  02/02/2014   CLINICAL DATA:  Slurred speech and difficulty  with walking  EXAM: CT HEAD WITHOUT CONTRAST  TECHNIQUE: Contiguous axial images were obtained from the base of the skull through the vertex without intravenous contrast. Study was obtained within 24 hr of patient's arrival at the emergency department.  COMPARISON:  August 15, 2012  FINDINGS: There is mild diffuse atrophy, stable. There is no appreciable mass, hemorrhage, extra-axial fluid collection, or midline shift. There is small vessel disease throughout the centra semiovale bilaterally, stable. There are small lacunar infarcts in the left lentiform nucleus, stable. There is no new gray-white compartment lesion. No acute infarct apparent.  The bony calvarium appears intact. The mastoid air cells are clear. There is debris in both external auditory canals.  IMPRESSION: Atrophy with supratentorial small vessel disease, stable. No intracranial mass, hemorrhage, or acute appearing infarct. Probable cerumen in each external 3auditory canal.   Electronically Signed   By: Bretta BangWilliam  Woodruff M.D.   On: 02/02/2014 13:01   Dg Pelvis Portable  02/03/2014   CLINICAL DATA:  Bilateral hip pain.  EXAM: PORTABLE PELVIS 1-2 VIEWS  COMPARISON:  CT abdomen and pelvis May 25, 2013  FINDINGS: There are marked chronic deformity of bilateral proximal femur with collapse and flattening of bilateral femoral head unchanged compared to prior CT pelvis of May 25, 2013. There is no acute fracture.  IMPRESSION: There are marked chronic deformity of bilateral proximal femur unchanged compared to prior CT pelvis of May 25, 2013. There is no acute fracture.   Electronically Signed   By: Sherian ReinWei-Chen  Lin M.D.   On: 02/03/2014 02:20   Portable Chest 1 View  02/03/2014   CLINICAL DATA:  Congestion, history of hypertension, Coronary artery disease.  EXAM: PORTABLE CHEST - 1 VIEW  COMPARISON:  May 20, 2013  FINDINGS: The heart size and mediastinal contours are stable. The aorta is tortuous. Both lungs are clear. The visualized skeletal structures  are stable.  IMPRESSION: No active cardiopulmonary disease.   Electronically Signed   By: Sherian ReinWei-Chen  Lin M.D.   On: 02/03/2014 02:17    Medications:  Prior to Admission:  Prescriptions prior to admission  Medication Sig Dispense Refill  . acetaminophen (TYLENOL) 325 MG tablet Take 2 tablets (650 mg total) by mouth every 4 (four) hours as needed.  60 tablet  12  . acidophilus (RISAQUAD) CAPS Take 2 capsules by mouth daily.  60 capsule  5  . albuterol (PROVENTIL) (5 MG/ML) 0.5% nebulizer solution Take 0.5 mLs (2.5 mg total) by nebulization every 4 (four) hours as needed for wheezing.  20 mL  12  . ALPRAZolam (XANAX) 0.5 MG tablet Take 1 tablet (0.5 mg total) by mouth every 4 (four) hours as needed for anxiety.  120  tablet  2  . divalproex (DEPAKOTE SPRINKLE) 125 MG capsule Take 125-250 mg by mouth 2 (two) times daily. Take two capsules in the morning and take one capsule at bedtime      . dronabinol (MARINOL) 2.5 MG capsule Take 1 capsule (2.5 mg total) by mouth 2 (two) times daily before lunch and supper.  60 capsule  0  . HYDROcodone-acetaminophen (NORCO/VICODIN) 5-325 MG per tablet Take 1-2 tablets by mouth every 4 (four) hours as needed.  120 tablet  5  . ipratropium (ATROVENT) 0.02 % nebulizer solution Take 2.5 mLs (0.5 mg total) by nebulization every 4 (four) hours as needed for wheezing.  75 mL  12  . Multiple Vitamins-Minerals (THEREMS-M) TABS Take 1 tablet by mouth daily.      . nebivolol (BYSTOLIC) 5 MG tablet Take 5 mg by mouth daily.      Marland Kitchen omeprazole (PRILOSEC) 20 MG capsule Take 20 mg by mouth daily.      . ondansetron (ZOFRAN) 4 MG tablet Take 1 tablet (4 mg total) by mouth every 8 (eight) hours as needed for nausea.  20 tablet  0  . polyethylene glycol (MIRALAX / GLYCOLAX) packet Take 17 g by mouth daily.  14 each  0  . rosuvastatin (CRESTOR) 10 MG tablet Take 10 mg by mouth at bedtime.      Marland Kitchen tiotropium (SPIRIVA) 18 MCG inhalation capsule Place 18 mcg into inhaler and inhale daily.       . traMADol (ULTRAM) 50 MG tablet Take 50 mg by mouth every morning.       . traZODone (DESYREL) 50 MG tablet Take 1 tablet (50 mg total) by mouth at bedtime as needed for sleep (insomnia).  30 tablet  12   Scheduled: . amLODipine  5 mg Oral Daily  . atorvastatin  20 mg Oral q1800  . divalproex  125 mg Oral QHS  . divalproex  250 mg Oral Daily  . dronabinol  2.5 mg Oral BID AC  . enoxaparin (LOVENOX) injection  40 mg Subcutaneous Q24H  . nebivolol  5 mg Oral Daily  . pantoprazole  40 mg Oral Daily  . polyethylene glycol  17 g Oral Daily  . tiotropium  18 mcg Inhalation Daily   Continuous: . sodium chloride 100 mL/hr at 02/03/14 1637   ZOX:[id number], acetaminophen, ALPRAZolam, HYDROcodone-acetaminophen, ipratropium, ondansetron (ZOFRAN) IV, ondansetron, traZODone  Assesment: She was admitted with dehydration and had some confusion related to that. This has improved. She has some baseline confusion which is complicated by the fact that she is illiterate. She has hypertension which is doing okay. She has aseptic necrosis of both hips but is not felt to be a surgical candidate because of her abdominal aortic aneurysm. Her abdominal aortic aneurysm will need to be reevaluated within the next month or so. She has chronic arthritis in multiple areas. She has COPD which is doing pretty well since she has stopped smoking Principal Problem:   Dehydration Active Problems:   Hypertension   COPD (chronic obstructive pulmonary disease)   AAA (abdominal aortic aneurysm) without rupture   Failure to thrive   Generalized weakness   Gait difficulty   Confusion   Low back pain    Plan: Discharge back to her assisted living facility    LOS: 2 days   Chrishawna Farina L 02/04/2014, 10:02 AM

## 2014-02-04 NOTE — Discharge Summary (Signed)
Physician Discharge Summary  Patient ID: [name] [name] MRN: [id number] DOB/AGE: February 25, 1935 79 y.o. Primary Care Physician:Bennet Kujawa L, MD Admit date: 02/02/2014 Discharge date: 02/04/2014    Discharge Diagnoses:   Principal Problem:   Dehydration Active Problems:   ARTHRITIS, HIPS, BILATERAL   Hypertension   COPD (chronic obstructive pulmonary disease)   AAA (abdominal aortic aneurysm) without rupture   Failure to thrive   Generalized weakness   Gait difficulty   Confusion   Low back pain     Medication List         acetaminophen 325 MG tablet  Commonly known as:  TYLENOL  Take 2 tablets (650 mg total) by mouth every 4 (four) hours as needed.     acidophilus Caps capsule  Take 2 capsules by mouth daily.     albuterol (5 MG/ML) 0.5% nebulizer solution  Commonly known as:  PROVENTIL  Take 0.5 mLs (2.5 mg total) by nebulization every 4 (four) hours as needed for wheezing.     ALPRAZolam 0.5 MG tablet  Commonly known as:  XANAX  Take 1 tablet (0.5 mg total) by mouth every 4 (four) hours as needed for anxiety.     divalproex 125 MG capsule  Commonly known as:  DEPAKOTE SPRINKLE  Take 125-250 mg by mouth 2 (two) times daily. Take two capsules in the morning and take one capsule at bedtime     dronabinol 2.5 MG capsule  Commonly known as:  MARINOL  Take 1 capsule (2.5 mg total) by mouth 2 (two) times daily before lunch and supper.     HYDROcodone-acetaminophen 5-325 MG per tablet  Commonly known as:  NORCO/VICODIN  Take 1-2 tablets by mouth every 4 (four) hours as needed.     ipratropium 0.02 % nebulizer solution  Commonly known as:  ATROVENT  Take 2.5 mLs (0.5 mg total) by nebulization every 4 (four) hours as needed for wheezing.     nebivolol 5 MG tablet  Commonly known as:  BYSTOLIC  Take 5 mg by mouth daily.     omeprazole 20 MG capsule  Commonly known as:  PRILOSEC  Take 20 mg by mouth daily.     ondansetron 4 MG tablet  Commonly known as:   ZOFRAN  Take 1 tablet (4 mg total) by mouth every 8 (eight) hours as needed for nausea.     polyethylene glycol packet  Commonly known as:  MIRALAX / GLYCOLAX  Take 17 g by mouth daily.     rosuvastatin 10 MG tablet  Commonly known as:  CRESTOR  Take 10 mg by mouth at bedtime.     THEREMS-M Tabs  Take 1 tablet by mouth daily.     tiotropium 18 MCG inhalation capsule  Commonly known as:  SPIRIVA  Place 18 mcg into inhaler and inhale daily.     traMADol 50 MG tablet  Commonly known as:  ULTRAM  Take 50 mg by mouth every morning.     traZODone 50 MG tablet  Commonly known as:  DESYREL  Take 1 tablet (50 mg total) by mouth at bedtime as needed for sleep (insomnia).        Discharged Condition: Improved    Consults: None  Significant Diagnostic Studies: Dg Lumbar Spine 2-3 Views  02/03/2014   CLINICAL DATA:  Low back pain, no injury  EXAM: LUMBAR SPINE - 2-3 VIEW  COMPARISON:  None.  FINDINGS: There is no evidence of lumbar spine fracture or dislocation. There is scoliosis of spine. There  are facet joint degenerative change at L5-S1.  IMPRESSION: No acute fracture or dislocation.   Electronically Signed   By: Abelardo Diesel M.D.   On: 02/03/2014 02:22   Ct Head Wo Contrast  02/02/2014   CLINICAL DATA:  Slurred speech and difficulty with walking  EXAM: CT HEAD WITHOUT CONTRAST  TECHNIQUE: Contiguous axial images were obtained from the base of the skull through the vertex without intravenous contrast. Study was obtained within 24 hr of patient's arrival at the emergency department.  COMPARISON:  August 15, 2012  FINDINGS: There is mild diffuse atrophy, stable. There is no appreciable mass, hemorrhage, extra-axial fluid collection, or midline shift. There is small vessel disease throughout the centra semiovale bilaterally, stable. There are small lacunar infarcts in the left lentiform nucleus, stable. There is no new gray-white compartment lesion. No acute infarct apparent.  The bony  calvarium appears intact. The mastoid air cells are clear. There is debris in both external auditory canals.  IMPRESSION: Atrophy with supratentorial small vessel disease, stable. No intracranial mass, hemorrhage, or acute appearing infarct. Probable cerumen in each external 3auditory canal.   Electronically Signed   By: Lowella Grip M.D.   On: 02/02/2014 13:01   Dg Pelvis Portable  02/03/2014   CLINICAL DATA:  Bilateral hip pain.  EXAM: PORTABLE PELVIS 1-2 VIEWS  COMPARISON:  CT abdomen and pelvis May 25, 2013  FINDINGS: There are marked chronic deformity of bilateral proximal femur with collapse and flattening of bilateral femoral head unchanged compared to prior CT pelvis of May 25, 2013. There is no acute fracture.  IMPRESSION: There are marked chronic deformity of bilateral proximal femur unchanged compared to prior CT pelvis of May 25, 2013. There is no acute fracture.   Electronically Signed   By: Abelardo Diesel M.D.   On: 02/03/2014 02:20   Portable Chest 1 View  02/03/2014   CLINICAL DATA:  Congestion, history of hypertension, Coronary artery disease.  EXAM: PORTABLE CHEST - 1 VIEW  COMPARISON:  May 20, 2013  FINDINGS: The heart size and mediastinal contours are stable. The aorta is tortuous. Both lungs are clear. The visualized skeletal structures are stable.  IMPRESSION: No active cardiopulmonary disease.   Electronically Signed   By: Abelardo Diesel M.D.   On: 02/03/2014 02:17    Lab Results: Basic Metabolic Panel:  Recent Labs  02/02/14 1258 02/03/14 0512  NA 141 140  K 4.5 4.0  CL 104 105  CO2 25 26  GLUCOSE 87 91  BUN 15 13  CREATININE 0.86 0.82  CALCIUM 9.3 8.8   Liver Function Tests: No results found for this basename: AST, ALT, ALKPHOS, BILITOT, PROT, ALBUMIN,  in the last 72 hours   CBC:  Recent Labs  02/02/14 1258 02/03/14 0512  WBC 6.9 5.7  NEUTROABS 3.7  --   HGB 13.7 13.0  HCT 41.8 39.9  MCV 93.5 93.2  PLT 120* 99*    Recent Results (from the past  240 hour(s))  MRSA PCR SCREENING     Status: None   Collection Time    02/02/14  7:00 PM      Result Value Ref Range Status   MRSA by PCR NEGATIVE  NEGATIVE Final   Comment:            The GeneXpert MRSA Assay (FDA     approved for NASAL specimens     only), is one component of a     comprehensive MRSA colonization  surveillance program. It is not     intended to diagnose MRSA     infection nor to guide or     monitor treatment for     MRSA infections.     Hospital Course: She was admitted with some confusion. She was found to be dehydrated. She has some confusion at baseline. 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 She has an abdominal aortic aneurysm so she can't have surgery. She had physical therapy evaluation and was felt to need outpatient physical therapy which is being arranged. She was given IV fluids and was much better at the time of discharge back at her baseline mental status.  Discharge Exam: Blood pressure 138/80, pulse 52, temperature 97.7 F (36.5 C), temperature source Oral, resp. rate 16, height 5\' 5"  (1.651 m), weight 69.6 kg (153 lb 7 oz), SpO2 95.00%. She is awake and alert. Her chest is clear. Her heart is regular. Her abdomen is soft.  Disposition: Discharge back to her assisted living facility with home health services      Discharge Orders   Future Appointments Provider Department Dept Phone   02/09/2014 8:30 AM [facility] [phone]   Eat a light meal the night before the exam Nothing to eat or drink for at least 8 hours before exam No gum chewing, or smoking the morning of the exam. Please take your morning medications with small sips of water, especially blood pressure medication *Very Important* Please wear 2 piece clothing   02/09/2014 9:00 AM Sharmon Leyden Nickel, NP [facility] [place] [phone]   Future Orders Complete By  Expires   Discharge patient  As directed    Face-to-face encounter (required for Medicare/Medicaid patients)  As directed    Comments:     I Jaymen Fetch L certify that this patient is under my care and that I, or a nurse practitioner or physician's assistant working with me, had a face-to-face encounter that meets the physician face-to-face encounter requirements with this patient on 02/04/2014. The encounter with the patient was in whole, or in part for the following medical condition(s) which is the primary reason for home health care (List medical condition): dehydration/physical deconditionig   Questions:     The encounter with the patient was in whole, or in part, for the following medical condition, which is the primary reason for home health care:  dehydration/physical deconditioning   I certify that, based on my findings, the following services are medically necessary home health services:  Nursing   Physical therapy   My clinical findings support the need for the above services:  Unable to leave home safely without assistance and/or assistive device   Further, I certify that my clinical findings support that this patient is homebound due to:  Unable to leave home safely without assistance   Reason for Medically Necessary Home Health Services:  Skilled Nursing- Change/Decline in Patient [place]  As directed    Questions:     To provide the following care/treatments:  PT   RN        Signed: Delyle Weider L   02/04/2014, 10:06 AM

## 2014-02-04 NOTE — Progress Notes (Signed)
Clinical Education officer, museum (CSW) faxed patient's FL2.   [name] [name], [place] CSW [phone]

## 2014-02-08 ENCOUNTER — Encounter: Payer: Self-pay | Admitting: Family

## 2014-02-09 ENCOUNTER — Ambulatory Visit: Payer: Medicare Other | Admitting: Family

## 2014-02-09 ENCOUNTER — Other Ambulatory Visit (HOSPITAL_COMMUNITY): Payer: Medicare Other

## 2014-03-01 ENCOUNTER — Other Ambulatory Visit (HOSPITAL_COMMUNITY): Payer: Self-pay | Admitting: Pulmonary Disease

## 2014-03-01 ENCOUNTER — Ambulatory Visit (HOSPITAL_COMMUNITY)
Admission: RE | Admit: 2014-03-01 | Discharge: 2014-03-01 | Disposition: A | Discharge status: Home / Self Care | Payer: Medicare Other | Source: Ambulatory Visit | Attending: Pulmonary Disease | Admitting: Pulmonary Disease

## 2014-03-01 DIAGNOSIS — F29 Unspecified psychosis not due to a substance or known physiological condition: Secondary | ICD-10-CM | POA: Diagnosis not present

## 2014-03-01 DIAGNOSIS — H5789 Other specified disorders of eye and adnexa: Secondary | ICD-10-CM | POA: Diagnosis not present

## 2014-03-01 DIAGNOSIS — I10 Essential (primary) hypertension: Secondary | ICD-10-CM | POA: Diagnosis not present

## 2014-03-01 DIAGNOSIS — S0510XA Contusion of eyeball and orbital tissues, unspecified eye, initial encounter: Secondary | ICD-10-CM | POA: Diagnosis not present

## 2014-03-01 DIAGNOSIS — W19XXXA Unspecified fall, initial encounter: Secondary | ICD-10-CM | POA: Insufficient documentation

## 2014-03-01 DIAGNOSIS — N390 Urinary tract infection, site not specified: Secondary | ICD-10-CM | POA: Diagnosis not present

## 2014-03-01 DIAGNOSIS — J449 Chronic obstructive pulmonary disease, unspecified: Secondary | ICD-10-CM | POA: Diagnosis not present

## 2014-03-01 DIAGNOSIS — I719 Aortic aneurysm of unspecified site, without rupture: Secondary | ICD-10-CM | POA: Diagnosis not present

## 2014-03-14 DIAGNOSIS — I129 Hypertensive chronic kidney disease with stage 1 through stage 4 chronic kidney disease, or unspecified chronic kidney disease: Secondary | ICD-10-CM | POA: Diagnosis not present

## 2014-03-14 DIAGNOSIS — J449 Chronic obstructive pulmonary disease, unspecified: Secondary | ICD-10-CM | POA: Diagnosis not present

## 2014-03-14 DIAGNOSIS — Z87440 Personal history of urinary (tract) infections: Secondary | ICD-10-CM | POA: Diagnosis not present

## 2014-03-14 DIAGNOSIS — I739 Peripheral vascular disease, unspecified: Secondary | ICD-10-CM | POA: Diagnosis not present

## 2014-03-14 DIAGNOSIS — M1610 Unilateral primary osteoarthritis, unspecified hip: Secondary | ICD-10-CM | POA: Diagnosis not present

## 2014-03-14 DIAGNOSIS — R627 Adult failure to thrive: Secondary | ICD-10-CM | POA: Diagnosis not present

## 2014-03-14 DIAGNOSIS — N189 Chronic kidney disease, unspecified: Secondary | ICD-10-CM | POA: Diagnosis not present

## 2014-03-18 DIAGNOSIS — I129 Hypertensive chronic kidney disease with stage 1 through stage 4 chronic kidney disease, or unspecified chronic kidney disease: Secondary | ICD-10-CM | POA: Diagnosis not present

## 2014-03-18 DIAGNOSIS — R627 Adult failure to thrive: Secondary | ICD-10-CM | POA: Diagnosis not present

## 2014-03-18 DIAGNOSIS — J449 Chronic obstructive pulmonary disease, unspecified: Secondary | ICD-10-CM | POA: Diagnosis not present

## 2014-03-18 DIAGNOSIS — N189 Chronic kidney disease, unspecified: Secondary | ICD-10-CM | POA: Diagnosis not present

## 2014-03-18 DIAGNOSIS — I739 Peripheral vascular disease, unspecified: Secondary | ICD-10-CM | POA: Diagnosis not present

## 2014-03-18 DIAGNOSIS — M1610 Unilateral primary osteoarthritis, unspecified hip: Secondary | ICD-10-CM | POA: Diagnosis not present

## 2014-03-20 DIAGNOSIS — N189 Chronic kidney disease, unspecified: Secondary | ICD-10-CM | POA: Diagnosis not present

## 2014-03-20 DIAGNOSIS — I739 Peripheral vascular disease, unspecified: Secondary | ICD-10-CM | POA: Diagnosis not present

## 2014-03-20 DIAGNOSIS — M1610 Unilateral primary osteoarthritis, unspecified hip: Secondary | ICD-10-CM | POA: Diagnosis not present

## 2014-03-20 DIAGNOSIS — R627 Adult failure to thrive: Secondary | ICD-10-CM | POA: Diagnosis not present

## 2014-03-20 DIAGNOSIS — J449 Chronic obstructive pulmonary disease, unspecified: Secondary | ICD-10-CM | POA: Diagnosis not present

## 2014-03-20 DIAGNOSIS — I129 Hypertensive chronic kidney disease with stage 1 through stage 4 chronic kidney disease, or unspecified chronic kidney disease: Secondary | ICD-10-CM | POA: Diagnosis not present

## 2014-03-22 DIAGNOSIS — J449 Chronic obstructive pulmonary disease, unspecified: Secondary | ICD-10-CM | POA: Diagnosis not present

## 2014-03-22 DIAGNOSIS — I739 Peripheral vascular disease, unspecified: Secondary | ICD-10-CM | POA: Diagnosis not present

## 2014-03-22 DIAGNOSIS — I129 Hypertensive chronic kidney disease with stage 1 through stage 4 chronic kidney disease, or unspecified chronic kidney disease: Secondary | ICD-10-CM | POA: Diagnosis not present

## 2014-03-22 DIAGNOSIS — R627 Adult failure to thrive: Secondary | ICD-10-CM | POA: Diagnosis not present

## 2014-03-22 DIAGNOSIS — M1610 Unilateral primary osteoarthritis, unspecified hip: Secondary | ICD-10-CM | POA: Diagnosis not present

## 2014-03-22 DIAGNOSIS — N189 Chronic kidney disease, unspecified: Secondary | ICD-10-CM | POA: Diagnosis not present

## 2014-03-23 DIAGNOSIS — N189 Chronic kidney disease, unspecified: Secondary | ICD-10-CM | POA: Diagnosis not present

## 2014-03-23 DIAGNOSIS — R627 Adult failure to thrive: Secondary | ICD-10-CM | POA: Diagnosis not present

## 2014-03-23 DIAGNOSIS — I129 Hypertensive chronic kidney disease with stage 1 through stage 4 chronic kidney disease, or unspecified chronic kidney disease: Secondary | ICD-10-CM | POA: Diagnosis not present

## 2014-03-23 DIAGNOSIS — M1610 Unilateral primary osteoarthritis, unspecified hip: Secondary | ICD-10-CM | POA: Diagnosis not present

## 2014-03-23 DIAGNOSIS — J449 Chronic obstructive pulmonary disease, unspecified: Secondary | ICD-10-CM | POA: Diagnosis not present

## 2014-03-23 DIAGNOSIS — I739 Peripheral vascular disease, unspecified: Secondary | ICD-10-CM | POA: Diagnosis not present

## 2014-03-24 DIAGNOSIS — I129 Hypertensive chronic kidney disease with stage 1 through stage 4 chronic kidney disease, or unspecified chronic kidney disease: Secondary | ICD-10-CM | POA: Diagnosis not present

## 2014-03-24 DIAGNOSIS — R627 Adult failure to thrive: Secondary | ICD-10-CM | POA: Diagnosis not present

## 2014-03-24 DIAGNOSIS — J449 Chronic obstructive pulmonary disease, unspecified: Secondary | ICD-10-CM | POA: Diagnosis not present

## 2014-03-24 DIAGNOSIS — M1610 Unilateral primary osteoarthritis, unspecified hip: Secondary | ICD-10-CM | POA: Diagnosis not present

## 2014-03-24 DIAGNOSIS — I739 Peripheral vascular disease, unspecified: Secondary | ICD-10-CM | POA: Diagnosis not present

## 2014-03-24 DIAGNOSIS — N189 Chronic kidney disease, unspecified: Secondary | ICD-10-CM | POA: Diagnosis not present

## 2014-03-27 DIAGNOSIS — M1610 Unilateral primary osteoarthritis, unspecified hip: Secondary | ICD-10-CM | POA: Diagnosis not present

## 2014-03-27 DIAGNOSIS — N189 Chronic kidney disease, unspecified: Secondary | ICD-10-CM | POA: Diagnosis not present

## 2014-03-27 DIAGNOSIS — I129 Hypertensive chronic kidney disease with stage 1 through stage 4 chronic kidney disease, or unspecified chronic kidney disease: Secondary | ICD-10-CM | POA: Diagnosis not present

## 2014-03-27 DIAGNOSIS — J449 Chronic obstructive pulmonary disease, unspecified: Secondary | ICD-10-CM | POA: Diagnosis not present

## 2014-03-27 DIAGNOSIS — R627 Adult failure to thrive: Secondary | ICD-10-CM | POA: Diagnosis not present

## 2014-03-27 DIAGNOSIS — I739 Peripheral vascular disease, unspecified: Secondary | ICD-10-CM | POA: Diagnosis not present

## 2014-03-28 DIAGNOSIS — N189 Chronic kidney disease, unspecified: Secondary | ICD-10-CM | POA: Diagnosis not present

## 2014-03-28 DIAGNOSIS — I739 Peripheral vascular disease, unspecified: Secondary | ICD-10-CM | POA: Diagnosis not present

## 2014-03-28 DIAGNOSIS — J449 Chronic obstructive pulmonary disease, unspecified: Secondary | ICD-10-CM | POA: Diagnosis not present

## 2014-03-28 DIAGNOSIS — M1610 Unilateral primary osteoarthritis, unspecified hip: Secondary | ICD-10-CM | POA: Diagnosis not present

## 2014-03-28 DIAGNOSIS — I129 Hypertensive chronic kidney disease with stage 1 through stage 4 chronic kidney disease, or unspecified chronic kidney disease: Secondary | ICD-10-CM | POA: Diagnosis not present

## 2014-03-28 DIAGNOSIS — R627 Adult failure to thrive: Secondary | ICD-10-CM | POA: Diagnosis not present

## 2014-03-29 DIAGNOSIS — I129 Hypertensive chronic kidney disease with stage 1 through stage 4 chronic kidney disease, or unspecified chronic kidney disease: Secondary | ICD-10-CM | POA: Diagnosis not present

## 2014-03-29 DIAGNOSIS — J449 Chronic obstructive pulmonary disease, unspecified: Secondary | ICD-10-CM | POA: Diagnosis not present

## 2014-03-29 DIAGNOSIS — M1610 Unilateral primary osteoarthritis, unspecified hip: Secondary | ICD-10-CM | POA: Diagnosis not present

## 2014-03-29 DIAGNOSIS — R627 Adult failure to thrive: Secondary | ICD-10-CM | POA: Diagnosis not present

## 2014-03-29 DIAGNOSIS — N189 Chronic kidney disease, unspecified: Secondary | ICD-10-CM | POA: Diagnosis not present

## 2014-03-29 DIAGNOSIS — I739 Peripheral vascular disease, unspecified: Secondary | ICD-10-CM | POA: Diagnosis not present

## 2014-03-31 DIAGNOSIS — J449 Chronic obstructive pulmonary disease, unspecified: Secondary | ICD-10-CM | POA: Diagnosis not present

## 2014-03-31 DIAGNOSIS — I129 Hypertensive chronic kidney disease with stage 1 through stage 4 chronic kidney disease, or unspecified chronic kidney disease: Secondary | ICD-10-CM | POA: Diagnosis not present

## 2014-03-31 DIAGNOSIS — N189 Chronic kidney disease, unspecified: Secondary | ICD-10-CM | POA: Diagnosis not present

## 2014-03-31 DIAGNOSIS — I739 Peripheral vascular disease, unspecified: Secondary | ICD-10-CM | POA: Diagnosis not present

## 2014-03-31 DIAGNOSIS — M1610 Unilateral primary osteoarthritis, unspecified hip: Secondary | ICD-10-CM | POA: Diagnosis not present

## 2014-03-31 DIAGNOSIS — R627 Adult failure to thrive: Secondary | ICD-10-CM | POA: Diagnosis not present

## 2014-04-03 DIAGNOSIS — M1610 Unilateral primary osteoarthritis, unspecified hip: Secondary | ICD-10-CM | POA: Diagnosis not present

## 2014-04-03 DIAGNOSIS — I739 Peripheral vascular disease, unspecified: Secondary | ICD-10-CM | POA: Diagnosis not present

## 2014-04-03 DIAGNOSIS — R627 Adult failure to thrive: Secondary | ICD-10-CM | POA: Diagnosis not present

## 2014-04-03 DIAGNOSIS — J449 Chronic obstructive pulmonary disease, unspecified: Secondary | ICD-10-CM | POA: Diagnosis not present

## 2014-04-03 DIAGNOSIS — N189 Chronic kidney disease, unspecified: Secondary | ICD-10-CM | POA: Diagnosis not present

## 2014-04-03 DIAGNOSIS — I129 Hypertensive chronic kidney disease with stage 1 through stage 4 chronic kidney disease, or unspecified chronic kidney disease: Secondary | ICD-10-CM | POA: Diagnosis not present

## 2014-04-06 DIAGNOSIS — R627 Adult failure to thrive: Secondary | ICD-10-CM | POA: Diagnosis not present

## 2014-04-06 DIAGNOSIS — M1610 Unilateral primary osteoarthritis, unspecified hip: Secondary | ICD-10-CM | POA: Diagnosis not present

## 2014-04-06 DIAGNOSIS — I129 Hypertensive chronic kidney disease with stage 1 through stage 4 chronic kidney disease, or unspecified chronic kidney disease: Secondary | ICD-10-CM | POA: Diagnosis not present

## 2014-04-06 DIAGNOSIS — I739 Peripheral vascular disease, unspecified: Secondary | ICD-10-CM | POA: Diagnosis not present

## 2014-04-06 DIAGNOSIS — J449 Chronic obstructive pulmonary disease, unspecified: Secondary | ICD-10-CM | POA: Diagnosis not present

## 2014-04-06 DIAGNOSIS — N189 Chronic kidney disease, unspecified: Secondary | ICD-10-CM | POA: Diagnosis not present

## 2014-04-10 DIAGNOSIS — J449 Chronic obstructive pulmonary disease, unspecified: Secondary | ICD-10-CM | POA: Diagnosis not present

## 2014-04-10 DIAGNOSIS — I739 Peripheral vascular disease, unspecified: Secondary | ICD-10-CM | POA: Diagnosis not present

## 2014-04-10 DIAGNOSIS — M1610 Unilateral primary osteoarthritis, unspecified hip: Secondary | ICD-10-CM | POA: Diagnosis not present

## 2014-04-10 DIAGNOSIS — N189 Chronic kidney disease, unspecified: Secondary | ICD-10-CM | POA: Diagnosis not present

## 2014-04-10 DIAGNOSIS — R627 Adult failure to thrive: Secondary | ICD-10-CM | POA: Diagnosis not present

## 2014-04-10 DIAGNOSIS — I129 Hypertensive chronic kidney disease with stage 1 through stage 4 chronic kidney disease, or unspecified chronic kidney disease: Secondary | ICD-10-CM | POA: Diagnosis not present

## 2014-04-11 DIAGNOSIS — R627 Adult failure to thrive: Secondary | ICD-10-CM | POA: Diagnosis not present

## 2014-04-11 DIAGNOSIS — I129 Hypertensive chronic kidney disease with stage 1 through stage 4 chronic kidney disease, or unspecified chronic kidney disease: Secondary | ICD-10-CM | POA: Diagnosis not present

## 2014-04-11 DIAGNOSIS — J449 Chronic obstructive pulmonary disease, unspecified: Secondary | ICD-10-CM | POA: Diagnosis not present

## 2014-04-11 DIAGNOSIS — N189 Chronic kidney disease, unspecified: Secondary | ICD-10-CM | POA: Diagnosis not present

## 2014-04-11 DIAGNOSIS — M1610 Unilateral primary osteoarthritis, unspecified hip: Secondary | ICD-10-CM | POA: Diagnosis not present

## 2014-04-11 DIAGNOSIS — I739 Peripheral vascular disease, unspecified: Secondary | ICD-10-CM | POA: Diagnosis not present

## 2014-04-12 ENCOUNTER — Encounter: Payer: Self-pay | Admitting: *Deleted

## 2014-04-12 ENCOUNTER — Encounter: Payer: Self-pay | Admitting: Family

## 2014-04-12 NOTE — Telephone Encounter (Signed)
error 

## 2014-04-13 ENCOUNTER — Ambulatory Visit: Payer: Medicare Other | Admitting: Family

## 2014-04-13 ENCOUNTER — Other Ambulatory Visit (HOSPITAL_COMMUNITY): Payer: Medicare Other

## 2014-04-18 DIAGNOSIS — I739 Peripheral vascular disease, unspecified: Secondary | ICD-10-CM | POA: Diagnosis not present

## 2014-04-18 DIAGNOSIS — M1610 Unilateral primary osteoarthritis, unspecified hip: Secondary | ICD-10-CM | POA: Diagnosis not present

## 2014-04-18 DIAGNOSIS — R627 Adult failure to thrive: Secondary | ICD-10-CM | POA: Diagnosis not present

## 2014-04-18 DIAGNOSIS — N189 Chronic kidney disease, unspecified: Secondary | ICD-10-CM | POA: Diagnosis not present

## 2014-04-18 DIAGNOSIS — J449 Chronic obstructive pulmonary disease, unspecified: Secondary | ICD-10-CM | POA: Diagnosis not present

## 2014-04-18 DIAGNOSIS — I129 Hypertensive chronic kidney disease with stage 1 through stage 4 chronic kidney disease, or unspecified chronic kidney disease: Secondary | ICD-10-CM | POA: Diagnosis not present

## 2014-04-20 ENCOUNTER — Ambulatory Visit (HOSPITAL_COMMUNITY): Admission: RE | Admit: 2014-04-20 | Payer: Medicare Other | Source: Ambulatory Visit

## 2014-04-20 NOTE — Progress Notes (Signed)
erroneous encounter. Order cancelled and placed in correct chart.

## 2014-05-10 DIAGNOSIS — I739 Peripheral vascular disease, unspecified: Secondary | ICD-10-CM | POA: Diagnosis not present

## 2014-05-10 DIAGNOSIS — N189 Chronic kidney disease, unspecified: Secondary | ICD-10-CM | POA: Diagnosis not present

## 2014-05-10 DIAGNOSIS — R627 Adult failure to thrive: Secondary | ICD-10-CM | POA: Diagnosis not present

## 2014-05-10 DIAGNOSIS — M1610 Unilateral primary osteoarthritis, unspecified hip: Secondary | ICD-10-CM | POA: Diagnosis not present

## 2014-05-10 DIAGNOSIS — I129 Hypertensive chronic kidney disease with stage 1 through stage 4 chronic kidney disease, or unspecified chronic kidney disease: Secondary | ICD-10-CM | POA: Diagnosis not present

## 2014-05-10 DIAGNOSIS — J449 Chronic obstructive pulmonary disease, unspecified: Secondary | ICD-10-CM | POA: Diagnosis not present

## 2014-05-31 ENCOUNTER — Encounter: Payer: Self-pay | Admitting: Family

## 2014-06-01 ENCOUNTER — Ambulatory Visit (INDEPENDENT_AMBULATORY_CARE_PROVIDER_SITE_OTHER): Payer: Medicare Other | Admitting: Family

## 2014-06-01 ENCOUNTER — Encounter: Payer: Self-pay | Admitting: Family

## 2014-06-01 ENCOUNTER — Other Ambulatory Visit: Payer: Self-pay | Admitting: Vascular Surgery

## 2014-06-01 ENCOUNTER — Ambulatory Visit (HOSPITAL_COMMUNITY)
Admission: RE | Admit: 2014-06-01 | Discharge: 2014-06-01 | Disposition: A | Discharge status: Home / Self Care | Payer: Medicare Other | Source: Ambulatory Visit | Attending: Family | Admitting: Family

## 2014-06-01 VITALS — BP 120/67 | HR 48 | Resp 14 | Ht 64.0 in | Wt 153.0 lb

## 2014-06-01 DIAGNOSIS — I714 Abdominal aortic aneurysm, without rupture, unspecified: Secondary | ICD-10-CM

## 2014-06-01 DIAGNOSIS — I729 Aneurysm of unspecified site: Secondary | ICD-10-CM

## 2014-06-01 DIAGNOSIS — R1032 Left lower quadrant pain: Secondary | ICD-10-CM | POA: Diagnosis not present

## 2014-06-01 NOTE — Progress Notes (Addendum)
[facility] [place] [facility]  Established Abdominal Aortic Aneurysm  History of Present Illness  [name] [name] is a 79 y.o. ([birth year]) female patient of Dr. Scot Dock followed for known AAA. The patient denies any unusual abdominal or back pain. The patient has multiple comorbidities which makes elective repair extremely risky.  Previous studies demonstrate an AAA, measuring 5.5 cm, today's maximum diameter measurement is 5.9 cm. The patient does have low mid back for the last 2 years, is worsening, pain radiates to both legs, denis abdominal pain today, but did have the 2 prior days. The patient is a former smoker, smoked since age 53, quit 5 years ago.  Walking is limited by severe OA in left hip, does not seem to have claudication symptoms. Physical therapy helped hip pain, but after PT is over, pain returns.  Lost 49 pounds in a year, states long standing poor appetite, she reports Dr. Luan Pulling aware.   Past Medical History  Diagnosis Date  . Hypertension   . Hyperlipidemia   . COPD (chronic obstructive pulmonary disease)     multiple hospitalizations for exacerbations with acute bronchitis  . Degenerative joint disease     Bilateral hips  . Depression with anxiety   . GERD (gastroesophageal reflux disease)     Hiatal hernia  . Atrial arrhythmia     Atrial fibrillation and atrial flutter diagnosed in the past;?  PSVT  . AAA (abdominal aortic aneurysm)     infrarenal; 07/2012: Diameter of 5.4 cm  . Arteriosclerotic cardiovascular disease (ASCVD)     Cardiac cath in 03/2000->  80% LAD, 70-80% RCA;  stent x1 in the LAD and x2 in the RCA with excellent angiographic outcome; repeat catheterization in late 2001 and 2003 revealed no restenosis; EF-50%  . Tobacco abuse, in remission     Quit in 2006; total consumption of 50-75 pack years  . Pituitary microadenoma     transsphenoidal resection in 09/2007  . Anemia   . Coronary artery disease   . PVD (peripheral vascular  disease)   . Gall stones   . Hypokalemia   . Ileus   . SBO (small bowel obstruction)   . Hypernatremia   . Nephrolithiasis   . Renal failure   . DJD (degenerative joint disease)   . Encephalopathy    Past Surgical History  Procedure Laterality Date  . Kidney stone surgery    . Transphenoidal / transnasal hypophysectomy / resection pituitary tumor  09/2007  . Cardiac catheterization  2001    post PTCA and stenting of her LAD and RCA  . Appendectomy N/A 05/25/2013    Procedure: APPENDECTOMY;  Surgeon: Donato Heinz, MD;  Location: [facility];  Service: General;  Laterality: N/A;   Social History History   Social History  . Marital Status: Widowed    Spouse Name: N/A    Number of Children: N/A  . Years of Education: N/A   Occupational History  . Not on file.   Social History Main Topics  . Smoking status: Former Smoker -- 1.50 packs/day for 50 years    Types: Cigarettes    Quit date: 05/27/2005  . Smokeless tobacco: Former Systems developer    Quit date: 01/11/2008  . Alcohol Use: No  . Drug Use: No  . Sexual Activity: Not Currently   Other Topics Concern  . Not on file   Social History Narrative  . No narrative on file   Family History Family History  Problem Relation Age of Onset  .  Heart disease Mother   . Hyperlipidemia Mother   . Hypertension Mother   . Stroke Mother   . Cancer Brother     Current Outpatient Prescriptions on File Prior to Visit  Medication Sig Dispense Refill  . acetaminophen (TYLENOL) 325 MG tablet Take 2 tablets (650 mg total) by mouth every 4 (four) hours as needed.  60 tablet  12  . acidophilus (RISAQUAD) CAPS Take 2 capsules by mouth daily.  60 capsule  5  . ALPRAZolam (XANAX) 0.5 MG tablet Take 1 tablet (0.5 mg total) by mouth every 4 (four) hours as needed for anxiety.  120 tablet  2  . divalproex (DEPAKOTE SPRINKLE) 125 MG capsule Take 125-250 mg by mouth 2 (two) times daily. Take two capsules in the morning and take one capsule at bedtime       . dronabinol (MARINOL) 2.5 MG capsule Take 1 capsule (2.5 mg total) by mouth 2 (two) times daily before lunch and supper.  60 capsule  0  . HYDROcodone-acetaminophen (NORCO/VICODIN) 5-325 MG per tablet Take 1-2 tablets by mouth every 4 (four) hours as needed.  120 tablet  5  . Multiple Vitamins-Minerals (THEREMS-M) TABS Take 1 tablet by mouth daily.      . nebivolol (BYSTOLIC) 5 MG tablet Take 5 mg by mouth daily.      Marland Kitchen omeprazole (PRILOSEC) 20 MG capsule Take 20 mg by mouth daily.      . rosuvastatin (CRESTOR) 10 MG tablet Take 10 mg by mouth at bedtime.      Marland Kitchen tiotropium (SPIRIVA) 18 MCG inhalation capsule Place 18 mcg into inhaler and inhale daily.      . traMADol (ULTRAM) 50 MG tablet Take 50 mg by mouth every morning.       Marland Kitchen albuterol (PROVENTIL) (5 MG/ML) 0.5% nebulizer solution Take 0.5 mLs (2.5 mg total) by nebulization every 4 (four) hours as needed for wheezing.  20 mL  12  . ipratropium (ATROVENT) 0.02 % nebulizer solution Take 2.5 mLs (0.5 mg total) by nebulization every 4 (four) hours as needed for wheezing.  75 mL  12  . ondansetron (ZOFRAN) 4 MG tablet Take 1 tablet (4 mg total) by mouth every 8 (eight) hours as needed for nausea.  20 tablet  0  . polyethylene glycol (MIRALAX / GLYCOLAX) packet Take 17 g by mouth daily.  14 each  0  . traZODone (DESYREL) 50 MG tablet Take 1 tablet (50 mg total) by mouth at bedtime as needed for sleep (insomnia).  30 tablet  12   No current facility-administered medications on file prior to visit.   Allergies  Allergen Reactions  . Zoloft [Sertraline Hcl] Anxiety  . Asa [Aspirin] Other (See Comments)    "my ears bleed"  . Lidocaine Other (See Comments)    unknown  . Nsaids Other (See Comments)    unknown  . Sulfonamide Derivatives Other (See Comments)    unknown  . Ativan [Lorazepam] Anxiety    Per daughter    ROS: See HPI for pertinent positives and negatives.  Physical Examination  Filed Vitals:   06/01/14 1053  BP: 120/67   Pulse: 48  Resp: 14  Height: 5\' 4"  (1.626 m)  Weight: 153 lb (69.4 kg)  SpO2: 98%   Body mass index is 26.25 kg/(m^2).  General: A&O x 3, WD, hard of hearing  Pulmonary: Respirations non labored Cardiac: RRR, Nl S1, S2, no Murmurs, no atrial fibrillation at this time. Pitting pretibial edema: 1+ left, 2+  right.  Vascular:  Vessel  Right  Left   Radial  2+Palpable  2+Palpable   Brachial  Palpable  Palpable   Carotid  Palpable, without bruit  Palpable, without bruit   Aorta  Not palpable  N/A   Femoral  Palpable  Palpable   Popliteal  Not palpable  Not palpable   PT  Not Palpable  Not Palpable   DP  2+Palpable  2+Palpable   Gastrointestinal: soft, NTND, no pulsatile mass.  Musculoskeletal: M/S 5/5 throughout except lower extremities, Extremities without ischemic changes.  Neurologic: CN 2-12 intact, Pain and light touch intact in extremities except , Motor exam as listed above    Non-Invasive Vascular Imaging  AAA Duplex (06/01/2014) ABDOMINAL AORTA DUPLEX EVALUATION    INDICATION: Follow up aneurysm    PREVIOUS INTERVENTION(S): None    DUPLEX EXAM: Limited aorta duplex    LOCATION DIAMETER AP (cm) DIAMETER TRANSVERSE (cm) VELOCITIES (cm/sec)  Aorta Proximal 2.2 2.2 53  Aorta Mid 4.7 - 72  Aorta Distal 5.9 5.5 25  Right Common Iliac Artery N/V - -  Left Common Iliac Artery N/V - -    Previous max aortic diameter:  5.4 x 5.5 Date: 08/10/2013     ADDITIONAL FINDINGS: Residual lumen 2.67 cm.    IMPRESSION: Aneurysmal dilatation of the mid to distal abdominal aorta with a maximum diameter of 5.9 x 5.5cm.    Compared to the previous exam:  Increase in size    Medical Decision Making  The patient is a 79 y.o. female who presents with asymptomatic AAA with slight increase in size to 5.9 cm at largest dimension. The patient has multiple comorbidities which makes elective repair extremely risky.    Based on this patient's exam and diagnostic studies, and after  discussing with Dr. Oneida Alar, the patient will follow up with the following studies: CT angiogram abdomen/pelvis for evaluation of AAA, follow up with Dr. Scot Dock.  Consideration for repair of AAA would be made when the size reaches 5.5 cm, growth > 1 cm/yr, and symptomatic status.  I emphasized the importance of maximal medical management including strict control of blood pressure, blood glucose, and lipid levels, antiplatelet agents, obtaining regular exercise, and continued  cessation of smoking.   The patient was given information about AAA including signs, symptoms, treatment, and how to minimize the risk of enlargement and rupture of aneurysms.    The patient was advised to call [phone] should the patient experience sudden onset abdominal or back pain.   Thank you for allowing [place] to participate in this patient's care.  Clemon Chambers, RN, MSN, FNP-C [facility] Office: [phone]  Clinic Physician: Oneida Alar  06/01/2014, 11:03 AM

## 2014-06-06 ENCOUNTER — Encounter: Payer: Self-pay | Admitting: Vascular Surgery

## 2014-06-06 DIAGNOSIS — I714 Abdominal aortic aneurysm, without rupture, unspecified: Secondary | ICD-10-CM | POA: Diagnosis not present

## 2014-06-07 ENCOUNTER — Encounter: Payer: Self-pay | Admitting: Vascular Surgery

## 2014-06-07 ENCOUNTER — Ambulatory Visit (INDEPENDENT_AMBULATORY_CARE_PROVIDER_SITE_OTHER): Payer: Medicare Other | Admitting: Vascular Surgery

## 2014-06-07 ENCOUNTER — Ambulatory Visit
Admission: RE | Admit: 2014-06-07 | Discharge: 2014-06-07 | Disposition: A | Discharge status: Home / Self Care | Payer: Medicare Other | Source: Ambulatory Visit | Attending: Vascular Surgery | Admitting: Vascular Surgery

## 2014-06-07 VITALS — BP 153/76 | HR 63 | Resp 16 | Ht 64.0 in | Wt 120.0 lb

## 2014-06-07 DIAGNOSIS — R1032 Left lower quadrant pain: Secondary | ICD-10-CM | POA: Diagnosis not present

## 2014-06-07 DIAGNOSIS — I714 Abdominal aortic aneurysm, without rupture, unspecified: Secondary | ICD-10-CM

## 2014-06-07 MED ORDER — IOHEXOL 350 MG/ML SOLN
80.0000 mL | Freq: Once | INTRAVENOUS | Status: AC | PRN
  Administered 2014-06-07: 80 mL via INTRAVENOUS

## 2014-06-07 NOTE — Progress Notes (Signed)
Patient ID: [name] [name], female   DOB: December 28, 1934, 79 y.o.   MRN: [id number]  Reason for Visit: AAA   Referred by Alonza Bogus, MD  Subjective:     HPI:  [name] M Constantin is a 79 y.o. female who we have been following with an abdominal aortic aneurysm. I last saw her in August of 2013 when the aneurysm was 5.3 cm in maximum diameter. She was last seen in our office by our nurse practitioner on 06/01/2014. At that time it was noted that the maximum diameter by ultrasound was 5.9 cm. She was set up for a CT scan and follow up visit.  She denies any acute onset abdominal pain or back pain. She lives in an assisted living facility and according to her daughter is essentially nonambulatory for the most part confined to the wheelchair. Her appetite is fair.  She does have a history of coronary artery disease and also COPD. She's had previous PTCA. Her cardiologist is Dr. Claiborne Billings.  Past Medical History  Diagnosis Date  . Hypertension   . Hyperlipidemia   . COPD (chronic obstructive pulmonary disease)     multiple hospitalizations for exacerbations with acute bronchitis  . Degenerative joint disease     Bilateral hips  . Depression with anxiety   . GERD (gastroesophageal reflux disease)     Hiatal hernia  . Atrial arrhythmia     Atrial fibrillation and atrial flutter diagnosed in the past;?  PSVT  . AAA (abdominal aortic aneurysm)     infrarenal; 07/2012: Diameter of 5.4 cm  . Arteriosclerotic cardiovascular disease (ASCVD)     Cardiac cath in 03/2000->  80% LAD, 70-80% RCA;  stent x1 in the LAD and x2 in the RCA with excellent angiographic outcome; repeat catheterization in late 2001 and 2003 revealed no restenosis; EF-50%  . Tobacco abuse, in remission     Quit in 2006; total consumption of 50-75 pack years  . Pituitary microadenoma     transsphenoidal resection in 09/2007  . Anemia   . Coronary artery disease   . PVD (peripheral vascular disease)   . Gall stones   .  Hypokalemia   . Ileus   . SBO (small bowel obstruction)   . Hypernatremia   . Nephrolithiasis   . Renal failure   . DJD (degenerative joint disease)   . Encephalopathy    Family History  Problem Relation Age of Onset  . Heart disease Mother   . Hyperlipidemia Mother   . Hypertension Mother   . Stroke Mother   . Cancer Brother    Past Surgical History  Procedure Laterality Date  . Kidney stone surgery    . Transphenoidal / transnasal hypophysectomy / resection pituitary tumor  09/2007  . Cardiac catheterization  2001    post PTCA and stenting of her LAD and RCA  . Appendectomy N/A 05/25/2013    Procedure: APPENDECTOMY;  Surgeon: Donato Heinz, MD;  Location: [facility];  Service: General;  Laterality: N/A;   Short Social History:  History  Substance Use Topics  . Smoking status: Former Smoker -- 1.50 packs/day for 50 years    Types: Cigarettes    Quit date: 05/27/2005  . Smokeless tobacco: Former Systems developer    Quit date: 01/11/2008  . Alcohol Use: No   Allergies  Allergen Reactions  . Zoloft [Sertraline Hcl] Anxiety  . Asa [Aspirin] Other (See Comments)    "my ears bleed"  . Lidocaine Other (See Comments)  unknown  . Nsaids Other (See Comments)    unknown  . Sulfonamide Derivatives Other (See Comments)    unknown  . Ativan [Lorazepam] Anxiety    Per daughter   Current Outpatient Prescriptions  Medication Sig Dispense Refill  . acetaminophen (TYLENOL) 325 MG tablet Take 2 tablets (650 mg total) by mouth every 4 (four) hours as needed.  60 tablet  12  . acidophilus (RISAQUAD) CAPS Take 2 capsules by mouth daily.  60 capsule  5  . ALPRAZolam (XANAX) 0.5 MG tablet Take 1 tablet (0.5 mg total) by mouth every 4 (four) hours as needed for anxiety.  120 tablet  2  . divalproex (DEPAKOTE SPRINKLE) 125 MG capsule Take 125-250 mg by mouth 2 (two) times daily. Take two capsules in the morning and take one capsule at bedtime      . dronabinol (MARINOL) 2.5 MG capsule Take 1 capsule  (2.5 mg total) by mouth 2 (two) times daily before lunch and supper.  60 capsule  0  . HYDROcodone-acetaminophen (NORCO/VICODIN) 5-325 MG per tablet Take 1-2 tablets by mouth every 4 (four) hours as needed.  120 tablet  5  . Multiple Vitamins-Minerals (THEREMS-M) TABS Take 1 tablet by mouth daily.      Marland Kitchen omeprazole (PRILOSEC) 20 MG capsule Take 20 mg by mouth daily.      . ondansetron (ZOFRAN) 4 MG tablet Take 1 tablet (4 mg total) by mouth every 8 (eight) hours as needed for nausea.  20 tablet  0  . polyethylene glycol (MIRALAX / GLYCOLAX) packet Take 17 g by mouth daily.  14 each  0  . rosuvastatin (CRESTOR) 10 MG tablet Take 10 mg by mouth at bedtime.      Marland Kitchen tiotropium (SPIRIVA) 18 MCG inhalation capsule Place 18 mcg into inhaler and inhale daily.      . traMADol (ULTRAM) 50 MG tablet Take 50 mg by mouth every morning.       . traZODone (DESYREL) 50 MG tablet Take 1 tablet (50 mg total) by mouth at bedtime as needed for sleep (insomnia).  30 tablet  12  . albuterol (PROVENTIL) (5 MG/ML) 0.5% nebulizer solution Take 0.5 mLs (2.5 mg total) by nebulization every 4 (four) hours as needed for wheezing.  20 mL  12  . ipratropium (ATROVENT) 0.02 % nebulizer solution Take 2.5 mLs (0.5 mg total) by nebulization every 4 (four) hours as needed for wheezing.  75 mL  12  . nebivolol (BYSTOLIC) 5 MG tablet Take 5 mg by mouth daily.       No current facility-administered medications for this visit.   Review of Systems  Constitutional: Negative for chills and fever.  Eyes: Negative for loss of vision.  Respiratory: Negative for cough and wheezing.  Cardiovascular: Positive for dyspnea with exertion and leg swelling. Negative for chest pain, chest tightness, claudication, orthopnea and palpitations.  GI: Negative for blood in stool and vomiting.  GU: Negative for dysuria and hematuria.  Musculoskeletal: Negative for leg pain, joint pain and myalgias.  Skin: Negative for rash and wound.  Neurological:  Negative for dizziness and speech difficulty.  Hematologic: Negative for bruises/bleeds easily. Psychiatric: Negative for depressed mood.        Objective:  Objective  Filed Vitals:   06/07/14 1313  BP: 153/76  Pulse: 63  Resp: 16  Height: 5\' 4"  (1.626 m)  Weight: 120 lb (54.432 kg)   Body mass index is 20.59 kg/(m^2).  Physical Exam  Neck: Neck supple. No  JVD present. No thyromegaly present.  Cardiovascular: Normal rate and regular rhythm.  Exam reveals no friction rub.   No murmur heard. Pulmonary/Chest: Breath sounds normal. She has no wheezes. She has no rales.  Abdominal: Soft. Bowel sounds are normal. There is no tenderness.  Her aneurysm is palpable and nontender.  Musculoskeletal: She exhibits no edema.  Lymphadenopathy:    She has no cervical adenopathy.  Skin: No rash noted.   Data: I have reviewed her CT angiogram today. I my measurement, the aneurysm measures 5.8 cm in maximum diameter. The juxtarenal aorta is ectatic with some laminated thrombus. Based on these findings she may not be a candidate for endovascular aneurysm repair. In addition it was noted that she has a penetrating atherosclerotic also involving the anterolateral aspect of the aorta between the takeoff of the celiac and SMA.      Assessment/Plan:    AAA (abdominal aortic aneurysm) without rupture Her aneurysm by my measurement is 5.8 cm in maximum diameter. This has been enlarging very slowly. I've explained that in a normal risk patient we would normally consider elective repair of the aneurysm at 5.5 cm. Clearly she is not normal risk given her age and debilitated state. In addition she has ectasia of the juxtarenal aorta with some laminated thrombus and also some ulcerated disease in her suprarenal aorta. I do not think she would be a good candidate for open repair. If she were to be considered for endovascular repair she may need to be considered for a fenestrated graft and I have discussed  potentially sending her to [facility] for evaluation by Dr. Sammuel Hines. She does not want to consider elective repair at this point which is reasonable I believe. I've arrange for a CT scan with 3 mm cuts in 6 months and I'll see her back at that time. She knows to call sooner she has problems.   Angelia Mould MD [facility]

## 2014-06-07 NOTE — Assessment & Plan Note (Signed)
Her aneurysm by my measurement is 5.8 cm in maximum diameter. This has been enlarging very slowly. I've explained that in a normal risk patient we would normally consider elective repair of the aneurysm at 5.5 cm. Clearly she is not normal risk given her age and debilitated state. In addition she has ectasia of the juxtarenal aorta with some laminated thrombus and also some ulcerated disease in her suprarenal aorta. I do not think she would be a good candidate for open repair. If she were to be considered for endovascular repair she may need to be considered for a fenestrated graft and I have discussed potentially sending her to [facility] for evaluation by Dr. Sammuel Hines. She does not want to consider elective repair at this point which is reasonable I believe. I've arrange for a CT scan with 3 mm cuts in 6 months and I'll see her back at that time. She knows to call sooner she has problems.

## 2014-07-20 ENCOUNTER — Emergency Department (HOSPITAL_COMMUNITY)
Admission: EM | Admit: 2014-07-20 | Discharge: 2014-07-21 | Disposition: A | Discharge status: Home / Self Care | Payer: Medicare Other | Attending: Emergency Medicine | Admitting: Emergency Medicine

## 2014-07-20 ENCOUNTER — Encounter (HOSPITAL_COMMUNITY): Payer: Self-pay | Admitting: Emergency Medicine

## 2014-07-20 DIAGNOSIS — F0390 Unspecified dementia without behavioral disturbance: Secondary | ICD-10-CM | POA: Diagnosis not present

## 2014-07-20 DIAGNOSIS — N390 Urinary tract infection, site not specified: Secondary | ICD-10-CM | POA: Diagnosis not present

## 2014-07-20 DIAGNOSIS — Z87891 Personal history of nicotine dependence: Secondary | ICD-10-CM | POA: Insufficient documentation

## 2014-07-20 DIAGNOSIS — Z862 Personal history of diseases of the blood and blood-forming organs and certain disorders involving the immune mechanism: Secondary | ICD-10-CM | POA: Insufficient documentation

## 2014-07-20 DIAGNOSIS — M1610 Unilateral primary osteoarthritis, unspecified hip: Secondary | ICD-10-CM | POA: Insufficient documentation

## 2014-07-20 DIAGNOSIS — F341 Dysthymic disorder: Secondary | ICD-10-CM | POA: Insufficient documentation

## 2014-07-20 DIAGNOSIS — R4182 Altered mental status, unspecified: Secondary | ICD-10-CM | POA: Insufficient documentation

## 2014-07-20 DIAGNOSIS — Z87448 Personal history of other diseases of urinary system: Secondary | ICD-10-CM | POA: Diagnosis not present

## 2014-07-20 DIAGNOSIS — Z8639 Personal history of other endocrine, nutritional and metabolic disease: Secondary | ICD-10-CM | POA: Diagnosis not present

## 2014-07-20 DIAGNOSIS — Z79899 Other long term (current) drug therapy: Secondary | ICD-10-CM | POA: Diagnosis not present

## 2014-07-20 DIAGNOSIS — J4489 Other specified chronic obstructive pulmonary disease: Secondary | ICD-10-CM | POA: Diagnosis not present

## 2014-07-20 DIAGNOSIS — K219 Gastro-esophageal reflux disease without esophagitis: Secondary | ICD-10-CM | POA: Diagnosis not present

## 2014-07-20 DIAGNOSIS — I10 Essential (primary) hypertension: Secondary | ICD-10-CM | POA: Insufficient documentation

## 2014-07-20 DIAGNOSIS — I4891 Unspecified atrial fibrillation: Secondary | ICD-10-CM | POA: Insufficient documentation

## 2014-07-20 DIAGNOSIS — E785 Hyperlipidemia, unspecified: Secondary | ICD-10-CM | POA: Diagnosis not present

## 2014-07-20 DIAGNOSIS — I739 Peripheral vascular disease, unspecified: Secondary | ICD-10-CM | POA: Diagnosis not present

## 2014-07-20 DIAGNOSIS — I2510 Atherosclerotic heart disease of native coronary artery without angina pectoris: Secondary | ICD-10-CM | POA: Diagnosis not present

## 2014-07-20 DIAGNOSIS — J449 Chronic obstructive pulmonary disease, unspecified: Secondary | ICD-10-CM | POA: Insufficient documentation

## 2014-07-20 DIAGNOSIS — Z87442 Personal history of urinary calculi: Secondary | ICD-10-CM | POA: Insufficient documentation

## 2014-07-20 HISTORY — DX: Unspecified dementia, unspecified severity, without behavioral disturbance, psychotic disturbance, mood disturbance, and anxiety: F03.90

## 2014-07-20 NOTE — ED Notes (Addendum)
Pt brought from  [facility] home.  Caregiver says "strong odor of urine and combative.".  Alert,  Says she "hurts all over".  Pt cooperative at triage

## 2014-07-20 NOTE — ED Provider Notes (Signed)
CSN: [id number]     Arrival date & time 07/20/14  2223 History  This chart was scribed for Sharyon Cable, MD by Peyton Bottoms, ED Scribe. This patient was seen in room APA02/APA02 and the patient's care was started at 11:19 PM.   Chief Complaint  Patient presents with  . Altered Mental Status   Patient is a 79 y.o. female presenting with altered mental status. The history is provided by the patient and a caregiver. No language interpreter was used.  Altered Mental Status Presenting symptoms: combativeness and disorientation   Severity:  Moderate Most recent episode:  Today Duration: today. Progression:  Resolved (now back to patient's baseline) Context: dementia and nursing home resident   Associated symptoms comment:  Strong-smelling urine   Level 5 Caveat: Dementia  HPI Comments: [name] [name] is a 79 y.o. female with a prior history of dementia and degenerative joint disease, brought in from [place] home who presents to the Emergency Department complaining of combative behavior.  Caregiver states that patient's urine had a strong odor to it.  Patient states she has bilateral pain in ankles, knees and hips which is not new for her.  Per caregiver, patient is not able to walk on her own.  Caregiver denies any recent falls or injuries. Caregiver reports that patient is at her baseline currently Past Medical History  Diagnosis Date  . Hypertension   . Hyperlipidemia   . COPD (chronic obstructive pulmonary disease)     multiple hospitalizations for exacerbations with acute bronchitis  . Degenerative joint disease     Bilateral hips  . Depression with anxiety   . GERD (gastroesophageal reflux disease)     Hiatal hernia  . Atrial arrhythmia     Atrial fibrillation and atrial flutter diagnosed in the past;?  PSVT  . AAA (abdominal aortic aneurysm)     infrarenal; 07/2012: Diameter of 5.4 cm  . Arteriosclerotic cardiovascular disease (ASCVD)     Cardiac cath in  03/2000->  80% LAD, 70-80% RCA;  stent x1 in the LAD and x2 in the RCA with excellent angiographic outcome; repeat catheterization in late 2001 and 2003 revealed no restenosis; EF-50%  . Tobacco abuse, in remission     Quit in 2006; total consumption of 50-75 pack years  . Pituitary microadenoma     transsphenoidal resection in 09/2007  . Anemia   . Coronary artery disease   . PVD (peripheral vascular disease)   . Gall stones   . Hypokalemia   . Ileus   . SBO (small bowel obstruction)   . Hypernatremia   . Nephrolithiasis   . Renal failure   . DJD (degenerative joint disease)   . Encephalopathy   . Hyperlipidemia   . Dementia   . AAA (abdominal aortic aneurysm)    Past Surgical History  Procedure Laterality Date  . Kidney stone surgery    . Transphenoidal / transnasal hypophysectomy / resection pituitary tumor  09/2007  . Cardiac catheterization  2001    post PTCA and stenting of her LAD and RCA  . Appendectomy N/A 05/25/2013    Procedure: APPENDECTOMY;  Surgeon: Donato Heinz, MD;  Location: [facility];  Service: General;  Laterality: N/A;   Family History  Problem Relation Age of Onset  . Heart disease Mother   . Hyperlipidemia Mother   . Hypertension Mother   . Stroke Mother   . Cancer Brother    History  Substance Use Topics  . Smoking status:  Former Smoker -- 1.50 packs/day for 50 years    Types: Cigarettes    Quit date: 05/27/2005  . Smokeless tobacco: Former Systems developer    Quit date: 01/11/2008  . Alcohol Use: No   OB History   Grav Para Term Preterm Abortions TAB SAB Ect Mult Living                 Review of Systems  Unable to perform ROS: Dementia    Allergies  Zoloft; Asa; Lidocaine; Nsaids; Sulfonamide derivatives; and Ativan  Home Medications   Prior to Admission medications   Medication Sig Start Date End Date Taking? Authorizing Provider  acetaminophen (TYLENOL) 325 MG tablet Take 2 tablets (650 mg total) by mouth every 4 (four) hours as needed.  05/31/13  Yes Alonza Bogus, MD  acidophilus (RISAQUAD) CAPS Take 2 capsules by mouth daily. 05/31/13  Yes Alonza Bogus, MD  ALPRAZolam Duanne Moron) 0.5 MG tablet Take 1 tablet (0.5 mg total) by mouth every 4 (four) hours as needed for anxiety. 05/31/13  Yes Alonza Bogus, MD  divalproex (DEPAKOTE SPRINKLE) 125 MG capsule Take 125-250 mg by mouth 2 (two) times daily. Take two capsules in the morning and take one capsule at bedtime   Yes Historical Provider, MD  furosemide (LASIX) 40 MG tablet Take 40 mg by mouth every morning.   Yes Historical Provider, MD  HYDROcodone-acetaminophen (NORCO/VICODIN) 5-325 MG per tablet Take 1-2 tablets by mouth every 6 (six) hours as needed for moderate pain or severe pain. 05/31/13  Yes Alonza Bogus, MD  Multiple Vitamins-Minerals (THEREMS-M) TABS Take 1 tablet by mouth daily.   Yes Historical Provider, MD  nebivolol (BYSTOLIC) 5 MG tablet Take 5 mg by mouth daily.   Yes Historical Provider, MD  omeprazole (PRILOSEC) 20 MG capsule Take 20 mg by mouth daily.   Yes Historical Provider, MD  ondansetron (ZOFRAN) 4 MG tablet Take 1 tablet (4 mg total) by mouth every 8 (eight) hours as needed for nausea. 05/31/13  Yes Alonza Bogus, MD  polyethylene glycol [facility] / GLYCOLAX) packet Take 17 g by mouth daily. 05/31/13  Yes Alonza Bogus, MD  rosuvastatin (CRESTOR) 10 MG tablet Take 10 mg by mouth at bedtime.   Yes Historical Provider, MD  tiotropium (SPIRIVA) 18 MCG inhalation capsule Place 18 mcg into inhaler and inhale daily.   Yes Historical Provider, MD  traMADol (ULTRAM) 50 MG tablet Take 50 mg by mouth every morning.    Yes Historical Provider, MD  traZODone (DESYREL) 50 MG tablet Take 1 tablet (50 mg total) by mouth at bedtime as needed for sleep (insomnia). 05/31/13  Yes Alonza Bogus, MD  albuterol (PROVENTIL) (5 MG/ML) 0.5% nebulizer solution Take 0.5 mLs (2.5 mg total) by nebulization every 4 (four) hours as needed for wheezing. 05/31/13   Alonza Bogus, MD  ipratropium (ATROVENT) 0.02 % nebulizer solution Take 2.5 mLs (0.5 mg total) by nebulization every 4 (four) hours as needed for wheezing. 05/31/13   Alonza Bogus, MD   Triage Vitals: BP 123/50  Pulse 63  Temp(Src) 98.2 F (36.8 C) (Oral)  Resp 16  Ht 5\' 6"  (1.676 m)  Wt 130 lb (58.968 kg)  BMI 20.99 kg/m2  SpO2 96% Physical Exam  Nursing note and vitals reviewed. CONSTITUTIONAL: elderly, no distress HEAD: Normocephalic/atraumatic EYES: EOMI/PERRL ENMT: Mucous membranes moist NECK: supple no meningeal signs SPINE:entire spine nontender CV: S1/S2 noted, no murmurs/rubs/gallops noted LUNGS: Lungs are clear to auscultation bilaterally, no  apparent distress ABDOMEN: soft, nontender, no rebound or guarding GU:no cva tenderness NEURO: Pt is awake/alert, moves all extremitiesx4, pleasantly confused EXTREMITIES: pulses normal, full ROM, no deformity to lower extremities and pelvis stable SKIN: warm, color normal PSYCH: no abnormalities of mood noted  ED Course  Procedures  DIAGNOSTIC STUDIES: Oxygen Saturation is 96% on RA, normal by my interpretation.    COORDINATION OF CARE: 11:25 PM- Discussed plan to order diagnostic lab work. Pt advised of plan for treatment and pt agrees. Labs ordered She is apparently at her baseline.  Will screen for UTI 12:54 AM UTI noted Pt in no distress She is not septic appearing Stable for d/c home She is not combative here in the ED  Labs Review Labs Reviewed  URINALYSIS, ROUTINE W REFLEX MICROSCOPIC - Abnormal; Notable for the following:    APPearance HAZY (*)    Hgb urine dipstick SMALL (*)    Bilirubin Urine SMALL (*)    Ketones, ur TRACE (*)    Protein, ur TRACE (*)    Leukocytes, UA MODERATE (*)    All other components within normal limits  BASIC METABOLIC PANEL - Abnormal; Notable for the following:    Creatinine, Ser 1.75 (*)    GFR calc non Af Amer 27 (*)    GFR calc Af Amer 31 (*)    All other components within  normal limits  CBC WITH DIFFERENTIAL - Abnormal; Notable for the following:    Platelets 115 (*)    Neutrophils Relative % 42 (*)    All other components within normal limits  URINE MICROSCOPIC-ADD ON - Abnormal; Notable for the following:    Bacteria, UA MANY (*)    All other components within normal limits  URINE CULTURE      MDM   Final diagnoses:  UTI (lower urinary tract infection)    Nursing notes including past medical history and social history reviewed and considered in documentation  Labs/vital reviewed and considered   I personally performed the services described in this documentation, which was scribed in my presence. The recorded information has been reviewed and is accurate.      Sharyon Cable, MD 07/21/14 [phone]

## 2014-07-21 DIAGNOSIS — N390 Urinary tract infection, site not specified: Secondary | ICD-10-CM | POA: Diagnosis not present

## 2014-07-21 LAB — URINE MICROSCOPIC-ADD ON

## 2014-07-21 LAB — BASIC METABOLIC PANEL
Anion gap: 12 (ref 5–15)
BUN: 23 mg/dL (ref 6–23)
CO2: 30 mEq/L (ref 19–32)
Calcium: 9.2 mg/dL (ref 8.4–10.5)
Chloride: 99 mEq/L (ref 96–112)
Creatinine, Ser: 1.75 mg/dL — ABNORMAL HIGH (ref 0.50–1.10)
GFR calc non Af Amer: 27 mL/min — ABNORMAL LOW (ref >90)
GFR, EST AFRICAN AMERICAN: 31 mL/min — AB (ref >90)
GLUCOSE: 97 mg/dL (ref 70–99)
POTASSIUM: 3.7 meq/L (ref 3.7–5.3)
SODIUM: 141 meq/L (ref 137–147)

## 2014-07-21 LAB — URINALYSIS, ROUTINE W REFLEX MICROSCOPIC
Glucose, UA: NEGATIVE mg/dL
Nitrite: NEGATIVE
PH: 5.5 (ref 5.0–8.0)
SPECIFIC GRAVITY, URINE: 1.02 (ref 1.005–1.030)
Urobilinogen, UA: 1 mg/dL (ref 0.0–1.0)

## 2014-07-21 LAB — CBC WITH DIFFERENTIAL/PLATELET
Basophils Absolute: 0 10*3/uL (ref 0.0–0.1)
Basophils Relative: 0 % (ref 0–1)
EOS PCT: 1 % (ref 0–5)
Eosinophils Absolute: 0.1 10*3/uL (ref 0.0–0.7)
HCT: 37.1 % (ref 36.0–46.0)
Hemoglobin: 12.1 g/dL (ref 12.0–15.0)
Lymphocytes Relative: 46 % (ref 12–46)
Lymphs Abs: 2.9 10*3/uL (ref 0.7–4.0)
MCH: 30.6 pg (ref 26.0–34.0)
MCHC: 32.6 g/dL (ref 30.0–36.0)
MCV: 93.9 fL (ref 78.0–100.0)
MONOS PCT: 11 % (ref 3–12)
Monocytes Absolute: 0.7 10*3/uL (ref 0.1–1.0)
NEUTROS PCT: 42 % — AB (ref 43–77)
Neutro Abs: 2.7 10*3/uL (ref 1.7–7.7)
PLATELETS: 115 10*3/uL — AB (ref 150–400)
RBC: 3.95 MIL/uL (ref 3.87–5.11)
RDW: 14.5 % (ref 11.5–15.5)
WBC: 6.4 10*3/uL (ref 4.0–10.5)

## 2014-07-21 MED ORDER — CEPHALEXIN 500 MG PO CAPS
500.0000 mg | ORAL_CAPSULE | Freq: Once | ORAL | Status: AC
  Administered 2014-07-21: 500 mg via ORAL
  Filled 2014-07-21: qty 1

## 2014-07-21 MED ORDER — CEPHALEXIN 500 MG PO CAPS: 500.0000 mg | ORAL_CAPSULE | Freq: Three times a day (TID) | ORAL | Status: DC

## 2014-07-21 NOTE — ED Notes (Signed)
Two attempts have been made to contact [facility] with no answer. Dr. Christy Gentles notified.

## 2014-07-21 NOTE — ED Notes (Signed)
Pt is calm and cooperative. Staff member from [facility] at bedside states pt appears at baseline. Pt c/o aching all over. States "I feel terrible."

## 2014-07-23 LAB — URINE CULTURE: Colony Count: >=100000

## 2014-07-24 ENCOUNTER — Telehealth (HOSPITAL_COMMUNITY): Payer: Self-pay

## 2014-07-24 NOTE — ED Notes (Signed)
Post ED Visit - Positive Culture Follow-up  Culture report reviewed by antimicrobial stewardship pharmacist: []  Wes [place], Pharm.D., BCPS []  Heide Guile, Pharm.D., BCPS []  Alycia Rossetti, Pharm.D., BCPS []  [place], Pharm.D., BCPS, AAHIVP []  Legrand [place]o, Pharm.D., BCPS, AAHIVP []  Hassie Bruce, Pharm.D. []  Cassie [place], [place].D.  Positive urine culture Treated with cephalexin, organism sensitive to the same and no further patient follow-up is required at this time.  Ileene Musa 07/24/2014, 11:10 AM

## 2014-08-14 ENCOUNTER — Encounter: Payer: Self-pay | Admitting: Vascular Surgery

## 2014-08-23 DIAGNOSIS — J4489 Other specified chronic obstructive pulmonary disease: Secondary | ICD-10-CM | POA: Diagnosis not present

## 2014-08-23 DIAGNOSIS — Z79899 Other long term (current) drug therapy: Secondary | ICD-10-CM | POA: Diagnosis not present

## 2014-08-23 DIAGNOSIS — F29 Unspecified psychosis not due to a substance or known physiological condition: Secondary | ICD-10-CM | POA: Diagnosis not present

## 2014-08-23 DIAGNOSIS — F411 Generalized anxiety disorder: Secondary | ICD-10-CM | POA: Diagnosis not present

## 2014-08-23 DIAGNOSIS — G4751 Confusional arousals: Secondary | ICD-10-CM | POA: Diagnosis not present

## 2014-08-23 DIAGNOSIS — J449 Chronic obstructive pulmonary disease, unspecified: Secondary | ICD-10-CM | POA: Diagnosis not present

## 2014-09-14 ENCOUNTER — Ambulatory Visit (INDEPENDENT_AMBULATORY_CARE_PROVIDER_SITE_OTHER): Payer: Medicare Other | Admitting: Otolaryngology

## 2014-09-14 DIAGNOSIS — H903 Sensorineural hearing loss, bilateral: Secondary | ICD-10-CM | POA: Diagnosis not present

## 2014-09-14 DIAGNOSIS — H6120 Impacted cerumen, unspecified ear: Secondary | ICD-10-CM | POA: Diagnosis not present

## 2014-10-05 DIAGNOSIS — Z23 Encounter for immunization: Secondary | ICD-10-CM | POA: Diagnosis not present

## 2014-12-05 ENCOUNTER — Encounter: Payer: Self-pay | Admitting: Vascular Surgery

## 2014-12-06 ENCOUNTER — Inpatient Hospital Stay: Admission: RE | Admit: 2014-12-06 | Payer: Medicare Other | Source: Ambulatory Visit

## 2014-12-06 ENCOUNTER — Ambulatory Visit: Payer: Medicare Other | Admitting: Vascular Surgery

## 2014-12-20 ENCOUNTER — Other Ambulatory Visit: Payer: Medicare Other

## 2014-12-28 ENCOUNTER — Other Ambulatory Visit: Payer: Medicare Other

## 2015-01-09 ENCOUNTER — Other Ambulatory Visit: Payer: Self-pay | Admitting: *Deleted

## 2015-01-09 ENCOUNTER — Encounter: Payer: Self-pay | Admitting: Vascular Surgery

## 2015-01-09 DIAGNOSIS — Z01812 Encounter for preprocedural laboratory examination: Secondary | ICD-10-CM

## 2015-01-10 ENCOUNTER — Inpatient Hospital Stay: Admission: RE | Admit: 2015-01-10 | Payer: Medicare Other | Source: Ambulatory Visit

## 2015-01-10 ENCOUNTER — Ambulatory Visit: Payer: Medicare Other | Admitting: Vascular Surgery

## 2015-01-25 DIAGNOSIS — I2510 Atherosclerotic heart disease of native coronary artery without angina pectoris: Secondary | ICD-10-CM | POA: Diagnosis not present

## 2015-01-25 DIAGNOSIS — I714 Abdominal aortic aneurysm, without rupture: Secondary | ICD-10-CM | POA: Diagnosis not present

## 2015-01-25 DIAGNOSIS — J449 Chronic obstructive pulmonary disease, unspecified: Secondary | ICD-10-CM | POA: Diagnosis not present

## 2015-01-25 DIAGNOSIS — I10 Essential (primary) hypertension: Secondary | ICD-10-CM | POA: Diagnosis not present

## 2015-01-29 ENCOUNTER — Encounter: Payer: Self-pay | Admitting: Vascular Surgery

## 2015-01-29 DIAGNOSIS — E785 Hyperlipidemia, unspecified: Secondary | ICD-10-CM | POA: Diagnosis not present

## 2015-01-29 DIAGNOSIS — F17200 Nicotine dependence, unspecified, uncomplicated: Secondary | ICD-10-CM | POA: Diagnosis not present

## 2015-01-29 DIAGNOSIS — J449 Chronic obstructive pulmonary disease, unspecified: Secondary | ICD-10-CM | POA: Diagnosis not present

## 2015-01-29 DIAGNOSIS — I714 Abdominal aortic aneurysm, without rupture: Secondary | ICD-10-CM | POA: Diagnosis not present

## 2015-01-29 DIAGNOSIS — I2510 Atherosclerotic heart disease of native coronary artery without angina pectoris: Secondary | ICD-10-CM | POA: Diagnosis not present

## 2015-01-29 DIAGNOSIS — I10 Essential (primary) hypertension: Secondary | ICD-10-CM | POA: Diagnosis not present

## 2015-01-31 ENCOUNTER — Inpatient Hospital Stay: Admission: RE | Admit: 2015-01-31 | Payer: Medicare Other | Source: Ambulatory Visit

## 2015-01-31 ENCOUNTER — Ambulatory Visit: Payer: Medicare Other | Admitting: Vascular Surgery

## 2015-02-22 ENCOUNTER — Telehealth (HOSPITAL_COMMUNITY): Payer: Self-pay | Admitting: Cardiovascular Disease

## 2015-02-22 NOTE — Telephone Encounter (Signed)
Received records from Dr Sinda Du for appointment on 03/28/15 with Dr Claiborne Billings.  Records given to [facility] (medical records) for Dr Evette Georges schedule on 03/28/15. lp

## 2015-02-27 ENCOUNTER — Encounter: Payer: Self-pay | Admitting: Vascular Surgery

## 2015-02-28 ENCOUNTER — Emergency Department (HOSPITAL_COMMUNITY)
Admission: EM | Admit: 2015-02-28 | Discharge: 2015-02-28 | Disposition: A | Discharge status: Home / Self Care | Payer: Medicare Other | Attending: Emergency Medicine | Admitting: Emergency Medicine

## 2015-02-28 ENCOUNTER — Emergency Department (HOSPITAL_COMMUNITY): Payer: Medicare Other

## 2015-02-28 ENCOUNTER — Ambulatory Visit (HOSPITAL_COMMUNITY)
Admission: RE | Admit: 2015-02-28 | Discharge: 2015-02-28 | Disposition: A | Discharge status: Home / Self Care | Payer: Medicare Other | Source: Ambulatory Visit | Attending: Vascular Surgery | Admitting: Vascular Surgery

## 2015-02-28 ENCOUNTER — Encounter (HOSPITAL_COMMUNITY): Payer: Self-pay | Admitting: *Deleted

## 2015-02-28 ENCOUNTER — Ambulatory Visit: Payer: Medicare Other | Admitting: Vascular Surgery

## 2015-02-28 DIAGNOSIS — I10 Essential (primary) hypertension: Secondary | ICD-10-CM | POA: Diagnosis not present

## 2015-02-28 DIAGNOSIS — J449 Chronic obstructive pulmonary disease, unspecified: Secondary | ICD-10-CM | POA: Insufficient documentation

## 2015-02-28 DIAGNOSIS — S99922A Unspecified injury of left foot, initial encounter: Secondary | ICD-10-CM | POA: Diagnosis not present

## 2015-02-28 DIAGNOSIS — Z87442 Personal history of urinary calculi: Secondary | ICD-10-CM | POA: Insufficient documentation

## 2015-02-28 DIAGNOSIS — Z9889 Other specified postprocedural states: Secondary | ICD-10-CM | POA: Insufficient documentation

## 2015-02-28 DIAGNOSIS — S8002XA Contusion of left knee, initial encounter: Secondary | ICD-10-CM | POA: Diagnosis not present

## 2015-02-28 DIAGNOSIS — E785 Hyperlipidemia, unspecified: Secondary | ICD-10-CM | POA: Insufficient documentation

## 2015-02-28 DIAGNOSIS — Z862 Personal history of diseases of the blood and blood-forming organs and certain disorders involving the immune mechanism: Secondary | ICD-10-CM | POA: Insufficient documentation

## 2015-02-28 DIAGNOSIS — Y9289 Other specified places as the place of occurrence of the external cause: Secondary | ICD-10-CM | POA: Insufficient documentation

## 2015-02-28 DIAGNOSIS — Z79899 Other long term (current) drug therapy: Secondary | ICD-10-CM | POA: Insufficient documentation

## 2015-02-28 DIAGNOSIS — Y998 Other external cause status: Secondary | ICD-10-CM | POA: Insufficient documentation

## 2015-02-28 DIAGNOSIS — S90122A Contusion of left lesser toe(s) without damage to nail, initial encounter: Secondary | ICD-10-CM

## 2015-02-28 DIAGNOSIS — K219 Gastro-esophageal reflux disease without esophagitis: Secondary | ICD-10-CM | POA: Insufficient documentation

## 2015-02-28 DIAGNOSIS — F418 Other specified anxiety disorders: Secondary | ICD-10-CM | POA: Insufficient documentation

## 2015-02-28 DIAGNOSIS — I714 Abdominal aortic aneurysm, without rupture, unspecified: Secondary | ICD-10-CM

## 2015-02-28 DIAGNOSIS — F0390 Unspecified dementia without behavioral disturbance: Secondary | ICD-10-CM | POA: Diagnosis not present

## 2015-02-28 DIAGNOSIS — S90112A Contusion of left great toe without damage to nail, initial encounter: Secondary | ICD-10-CM | POA: Insufficient documentation

## 2015-02-28 DIAGNOSIS — M1990 Unspecified osteoarthritis, unspecified site: Secondary | ICD-10-CM | POA: Insufficient documentation

## 2015-02-28 DIAGNOSIS — S8992XA Unspecified injury of left lower leg, initial encounter: Secondary | ICD-10-CM | POA: Diagnosis present

## 2015-02-28 DIAGNOSIS — M25562 Pain in left knee: Secondary | ICD-10-CM | POA: Diagnosis not present

## 2015-02-28 DIAGNOSIS — W050XXA Fall from non-moving wheelchair, initial encounter: Secondary | ICD-10-CM | POA: Diagnosis not present

## 2015-02-28 DIAGNOSIS — Z87891 Personal history of nicotine dependence: Secondary | ICD-10-CM | POA: Diagnosis not present

## 2015-02-28 DIAGNOSIS — Y9389 Activity, other specified: Secondary | ICD-10-CM | POA: Insufficient documentation

## 2015-02-28 DIAGNOSIS — I701 Atherosclerosis of renal artery: Secondary | ICD-10-CM | POA: Diagnosis not present

## 2015-02-28 DIAGNOSIS — I2510 Atherosclerotic heart disease of native coronary artery without angina pectoris: Secondary | ICD-10-CM | POA: Diagnosis not present

## 2015-02-28 DIAGNOSIS — M79675 Pain in left toe(s): Secondary | ICD-10-CM | POA: Diagnosis not present

## 2015-02-28 DIAGNOSIS — I708 Atherosclerosis of other arteries: Secondary | ICD-10-CM | POA: Diagnosis not present

## 2015-02-28 LAB — POCT I-STAT CREATININE: Creatinine, Ser: 1.2 mg/dL — ABNORMAL HIGH (ref 0.50–1.10)

## 2015-02-28 MED ORDER — HYDROCODONE-ACETAMINOPHEN 5-325 MG PO TABS: 1.0000 | ORAL_TABLET | Freq: Once | ORAL | Status: DC

## 2015-02-28 MED ORDER — IOHEXOL 350 MG/ML SOLN
100.0000 mL | Freq: Once | INTRAVENOUS | Status: AC | PRN
  Administered 2015-02-28: 100 mL via INTRAVENOUS

## 2015-02-28 MED ORDER — TRAMADOL HCL 50 MG PO TABS: 50.0000 mg | ORAL_TABLET | Freq: Once | ORAL | Status: DC
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
## 2015-02-28 NOTE — Discharge Instructions (Signed)
Contusion °A contusion is a deep bruise. Contusions are the result of an injury that caused bleeding under the skin. The contusion may turn blue, purple, or yellow. Minor injuries will give you a painless contusion, but more severe contusions may stay painful and swollen for a few weeks.  °CAUSES  °A contusion is usually caused by a blow, trauma, or direct force to an area of the body. °SYMPTOMS  °· Swelling and redness of the injured area. °· Bruising of the injured area. °· Tenderness and soreness of the injured area. °· Pain. °DIAGNOSIS  °The diagnosis can be made by taking a history and physical exam. An X-ray, CT scan, or MRI may be needed to determine if there were any associated injuries, such as fractures. °TREATMENT  °Specific treatment will depend on what area of the body was injured. In general, the best treatment for a contusion is resting, icing, elevating, and applying cold compresses to the injured area. Over-the-counter medicines may also be recommended for pain control. Ask your caregiver what the best treatment is for your contusion. °HOME CARE INSTRUCTIONS  °· Put ice on the injured area. °¨ Put ice in a plastic bag. °¨ Place a towel between your skin and the bag. °¨ Leave the ice on for 15-20 minutes, 3-4 times a day, or as directed by your health care provider. °· Only take over-the-counter or prescription medicines for pain, discomfort, or fever as directed by your caregiver. Your caregiver may recommend avoiding anti-inflammatory medicines (aspirin, ibuprofen, and naproxen) for 48 hours because these medicines may increase bruising. °· Rest the injured area. °· If possible, elevate the injured area to reduce swelling. °SEEK IMMEDIATE MEDICAL CARE IF:  °· You have increased bruising or swelling. °· You have pain that is getting worse. °· Your swelling or pain is not relieved with medicines. °MAKE SURE YOU:  °· Understand these instructions. °· Will watch your condition. °· Will get help right  away if you are not doing well or get worse. °Document Released: 09/17/2005 Document Revised: 12/13/2013 Document Reviewed: 10/13/2011 °[employer]® Patient Information ©2015 [employer]. This information is not intended to replace advice given to you by your health care provider. Make sure you discuss any questions you have with your health care provider. ° °

## 2015-02-28 NOTE — ED Notes (Signed)
Pt & family left with out receiving paperwork, pt & family verbalized understanding of follow up care prior to discharge
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
## 2015-02-28 NOTE — ED Notes (Addendum)
Pt in today to radiology for x ray of aorta, pt reports falling yesterday from wheelchair onto floor, denies hitting her head & LOC, pt has swelling to lateral left leg below the knee, +PS, decreased movement d/t pain, no contusion to knee of leg, pt has contusion to L big toe, pt A&O x3, disoriented to time, unable to state age, follows commands, speaks in complete sentences, L leg shortening noted with reported hx of deteriorating joint disease in L hip

## 2015-03-28 ENCOUNTER — Ambulatory Visit: Payer: Medicare Other | Admitting: Cardiovascular Disease

## 2015-05-31 ENCOUNTER — Encounter: Payer: Self-pay | Admitting: Cardiovascular Disease

## 2015-05-31 ENCOUNTER — Ambulatory Visit (INDEPENDENT_AMBULATORY_CARE_PROVIDER_SITE_OTHER): Payer: Medicare Other | Admitting: Cardiovascular Disease

## 2015-05-31 VITALS — BP 104/80 | HR 54 | Ht 64.0 in | Wt 136.0 lb

## 2015-05-31 DIAGNOSIS — I2510 Atherosclerotic heart disease of native coronary artery without angina pectoris: Secondary | ICD-10-CM

## 2015-05-31 DIAGNOSIS — Z79899 Other long term (current) drug therapy: Secondary | ICD-10-CM

## 2015-05-31 DIAGNOSIS — I714 Abdominal aortic aneurysm, without rupture, unspecified: Secondary | ICD-10-CM

## 2015-05-31 DIAGNOSIS — R0789 Other chest pain: Secondary | ICD-10-CM

## 2015-05-31 DIAGNOSIS — J449 Chronic obstructive pulmonary disease, unspecified: Secondary | ICD-10-CM

## 2015-05-31 DIAGNOSIS — I498 Other specified cardiac arrhythmias: Secondary | ICD-10-CM

## 2015-05-31 DIAGNOSIS — I499 Cardiac arrhythmia, unspecified: Secondary | ICD-10-CM

## 2015-05-31 NOTE — Progress Notes (Signed)
Patient ID: [name] M Rhodus, female   DOB: [birth year], 80 y.o.   MRN: [id number]     HPI: [name] [name] is a 80 y.o. female who presents to the office today for a 3 year follow up cardiology evaluation.  Ms. [name] [name] is a 80 year old female who I had last seen in 2013.  She currently is residing at a rest home and eating.  She has a history of known CAD, as well as PVD.  In 2001, she underwent PTCA/stenting of her LAD and RCA.  She had developed progressive abdominal aortic aneurysm and has been followed by Dr. Doren Custard.  She tells me she last saw him one year ago.  She recently underwent a follow-up CT scan in March 2016 which demonstrated  progression of her abdominal aortic aneurysm which now measures 6.05.9 which is slightly enlarged from 5.85.9 in June 2015.  She also has advanced atherosclerotic vascular disease with heterogeneous fibrofatty atherosclerotic plaque and multiple teeth focal penetrating aortic ulcers.  Her complete report is noted below.  She also has a history of hypertension, COPD, right bundle branch block.  An echo Doppler study in 2013 showed an EF of 60-65% with grade 1 diastolic dysfunction.  There was aortic valve sclerosis and mitral annular calcification.  Presently, she denies episodes of chest pain.  She is in a wheelchair and does not walk due to hip discomfort.  She has issues with scoliosis in her back as well as her left hip.  She has not seen Dr. Scot Dock in follow-up of her March CT scan.   She is unaware of palpitations.  She denies presyncope or syncope.  She denies abdominal pain.   Past Medical History  Diagnosis Date  . Hypertension   . Hyperlipidemia   . COPD (chronic obstructive pulmonary disease)     multiple hospitalizations for exacerbations with acute bronchitis  . Degenerative joint disease     Bilateral hips  . Depression with anxiety   . GERD (gastroesophageal reflux disease)     Hiatal hernia  . Atrial arrhythmia     Atrial  fibrillation and atrial flutter diagnosed in the past;?  PSVT  . AAA (abdominal aortic aneurysm)     infrarenal; 07/2012: Diameter of 5.4 cm  . Arteriosclerotic cardiovascular disease (ASCVD)     Cardiac cath in 03/2000->  80% LAD, 70-80% RCA;  stent x1 in the LAD and x2 in the RCA with excellent angiographic outcome; repeat catheterization in late 2001 and 2003 revealed no restenosis; EF-50%  . Tobacco abuse, in remission     Quit in 2006; total consumption of 50-75 pack years  . Pituitary microadenoma     transsphenoidal resection in 09/2007  . Anemia   . Coronary artery disease   . PVD (peripheral vascular disease)   . Gall stones   . Hypokalemia   . Ileus   . SBO (small bowel obstruction)   . Hypernatremia   . Nephrolithiasis   . Renal failure   . DJD (degenerative joint disease)   . Encephalopathy   . Hyperlipidemia   . Dementia   . AAA (abdominal aortic aneurysm)     Past Surgical History  Procedure Laterality Date  . Kidney stone surgery    . Transphenoidal / transnasal hypophysectomy / resection pituitary tumor  09/2007  . Cardiac catheterization  2001    post PTCA and stenting of her LAD and RCA  . Appendectomy N/A 05/25/2013    Procedure: APPENDECTOMY;  Surgeon: Donato Heinz,  MD;  Location: [facility];  Service: General;  Laterality: N/A;    Allergies  Allergen Reactions  . Zoloft [Sertraline Hcl] Anxiety  . Asa [Aspirin] Other (See Comments)    "my ears bleed"  . Lidocaine Other (See Comments)    unknown  . Nsaids Other (See Comments)    unknown  . Sulfonamide Derivatives Other (See Comments)    unknown  . Ativan [Lorazepam] Anxiety    Per daughter    Current Outpatient Prescriptions  Medication Sig Dispense Refill  . acetaminophen (TYLENOL) 325 MG tablet Take 2 tablets (650 mg total) by mouth every 4 (four) hours as needed. 60 tablet 12  . acidophilus (RISAQUAD) CAPS Take 2 capsules by mouth daily. 60 capsule 5  . albuterol (PROVENTIL) (5 MG/ML) 0.5%  nebulizer solution Take 0.5 mLs (2.5 mg total) by nebulization every 4 (four) hours as needed for wheezing. 20 mL 12  . ALPRAZolam (XANAX) 0.5 MG tablet Take 1 tablet (0.5 mg total) by mouth every 4 (four) hours as needed for anxiety. 120 tablet 2  . divalproex (DEPAKOTE SPRINKLE) 125 MG capsule Take 125-250 mg by mouth 2 (two) times daily. Take two capsules in the morning and take one capsule at bedtime    . furosemide (LASIX) 40 MG tablet Take 40 mg by mouth every morning.    Marland Kitchen HYDROcodone-acetaminophen (NORCO/VICODIN) 5-325 MG per tablet Take 1-2 tablets by mouth every 6 (six) hours as needed for moderate pain or severe pain.    . INCRUSE ELLIPTA 62.5 MCG/INH AEPB     . ipratropium (ATROVENT) 0.02 % nebulizer solution Take 2.5 mLs (0.5 mg total) by nebulization every 4 (four) hours as needed for wheezing. (Patient taking differently: Take 0.5 mg by nebulization daily. ) 75 mL 12  . multivitamin-iron-minerals-folic acid (THERAPEUTIC-M) TABS tablet Take 1 tablet by mouth daily.    . nebivolol (BYSTOLIC) 5 MG tablet Take 5 mg by mouth daily.    Marland Kitchen omeprazole (PRILOSEC) 20 MG capsule Take 20 mg by mouth daily.    . polyethylene glycol (MIRALAX / GLYCOLAX) packet Take 17 g by mouth daily. 14 each 0  . rosuvastatin (CRESTOR) 10 MG tablet Take 10 mg by mouth at bedtime.    Marland Kitchen tiotropium (SPIRIVA) 18 MCG inhalation capsule Place 18 mcg into inhaler and inhale daily.    . traMADol (ULTRAM) 50 MG tablet Take 50 mg by mouth every morning.     . traZODone (DESYREL) 50 MG tablet Take 1 tablet (50 mg total) by mouth at bedtime as needed for sleep (insomnia). 30 tablet 12   No current facility-administered medications for this visit.    History   Social History  . Marital Status: Widowed    Spouse Name: N/A  . Number of Children: N/A  . Years of Education: N/A   Occupational History  . Not on file.   Social History Main Topics  . Smoking status: Former Smoker -- 1.50 packs/day for 50 years     Types: Cigarettes    Quit date: 05/27/2005  . Smokeless tobacco: Former Systems developer    Types: Snuff    Quit date: 01/11/2008  . Alcohol Use: No  . Drug Use: No  . Sexual Activity: Not Currently   Other Topics Concern  . Not on file   Social History Narrative   Social history is notable that she lives at [facility] retirement home,  in [place].  Family History  Problem Relation Age of Onset  . Heart disease Mother   .  Hyperlipidemia Mother   . Hypertension Mother   . Stroke Mother   . Cancer Brother    Family history is notable that her mother died with a CVA.  Father died from emphysema.  One brother died with cancer.  Another brother died at age 69 to a heart ailment.  ROS General: Negative; No fevers, chills, or night sweats HEENT: Negative; No changes in vision or hearing, sinus congestion, difficulty swallowing Pulmonary: Positive for COPD Cardiovascular: See HPI: No chest pain, presyncope, syncope, palpatations GI: Negative; No nausea, vomiting, diarrhea, or abdominal pain GU: Negative; No dysuria, hematuria, or difficulty voiding Musculoskeletal: Positive for back and left hip discomfort. Hematologic: Negative; no easy bruising, bleeding Endocrine: Negative; no heat/cold intolerance; no diabetes, Neuro: Negative; no changes in balance, headaches Skin: Negative; No rashes or skin lesions Psychiatric: Negative; No behavioral problems, depression Sleep: Negative; No snoring,  daytime sleepiness, hypersomnolence, bruxism, restless legs, hypnogognic hallucinations. Other comprehensive 14 point system review is negative   Physical Exam BP 104/80 mmHg  Pulse 54  Ht 5' 4"  (1.626 m)  Wt 136 lb (61.689 kg)  BMI 23.33 kg/m2  Repeat blood pressure by me was 126/78. Wt Readings from Last 3 Encounters:  05/31/15 136 lb (61.689 kg)  07/20/14 130 lb (58.968 kg)  06/07/14 120 lb (54.432 kg)   General: Alert, oriented, no distress.  Skin: normal turgor, no rashes, warm and  dry HEENT: Normocephalic, atraumatic. Pupils equal round and reactive to light; sclera anicteric; extraocular muscles intact, No lid lag; Nose without nasal septal hypertrophy; Mouth/Parynx benign; Mallinpatti scale 3 Neck: No JVD, no carotid bruits; normal carotid upstroke Lungs: clear to ausculatation and percussion bilaterally; no wheezing or rales, normal inspiratory and expiratory effort Chest wall: without tenderness to palpitation Heart: PMI not displaced, RRR, s1 s2 normal, 1/6systolic murmur, No diastolic murmur, no rubs, gallops, thrills, or heaves Abdomen: Palpable aortic aneurysm; no hepatosplenomehaly, BS+; abdominal aorta nontender and not dilated by palpation. Back: no CVA tenderness Pulses: 2+  Musculoskeletal: full range of motion, normal strength, no joint deformities Extremities: Pulses 2+, no clubbing cyanosis or edema, Homan's sign negative  Neurologic: grossly nonfocal; Cranial nerves grossly wnl Psychologic: Normal mood and affect   ECG (independently read by me): Sinus bradycardia with PAC.  Right bundle-branch block.  LABS:  BMP Latest Ref Rng 02/28/2015 07/20/2014 02/03/2014  Glucose 70 - 99 mg/dL - 97 91  BUN 6 - 23 mg/dL - 23 13  Creatinine 0.50 - 1.10 mg/dL 1.20(H) 1.75(H) 0.82  Sodium 137 - 147 mEq/L - 141 140  Potassium 3.7 - 5.3 mEq/L - 3.7 4.0  Chloride 96 - 112 mEq/L - 99 105  CO2 19 - 32 mEq/L - 30 26  Calcium 8.4 - 10.5 mg/dL - 9.2 8.8     Hepatic Function Latest Ref Rng 05/21/2013 05/20/2013 12/28/2012  Total Protein 6.0 - 8.3 g/dL 5.6(L) 6.8 6.6  Albumin 3.5 - 5.2 g/dL 2.1(L) 2.5(L) 2.4(L)  AST 0 - 37 U/L 53(H) 60(H) 11  ALT 0 - 35 U/L 28 30 11   Alk Phosphatase 39 - 117 U/L 92 122(H) 106  Total Bilirubin 0.3 - 1.2 mg/dL 0.6 0.6 0.4  Bilirubin, Direct 0.0 - 0.3 mg/dL - - -    CBC Latest Ref Rng 07/20/2014 02/03/2014 02/02/2014  WBC 4.0 - 10.5 K/uL 6.4 5.7 6.9  Hemoglobin 12.0 - 15.0 g/dL 12.1 13.0 13.7  Hematocrit 36.0 - 46.0 % 37.1 39.9 41.8   Platelets 150 - 400 K/uL 115(L) 99(L) 120(L)  Lab Results  Component Value Date   MCV 93.9 07/20/2014   MCV 93.2 02/03/2014   MCV 93.5 02/02/2014    Lab Results  Component Value Date   TSH 3.556 02/02/2014    BNP No results found for: BNP  ProBNP    Component Value Date/Time   PROBNP 484.0* 05/23/2012 1326     Lipid Panel     Component Value Date/Time   CHOL 185 09/02/2012 0521   TRIG 330* 09/02/2012 0521   HDL 25* 09/02/2012 0521   CHOLHDL 7.4 09/02/2012 0521   VLDL 66* 09/02/2012 0521   LDLCALC 94 09/02/2012 0521     RADIOLOGY: CLINICAL DATA: 80 year old female with abdominal aortic aneurysm  EXAM: CTA ABDOMEN AND PELVIS wITHOUT AND WITH CONTRAST  TECHNIQUE: Multidetector CT imaging of the abdomen and pelvis was performed using the standard protocol during bolus administration of intravenous contrast. Multiplanar reconstructed images and MIPs were obtained and reviewed to evaluate the vascular anatomy.  CONTRAST: 161m OMNIPAQUE IOHEXOL 350 MG/ML SOLN  COMPARISON: Prior CTA of the abdomen and pelvis 06/07/2014 ; and 05/25/2013  FINDINGS: VASCULAR  Aorta: Extensive heterogeneous, irregular and intermittently ulcerated plaque in the visualized descending thoracic aorta. Including a focal outpouching of the aortic wall at a site of developing penetrating aortic ulcer, the maximal descending thoracic aortic diameter is 4.5 cm on image 12 of series 401 which remains unchanged dating back to June of 2014. A second focus of eccentric aneurysmal dilatation at the aortic hiatus remains unchanged at 4.1 cm.  There is focal eccentric aneurysmal dilatation of the suprarenal abdominal aorta between the origins of the celiac and superior mesenteric arteries likely secondary to a penetrating ulcer at the 1 o'clock position. The aorta measures 3.8 cm in diameter here itch is unchanged compared to prior.  Focal aneurysmal dilatation of the  juxtarenal abdominal aorta again with irregular atherosclerotic plaque and small penetrating ulcers. The maximal aortic diameter at the origin of the second left renal artery is 4.0 x 3.4 cm which is slightly enlarged compared to 3.8 cm previously.  The largest aneurysmal dilatation is at the distal infrarenal segment be low at the origin of the inferior mesenteric artery. The aorta is best measured on the sagittal and coronal reformatted images secondary to tortuosity. The maximal aortic dimensions are 6.0 x 5.9 cm which is perhaps minimally enlarged compared to 5.8 x 5.9 cm previously. The aneurysm extends to the aortic bifurcation. No significant aneurysmal dilatation of the common iliac arteries.  Celiac: The origin is widely patent. Conventional hepatic arterial anatomy. No visceral aneurysms identified.  SMA: High-grade narrowing of the origin of the superior mesenteric artery due in part to a focal penetrating ulcer arising at the left margin as well as heterogeneous and irregular fibro fatty plaque. The remainder of the vessel is unremarkable.  Renals: Multiple renal arteries bilaterally. There are 2 right-sided and 3 left-sided renal arteries. Mild narrowing at the origin of the dominant right renal artery. There is a small accessory right renal artery which supplies approximately 20-25% of the lower pole. On the left the most superior renal artery is dominant and supplies approximately 50% of the kidney. The 2 accessory renal artery supplies the interpolar region and lower pole. There is at least moderate stenosis of the first accessory renal artery partially due to an adjacent thrombosed penetrating aortic ulcer.  IMA: Patent. Arises just above the aneurysmal segment.  Inflow: Common iliac arteries demonstrate scattered heterogeneous atherosclerotic plaque without significant stenosis, dissection or aneurysm  bilaterally. There is focal high-grade stenosis of  the proximal right internal iliac artery with associated focal ectasia and partial thrombosis of the ectatic segment. The right anterior division is occluded. Left internal iliac artery remains patent including the anterior and posterior divisions. The external iliac arteries are relatively spared from disease.  Proximal Outflow: Scattered atherosclerotic plaque without significant stenosis, dissection or aneurysm.  Veins: No focal venous abnormality  NON-VASCULAR  Lower Chest: Respiratory motion limits evaluation for small pulmonary nodules. The visualized lung bases are clear. Incompletely imaged cardiomegaly with left atrial enlargement. No pericardial effusion. Unremarkable visualized esophagus.  Abdomen: Unremarkable CT appearance of the stomach, duodenum, spleen, adrenal glands and pancreas. Normal hepatic contour morphology. No discrete hepatic lesion. Small stones layer within the gallbladder lumen. No CT findings of cholecystitis.  Renal cortical atrophy bilaterally more significant in the left lower pole likely secondary to stenoses of the small accessory left renal arteries. No hydronephrosis. No definite enhancing renal mass.  No evidence of bowel obstruction or focal bowel wall thickening. Rectal dilatation containing a moderate size rectal stool ball concerning for constipation. The sigmoid colon is redundant. No free fluid or suspicious adenopathy.  Pelvis: Unremarkable uterus, adnexa and bladder save for pelvic floor laxity.  Bones/Soft Tissues: Advanced bilateral hip osteoarthritis which appears secondary to avascular necrosis. There is almost complete destruction of the left femoral head resulting in superior migration of the remaining femoral heads stump and femoral neck. Acetabula protrusio is also present on the left. Changes are less dramatic on the right with some preservation of the femoral head although there is fragmentation and depression  of the articular surface. No definite acute fracture or malalignment. The bones appear osteopenic. Vertebral body heights are maintained. No vertebral compression fracture.  Review of the MIP images confirms the above findings.  IMPRESSION: VASCULAR  1. Complex multi focal aneurysmal dilatation of the visualized descending thoracic and abdominal aorta as detailed above. There is some degree of aneurysmal dilatation due in part to focal penetrating aortic ulcers in the suprarenal and juxtarenal segments. The most significant aneurysmal dilatation is in the distal infrarenal segment below the origin of the inferior mesenteric artery were the aorta measures 6.0 x 5.9 cm which is slightly enlarged compared to 5.8 x 5.9 cm in in June of 2015. 2. Advanced atherosclerotic vascular disease with irregular, heterogeneous fibro fatty atherosclerotic plaque versus adherent mural thrombus throughout the descending thoracic and upper abdominal aorta. Multifocal penetrating aortic ulcers are again demonstrated as detailed above without significant interval change. 3. Multiple renal arteries bilaterally. There are 2 right-sided renal arteries and 3 left-sided renal arteries. Significant stenoses are noted in the 2 left accessory renal arteries with associated cortical atrophy of the posterior interpolar and lower pole regions of the left kidney. 4. High-grade stenosis of the proximal right internal iliac artery and occlusion of the anterior division. NON VASCULAR  1. Rectal dilatation with moderate volume retained stool suggesting constipation. 2. Pelvic floor laxity. 3. Cholelithiasis. 4. Mild renal cortical atrophy bilaterally most significant in the left posterior interpolar and lower pole regions likely secondary to chronic ischemia. 5. Severe left significantly worse than right secondary degenerative osteoarthritis of the hip joints. 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 There is near-total destruction of the left femoral head and evidence of subchondral depression and fragmentation of the right femoral head. No significant interval progression compared to the most recent prior imaging. 6. No evidence of acute fracture. Signed,  Criselda Peaches, MD  [facility]  [facility]   Electronically Signed  By: Jacqulynn Cadet M.D.  On: 02/28/2015 11:57    ASSESSMENT AND PLAN: Mr. [name] Clendenin is a 80 year old female who is 15 years status post PTCA and stenting of her LAD and RCA.  She has developed progressive peripheral vascular disease with significant enlargement of her abdominal aortic aneurysm which now measures 6.05.9 cm.  I have recommended that she see Dr. Joylene Igo very soon for follow-up evaluation.  Apparently in the past she had been told that if she were to have surgery she most likely would be referred to a [facility] for this to be completed.  She is not having any anginal symptoms.  I stressed him pulse of optimal blood pressure control.  I also discussed the fact that her aneurysm is now 6.0 , and increased risk for rupture.  In anticipation of probable  need for future AAA surgery, I am scheduling her for a nuclear perfusion study for preoperative screening.  She is currently on Bystolic 5 mg and furosemide 40 mg.  Her blood pressure is controlled.  She is taking Crestor 10 mg for hyperlipidemia.  Target LDL is less than 70.  She is taking Atrovent and Proventil.  In addition to Spiriva.  Her for COPD.  She does not have active wheezing presently.  She also has GERD for which he takes omeprazole.  I am recommending a complete set of laboratory be obtained.   Time spent: 40 minutes  Troy Sine, MD, [facility]  05/31/2015 7:37 PM

## 2015-05-31 NOTE — Patient Instructions (Signed)
You have been referred to Dr. Deitra Mayo to evaluate your abdominal aneurysm.  Your physician recommends that you return for lab work fasting.  Your physician has requested that you have a lexiscan myoview. For further information please visit [email or web address]. Please follow instruction sheet, as given.   Your physician recommends that you schedule a follow-up appointment pending Dr. Nicole Cella recommendations.

## 2015-06-11 ENCOUNTER — Telehealth: Payer: Self-pay | Admitting: Cardiovascular Disease

## 2015-06-11 DIAGNOSIS — R0789 Other chest pain: Secondary | ICD-10-CM | POA: Diagnosis not present

## 2015-06-11 DIAGNOSIS — Z79899 Other long term (current) drug therapy: Secondary | ICD-10-CM | POA: Diagnosis not present

## 2015-06-11 DIAGNOSIS — I499 Cardiac arrhythmia, unspecified: Secondary | ICD-10-CM | POA: Diagnosis not present

## 2015-06-11 DIAGNOSIS — I2510 Atherosclerotic heart disease of native coronary artery without angina pectoris: Secondary | ICD-10-CM | POA: Diagnosis not present

## 2015-06-11 NOTE — Telephone Encounter (Signed)
Spoke with [name] [name] - patient is at [facility] for lab work and is NOT fasting. Informed [name] [name] it would be best if she comes back when she is fasting to have lab work instead of having 2 venipunctures.

## 2015-06-12 LAB — CBC
HEMATOCRIT: 38.7 % (ref 36.0–46.0)
HEMOGLOBIN: 12.8 g/dL (ref 12.0–15.0)
MCH: 29.4 pg (ref 26.0–34.0)
MCHC: 33.1 g/dL (ref 30.0–36.0)
MCV: 89 fL (ref 78.0–100.0)
MPV: 11.8 fL (ref 8.6–12.4)
Platelets: 148 10*3/uL — ABNORMAL LOW (ref 150–400)
RBC: 4.35 MIL/uL (ref 3.87–5.11)
RDW: 14.5 % (ref 11.5–15.5)
WBC: 9.8 10*3/uL (ref 4.0–10.5)

## 2015-06-12 LAB — LIPID PANEL
CHOL/HDL RATIO: 2.6 ratio
Cholesterol: 161 mg/dL (ref 0–200)
HDL: 62 mg/dL (ref >=46)
LDL Cholesterol: 78 mg/dL (ref 0–99)
Triglycerides: 103 mg/dL (ref <150)
VLDL: 21 mg/dL (ref 0–40)

## 2015-06-12 LAB — COMPREHENSIVE METABOLIC PANEL
ALBUMIN: 3.7 g/dL (ref 3.5–5.2)
ALT: 11 U/L (ref 0–35)
AST: 22 U/L (ref 0–37)
Alkaline Phosphatase: 105 U/L (ref 39–117)
BUN: 20 mg/dL (ref 6–23)
CALCIUM: 9.5 mg/dL (ref 8.4–10.5)
CHLORIDE: 96 meq/L (ref 96–112)
CO2: 30 mEq/L (ref 19–32)
Creat: 1.24 mg/dL — ABNORMAL HIGH (ref 0.50–1.10)
Glucose, Bld: 96 mg/dL (ref 70–99)
POTASSIUM: 4.2 meq/L (ref 3.5–5.3)
Sodium: 140 mEq/L (ref 135–145)
TOTAL PROTEIN: 7.4 g/dL (ref 6.0–8.3)
Total Bilirubin: 0.6 mg/dL (ref 0.2–1.2)

## 2015-06-12 LAB — TSH: TSH: 2.201 u[IU]/mL (ref 0.350–4.500)

## 2015-06-15 ENCOUNTER — Encounter: Payer: Self-pay | Admitting: Vascular Surgery

## 2015-06-19 ENCOUNTER — Telehealth (HOSPITAL_COMMUNITY): Payer: Self-pay

## 2015-06-19 ENCOUNTER — Encounter: Payer: Self-pay | Admitting: Vascular Surgery

## 2015-06-19 NOTE — Telephone Encounter (Signed)
Encounter complete. 

## 2015-06-20 ENCOUNTER — Ambulatory Visit (INDEPENDENT_AMBULATORY_CARE_PROVIDER_SITE_OTHER): Payer: Medicare Other | Admitting: Vascular Surgery

## 2015-06-20 ENCOUNTER — Telehealth: Payer: Self-pay | Admitting: Vascular Surgery

## 2015-06-20 ENCOUNTER — Encounter: Payer: Self-pay | Admitting: Vascular Surgery

## 2015-06-20 VITALS — BP 139/80 | HR 65 | Ht 64.0 in | Wt 136.8 lb

## 2015-06-20 DIAGNOSIS — I714 Abdominal aortic aneurysm, without rupture, unspecified: Secondary | ICD-10-CM

## 2015-06-20 DIAGNOSIS — I2510 Atherosclerotic heart disease of native coronary artery without angina pectoris: Secondary | ICD-10-CM | POA: Diagnosis not present

## 2015-06-20 NOTE — Telephone Encounter (Signed)
LVM for pt: Dr. Sammuel Hines on vacation - he will look over [facility] note, then they will call her directly to make appointment. Notes faxed. They will notify us when appointment is made - kf

## 2015-06-20 NOTE — Progress Notes (Signed)
[facility] [place]  Patient name: [name] [name] MRN: [id number] DOB: [birth year] Sex: female  REASON FOR VISIT: Follow up of abdominal aortic aneurysm  HPI: [name] M Gulla is a 80 y.o. female who I have been following with an abdominal aortic aneurysm. When I last saw her on 06/07/2014 her aneurysm had enlarged to 5.8 cm. I explained that under normal risk patient we would consider elective repair at 5.5 cm. She is not normal risk given her age and debilitated state. 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 I do not think she would be a candidate for endovascular repair nor which she be a good candidate for open repair. If she one tic to be considered for a fenestrated graft we discussed potentially going to [facility] to be evaluated by Dr. Sammuel Hines.  She did not want to consider elective repair at this point and we simply arranged a follow up visit in 6 months. She did not return for that visit.  She did have a CT scan done in March of this year which showed that the maximum diameter of the aneurysm was 6 cm. It was noted that she had not followed up with [place] and therefore she was sent back for continued follow up of her aneurysm. She does have chronic low back pain and hip pain. She denies any significant abdominal pain. She denies any acute onset back or abdominal pain.  I reviewed her note from Dr. Eden Lathe office. She is status post PTCA and stenting of the LAD and RCA in the past. The patient also has significant COPD.  Past Medical History  Diagnosis Date  . Hypertension   . Hyperlipidemia   . COPD (chronic obstructive pulmonary disease)     multiple hospitalizations for exacerbations with acute bronchitis  . Degenerative joint disease     Bilateral hips  . Depression with anxiety   . GERD (gastroesophageal reflux disease)     Hiatal hernia  . Atrial arrhythmia     Atrial  fibrillation and atrial flutter diagnosed in the past;?  PSVT  . AAA (abdominal aortic aneurysm)     infrarenal; 07/2012: Diameter of 5.4 cm  . Arteriosclerotic cardiovascular disease (ASCVD)     Cardiac cath in 03/2000->  80% LAD, 70-80% RCA;  stent x1 in the LAD and x2 in the RCA with excellent angiographic outcome; repeat catheterization in late 2001 and 2003 revealed no restenosis; EF-50%  . Tobacco abuse, in remission     Quit in 2006; total consumption of 50-75 pack years  . Pituitary microadenoma     transsphenoidal resection in 09/2007  . Anemia   . Coronary artery disease   . PVD (peripheral vascular disease)   . Gall stones   . Hypokalemia   . Ileus   . SBO (small bowel obstruction)   . Hypernatremia   . Nephrolithiasis   . Renal failure   . DJD (degenerative joint disease)   . Encephalopathy   . Hyperlipidemia   . Dementia   . AAA (abdominal aortic aneurysm)    Family History  Problem Relation Age of Onset  . Heart disease Mother   . Hyperlipidemia Mother   . Hypertension Mother   . Stroke Mother   . Cancer Brother    SOCIAL HISTORY: History  Substance Use Topics  . Smoking status: Former Smoker -- 1.50 packs/day for 50 years    Types: Cigarettes  Quit date: 05/27/2005  . Smokeless tobacco: Former Systems developer    Types: Snuff    Quit date: 01/11/2008  . Alcohol Use: No   Allergies  Allergen Reactions  . Zoloft [Sertraline Hcl] Anxiety  . Asa [Aspirin] Other (See Comments)    "my ears bleed"  . Lidocaine Other (See Comments)    unknown  . Sulfonamide Derivatives Other (See Comments)    unknown  . Ativan [Lorazepam] Anxiety    Per daughter   Current Outpatient Prescriptions  Medication Sig Dispense Refill  . acetaminophen (TYLENOL) 325 MG tablet Take 2 tablets (650 mg total) by mouth every 4 (four) hours as needed. 60 tablet 12  . acidophilus (RISAQUAD) CAPS Take 2 capsules by mouth daily. 60 capsule 5  . ALPRAZolam (XANAX) 0.5 MG tablet Take 1 tablet (0.5  mg total) by mouth every 4 (four) hours as needed for anxiety. 120 tablet 2  . divalproex (DEPAKOTE SPRINKLE) 125 MG capsule Take 125-250 mg by mouth 2 (two) times daily. Take two capsules in the morning and take one capsule at bedtime    . furosemide (LASIX) 40 MG tablet Take 40 mg by mouth every morning.    Marland Kitchen HYDROcodone-acetaminophen (NORCO/VICODIN) 5-325 MG per tablet Take 1-2 tablets by mouth every 6 (six) hours as needed for moderate pain or severe pain.    Marland Kitchen ibuprofen (ADVIL,MOTRIN) 200 MG tablet Take 200 mg by mouth every 6 (six) hours as needed.    . multivitamin-iron-minerals-folic acid (THERAPEUTIC-M) TABS tablet Take 1 tablet by mouth daily.    . nebivolol (BYSTOLIC) 5 MG tablet Take 5 mg by mouth daily.    Marland Kitchen omeprazole (PRILOSEC) 20 MG capsule Take 20 mg by mouth daily.    . rosuvastatin (CRESTOR) 10 MG tablet Take 10 mg by mouth at bedtime.    Marland Kitchen tiotropium (SPIRIVA) 18 MCG inhalation capsule Place 18 mcg into inhaler and inhale daily.    . traMADol (ULTRAM) 50 MG tablet Take 50 mg by mouth every morning.     . traZODone (DESYREL) 50 MG tablet Take 1 tablet (50 mg total) by mouth at bedtime as needed for sleep (insomnia). 30 tablet 12  . albuterol (PROVENTIL) (5 MG/ML) 0.5% nebulizer solution Take 0.5 mLs (2.5 mg total) by nebulization every 4 (four) hours as needed for wheezing. (Patient not taking: Reported on 06/20/2015) 20 mL 12  . INCRUSE ELLIPTA 62.5 MCG/INH AEPB     . ipratropium (ATROVENT) 0.02 % nebulizer solution Take 2.5 mLs (0.5 mg total) by nebulization every 4 (four) hours as needed for wheezing. (Patient not taking: Reported on 06/20/2015) 75 mL 12  . polyethylene glycol (MIRALAX / GLYCOLAX) packet Take 17 g by mouth daily. (Patient not taking: Reported on 06/20/2015) 14 each 0   No current facility-administered medications for this visit.   REVIEW OF SYSTEMS: Valu.Nieves ] denotes positive finding; [  ] denotes negative finding  CARDIOVASCULAR:  [ ]  chest pain   [ ]  chest  pressure   Valu.Nieves ] palpitations   [ ]  orthopnea   Valu.Nieves ] dyspnea on exertion   [ ]  claudication   [ ]  rest pain   [ ]  DVT   [ ]  phlebitis PULMONARY:   [ ]  productive cough   [ ]  asthma   [ ]  wheezing NEUROLOGIC:   [ ]  weakness  [ ]  paresthesias  [ ]  aphasia  [ ]  amaurosis  Valu.Nieves ] dizziness HEMATOLOGIC:   [ ]  bleeding problems   [ ]  clotting disorders  MUSCULOSKELETAL:  [ ]  joint pain   [ ]  joint swelling Valu.Nieves ] leg swelling GASTROINTESTINAL: [ ]   blood in stool  [ ]   hematemesis GENITOURINARY:  [ ]   dysuria  [ ]   hematuria PSYCHIATRIC:  Valu.Nieves ] history of major depression INTEGUMENTARY:  [ ]  rashes  [ ]  ulcers CONSTITUTIONAL:  [ ]  fever   [ ]  chills  PHYSICAL EXAM: Filed Vitals:   06/20/15 0954  BP: 139/80  Pulse: 65  Height: 5\' 4"  (1.626 m)  Weight: 136 lb 12.8 oz (62.052 kg)  SpO2: 100%   GENERAL: The patient is a well-nourished female, in no acute distress. The vital signs are documented above. CARDIOVASCULAR: There is a regular rate and rhythm. I do not detect carotid bruits. She has palpable femoral pulses and palpable dorsalis pedis pulses bilaterally. PULMONARY: There is good air exchange bilaterally without wheezing or rales. ABDOMEN: Soft and non-tender with normal pitched bowel sounds.  Her aneurysm is palpable and nontender. MUSCULOSKELETAL: she has scoliosis. NEUROLOGIC: No focal weakness or paresthesias are detected. SKIN: There are no ulcers or rashes noted. PSYCHIATRIC: The patient has a normal affect.  DATA:  I have reviewed her CT scan that was done in March and shows that the aneurysm has enlarged to 6 cm in maximum diameter. Thus it has only enlarged a couple millimeters over the last year. The focal aneurysmal segment of the distal thoracic aorta has not changed significantly.  Her renal function is reasonable. On 02/28/2015 are creatinine was 1.2.  MEDICAL ISSUES:  PARARENAL 6 CM ABDOMINAL AORTIC ANEURYSM: The aneurysm has enlarged 2 mm in the last year. However it is at  the size that we have previously discussed the risk of rupture which I think is 15% per year. We have previously discussed evaluation in [facility] for possible fenestrated graft however a year ago she was not interested in this. Today she states that she would like to consider this. Given her debilitated state she would be at high-risk for open surgery for this pararenal aneurysm. In addition she has a focal aneurysmal segment of her distal thoracic aorta. Of note she is nonambulatory because of her hip arthritis. She is not a smoker. We will try to make arrangements for her to be evaluated in [facility] for a possible fenestrated graft. We will try to arrange for her to x-rays to go with her for her appointment in [facility].  Deitra Mayo [facility] [place]: [phone]

## 2015-06-21 ENCOUNTER — Ambulatory Visit (HOSPITAL_COMMUNITY)
Admission: RE | Admit: 2015-06-21 | Discharge: 2015-06-21 | Disposition: A | Discharge status: Home / Self Care | Payer: Medicare Other | Source: Ambulatory Visit | Attending: Cardiovascular Disease | Admitting: Cardiovascular Disease

## 2015-06-21 DIAGNOSIS — I499 Cardiac arrhythmia, unspecified: Secondary | ICD-10-CM

## 2015-06-21 DIAGNOSIS — I2510 Atherosclerotic heart disease of native coronary artery without angina pectoris: Secondary | ICD-10-CM | POA: Diagnosis not present

## 2015-06-21 DIAGNOSIS — R079 Chest pain, unspecified: Secondary | ICD-10-CM | POA: Diagnosis not present

## 2015-06-21 DIAGNOSIS — R0789 Other chest pain: Secondary | ICD-10-CM

## 2015-06-21 DIAGNOSIS — I498 Other specified cardiac arrhythmias: Secondary | ICD-10-CM

## 2015-06-21 LAB — MYOCARDIAL PERFUSION IMAGING
CHL CUP NUCLEAR SSS: 3
CHL CUP RESTING HR STRESS: 53 {beats}/min
CSEPPHR: 71 {beats}/min
LVDIAVOL: 72 mL
LVSYSVOL: 23 mL
NUC STRESS TID: 1.14
SDS: 2
SRS: 2

## 2015-06-21 MED ORDER — AMINOPHYLLINE 25 MG/ML IV SOLN
75.0000 mg | Freq: Once | INTRAVENOUS | Status: AC
  Administered 2015-06-21: 75 mg via INTRAVENOUS

## 2015-06-21 MED ORDER — REGADENOSON 0.4 MG/5ML IV SOLN
0.4000 mg | Freq: Once | INTRAVENOUS | Status: AC
  Administered 2015-06-21: 0.4 mg via INTRAVENOUS

## 2015-06-21 MED ORDER — TECHNETIUM TC 99M SESTAMIBI GENERIC - CARDIOLITE
9.7000 | Freq: Once | INTRAVENOUS | Status: AC | PRN
  Administered 2015-06-21: 10 via INTRAVENOUS

## 2015-06-21 MED ORDER — TECHNETIUM TC 99M SESTAMIBI GENERIC - CARDIOLITE
29.2000 | Freq: Once | INTRAVENOUS | Status: AC | PRN
  Administered 2015-06-21: 29.2 via INTRAVENOUS

## 2015-07-09 ENCOUNTER — Telehealth: Payer: Self-pay | Admitting: Vascular Surgery

## 2015-07-09 NOTE — Telephone Encounter (Signed)
Dr. Eddie Dibbles office called re: unable to reach pt prior to appointment. They will keep trying, as will I. They need to give pt instructions for test tomorrow am. Please notify pt to call them at [phone] for instructions!

## 2015-07-10 DIAGNOSIS — I714 Abdominal aortic aneurysm, without rupture: Secondary | ICD-10-CM | POA: Diagnosis not present

## 2015-07-10 DIAGNOSIS — M1990 Unspecified osteoarthritis, unspecified site: Secondary | ICD-10-CM | POA: Diagnosis not present

## 2015-07-10 DIAGNOSIS — M5135 Other intervertebral disc degeneration, thoracolumbar region: Secondary | ICD-10-CM | POA: Diagnosis not present

## 2015-07-10 DIAGNOSIS — I712 Thoracic aortic aneurysm, without rupture: Secondary | ICD-10-CM | POA: Diagnosis not present

## 2015-07-10 DIAGNOSIS — I716 Thoracoabdominal aortic aneurysm, without rupture: Secondary | ICD-10-CM | POA: Diagnosis not present

## 2015-07-10 DIAGNOSIS — Z01810 Encounter for preprocedural cardiovascular examination: Secondary | ICD-10-CM | POA: Diagnosis not present

## 2015-07-10 DIAGNOSIS — Z87891 Personal history of nicotine dependence: Secondary | ICD-10-CM | POA: Diagnosis not present

## 2015-07-10 DIAGNOSIS — K429 Umbilical hernia without obstruction or gangrene: Secondary | ICD-10-CM | POA: Diagnosis not present

## 2015-07-10 DIAGNOSIS — I6522 Occlusion and stenosis of left carotid artery: Secondary | ICD-10-CM | POA: Diagnosis not present

## 2015-07-10 DIAGNOSIS — I701 Atherosclerosis of renal artery: Secondary | ICD-10-CM | POA: Diagnosis not present

## 2015-07-10 DIAGNOSIS — I2510 Atherosclerotic heart disease of native coronary artery without angina pectoris: Secondary | ICD-10-CM | POA: Diagnosis not present

## 2015-07-10 DIAGNOSIS — I10 Essential (primary) hypertension: Secondary | ICD-10-CM | POA: Diagnosis not present

## 2015-07-10 DIAGNOSIS — M8789 Other osteonecrosis, multiple sites: Secondary | ICD-10-CM | POA: Diagnosis not present

## 2015-07-10 DIAGNOSIS — N261 Atrophy of kidney (terminal): Secondary | ICD-10-CM | POA: Diagnosis not present

## 2015-07-20 ENCOUNTER — Other Ambulatory Visit: Payer: Self-pay | Admitting: *Deleted

## 2015-07-20 DIAGNOSIS — I714 Abdominal aortic aneurysm, without rupture, unspecified: Secondary | ICD-10-CM

## 2015-07-30 ENCOUNTER — Telehealth: Payer: Self-pay | Admitting: Vascular Surgery

## 2015-07-30 NOTE — Telephone Encounter (Signed)
-----   Message from Gena Fray sent at 07/24/2015  4:56 PM EDT ----- Regarding: FW: f/u   ----- Message -----    From: Mena Goes, RN    Sent: 07/20/2015  11:03 AM      To: Gena Fray Subject: RE: f/u                                        Anything for Graham's mommie!  ----- Message -----    From: Gena Fray    Sent: 07/20/2015  10:05 AM      To: Mena Goes, RN Subject: FW: f/u                                        Zigmund Daniel, would you put in an order for the CT as [facility] indicated below? Please, with a cherry on top.   Thank you! Hinton Dyer ----- Message -----    From: Angelia Mould, MD    Sent: 07/18/2015   2:38 PM      To: Mignon Pine Pool Subject: f/u                                            This patient was evaluated by Dr. Sammuel Hines in [facility] and she was not a candidate for a fenestrated graft. She was last seen in late June by so I could see her in early January with a follow up CT scan to continue to follow her aneurysm. Thank you. CD

## 2015-07-30 NOTE — Telephone Encounter (Signed)
Schedule, mailed letter and lm, dpm

## 2015-09-20 ENCOUNTER — Ambulatory Visit (INDEPENDENT_AMBULATORY_CARE_PROVIDER_SITE_OTHER): Payer: Medicare Other | Admitting: Otolaryngology

## 2015-09-20 DIAGNOSIS — H906 Mixed conductive and sensorineural hearing loss, bilateral: Secondary | ICD-10-CM | POA: Diagnosis not present

## 2015-09-20 DIAGNOSIS — H6123 Impacted cerumen, bilateral: Secondary | ICD-10-CM

## 2015-09-26 ENCOUNTER — Encounter: Payer: Self-pay | Admitting: Licensed Clinical Social Worker

## 2015-09-27 NOTE — Progress Notes (Signed)
This encounter was created in error - please disregard.

## 2015-10-08 DIAGNOSIS — Z23 Encounter for immunization: Secondary | ICD-10-CM | POA: Diagnosis not present

## 2015-10-17 DIAGNOSIS — I10 Essential (primary) hypertension: Secondary | ICD-10-CM | POA: Diagnosis not present

## 2015-10-17 DIAGNOSIS — I714 Abdominal aortic aneurysm, without rupture: Secondary | ICD-10-CM | POA: Diagnosis not present

## 2015-10-17 DIAGNOSIS — J449 Chronic obstructive pulmonary disease, unspecified: Secondary | ICD-10-CM | POA: Diagnosis not present

## 2015-10-17 DIAGNOSIS — I2510 Atherosclerotic heart disease of native coronary artery without angina pectoris: Secondary | ICD-10-CM | POA: Diagnosis not present

## 2015-10-22 ENCOUNTER — Encounter: Payer: Self-pay | Admitting: Vascular Surgery

## 2015-10-23 ENCOUNTER — Other Ambulatory Visit: Payer: Self-pay | Admitting: *Deleted

## 2015-10-23 DIAGNOSIS — Z01812 Encounter for preprocedural laboratory examination: Secondary | ICD-10-CM

## 2015-10-24 ENCOUNTER — Ambulatory Visit: Payer: Medicare Other | Admitting: Vascular Surgery

## 2015-10-24 ENCOUNTER — Inpatient Hospital Stay: Admission: RE | Admit: 2015-10-24 | Payer: Medicare Other | Source: Ambulatory Visit

## 2015-10-30 ENCOUNTER — Inpatient Hospital Stay: Admission: RE | Admit: 2015-10-30 | Payer: Medicare Other | Source: Ambulatory Visit

## 2015-10-30 ENCOUNTER — Other Ambulatory Visit: Payer: Self-pay

## 2015-10-30 DIAGNOSIS — I714 Abdominal aortic aneurysm, without rupture, unspecified: Secondary | ICD-10-CM

## 2015-10-31 DIAGNOSIS — I2510 Atherosclerotic heart disease of native coronary artery without angina pectoris: Secondary | ICD-10-CM | POA: Diagnosis not present

## 2015-10-31 DIAGNOSIS — J449 Chronic obstructive pulmonary disease, unspecified: Secondary | ICD-10-CM | POA: Diagnosis not present

## 2015-10-31 DIAGNOSIS — M87859 Other osteonecrosis, unspecified femur: Secondary | ICD-10-CM | POA: Diagnosis not present

## 2015-10-31 DIAGNOSIS — I714 Abdominal aortic aneurysm, without rupture: Secondary | ICD-10-CM | POA: Diagnosis not present

## 2015-11-06 DIAGNOSIS — I714 Abdominal aortic aneurysm, without rupture: Secondary | ICD-10-CM | POA: Diagnosis not present

## 2015-11-07 ENCOUNTER — Ambulatory Visit
Admission: RE | Admit: 2015-11-07 | Discharge: 2015-11-07 | Disposition: A | Discharge status: Home / Self Care | Payer: Medicare Other | Source: Ambulatory Visit | Attending: Vascular Surgery | Admitting: Vascular Surgery

## 2015-11-07 DIAGNOSIS — I714 Abdominal aortic aneurysm, without rupture, unspecified: Secondary | ICD-10-CM

## 2015-11-07 MED ORDER — IOPAMIDOL (ISOVUE-370) INJECTION 76%
75.0000 mL | Freq: Once | INTRAVENOUS | Status: AC | PRN
Start: 2015-11-07 — End: 2015-11-07
  Administered 2015-11-07: 75 mL via INTRAVENOUS

## 2015-11-12 ENCOUNTER — Telehealth (HOSPITAL_COMMUNITY): Payer: Self-pay

## 2015-11-12 NOTE — Telephone Encounter (Signed)
Opened in error

## 2015-11-29 ENCOUNTER — Encounter: Payer: Self-pay | Admitting: Vascular Surgery

## 2015-12-05 ENCOUNTER — Ambulatory Visit: Payer: Medicare Other | Admitting: Vascular Surgery

## 2016-03-20 DIAGNOSIS — I714 Abdominal aortic aneurysm, without rupture: Secondary | ICD-10-CM | POA: Diagnosis not present

## 2016-03-20 DIAGNOSIS — I10 Essential (primary) hypertension: Secondary | ICD-10-CM | POA: Diagnosis not present

## 2016-03-20 DIAGNOSIS — J449 Chronic obstructive pulmonary disease, unspecified: Secondary | ICD-10-CM | POA: Diagnosis not present

## 2016-03-20 DIAGNOSIS — M545 Low back pain: Secondary | ICD-10-CM | POA: Diagnosis not present

## 2016-03-20 DIAGNOSIS — N390 Urinary tract infection, site not specified: Secondary | ICD-10-CM | POA: Diagnosis not present

## 2016-06-24 DIAGNOSIS — M6281 Muscle weakness (generalized): Secondary | ICD-10-CM | POA: Diagnosis not present

## 2016-06-24 DIAGNOSIS — K219 Gastro-esophageal reflux disease without esophagitis: Secondary | ICD-10-CM | POA: Diagnosis not present

## 2016-06-24 DIAGNOSIS — I2510 Atherosclerotic heart disease of native coronary artery without angina pectoris: Secondary | ICD-10-CM | POA: Diagnosis not present

## 2016-06-24 DIAGNOSIS — J449 Chronic obstructive pulmonary disease, unspecified: Secondary | ICD-10-CM | POA: Diagnosis not present

## 2016-06-24 DIAGNOSIS — R41841 Cognitive communication deficit: Secondary | ICD-10-CM | POA: Diagnosis not present

## 2016-06-25 DIAGNOSIS — K219 Gastro-esophageal reflux disease without esophagitis: Secondary | ICD-10-CM | POA: Diagnosis not present

## 2016-06-25 DIAGNOSIS — I2510 Atherosclerotic heart disease of native coronary artery without angina pectoris: Secondary | ICD-10-CM | POA: Diagnosis not present

## 2016-06-25 DIAGNOSIS — J449 Chronic obstructive pulmonary disease, unspecified: Secondary | ICD-10-CM | POA: Diagnosis not present

## 2016-06-25 DIAGNOSIS — M6281 Muscle weakness (generalized): Secondary | ICD-10-CM | POA: Diagnosis not present

## 2016-06-25 DIAGNOSIS — R41841 Cognitive communication deficit: Secondary | ICD-10-CM | POA: Diagnosis not present

## 2016-06-26 DIAGNOSIS — M6281 Muscle weakness (generalized): Secondary | ICD-10-CM | POA: Diagnosis not present

## 2016-06-26 DIAGNOSIS — J449 Chronic obstructive pulmonary disease, unspecified: Secondary | ICD-10-CM | POA: Diagnosis not present

## 2016-06-26 DIAGNOSIS — K219 Gastro-esophageal reflux disease without esophagitis: Secondary | ICD-10-CM | POA: Diagnosis not present

## 2016-06-26 DIAGNOSIS — I2510 Atherosclerotic heart disease of native coronary artery without angina pectoris: Secondary | ICD-10-CM | POA: Diagnosis not present

## 2016-06-26 DIAGNOSIS — R41841 Cognitive communication deficit: Secondary | ICD-10-CM | POA: Diagnosis not present

## 2016-06-27 DIAGNOSIS — K219 Gastro-esophageal reflux disease without esophagitis: Secondary | ICD-10-CM | POA: Diagnosis not present

## 2016-06-27 DIAGNOSIS — M6281 Muscle weakness (generalized): Secondary | ICD-10-CM | POA: Diagnosis not present

## 2016-06-27 DIAGNOSIS — J449 Chronic obstructive pulmonary disease, unspecified: Secondary | ICD-10-CM | POA: Diagnosis not present

## 2016-06-27 DIAGNOSIS — I2510 Atherosclerotic heart disease of native coronary artery without angina pectoris: Secondary | ICD-10-CM | POA: Diagnosis not present

## 2016-06-27 DIAGNOSIS — R41841 Cognitive communication deficit: Secondary | ICD-10-CM | POA: Diagnosis not present

## 2016-06-28 DIAGNOSIS — I2510 Atherosclerotic heart disease of native coronary artery without angina pectoris: Secondary | ICD-10-CM | POA: Diagnosis not present

## 2016-06-28 DIAGNOSIS — J449 Chronic obstructive pulmonary disease, unspecified: Secondary | ICD-10-CM | POA: Diagnosis not present

## 2016-06-28 DIAGNOSIS — K219 Gastro-esophageal reflux disease without esophagitis: Secondary | ICD-10-CM | POA: Diagnosis not present

## 2016-06-28 DIAGNOSIS — R41841 Cognitive communication deficit: Secondary | ICD-10-CM | POA: Diagnosis not present

## 2016-06-28 DIAGNOSIS — M6281 Muscle weakness (generalized): Secondary | ICD-10-CM | POA: Diagnosis not present

## 2016-06-29 DIAGNOSIS — M6281 Muscle weakness (generalized): Secondary | ICD-10-CM | POA: Diagnosis not present

## 2016-06-29 DIAGNOSIS — I2510 Atherosclerotic heart disease of native coronary artery without angina pectoris: Secondary | ICD-10-CM | POA: Diagnosis not present

## 2016-06-29 DIAGNOSIS — R41841 Cognitive communication deficit: Secondary | ICD-10-CM | POA: Diagnosis not present

## 2016-06-29 DIAGNOSIS — K219 Gastro-esophageal reflux disease without esophagitis: Secondary | ICD-10-CM | POA: Diagnosis not present

## 2016-06-29 DIAGNOSIS — J449 Chronic obstructive pulmonary disease, unspecified: Secondary | ICD-10-CM | POA: Diagnosis not present

## 2016-06-30 DIAGNOSIS — R41841 Cognitive communication deficit: Secondary | ICD-10-CM | POA: Diagnosis not present

## 2016-06-30 DIAGNOSIS — I2510 Atherosclerotic heart disease of native coronary artery without angina pectoris: Secondary | ICD-10-CM | POA: Diagnosis not present

## 2016-06-30 DIAGNOSIS — M6281 Muscle weakness (generalized): Secondary | ICD-10-CM | POA: Diagnosis not present

## 2016-06-30 DIAGNOSIS — D649 Anemia, unspecified: Secondary | ICD-10-CM | POA: Diagnosis not present

## 2016-06-30 DIAGNOSIS — E782 Mixed hyperlipidemia: Secondary | ICD-10-CM | POA: Diagnosis not present

## 2016-06-30 DIAGNOSIS — Z79899 Other long term (current) drug therapy: Secondary | ICD-10-CM | POA: Diagnosis not present

## 2016-06-30 DIAGNOSIS — E7800 Pure hypercholesterolemia, unspecified: Secondary | ICD-10-CM | POA: Diagnosis not present

## 2016-06-30 DIAGNOSIS — J449 Chronic obstructive pulmonary disease, unspecified: Secondary | ICD-10-CM | POA: Diagnosis not present

## 2016-06-30 DIAGNOSIS — K219 Gastro-esophageal reflux disease without esophagitis: Secondary | ICD-10-CM | POA: Diagnosis not present

## 2016-07-02 DIAGNOSIS — J449 Chronic obstructive pulmonary disease, unspecified: Secondary | ICD-10-CM | POA: Diagnosis not present

## 2016-07-02 DIAGNOSIS — I259 Chronic ischemic heart disease, unspecified: Secondary | ICD-10-CM | POA: Diagnosis not present

## 2016-07-02 DIAGNOSIS — K219 Gastro-esophageal reflux disease without esophagitis: Secondary | ICD-10-CM | POA: Diagnosis not present

## 2016-07-02 DIAGNOSIS — E119 Type 2 diabetes mellitus without complications: Secondary | ICD-10-CM | POA: Diagnosis not present

## 2016-07-02 DIAGNOSIS — M6281 Muscle weakness (generalized): Secondary | ICD-10-CM | POA: Diagnosis not present

## 2016-07-02 DIAGNOSIS — I10 Essential (primary) hypertension: Secondary | ICD-10-CM | POA: Diagnosis not present

## 2016-07-02 DIAGNOSIS — R531 Weakness: Secondary | ICD-10-CM | POA: Diagnosis not present

## 2016-07-02 DIAGNOSIS — I2510 Atherosclerotic heart disease of native coronary artery without angina pectoris: Secondary | ICD-10-CM | POA: Diagnosis not present

## 2016-07-02 DIAGNOSIS — R41841 Cognitive communication deficit: Secondary | ICD-10-CM | POA: Diagnosis not present

## 2016-07-03 DIAGNOSIS — K219 Gastro-esophageal reflux disease without esophagitis: Secondary | ICD-10-CM | POA: Diagnosis not present

## 2016-07-03 DIAGNOSIS — R41841 Cognitive communication deficit: Secondary | ICD-10-CM | POA: Diagnosis not present

## 2016-07-03 DIAGNOSIS — M6281 Muscle weakness (generalized): Secondary | ICD-10-CM | POA: Diagnosis not present

## 2016-07-03 DIAGNOSIS — I2510 Atherosclerotic heart disease of native coronary artery without angina pectoris: Secondary | ICD-10-CM | POA: Diagnosis not present

## 2016-07-03 DIAGNOSIS — J449 Chronic obstructive pulmonary disease, unspecified: Secondary | ICD-10-CM | POA: Diagnosis not present

## 2016-07-04 DIAGNOSIS — M6281 Muscle weakness (generalized): Secondary | ICD-10-CM | POA: Diagnosis not present

## 2016-07-04 DIAGNOSIS — J449 Chronic obstructive pulmonary disease, unspecified: Secondary | ICD-10-CM | POA: Diagnosis not present

## 2016-07-04 DIAGNOSIS — K219 Gastro-esophageal reflux disease without esophagitis: Secondary | ICD-10-CM | POA: Diagnosis not present

## 2016-07-04 DIAGNOSIS — I2510 Atherosclerotic heart disease of native coronary artery without angina pectoris: Secondary | ICD-10-CM | POA: Diagnosis not present

## 2016-07-04 DIAGNOSIS — R41841 Cognitive communication deficit: Secondary | ICD-10-CM | POA: Diagnosis not present

## 2016-07-05 DIAGNOSIS — J449 Chronic obstructive pulmonary disease, unspecified: Secondary | ICD-10-CM | POA: Diagnosis not present

## 2016-07-05 DIAGNOSIS — R41841 Cognitive communication deficit: Secondary | ICD-10-CM | POA: Diagnosis not present

## 2016-07-05 DIAGNOSIS — I2510 Atherosclerotic heart disease of native coronary artery without angina pectoris: Secondary | ICD-10-CM | POA: Diagnosis not present

## 2016-07-05 DIAGNOSIS — M6281 Muscle weakness (generalized): Secondary | ICD-10-CM | POA: Diagnosis not present

## 2016-07-05 DIAGNOSIS — K219 Gastro-esophageal reflux disease without esophagitis: Secondary | ICD-10-CM | POA: Diagnosis not present

## 2016-07-06 DIAGNOSIS — J449 Chronic obstructive pulmonary disease, unspecified: Secondary | ICD-10-CM | POA: Diagnosis not present

## 2016-07-06 DIAGNOSIS — I2510 Atherosclerotic heart disease of native coronary artery without angina pectoris: Secondary | ICD-10-CM | POA: Diagnosis not present

## 2016-07-06 DIAGNOSIS — M6281 Muscle weakness (generalized): Secondary | ICD-10-CM | POA: Diagnosis not present

## 2016-07-06 DIAGNOSIS — K219 Gastro-esophageal reflux disease without esophagitis: Secondary | ICD-10-CM | POA: Diagnosis not present

## 2016-07-06 DIAGNOSIS — R41841 Cognitive communication deficit: Secondary | ICD-10-CM | POA: Diagnosis not present

## 2016-07-07 DIAGNOSIS — I2510 Atherosclerotic heart disease of native coronary artery without angina pectoris: Secondary | ICD-10-CM | POA: Diagnosis not present

## 2016-07-07 DIAGNOSIS — R41841 Cognitive communication deficit: Secondary | ICD-10-CM | POA: Diagnosis not present

## 2016-07-07 DIAGNOSIS — K219 Gastro-esophageal reflux disease without esophagitis: Secondary | ICD-10-CM | POA: Diagnosis not present

## 2016-07-07 DIAGNOSIS — J449 Chronic obstructive pulmonary disease, unspecified: Secondary | ICD-10-CM | POA: Diagnosis not present

## 2016-07-07 DIAGNOSIS — M6281 Muscle weakness (generalized): Secondary | ICD-10-CM | POA: Diagnosis not present

## 2016-07-08 DIAGNOSIS — F419 Anxiety disorder, unspecified: Secondary | ICD-10-CM | POA: Diagnosis not present

## 2016-07-08 DIAGNOSIS — I2510 Atherosclerotic heart disease of native coronary artery without angina pectoris: Secondary | ICD-10-CM | POA: Diagnosis not present

## 2016-07-08 DIAGNOSIS — M6281 Muscle weakness (generalized): Secondary | ICD-10-CM | POA: Diagnosis not present

## 2016-07-08 DIAGNOSIS — R41841 Cognitive communication deficit: Secondary | ICD-10-CM | POA: Diagnosis not present

## 2016-07-08 DIAGNOSIS — K219 Gastro-esophageal reflux disease without esophagitis: Secondary | ICD-10-CM | POA: Diagnosis not present

## 2016-07-08 DIAGNOSIS — J449 Chronic obstructive pulmonary disease, unspecified: Secondary | ICD-10-CM | POA: Diagnosis not present

## 2016-07-09 DIAGNOSIS — K219 Gastro-esophageal reflux disease without esophagitis: Secondary | ICD-10-CM | POA: Diagnosis not present

## 2016-07-09 DIAGNOSIS — R41841 Cognitive communication deficit: Secondary | ICD-10-CM | POA: Diagnosis not present

## 2016-07-09 DIAGNOSIS — I2510 Atherosclerotic heart disease of native coronary artery without angina pectoris: Secondary | ICD-10-CM | POA: Diagnosis not present

## 2016-07-09 DIAGNOSIS — E039 Hypothyroidism, unspecified: Secondary | ICD-10-CM | POA: Diagnosis not present

## 2016-07-09 DIAGNOSIS — J449 Chronic obstructive pulmonary disease, unspecified: Secondary | ICD-10-CM | POA: Diagnosis not present

## 2016-07-09 DIAGNOSIS — M6281 Muscle weakness (generalized): Secondary | ICD-10-CM | POA: Diagnosis not present

## 2016-07-09 DIAGNOSIS — E119 Type 2 diabetes mellitus without complications: Secondary | ICD-10-CM | POA: Diagnosis not present

## 2016-07-10 DIAGNOSIS — J449 Chronic obstructive pulmonary disease, unspecified: Secondary | ICD-10-CM | POA: Diagnosis not present

## 2016-07-10 DIAGNOSIS — M6281 Muscle weakness (generalized): Secondary | ICD-10-CM | POA: Diagnosis not present

## 2016-07-10 DIAGNOSIS — K219 Gastro-esophageal reflux disease without esophagitis: Secondary | ICD-10-CM | POA: Diagnosis not present

## 2016-07-10 DIAGNOSIS — I2510 Atherosclerotic heart disease of native coronary artery without angina pectoris: Secondary | ICD-10-CM | POA: Diagnosis not present

## 2016-07-10 DIAGNOSIS — R41841 Cognitive communication deficit: Secondary | ICD-10-CM | POA: Diagnosis not present

## 2016-07-11 DIAGNOSIS — I2510 Atherosclerotic heart disease of native coronary artery without angina pectoris: Secondary | ICD-10-CM | POA: Diagnosis not present

## 2016-07-11 DIAGNOSIS — J449 Chronic obstructive pulmonary disease, unspecified: Secondary | ICD-10-CM | POA: Diagnosis not present

## 2016-07-11 DIAGNOSIS — M6281 Muscle weakness (generalized): Secondary | ICD-10-CM | POA: Diagnosis not present

## 2016-07-11 DIAGNOSIS — K219 Gastro-esophageal reflux disease without esophagitis: Secondary | ICD-10-CM | POA: Diagnosis not present

## 2016-07-11 DIAGNOSIS — R41841 Cognitive communication deficit: Secondary | ICD-10-CM | POA: Diagnosis not present

## 2016-07-12 DIAGNOSIS — I2510 Atherosclerotic heart disease of native coronary artery without angina pectoris: Secondary | ICD-10-CM | POA: Diagnosis not present

## 2016-07-12 DIAGNOSIS — K219 Gastro-esophageal reflux disease without esophagitis: Secondary | ICD-10-CM | POA: Diagnosis not present

## 2016-07-12 DIAGNOSIS — M6281 Muscle weakness (generalized): Secondary | ICD-10-CM | POA: Diagnosis not present

## 2016-07-12 DIAGNOSIS — J449 Chronic obstructive pulmonary disease, unspecified: Secondary | ICD-10-CM | POA: Diagnosis not present

## 2016-07-12 DIAGNOSIS — R41841 Cognitive communication deficit: Secondary | ICD-10-CM | POA: Diagnosis not present

## 2016-07-14 DIAGNOSIS — R41841 Cognitive communication deficit: Secondary | ICD-10-CM | POA: Diagnosis not present

## 2016-07-14 DIAGNOSIS — M6281 Muscle weakness (generalized): Secondary | ICD-10-CM | POA: Diagnosis not present

## 2016-07-14 DIAGNOSIS — K219 Gastro-esophageal reflux disease without esophagitis: Secondary | ICD-10-CM | POA: Diagnosis not present

## 2016-07-14 DIAGNOSIS — F419 Anxiety disorder, unspecified: Secondary | ICD-10-CM | POA: Diagnosis not present

## 2016-07-14 DIAGNOSIS — F5105 Insomnia due to other mental disorder: Secondary | ICD-10-CM | POA: Diagnosis not present

## 2016-07-14 DIAGNOSIS — F0281 Dementia in other diseases classified elsewhere with behavioral disturbance: Secondary | ICD-10-CM | POA: Diagnosis not present

## 2016-07-14 DIAGNOSIS — J449 Chronic obstructive pulmonary disease, unspecified: Secondary | ICD-10-CM | POA: Diagnosis not present

## 2016-07-14 DIAGNOSIS — I2510 Atherosclerotic heart disease of native coronary artery without angina pectoris: Secondary | ICD-10-CM | POA: Diagnosis not present

## 2016-07-15 DIAGNOSIS — K219 Gastro-esophageal reflux disease without esophagitis: Secondary | ICD-10-CM | POA: Diagnosis not present

## 2016-07-15 DIAGNOSIS — I2510 Atherosclerotic heart disease of native coronary artery without angina pectoris: Secondary | ICD-10-CM | POA: Diagnosis not present

## 2016-07-15 DIAGNOSIS — R41841 Cognitive communication deficit: Secondary | ICD-10-CM | POA: Diagnosis not present

## 2016-07-15 DIAGNOSIS — J449 Chronic obstructive pulmonary disease, unspecified: Secondary | ICD-10-CM | POA: Diagnosis not present

## 2016-07-15 DIAGNOSIS — M6281 Muscle weakness (generalized): Secondary | ICD-10-CM | POA: Diagnosis not present

## 2016-07-16 DIAGNOSIS — R41841 Cognitive communication deficit: Secondary | ICD-10-CM | POA: Diagnosis not present

## 2016-07-16 DIAGNOSIS — J449 Chronic obstructive pulmonary disease, unspecified: Secondary | ICD-10-CM | POA: Diagnosis not present

## 2016-07-16 DIAGNOSIS — K219 Gastro-esophageal reflux disease without esophagitis: Secondary | ICD-10-CM | POA: Diagnosis not present

## 2016-07-16 DIAGNOSIS — I2510 Atherosclerotic heart disease of native coronary artery without angina pectoris: Secondary | ICD-10-CM | POA: Diagnosis not present

## 2016-07-16 DIAGNOSIS — M6281 Muscle weakness (generalized): Secondary | ICD-10-CM | POA: Diagnosis not present

## 2016-07-17 DIAGNOSIS — I2510 Atherosclerotic heart disease of native coronary artery without angina pectoris: Secondary | ICD-10-CM | POA: Diagnosis not present

## 2016-07-17 DIAGNOSIS — J449 Chronic obstructive pulmonary disease, unspecified: Secondary | ICD-10-CM | POA: Diagnosis not present

## 2016-07-17 DIAGNOSIS — K219 Gastro-esophageal reflux disease without esophagitis: Secondary | ICD-10-CM | POA: Diagnosis not present

## 2016-07-17 DIAGNOSIS — M6281 Muscle weakness (generalized): Secondary | ICD-10-CM | POA: Diagnosis not present

## 2016-07-17 DIAGNOSIS — R41841 Cognitive communication deficit: Secondary | ICD-10-CM | POA: Diagnosis not present

## 2016-07-18 DIAGNOSIS — M6281 Muscle weakness (generalized): Secondary | ICD-10-CM | POA: Diagnosis not present

## 2016-07-18 DIAGNOSIS — R41841 Cognitive communication deficit: Secondary | ICD-10-CM | POA: Diagnosis not present

## 2016-07-18 DIAGNOSIS — I2510 Atherosclerotic heart disease of native coronary artery without angina pectoris: Secondary | ICD-10-CM | POA: Diagnosis not present

## 2016-07-18 DIAGNOSIS — K219 Gastro-esophageal reflux disease without esophagitis: Secondary | ICD-10-CM | POA: Diagnosis not present

## 2016-07-18 DIAGNOSIS — J449 Chronic obstructive pulmonary disease, unspecified: Secondary | ICD-10-CM | POA: Diagnosis not present

## 2016-07-19 DIAGNOSIS — K219 Gastro-esophageal reflux disease without esophagitis: Secondary | ICD-10-CM | POA: Diagnosis not present

## 2016-07-19 DIAGNOSIS — J449 Chronic obstructive pulmonary disease, unspecified: Secondary | ICD-10-CM | POA: Diagnosis not present

## 2016-07-19 DIAGNOSIS — R41841 Cognitive communication deficit: Secondary | ICD-10-CM | POA: Diagnosis not present

## 2016-07-19 DIAGNOSIS — M6281 Muscle weakness (generalized): Secondary | ICD-10-CM | POA: Diagnosis not present

## 2016-07-19 DIAGNOSIS — I2510 Atherosclerotic heart disease of native coronary artery without angina pectoris: Secondary | ICD-10-CM | POA: Diagnosis not present

## 2016-07-21 DIAGNOSIS — J449 Chronic obstructive pulmonary disease, unspecified: Secondary | ICD-10-CM | POA: Diagnosis not present

## 2016-07-21 DIAGNOSIS — I2510 Atherosclerotic heart disease of native coronary artery without angina pectoris: Secondary | ICD-10-CM | POA: Diagnosis not present

## 2016-07-21 DIAGNOSIS — R41841 Cognitive communication deficit: Secondary | ICD-10-CM | POA: Diagnosis not present

## 2016-07-21 DIAGNOSIS — M6281 Muscle weakness (generalized): Secondary | ICD-10-CM | POA: Diagnosis not present

## 2016-07-21 DIAGNOSIS — K219 Gastro-esophageal reflux disease without esophagitis: Secondary | ICD-10-CM | POA: Diagnosis not present

## 2016-07-22 DIAGNOSIS — I2510 Atherosclerotic heart disease of native coronary artery without angina pectoris: Secondary | ICD-10-CM | POA: Diagnosis not present

## 2016-07-22 DIAGNOSIS — R41841 Cognitive communication deficit: Secondary | ICD-10-CM | POA: Diagnosis not present

## 2016-07-22 DIAGNOSIS — J449 Chronic obstructive pulmonary disease, unspecified: Secondary | ICD-10-CM | POA: Diagnosis not present

## 2016-07-22 DIAGNOSIS — M6281 Muscle weakness (generalized): Secondary | ICD-10-CM | POA: Diagnosis not present

## 2016-07-22 DIAGNOSIS — K219 Gastro-esophageal reflux disease without esophagitis: Secondary | ICD-10-CM | POA: Diagnosis not present

## 2016-07-23 DIAGNOSIS — J449 Chronic obstructive pulmonary disease, unspecified: Secondary | ICD-10-CM | POA: Diagnosis not present

## 2016-07-23 DIAGNOSIS — I2510 Atherosclerotic heart disease of native coronary artery without angina pectoris: Secondary | ICD-10-CM | POA: Diagnosis not present

## 2016-07-23 DIAGNOSIS — R41841 Cognitive communication deficit: Secondary | ICD-10-CM | POA: Diagnosis not present

## 2016-07-23 DIAGNOSIS — M6281 Muscle weakness (generalized): Secondary | ICD-10-CM | POA: Diagnosis not present

## 2016-07-23 DIAGNOSIS — K219 Gastro-esophageal reflux disease without esophagitis: Secondary | ICD-10-CM | POA: Diagnosis not present

## 2016-07-24 DIAGNOSIS — I2510 Atherosclerotic heart disease of native coronary artery without angina pectoris: Secondary | ICD-10-CM | POA: Diagnosis not present

## 2016-07-24 DIAGNOSIS — R41841 Cognitive communication deficit: Secondary | ICD-10-CM | POA: Diagnosis not present

## 2016-07-24 DIAGNOSIS — K219 Gastro-esophageal reflux disease without esophagitis: Secondary | ICD-10-CM | POA: Diagnosis not present

## 2016-07-24 DIAGNOSIS — J449 Chronic obstructive pulmonary disease, unspecified: Secondary | ICD-10-CM | POA: Diagnosis not present

## 2016-07-24 DIAGNOSIS — M6281 Muscle weakness (generalized): Secondary | ICD-10-CM | POA: Diagnosis not present

## 2016-07-25 DIAGNOSIS — M6281 Muscle weakness (generalized): Secondary | ICD-10-CM | POA: Diagnosis not present

## 2016-07-25 DIAGNOSIS — I2510 Atherosclerotic heart disease of native coronary artery without angina pectoris: Secondary | ICD-10-CM | POA: Diagnosis not present

## 2016-07-25 DIAGNOSIS — J449 Chronic obstructive pulmonary disease, unspecified: Secondary | ICD-10-CM | POA: Diagnosis not present

## 2016-07-25 DIAGNOSIS — K219 Gastro-esophageal reflux disease without esophagitis: Secondary | ICD-10-CM | POA: Diagnosis not present

## 2016-07-25 DIAGNOSIS — R41841 Cognitive communication deficit: Secondary | ICD-10-CM | POA: Diagnosis not present

## 2016-07-27 DIAGNOSIS — J449 Chronic obstructive pulmonary disease, unspecified: Secondary | ICD-10-CM | POA: Diagnosis not present

## 2016-07-27 DIAGNOSIS — K219 Gastro-esophageal reflux disease without esophagitis: Secondary | ICD-10-CM | POA: Diagnosis not present

## 2016-07-27 DIAGNOSIS — M6281 Muscle weakness (generalized): Secondary | ICD-10-CM | POA: Diagnosis not present

## 2016-07-27 DIAGNOSIS — R41841 Cognitive communication deficit: Secondary | ICD-10-CM | POA: Diagnosis not present

## 2016-07-27 DIAGNOSIS — I2510 Atherosclerotic heart disease of native coronary artery without angina pectoris: Secondary | ICD-10-CM | POA: Diagnosis not present

## 2016-07-28 DIAGNOSIS — J449 Chronic obstructive pulmonary disease, unspecified: Secondary | ICD-10-CM | POA: Diagnosis not present

## 2016-07-28 DIAGNOSIS — M6281 Muscle weakness (generalized): Secondary | ICD-10-CM | POA: Diagnosis not present

## 2016-07-28 DIAGNOSIS — K219 Gastro-esophageal reflux disease without esophagitis: Secondary | ICD-10-CM | POA: Diagnosis not present

## 2016-07-28 DIAGNOSIS — I2510 Atherosclerotic heart disease of native coronary artery without angina pectoris: Secondary | ICD-10-CM | POA: Diagnosis not present

## 2016-07-28 DIAGNOSIS — R41841 Cognitive communication deficit: Secondary | ICD-10-CM | POA: Diagnosis not present

## 2016-07-29 DIAGNOSIS — J449 Chronic obstructive pulmonary disease, unspecified: Secondary | ICD-10-CM | POA: Diagnosis not present

## 2016-07-29 DIAGNOSIS — M6281 Muscle weakness (generalized): Secondary | ICD-10-CM | POA: Diagnosis not present

## 2016-07-29 DIAGNOSIS — R41841 Cognitive communication deficit: Secondary | ICD-10-CM | POA: Diagnosis not present

## 2016-07-29 DIAGNOSIS — I2510 Atherosclerotic heart disease of native coronary artery without angina pectoris: Secondary | ICD-10-CM | POA: Diagnosis not present

## 2016-07-29 DIAGNOSIS — K219 Gastro-esophageal reflux disease without esophagitis: Secondary | ICD-10-CM | POA: Diagnosis not present

## 2016-07-30 DIAGNOSIS — J449 Chronic obstructive pulmonary disease, unspecified: Secondary | ICD-10-CM | POA: Diagnosis not present

## 2016-07-30 DIAGNOSIS — K219 Gastro-esophageal reflux disease without esophagitis: Secondary | ICD-10-CM | POA: Diagnosis not present

## 2016-07-30 DIAGNOSIS — I2510 Atherosclerotic heart disease of native coronary artery without angina pectoris: Secondary | ICD-10-CM | POA: Diagnosis not present

## 2016-07-30 DIAGNOSIS — M6281 Muscle weakness (generalized): Secondary | ICD-10-CM | POA: Diagnosis not present

## 2016-07-30 DIAGNOSIS — R41841 Cognitive communication deficit: Secondary | ICD-10-CM | POA: Diagnosis not present

## 2016-07-31 DIAGNOSIS — R41841 Cognitive communication deficit: Secondary | ICD-10-CM | POA: Diagnosis not present

## 2016-07-31 DIAGNOSIS — K219 Gastro-esophageal reflux disease without esophagitis: Secondary | ICD-10-CM | POA: Diagnosis not present

## 2016-07-31 DIAGNOSIS — J449 Chronic obstructive pulmonary disease, unspecified: Secondary | ICD-10-CM | POA: Diagnosis not present

## 2016-07-31 DIAGNOSIS — M6281 Muscle weakness (generalized): Secondary | ICD-10-CM | POA: Diagnosis not present

## 2016-07-31 DIAGNOSIS — I2510 Atherosclerotic heart disease of native coronary artery without angina pectoris: Secondary | ICD-10-CM | POA: Diagnosis not present

## 2016-08-01 DIAGNOSIS — M6281 Muscle weakness (generalized): Secondary | ICD-10-CM | POA: Diagnosis not present

## 2016-08-01 DIAGNOSIS — I2510 Atherosclerotic heart disease of native coronary artery without angina pectoris: Secondary | ICD-10-CM | POA: Diagnosis not present

## 2016-08-01 DIAGNOSIS — R41841 Cognitive communication deficit: Secondary | ICD-10-CM | POA: Diagnosis not present

## 2016-08-01 DIAGNOSIS — J449 Chronic obstructive pulmonary disease, unspecified: Secondary | ICD-10-CM | POA: Diagnosis not present

## 2016-08-01 DIAGNOSIS — K219 Gastro-esophageal reflux disease without esophagitis: Secondary | ICD-10-CM | POA: Diagnosis not present

## 2016-08-02 DIAGNOSIS — I10 Essential (primary) hypertension: Secondary | ICD-10-CM | POA: Diagnosis not present

## 2016-08-02 DIAGNOSIS — M25552 Pain in left hip: Secondary | ICD-10-CM | POA: Diagnosis not present

## 2016-08-02 DIAGNOSIS — N182 Chronic kidney disease, stage 2 (mild): Secondary | ICD-10-CM | POA: Diagnosis not present

## 2016-08-02 DIAGNOSIS — I259 Chronic ischemic heart disease, unspecified: Secondary | ICD-10-CM | POA: Diagnosis not present

## 2016-08-02 DIAGNOSIS — J449 Chronic obstructive pulmonary disease, unspecified: Secondary | ICD-10-CM | POA: Diagnosis not present

## 2016-08-02 DIAGNOSIS — M545 Low back pain: Secondary | ICD-10-CM | POA: Diagnosis not present

## 2016-08-02 DIAGNOSIS — R531 Weakness: Secondary | ICD-10-CM | POA: Diagnosis not present

## 2016-08-04 DIAGNOSIS — K219 Gastro-esophageal reflux disease without esophagitis: Secondary | ICD-10-CM | POA: Diagnosis not present

## 2016-08-04 DIAGNOSIS — M6281 Muscle weakness (generalized): Secondary | ICD-10-CM | POA: Diagnosis not present

## 2016-08-04 DIAGNOSIS — I2510 Atherosclerotic heart disease of native coronary artery without angina pectoris: Secondary | ICD-10-CM | POA: Diagnosis not present

## 2016-08-04 DIAGNOSIS — R41841 Cognitive communication deficit: Secondary | ICD-10-CM | POA: Diagnosis not present

## 2016-08-04 DIAGNOSIS — J449 Chronic obstructive pulmonary disease, unspecified: Secondary | ICD-10-CM | POA: Diagnosis not present

## 2016-08-05 DIAGNOSIS — M6281 Muscle weakness (generalized): Secondary | ICD-10-CM | POA: Diagnosis not present

## 2016-08-05 DIAGNOSIS — K219 Gastro-esophageal reflux disease without esophagitis: Secondary | ICD-10-CM | POA: Diagnosis not present

## 2016-08-05 DIAGNOSIS — R41841 Cognitive communication deficit: Secondary | ICD-10-CM | POA: Diagnosis not present

## 2016-08-05 DIAGNOSIS — J449 Chronic obstructive pulmonary disease, unspecified: Secondary | ICD-10-CM | POA: Diagnosis not present

## 2016-08-05 DIAGNOSIS — I2510 Atherosclerotic heart disease of native coronary artery without angina pectoris: Secondary | ICD-10-CM | POA: Diagnosis not present

## 2016-08-06 DIAGNOSIS — J449 Chronic obstructive pulmonary disease, unspecified: Secondary | ICD-10-CM | POA: Diagnosis not present

## 2016-08-06 DIAGNOSIS — R41841 Cognitive communication deficit: Secondary | ICD-10-CM | POA: Diagnosis not present

## 2016-08-06 DIAGNOSIS — M6281 Muscle weakness (generalized): Secondary | ICD-10-CM | POA: Diagnosis not present

## 2016-08-06 DIAGNOSIS — K219 Gastro-esophageal reflux disease without esophagitis: Secondary | ICD-10-CM | POA: Diagnosis not present

## 2016-08-06 DIAGNOSIS — I2510 Atherosclerotic heart disease of native coronary artery without angina pectoris: Secondary | ICD-10-CM | POA: Diagnosis not present

## 2016-08-07 DIAGNOSIS — R41841 Cognitive communication deficit: Secondary | ICD-10-CM | POA: Diagnosis not present

## 2016-08-07 DIAGNOSIS — J449 Chronic obstructive pulmonary disease, unspecified: Secondary | ICD-10-CM | POA: Diagnosis not present

## 2016-08-07 DIAGNOSIS — I2510 Atherosclerotic heart disease of native coronary artery without angina pectoris: Secondary | ICD-10-CM | POA: Diagnosis not present

## 2016-08-07 DIAGNOSIS — K219 Gastro-esophageal reflux disease without esophagitis: Secondary | ICD-10-CM | POA: Diagnosis not present

## 2016-08-07 DIAGNOSIS — M6281 Muscle weakness (generalized): Secondary | ICD-10-CM | POA: Diagnosis not present

## 2016-08-08 DIAGNOSIS — K219 Gastro-esophageal reflux disease without esophagitis: Secondary | ICD-10-CM | POA: Diagnosis not present

## 2016-08-08 DIAGNOSIS — M6281 Muscle weakness (generalized): Secondary | ICD-10-CM | POA: Diagnosis not present

## 2016-08-08 DIAGNOSIS — I2510 Atherosclerotic heart disease of native coronary artery without angina pectoris: Secondary | ICD-10-CM | POA: Diagnosis not present

## 2016-08-08 DIAGNOSIS — J449 Chronic obstructive pulmonary disease, unspecified: Secondary | ICD-10-CM | POA: Diagnosis not present

## 2016-08-08 DIAGNOSIS — R41841 Cognitive communication deficit: Secondary | ICD-10-CM | POA: Diagnosis not present

## 2016-08-09 DIAGNOSIS — K219 Gastro-esophageal reflux disease without esophagitis: Secondary | ICD-10-CM | POA: Diagnosis not present

## 2016-08-09 DIAGNOSIS — J449 Chronic obstructive pulmonary disease, unspecified: Secondary | ICD-10-CM | POA: Diagnosis not present

## 2016-08-09 DIAGNOSIS — R41841 Cognitive communication deficit: Secondary | ICD-10-CM | POA: Diagnosis not present

## 2016-08-09 DIAGNOSIS — M6281 Muscle weakness (generalized): Secondary | ICD-10-CM | POA: Diagnosis not present

## 2016-08-09 DIAGNOSIS — I2510 Atherosclerotic heart disease of native coronary artery without angina pectoris: Secondary | ICD-10-CM | POA: Diagnosis not present

## 2016-08-11 DIAGNOSIS — R41841 Cognitive communication deficit: Secondary | ICD-10-CM | POA: Diagnosis not present

## 2016-08-11 DIAGNOSIS — J449 Chronic obstructive pulmonary disease, unspecified: Secondary | ICD-10-CM | POA: Diagnosis not present

## 2016-08-11 DIAGNOSIS — K219 Gastro-esophageal reflux disease without esophagitis: Secondary | ICD-10-CM | POA: Diagnosis not present

## 2016-08-11 DIAGNOSIS — M6281 Muscle weakness (generalized): Secondary | ICD-10-CM | POA: Diagnosis not present

## 2016-08-11 DIAGNOSIS — I2510 Atherosclerotic heart disease of native coronary artery without angina pectoris: Secondary | ICD-10-CM | POA: Diagnosis not present

## 2016-08-12 DIAGNOSIS — R1084 Generalized abdominal pain: Secondary | ICD-10-CM | POA: Diagnosis not present

## 2016-08-12 DIAGNOSIS — J449 Chronic obstructive pulmonary disease, unspecified: Secondary | ICD-10-CM | POA: Diagnosis not present

## 2016-08-12 DIAGNOSIS — I2510 Atherosclerotic heart disease of native coronary artery without angina pectoris: Secondary | ICD-10-CM | POA: Diagnosis not present

## 2016-08-12 DIAGNOSIS — K219 Gastro-esophageal reflux disease without esophagitis: Secondary | ICD-10-CM | POA: Diagnosis not present

## 2016-08-12 DIAGNOSIS — R41841 Cognitive communication deficit: Secondary | ICD-10-CM | POA: Diagnosis not present

## 2016-08-12 DIAGNOSIS — M6281 Muscle weakness (generalized): Secondary | ICD-10-CM | POA: Diagnosis not present

## 2016-08-13 DIAGNOSIS — N390 Urinary tract infection, site not specified: Secondary | ICD-10-CM | POA: Diagnosis not present

## 2016-08-13 DIAGNOSIS — K219 Gastro-esophageal reflux disease without esophagitis: Secondary | ICD-10-CM | POA: Diagnosis not present

## 2016-08-13 DIAGNOSIS — M6281 Muscle weakness (generalized): Secondary | ICD-10-CM | POA: Diagnosis not present

## 2016-08-13 DIAGNOSIS — J449 Chronic obstructive pulmonary disease, unspecified: Secondary | ICD-10-CM | POA: Diagnosis not present

## 2016-08-13 DIAGNOSIS — R41841 Cognitive communication deficit: Secondary | ICD-10-CM | POA: Diagnosis not present

## 2016-08-13 DIAGNOSIS — I2510 Atherosclerotic heart disease of native coronary artery without angina pectoris: Secondary | ICD-10-CM | POA: Diagnosis not present

## 2016-08-14 ENCOUNTER — Other Ambulatory Visit (HOSPITAL_COMMUNITY): Payer: Self-pay | Admitting: Internal Medicine

## 2016-08-14 DIAGNOSIS — K219 Gastro-esophageal reflux disease without esophagitis: Secondary | ICD-10-CM | POA: Diagnosis not present

## 2016-08-14 DIAGNOSIS — M6281 Muscle weakness (generalized): Secondary | ICD-10-CM | POA: Diagnosis not present

## 2016-08-14 DIAGNOSIS — R41841 Cognitive communication deficit: Secondary | ICD-10-CM | POA: Diagnosis not present

## 2016-08-14 DIAGNOSIS — J449 Chronic obstructive pulmonary disease, unspecified: Secondary | ICD-10-CM | POA: Diagnosis not present

## 2016-08-14 DIAGNOSIS — I2510 Atherosclerotic heart disease of native coronary artery without angina pectoris: Secondary | ICD-10-CM | POA: Diagnosis not present

## 2016-08-15 DIAGNOSIS — R41841 Cognitive communication deficit: Secondary | ICD-10-CM | POA: Diagnosis not present

## 2016-08-15 DIAGNOSIS — I2510 Atherosclerotic heart disease of native coronary artery without angina pectoris: Secondary | ICD-10-CM | POA: Diagnosis not present

## 2016-08-15 DIAGNOSIS — J449 Chronic obstructive pulmonary disease, unspecified: Secondary | ICD-10-CM | POA: Diagnosis not present

## 2016-08-15 DIAGNOSIS — M6281 Muscle weakness (generalized): Secondary | ICD-10-CM | POA: Diagnosis not present

## 2016-08-15 DIAGNOSIS — K219 Gastro-esophageal reflux disease without esophagitis: Secondary | ICD-10-CM | POA: Diagnosis not present

## 2016-08-16 DIAGNOSIS — I2510 Atherosclerotic heart disease of native coronary artery without angina pectoris: Secondary | ICD-10-CM | POA: Diagnosis not present

## 2016-08-16 DIAGNOSIS — M6281 Muscle weakness (generalized): Secondary | ICD-10-CM | POA: Diagnosis not present

## 2016-08-16 DIAGNOSIS — J449 Chronic obstructive pulmonary disease, unspecified: Secondary | ICD-10-CM | POA: Diagnosis not present

## 2016-08-16 DIAGNOSIS — R41841 Cognitive communication deficit: Secondary | ICD-10-CM | POA: Diagnosis not present

## 2016-08-16 DIAGNOSIS — K219 Gastro-esophageal reflux disease without esophagitis: Secondary | ICD-10-CM | POA: Diagnosis not present

## 2016-08-17 DIAGNOSIS — J449 Chronic obstructive pulmonary disease, unspecified: Secondary | ICD-10-CM | POA: Diagnosis not present

## 2016-08-17 DIAGNOSIS — R41841 Cognitive communication deficit: Secondary | ICD-10-CM | POA: Diagnosis not present

## 2016-08-17 DIAGNOSIS — M6281 Muscle weakness (generalized): Secondary | ICD-10-CM | POA: Diagnosis not present

## 2016-08-17 DIAGNOSIS — K219 Gastro-esophageal reflux disease without esophagitis: Secondary | ICD-10-CM | POA: Diagnosis not present

## 2016-08-17 DIAGNOSIS — I2510 Atherosclerotic heart disease of native coronary artery without angina pectoris: Secondary | ICD-10-CM | POA: Diagnosis not present

## 2016-08-18 ENCOUNTER — Other Ambulatory Visit (HOSPITAL_COMMUNITY): Payer: Self-pay | Admitting: Internal Medicine

## 2016-08-18 DIAGNOSIS — K219 Gastro-esophageal reflux disease without esophagitis: Secondary | ICD-10-CM | POA: Diagnosis not present

## 2016-08-18 DIAGNOSIS — M6281 Muscle weakness (generalized): Secondary | ICD-10-CM | POA: Diagnosis not present

## 2016-08-18 DIAGNOSIS — I2510 Atherosclerotic heart disease of native coronary artery without angina pectoris: Secondary | ICD-10-CM | POA: Diagnosis not present

## 2016-08-18 DIAGNOSIS — J449 Chronic obstructive pulmonary disease, unspecified: Secondary | ICD-10-CM | POA: Diagnosis not present

## 2016-08-18 DIAGNOSIS — R1084 Generalized abdominal pain: Secondary | ICD-10-CM

## 2016-08-18 DIAGNOSIS — R41841 Cognitive communication deficit: Secondary | ICD-10-CM | POA: Diagnosis not present

## 2016-08-19 DIAGNOSIS — R41841 Cognitive communication deficit: Secondary | ICD-10-CM | POA: Diagnosis not present

## 2016-08-19 DIAGNOSIS — I2510 Atherosclerotic heart disease of native coronary artery without angina pectoris: Secondary | ICD-10-CM | POA: Diagnosis not present

## 2016-08-19 DIAGNOSIS — M6281 Muscle weakness (generalized): Secondary | ICD-10-CM | POA: Diagnosis not present

## 2016-08-19 DIAGNOSIS — J449 Chronic obstructive pulmonary disease, unspecified: Secondary | ICD-10-CM | POA: Diagnosis not present

## 2016-08-19 DIAGNOSIS — Z79899 Other long term (current) drug therapy: Secondary | ICD-10-CM | POA: Diagnosis not present

## 2016-08-19 DIAGNOSIS — K219 Gastro-esophageal reflux disease without esophagitis: Secondary | ICD-10-CM | POA: Diagnosis not present

## 2016-08-20 DIAGNOSIS — M6281 Muscle weakness (generalized): Secondary | ICD-10-CM | POA: Diagnosis not present

## 2016-08-20 DIAGNOSIS — I2510 Atherosclerotic heart disease of native coronary artery without angina pectoris: Secondary | ICD-10-CM | POA: Diagnosis not present

## 2016-08-20 DIAGNOSIS — J449 Chronic obstructive pulmonary disease, unspecified: Secondary | ICD-10-CM | POA: Diagnosis not present

## 2016-08-20 DIAGNOSIS — K219 Gastro-esophageal reflux disease without esophagitis: Secondary | ICD-10-CM | POA: Diagnosis not present

## 2016-08-20 DIAGNOSIS — R41841 Cognitive communication deficit: Secondary | ICD-10-CM | POA: Diagnosis not present

## 2016-08-21 DIAGNOSIS — M6281 Muscle weakness (generalized): Secondary | ICD-10-CM | POA: Diagnosis not present

## 2016-08-21 DIAGNOSIS — R41841 Cognitive communication deficit: Secondary | ICD-10-CM | POA: Diagnosis not present

## 2016-08-21 DIAGNOSIS — J449 Chronic obstructive pulmonary disease, unspecified: Secondary | ICD-10-CM | POA: Diagnosis not present

## 2016-08-21 DIAGNOSIS — I2510 Atherosclerotic heart disease of native coronary artery without angina pectoris: Secondary | ICD-10-CM | POA: Diagnosis not present

## 2016-08-21 DIAGNOSIS — K219 Gastro-esophageal reflux disease without esophagitis: Secondary | ICD-10-CM | POA: Diagnosis not present

## 2016-08-22 DIAGNOSIS — J449 Chronic obstructive pulmonary disease, unspecified: Secondary | ICD-10-CM | POA: Diagnosis not present

## 2016-08-23 DIAGNOSIS — J449 Chronic obstructive pulmonary disease, unspecified: Secondary | ICD-10-CM | POA: Diagnosis not present

## 2016-08-25 DIAGNOSIS — F419 Anxiety disorder, unspecified: Secondary | ICD-10-CM | POA: Diagnosis not present

## 2016-08-25 DIAGNOSIS — J449 Chronic obstructive pulmonary disease, unspecified: Secondary | ICD-10-CM | POA: Diagnosis not present

## 2016-08-25 DIAGNOSIS — F0281 Dementia in other diseases classified elsewhere with behavioral disturbance: Secondary | ICD-10-CM | POA: Diagnosis not present

## 2016-08-25 DIAGNOSIS — F5105 Insomnia due to other mental disorder: Secondary | ICD-10-CM | POA: Diagnosis not present

## 2016-08-26 DIAGNOSIS — E119 Type 2 diabetes mellitus without complications: Secondary | ICD-10-CM | POA: Diagnosis not present

## 2016-08-26 DIAGNOSIS — J449 Chronic obstructive pulmonary disease, unspecified: Secondary | ICD-10-CM | POA: Diagnosis not present

## 2016-08-26 DIAGNOSIS — I259 Chronic ischemic heart disease, unspecified: Secondary | ICD-10-CM | POA: Diagnosis not present

## 2016-08-26 DIAGNOSIS — R531 Weakness: Secondary | ICD-10-CM | POA: Diagnosis not present

## 2016-08-26 DIAGNOSIS — I10 Essential (primary) hypertension: Secondary | ICD-10-CM | POA: Diagnosis not present

## 2016-08-27 ENCOUNTER — Ambulatory Visit (HOSPITAL_COMMUNITY)
Admission: RE | Admit: 2016-08-27 | Discharge: 2016-08-27 | Disposition: A | Discharge status: Home / Self Care | Payer: Self-pay | Source: Ambulatory Visit | Attending: Internal Medicine | Admitting: Internal Medicine

## 2016-08-27 DIAGNOSIS — J449 Chronic obstructive pulmonary disease, unspecified: Secondary | ICD-10-CM | POA: Diagnosis not present

## 2016-08-28 ENCOUNTER — Ambulatory Visit (HOSPITAL_COMMUNITY)
Admission: RE | Admit: 2016-08-28 | Discharge: 2016-08-28 | Disposition: A | Discharge status: Home / Self Care | Payer: Medicare Other | Source: Ambulatory Visit | Attending: Internal Medicine | Admitting: Internal Medicine

## 2016-08-28 DIAGNOSIS — M879 Osteonecrosis, unspecified: Secondary | ICD-10-CM | POA: Diagnosis not present

## 2016-08-28 DIAGNOSIS — K8020 Calculus of gallbladder without cholecystitis without obstruction: Secondary | ICD-10-CM | POA: Diagnosis not present

## 2016-08-28 DIAGNOSIS — R1084 Generalized abdominal pain: Secondary | ICD-10-CM | POA: Insufficient documentation

## 2016-08-28 DIAGNOSIS — I700 Atherosclerosis of aorta: Secondary | ICD-10-CM | POA: Diagnosis not present

## 2016-08-28 DIAGNOSIS — I2510 Atherosclerotic heart disease of native coronary artery without angina pectoris: Secondary | ICD-10-CM | POA: Insufficient documentation

## 2016-08-28 DIAGNOSIS — K861 Other chronic pancreatitis: Secondary | ICD-10-CM | POA: Insufficient documentation

## 2016-08-28 DIAGNOSIS — I714 Abdominal aortic aneurysm, without rupture: Secondary | ICD-10-CM | POA: Diagnosis not present

## 2016-08-28 DIAGNOSIS — J449 Chronic obstructive pulmonary disease, unspecified: Secondary | ICD-10-CM | POA: Diagnosis not present

## 2016-08-28 LAB — POCT I-STAT CREATININE: CREATININE: 1.2 mg/dL — AB (ref 0.44–1.00)

## 2016-08-28 MED ORDER — IOPAMIDOL (ISOVUE-300) INJECTION 61%
80.0000 mL | Freq: Once | INTRAVENOUS | Status: AC | PRN
  Administered 2016-08-28: 80 mL via INTRAVENOUS

## 2016-08-29 DIAGNOSIS — Z79899 Other long term (current) drug therapy: Secondary | ICD-10-CM | POA: Diagnosis not present

## 2016-08-29 DIAGNOSIS — J449 Chronic obstructive pulmonary disease, unspecified: Secondary | ICD-10-CM | POA: Diagnosis not present

## 2016-09-01 DIAGNOSIS — J449 Chronic obstructive pulmonary disease, unspecified: Secondary | ICD-10-CM | POA: Diagnosis not present

## 2016-09-02 DIAGNOSIS — J449 Chronic obstructive pulmonary disease, unspecified: Secondary | ICD-10-CM | POA: Diagnosis not present

## 2016-09-03 DIAGNOSIS — J449 Chronic obstructive pulmonary disease, unspecified: Secondary | ICD-10-CM | POA: Diagnosis not present

## 2016-09-04 DIAGNOSIS — J449 Chronic obstructive pulmonary disease, unspecified: Secondary | ICD-10-CM | POA: Diagnosis not present

## 2016-09-05 DIAGNOSIS — J449 Chronic obstructive pulmonary disease, unspecified: Secondary | ICD-10-CM | POA: Diagnosis not present

## 2016-09-06 DIAGNOSIS — J449 Chronic obstructive pulmonary disease, unspecified: Secondary | ICD-10-CM | POA: Diagnosis not present

## 2016-09-08 DIAGNOSIS — J449 Chronic obstructive pulmonary disease, unspecified: Secondary | ICD-10-CM | POA: Diagnosis not present

## 2016-09-08 DIAGNOSIS — F419 Anxiety disorder, unspecified: Secondary | ICD-10-CM | POA: Diagnosis not present

## 2016-09-08 DIAGNOSIS — F0281 Dementia in other diseases classified elsewhere with behavioral disturbance: Secondary | ICD-10-CM | POA: Diagnosis not present

## 2016-09-08 DIAGNOSIS — F5105 Insomnia due to other mental disorder: Secondary | ICD-10-CM | POA: Diagnosis not present

## 2016-09-09 DIAGNOSIS — J449 Chronic obstructive pulmonary disease, unspecified: Secondary | ICD-10-CM | POA: Diagnosis not present

## 2016-09-09 DIAGNOSIS — R531 Weakness: Secondary | ICD-10-CM | POA: Diagnosis not present

## 2016-09-09 DIAGNOSIS — I259 Chronic ischemic heart disease, unspecified: Secondary | ICD-10-CM | POA: Diagnosis not present

## 2016-09-09 DIAGNOSIS — E119 Type 2 diabetes mellitus without complications: Secondary | ICD-10-CM | POA: Diagnosis not present

## 2016-09-09 DIAGNOSIS — I10 Essential (primary) hypertension: Secondary | ICD-10-CM | POA: Diagnosis not present

## 2016-09-10 DIAGNOSIS — J449 Chronic obstructive pulmonary disease, unspecified: Secondary | ICD-10-CM | POA: Diagnosis not present

## 2016-09-11 DIAGNOSIS — J449 Chronic obstructive pulmonary disease, unspecified: Secondary | ICD-10-CM | POA: Diagnosis not present

## 2016-09-12 DIAGNOSIS — J449 Chronic obstructive pulmonary disease, unspecified: Secondary | ICD-10-CM | POA: Diagnosis not present

## 2016-09-13 DIAGNOSIS — J449 Chronic obstructive pulmonary disease, unspecified: Secondary | ICD-10-CM | POA: Diagnosis not present

## 2016-09-14 DIAGNOSIS — J449 Chronic obstructive pulmonary disease, unspecified: Secondary | ICD-10-CM | POA: Diagnosis not present

## 2016-09-15 ENCOUNTER — Other Ambulatory Visit (HOSPITAL_COMMUNITY): Payer: Self-pay | Admitting: Internal Medicine

## 2016-09-15 DIAGNOSIS — F419 Anxiety disorder, unspecified: Secondary | ICD-10-CM | POA: Diagnosis not present

## 2016-09-15 DIAGNOSIS — R4689 Other symptoms and signs involving appearance and behavior: Secondary | ICD-10-CM

## 2016-09-15 DIAGNOSIS — F5105 Insomnia due to other mental disorder: Secondary | ICD-10-CM | POA: Diagnosis not present

## 2016-09-15 DIAGNOSIS — F0281 Dementia in other diseases classified elsewhere with behavioral disturbance: Secondary | ICD-10-CM | POA: Diagnosis not present

## 2016-09-15 DIAGNOSIS — R443 Hallucinations, unspecified: Secondary | ICD-10-CM

## 2016-09-15 DIAGNOSIS — Z79899 Other long term (current) drug therapy: Secondary | ICD-10-CM | POA: Diagnosis not present

## 2016-09-15 DIAGNOSIS — J449 Chronic obstructive pulmonary disease, unspecified: Secondary | ICD-10-CM | POA: Diagnosis not present

## 2016-09-16 DIAGNOSIS — Z79899 Other long term (current) drug therapy: Secondary | ICD-10-CM | POA: Diagnosis not present

## 2016-09-16 DIAGNOSIS — I10 Essential (primary) hypertension: Secondary | ICD-10-CM | POA: Diagnosis not present

## 2016-09-16 DIAGNOSIS — Z23 Encounter for immunization: Secondary | ICD-10-CM | POA: Diagnosis not present

## 2016-09-16 DIAGNOSIS — Z886 Allergy status to analgesic agent status: Secondary | ICD-10-CM | POA: Diagnosis not present

## 2016-09-16 DIAGNOSIS — S61412A Laceration without foreign body of left hand, initial encounter: Secondary | ICD-10-CM | POA: Diagnosis not present

## 2016-09-16 DIAGNOSIS — W228XXA Striking against or struck by other objects, initial encounter: Secondary | ICD-10-CM | POA: Diagnosis not present

## 2016-09-16 DIAGNOSIS — I6789 Other cerebrovascular disease: Secondary | ICD-10-CM | POA: Diagnosis not present

## 2016-09-16 DIAGNOSIS — J449 Chronic obstructive pulmonary disease, unspecified: Secondary | ICD-10-CM | POA: Diagnosis not present

## 2016-09-16 DIAGNOSIS — I2510 Atherosclerotic heart disease of native coronary artery without angina pectoris: Secondary | ICD-10-CM | POA: Diagnosis not present

## 2016-09-16 DIAGNOSIS — S61402A Unspecified open wound of left hand, initial encounter: Secondary | ICD-10-CM | POA: Diagnosis not present

## 2016-09-16 DIAGNOSIS — F0281 Dementia in other diseases classified elsewhere with behavioral disturbance: Secondary | ICD-10-CM | POA: Diagnosis not present

## 2016-09-16 DIAGNOSIS — F4489 Other dissociative and conversion disorders: Secondary | ICD-10-CM | POA: Diagnosis not present

## 2016-09-17 DIAGNOSIS — Z743 Need for continuous supervision: Secondary | ICD-10-CM | POA: Diagnosis not present

## 2016-09-17 DIAGNOSIS — R279 Unspecified lack of coordination: Secondary | ICD-10-CM | POA: Diagnosis not present

## 2016-09-17 DIAGNOSIS — J449 Chronic obstructive pulmonary disease, unspecified: Secondary | ICD-10-CM | POA: Diagnosis not present

## 2016-09-18 ENCOUNTER — Ambulatory Visit (INDEPENDENT_AMBULATORY_CARE_PROVIDER_SITE_OTHER): Payer: Medicare Other | Admitting: Otolaryngology

## 2016-09-18 DIAGNOSIS — J449 Chronic obstructive pulmonary disease, unspecified: Secondary | ICD-10-CM | POA: Diagnosis not present

## 2016-09-19 DIAGNOSIS — J449 Chronic obstructive pulmonary disease, unspecified: Secondary | ICD-10-CM | POA: Diagnosis not present

## 2016-09-21 DIAGNOSIS — F5105 Insomnia due to other mental disorder: Secondary | ICD-10-CM | POA: Diagnosis not present

## 2016-09-21 DIAGNOSIS — F419 Anxiety disorder, unspecified: Secondary | ICD-10-CM | POA: Diagnosis not present

## 2016-09-21 DIAGNOSIS — F0281 Dementia in other diseases classified elsewhere with behavioral disturbance: Secondary | ICD-10-CM | POA: Diagnosis not present

## 2016-09-22 DIAGNOSIS — F5105 Insomnia due to other mental disorder: Secondary | ICD-10-CM | POA: Diagnosis not present

## 2016-09-22 DIAGNOSIS — J449 Chronic obstructive pulmonary disease, unspecified: Secondary | ICD-10-CM | POA: Diagnosis not present

## 2016-09-22 DIAGNOSIS — F0281 Dementia in other diseases classified elsewhere with behavioral disturbance: Secondary | ICD-10-CM | POA: Diagnosis not present

## 2016-09-22 DIAGNOSIS — F419 Anxiety disorder, unspecified: Secondary | ICD-10-CM | POA: Diagnosis not present

## 2016-09-23 DIAGNOSIS — J449 Chronic obstructive pulmonary disease, unspecified: Secondary | ICD-10-CM | POA: Diagnosis not present

## 2016-09-24 DIAGNOSIS — J449 Chronic obstructive pulmonary disease, unspecified: Secondary | ICD-10-CM | POA: Diagnosis not present

## 2016-09-25 DIAGNOSIS — J449 Chronic obstructive pulmonary disease, unspecified: Secondary | ICD-10-CM | POA: Diagnosis not present

## 2016-09-26 DIAGNOSIS — J449 Chronic obstructive pulmonary disease, unspecified: Secondary | ICD-10-CM | POA: Diagnosis not present

## 2016-09-29 DIAGNOSIS — F0281 Dementia in other diseases classified elsewhere with behavioral disturbance: Secondary | ICD-10-CM | POA: Diagnosis not present

## 2016-09-29 DIAGNOSIS — F419 Anxiety disorder, unspecified: Secondary | ICD-10-CM | POA: Diagnosis not present

## 2016-09-29 DIAGNOSIS — F5105 Insomnia due to other mental disorder: Secondary | ICD-10-CM | POA: Diagnosis not present

## 2016-09-29 DIAGNOSIS — J449 Chronic obstructive pulmonary disease, unspecified: Secondary | ICD-10-CM | POA: Diagnosis not present

## 2016-09-30 ENCOUNTER — Encounter (HOSPITAL_COMMUNITY): Payer: Self-pay | Admitting: *Deleted

## 2016-09-30 ENCOUNTER — Inpatient Hospital Stay (HOSPITAL_COMMUNITY)
Admission: EM | Admit: 2016-09-30 | Discharge: 2016-10-06 | DRG: 689 | Disposition: A | Discharge status: Skilled Nursing Facility | Payer: Medicare Other | Attending: Pulmonary Disease | Admitting: Pulmonary Disease

## 2016-09-30 DIAGNOSIS — Z86711 Personal history of pulmonary embolism: Secondary | ICD-10-CM

## 2016-09-30 DIAGNOSIS — F419 Anxiety disorder, unspecified: Secondary | ICD-10-CM | POA: Diagnosis not present

## 2016-09-30 DIAGNOSIS — M8788 Other osteonecrosis, other site: Secondary | ICD-10-CM | POA: Diagnosis present

## 2016-09-30 DIAGNOSIS — F0391 Unspecified dementia with behavioral disturbance: Secondary | ICD-10-CM | POA: Diagnosis not present

## 2016-09-30 DIAGNOSIS — E876 Hypokalemia: Secondary | ICD-10-CM | POA: Diagnosis not present

## 2016-09-30 DIAGNOSIS — F329 Major depressive disorder, single episode, unspecified: Secondary | ICD-10-CM | POA: Diagnosis present

## 2016-09-30 DIAGNOSIS — Z8249 Family history of ischemic heart disease and other diseases of the circulatory system: Secondary | ICD-10-CM | POA: Diagnosis not present

## 2016-09-30 DIAGNOSIS — R410 Disorientation, unspecified: Secondary | ICD-10-CM | POA: Diagnosis not present

## 2016-09-30 DIAGNOSIS — J449 Chronic obstructive pulmonary disease, unspecified: Secondary | ICD-10-CM | POA: Diagnosis not present

## 2016-09-30 DIAGNOSIS — H9190 Unspecified hearing loss, unspecified ear: Secondary | ICD-10-CM | POA: Diagnosis present

## 2016-09-30 DIAGNOSIS — I10 Essential (primary) hypertension: Secondary | ICD-10-CM | POA: Diagnosis present

## 2016-09-30 DIAGNOSIS — Z955 Presence of coronary angioplasty implant and graft: Secondary | ICD-10-CM

## 2016-09-30 DIAGNOSIS — E785 Hyperlipidemia, unspecified: Secondary | ICD-10-CM | POA: Diagnosis present

## 2016-09-30 DIAGNOSIS — N1831 Chronic kidney disease, stage 3a: Secondary | ICD-10-CM | POA: Diagnosis present

## 2016-09-30 DIAGNOSIS — R441 Visual hallucinations: Secondary | ICD-10-CM | POA: Diagnosis present

## 2016-09-30 DIAGNOSIS — N390 Urinary tract infection, site not specified: Secondary | ICD-10-CM | POA: Diagnosis not present

## 2016-09-30 DIAGNOSIS — G9340 Encephalopathy, unspecified: Secondary | ICD-10-CM | POA: Diagnosis present

## 2016-09-30 DIAGNOSIS — I714 Abdominal aortic aneurysm, without rupture, unspecified: Secondary | ICD-10-CM | POA: Diagnosis present

## 2016-09-30 DIAGNOSIS — Z823 Family history of stroke: Secondary | ICD-10-CM | POA: Diagnosis not present

## 2016-09-30 DIAGNOSIS — Z87891 Personal history of nicotine dependence: Secondary | ICD-10-CM

## 2016-09-30 DIAGNOSIS — N183 Chronic kidney disease, stage 3 (moderate): Secondary | ICD-10-CM | POA: Diagnosis present

## 2016-09-30 DIAGNOSIS — G9341 Metabolic encephalopathy: Secondary | ICD-10-CM | POA: Diagnosis present

## 2016-09-30 DIAGNOSIS — F03918 Unspecified dementia, unspecified severity, with other behavioral disturbance: Secondary | ICD-10-CM | POA: Diagnosis present

## 2016-09-30 DIAGNOSIS — Z809 Family history of malignant neoplasm, unspecified: Secondary | ICD-10-CM | POA: Diagnosis not present

## 2016-09-30 DIAGNOSIS — I129 Hypertensive chronic kidney disease with stage 1 through stage 4 chronic kidney disease, or unspecified chronic kidney disease: Secondary | ICD-10-CM | POA: Diagnosis present

## 2016-09-30 DIAGNOSIS — K219 Gastro-esophageal reflux disease without esophagitis: Secondary | ICD-10-CM | POA: Diagnosis present

## 2016-09-30 DIAGNOSIS — I739 Peripheral vascular disease, unspecified: Secondary | ICD-10-CM | POA: Diagnosis present

## 2016-09-30 DIAGNOSIS — E860 Dehydration: Secondary | ICD-10-CM | POA: Diagnosis present

## 2016-09-30 DIAGNOSIS — D649 Anemia, unspecified: Secondary | ICD-10-CM | POA: Diagnosis present

## 2016-09-30 DIAGNOSIS — I2510 Atherosclerotic heart disease of native coronary artery without angina pectoris: Secondary | ICD-10-CM | POA: Diagnosis present

## 2016-09-30 HISTORY — DX: Other pulmonary embolism without acute cor pulmonale: I26.99

## 2016-09-30 HISTORY — DX: Chronic kidney disease, stage 3 (moderate): N18.3
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
## 2016-10-01 ENCOUNTER — Encounter (HOSPITAL_COMMUNITY): Payer: Self-pay | Admitting: Internal Medicine

## 2016-10-01 ENCOUNTER — Inpatient Hospital Stay (HOSPITAL_COMMUNITY): Payer: Medicare Other

## 2016-10-01 DIAGNOSIS — Z8249 Family history of ischemic heart disease and other diseases of the circulatory system: Secondary | ICD-10-CM | POA: Diagnosis not present

## 2016-10-01 DIAGNOSIS — F0391 Unspecified dementia with behavioral disturbance: Secondary | ICD-10-CM | POA: Diagnosis not present

## 2016-10-01 DIAGNOSIS — Z86711 Personal history of pulmonary embolism: Secondary | ICD-10-CM | POA: Diagnosis not present

## 2016-10-01 DIAGNOSIS — Z823 Family history of stroke: Secondary | ICD-10-CM

## 2016-10-01 DIAGNOSIS — Z791 Long term (current) use of non-steroidal anti-inflammatories (NSAID): Secondary | ICD-10-CM

## 2016-10-01 DIAGNOSIS — F03918 Unspecified dementia, unspecified severity, with other behavioral disturbance: Secondary | ICD-10-CM | POA: Diagnosis present

## 2016-10-01 DIAGNOSIS — R410 Disorientation, unspecified: Secondary | ICD-10-CM

## 2016-10-01 DIAGNOSIS — E860 Dehydration: Secondary | ICD-10-CM | POA: Diagnosis present

## 2016-10-01 DIAGNOSIS — Z79899 Other long term (current) drug therapy: Secondary | ICD-10-CM

## 2016-10-01 DIAGNOSIS — R1319 Other dysphagia: Secondary | ICD-10-CM | POA: Diagnosis not present

## 2016-10-01 DIAGNOSIS — I739 Peripheral vascular disease, unspecified: Secondary | ICD-10-CM | POA: Diagnosis present

## 2016-10-01 DIAGNOSIS — I129 Hypertensive chronic kidney disease with stage 1 through stage 4 chronic kidney disease, or unspecified chronic kidney disease: Secondary | ICD-10-CM | POA: Diagnosis present

## 2016-10-01 DIAGNOSIS — Z87891 Personal history of nicotine dependence: Secondary | ICD-10-CM

## 2016-10-01 DIAGNOSIS — F329 Major depressive disorder, single episode, unspecified: Secondary | ICD-10-CM | POA: Diagnosis present

## 2016-10-01 DIAGNOSIS — G9340 Encephalopathy, unspecified: Secondary | ICD-10-CM | POA: Diagnosis not present

## 2016-10-01 DIAGNOSIS — F419 Anxiety disorder, unspecified: Secondary | ICD-10-CM

## 2016-10-01 DIAGNOSIS — N183 Chronic kidney disease, stage 3 (moderate): Secondary | ICD-10-CM | POA: Diagnosis not present

## 2016-10-01 DIAGNOSIS — N390 Urinary tract infection, site not specified: Secondary | ICD-10-CM | POA: Diagnosis present

## 2016-10-01 DIAGNOSIS — R441 Visual hallucinations: Secondary | ICD-10-CM | POA: Diagnosis present

## 2016-10-01 DIAGNOSIS — R4182 Altered mental status, unspecified: Secondary | ICD-10-CM | POA: Diagnosis not present

## 2016-10-01 DIAGNOSIS — N189 Chronic kidney disease, unspecified: Secondary | ICD-10-CM | POA: Diagnosis not present

## 2016-10-01 DIAGNOSIS — M8788 Other osteonecrosis, other site: Secondary | ICD-10-CM | POA: Diagnosis present

## 2016-10-01 DIAGNOSIS — Z809 Family history of malignant neoplasm, unspecified: Secondary | ICD-10-CM | POA: Diagnosis not present

## 2016-10-01 DIAGNOSIS — F339 Major depressive disorder, recurrent, unspecified: Secondary | ICD-10-CM | POA: Diagnosis not present

## 2016-10-01 DIAGNOSIS — E876 Hypokalemia: Secondary | ICD-10-CM | POA: Diagnosis present

## 2016-10-01 DIAGNOSIS — R443 Hallucinations, unspecified: Secondary | ICD-10-CM | POA: Diagnosis not present

## 2016-10-01 DIAGNOSIS — R278 Other lack of coordination: Secondary | ICD-10-CM | POA: Diagnosis not present

## 2016-10-01 DIAGNOSIS — R41841 Cognitive communication deficit: Secondary | ICD-10-CM | POA: Diagnosis not present

## 2016-10-01 DIAGNOSIS — L089 Local infection of the skin and subcutaneous tissue, unspecified: Secondary | ICD-10-CM | POA: Diagnosis not present

## 2016-10-01 DIAGNOSIS — K219 Gastro-esophageal reflux disease without esophagitis: Secondary | ICD-10-CM | POA: Diagnosis present

## 2016-10-01 DIAGNOSIS — J449 Chronic obstructive pulmonary disease, unspecified: Secondary | ICD-10-CM | POA: Diagnosis present

## 2016-10-01 DIAGNOSIS — Z7401 Bed confinement status: Secondary | ICD-10-CM | POA: Diagnosis not present

## 2016-10-01 DIAGNOSIS — I714 Abdominal aortic aneurysm, without rupture: Secondary | ICD-10-CM

## 2016-10-01 DIAGNOSIS — N3000 Acute cystitis without hematuria: Secondary | ICD-10-CM

## 2016-10-01 DIAGNOSIS — G8929 Other chronic pain: Secondary | ICD-10-CM | POA: Diagnosis not present

## 2016-10-01 DIAGNOSIS — M6281 Muscle weakness (generalized): Secondary | ICD-10-CM | POA: Diagnosis not present

## 2016-10-01 DIAGNOSIS — E785 Hyperlipidemia, unspecified: Secondary | ICD-10-CM | POA: Diagnosis present

## 2016-10-01 DIAGNOSIS — I10 Essential (primary) hypertension: Secondary | ICD-10-CM | POA: Diagnosis not present

## 2016-10-01 DIAGNOSIS — R279 Unspecified lack of coordination: Secondary | ICD-10-CM | POA: Diagnosis not present

## 2016-10-01 DIAGNOSIS — D649 Anemia, unspecified: Secondary | ICD-10-CM | POA: Diagnosis present

## 2016-10-01 DIAGNOSIS — I2510 Atherosclerotic heart disease of native coronary artery without angina pectoris: Secondary | ICD-10-CM | POA: Diagnosis present

## 2016-10-01 DIAGNOSIS — Z955 Presence of coronary angioplasty implant and graft: Secondary | ICD-10-CM | POA: Diagnosis not present

## 2016-10-01 DIAGNOSIS — F319 Bipolar disorder, unspecified: Secondary | ICD-10-CM | POA: Diagnosis not present

## 2016-10-01 DIAGNOSIS — F05 Delirium due to known physiological condition: Secondary | ICD-10-CM | POA: Diagnosis not present

## 2016-10-01 DIAGNOSIS — G9341 Metabolic encephalopathy: Secondary | ICD-10-CM | POA: Diagnosis present

## 2016-10-01 DIAGNOSIS — H9190 Unspecified hearing loss, unspecified ear: Secondary | ICD-10-CM | POA: Diagnosis present

## 2016-10-01 DIAGNOSIS — G4700 Insomnia, unspecified: Secondary | ICD-10-CM | POA: Diagnosis not present

## 2016-10-01 LAB — BASIC METABOLIC PANEL
ANION GAP: 7 (ref 5–15)
BUN: 26 mg/dL — ABNORMAL HIGH (ref 6–20)
CO2: 29 mmol/L (ref 22–32)
CREATININE: 1.47 mg/dL — AB (ref 0.44–1.00)
Calcium: 9.1 mg/dL (ref 8.9–10.3)
Chloride: 101 mmol/L (ref 101–111)
GFR, EST AFRICAN AMERICAN: 38 mL/min — AB (ref >60)
GFR, EST NON AFRICAN AMERICAN: 33 mL/min — AB (ref >60)
Glucose, Bld: 129 mg/dL — ABNORMAL HIGH (ref 65–99)
Potassium: 3 mmol/L — ABNORMAL LOW (ref 3.5–5.1)
Sodium: 137 mmol/L (ref 135–145)

## 2016-10-01 LAB — CBC WITH DIFFERENTIAL/PLATELET
BASOS ABS: 0 10*3/uL (ref 0.0–0.1)
BASOS PCT: 0 %
Eosinophils Absolute: 0.1 10*3/uL (ref 0.0–0.7)
Eosinophils Relative: 1 %
HCT: 41.1 % (ref 36.0–46.0)
Hemoglobin: 13.3 g/dL (ref 12.0–15.0)
Lymphocytes Relative: 37 %
Lymphs Abs: 3.4 10*3/uL (ref 0.7–4.0)
MCH: 28.9 pg (ref 26.0–34.0)
MCHC: 32.4 g/dL (ref 30.0–36.0)
MCV: 89.3 fL (ref 78.0–100.0)
MONO ABS: 0.8 10*3/uL (ref 0.1–1.0)
MONOS PCT: 8 %
Neutro Abs: 4.9 10*3/uL (ref 1.7–7.7)
Neutrophils Relative %: 54 %
PLATELETS: 160 10*3/uL (ref 150–400)
RBC: 4.6 MIL/uL (ref 3.87–5.11)
RDW: 14.7 % (ref 11.5–15.5)
WBC: 9.2 10*3/uL (ref 4.0–10.5)

## 2016-10-01 LAB — RAPID URINE DRUG SCREEN, HOSP PERFORMED
Amphetamines: NOT DETECTED
BARBITURATES: NOT DETECTED
Benzodiazepines: POSITIVE — AB
COCAINE: NOT DETECTED
Opiates: NOT DETECTED
Tetrahydrocannabinol: NOT DETECTED

## 2016-10-01 LAB — TSH: TSH: 2.83 u[IU]/mL (ref 0.350–4.500)

## 2016-10-01 LAB — VALPROIC ACID LEVEL: VALPROIC ACID LVL: 16 ug/mL — AB (ref 50.0–100.0)

## 2016-10-01 LAB — URINALYSIS, ROUTINE W REFLEX MICROSCOPIC
Bilirubin Urine: NEGATIVE
GLUCOSE, UA: NEGATIVE mg/dL
Hgb urine dipstick: NEGATIVE
Ketones, ur: NEGATIVE mg/dL
Nitrite: NEGATIVE
Protein, ur: NEGATIVE mg/dL
Specific Gravity, Urine: 1.02 (ref 1.005–1.030)
pH: 5.5 (ref 5.0–8.0)

## 2016-10-01 LAB — HEPATIC FUNCTION PANEL
ALBUMIN: 3.4 g/dL — AB (ref 3.5–5.0)
ALK PHOS: 104 U/L (ref 38–126)
ALT: 11 U/L — ABNORMAL LOW (ref 14–54)
AST: 16 U/L (ref 15–41)
Bilirubin, Direct: 0.1 mg/dL (ref 0.1–0.5)
Indirect Bilirubin: 0.4 mg/dL (ref 0.3–0.9)
TOTAL PROTEIN: 6.8 g/dL (ref 6.5–8.1)
Total Bilirubin: 0.5 mg/dL (ref 0.3–1.2)

## 2016-10-01 LAB — AMMONIA: AMMONIA: 20 umol/L (ref 9–35)

## 2016-10-01 LAB — URINE MICROSCOPIC-ADD ON

## 2016-10-01 LAB — MAGNESIUM: MAGNESIUM: 2.1 mg/dL (ref 1.7–2.4)

## 2016-10-01 LAB — TROPONIN I: Troponin I: <0.03 ng/mL (ref <0.03)

## 2016-10-01 LAB — ETHANOL: Alcohol, Ethyl (B): 6 mg/dL — ABNORMAL HIGH (ref <5)

## 2016-10-01 LAB — VITAMIN B12: Vitamin B-12: 507 pg/mL (ref 180–914)

## 2016-10-01 MED ORDER — NEBIVOLOL HCL 2.5 MG PO TABS
5.0000 mg | ORAL_TABLET | Freq: Every day | ORAL | Status: DC
  Administered 2016-10-01 – 2016-10-06 (×5): 5 mg via ORAL
  Filled 2016-10-01 (×3): qty 2
  Filled 2016-10-01 (×2): qty 1
  Filled 2016-10-01 (×2): qty 2
  Filled 2016-10-01: qty 1

## 2016-10-01 MED ORDER — RISAQUAD PO CAPS
1.0000 | ORAL_CAPSULE | Freq: Two times a day (BID) | ORAL | Status: DC
  Administered 2016-10-01 – 2016-10-06 (×8): 1 via ORAL
  Filled 2016-10-01 (×10): qty 1

## 2016-10-01 MED ORDER — ONDANSETRON HCL 4 MG/2ML IJ SOLN: 4.0000 mg | Freq: Four times a day (QID) | INTRAMUSCULAR | Status: DC | PRN

## 2016-10-01 MED ORDER — DIVALPROEX SODIUM 125 MG PO DR TAB
125.0000 mg | DELAYED_RELEASE_TABLET | Freq: Two times a day (BID) | ORAL | Status: DC
  Administered 2016-10-01 – 2016-10-02 (×4): 125 mg via ORAL
  Filled 2016-10-01 (×7): qty 1

## 2016-10-01 MED ORDER — POTASSIUM CHLORIDE CRYS ER 20 MEQ PO TBCR
30.0000 meq | EXTENDED_RELEASE_TABLET | Freq: Two times a day (BID) | ORAL | Status: DC
  Administered 2016-10-01 – 2016-10-06 (×9): 30 meq via ORAL
  Filled 2016-10-01 (×10): qty 1

## 2016-10-01 MED ORDER — POTASSIUM CHLORIDE CRYS ER 20 MEQ PO TBCR
20.0000 meq | EXTENDED_RELEASE_TABLET | Freq: Two times a day (BID) | ORAL | Status: DC
  Administered 2016-10-01: 20 meq via ORAL
  Filled 2016-10-01: qty 1

## 2016-10-01 MED ORDER — DEXTROSE 5 % IV SOLN
1.0000 g | INTRAVENOUS | Status: DC
  Administered 2016-10-02 – 2016-10-05 (×3): 1 g via INTRAVENOUS
  Filled 2016-10-01 (×5): qty 10

## 2016-10-01 MED ORDER — ACETAMINOPHEN 325 MG PO TABS
650.0000 mg | ORAL_TABLET | Freq: Four times a day (QID) | ORAL | Status: DC | PRN
  Administered 2016-10-04: 650 mg via ORAL
  Filled 2016-10-01: qty 2

## 2016-10-01 MED ORDER — ENOXAPARIN SODIUM 30 MG/0.3ML ~~LOC~~ SOLN
30.0000 mg | SUBCUTANEOUS | Status: DC
  Administered 2016-10-01 – 2016-10-05 (×4): 30 mg via SUBCUTANEOUS
  Filled 2016-10-01 (×4): qty 0.3

## 2016-10-01 MED ORDER — ACETAMINOPHEN 500 MG PO TABS: 500.0000 mg | ORAL_TABLET | Freq: Four times a day (QID) | ORAL | Status: DC | PRN

## 2016-10-01 MED ORDER — UMECLIDINIUM BROMIDE 62.5 MCG/INH IN AEPB
1.0000 | INHALATION_SPRAY | Freq: Every day | RESPIRATORY_TRACT | Status: DC
  Administered 2016-10-01 – 2016-10-05 (×4): 1 via RESPIRATORY_TRACT
  Filled 2016-10-01 (×2): qty 7

## 2016-10-01 MED ORDER — ONDANSETRON HCL 4 MG PO TABS: 4.0000 mg | ORAL_TABLET | Freq: Four times a day (QID) | ORAL | Status: DC | PRN

## 2016-10-01 MED ORDER — PANTOPRAZOLE SODIUM 40 MG PO TBEC
40.0000 mg | DELAYED_RELEASE_TABLET | Freq: Every day | ORAL | Status: DC
  Administered 2016-10-01 – 2016-10-06 (×5): 40 mg via ORAL
  Filled 2016-10-01 (×6): qty 1

## 2016-10-01 MED ORDER — POTASSIUM CHLORIDE CRYS ER 20 MEQ PO TBCR
40.0000 meq | EXTENDED_RELEASE_TABLET | Freq: Once | ORAL | Status: AC
  Administered 2016-10-01: 40 meq via ORAL
  Filled 2016-10-01: qty 2

## 2016-10-01 MED ORDER — TRAMADOL HCL 50 MG PO TABS
50.0000 mg | ORAL_TABLET | Freq: Two times a day (BID) | ORAL | Status: DC | PRN
  Administered 2016-10-01 – 2016-10-03 (×3): 50 mg via ORAL
  Filled 2016-10-01 (×3): qty 1

## 2016-10-01 MED ORDER — ACETAMINOPHEN 650 MG RE SUPP: 650.0000 mg | Freq: Four times a day (QID) | RECTAL | Status: DC | PRN

## 2016-10-01 MED ORDER — POTASSIUM CHLORIDE IN NACL 20-0.9 MEQ/L-% IV SOLN
INTRAVENOUS | Status: DC
  Administered 2016-10-01 – 2016-10-02 (×2): via INTRAVENOUS

## 2016-10-01 MED ORDER — RISPERIDONE 0.25 MG PO TABS
0.2500 mg | ORAL_TABLET | Freq: Two times a day (BID) | ORAL | Status: DC
  Filled 2016-10-01 (×5): qty 1

## 2016-10-01 MED ORDER — CEPHALEXIN 500 MG PO CAPS
500.0000 mg | ORAL_CAPSULE | Freq: Two times a day (BID) | ORAL | Status: DC
  Administered 2016-10-01 (×2): 500 mg via ORAL
  Filled 2016-10-01 (×2): qty 1

## 2016-10-01 MED ORDER — FUROSEMIDE 40 MG PO TABS
40.0000 mg | ORAL_TABLET | Freq: Every morning | ORAL | Status: DC
  Administered 2016-10-01 – 2016-10-06 (×5): 40 mg via ORAL
  Filled 2016-10-01 (×6): qty 1

## 2016-10-01 MED ORDER — ALBUTEROL SULFATE (2.5 MG/3ML) 0.083% IN NEBU: 2.5000 mg | INHALATION_SOLUTION | RESPIRATORY_TRACT | Status: DC | PRN

## 2016-10-01 MED ORDER — DEXTROSE 5 % IV SOLN
1.0000 g | Freq: Once | INTRAVENOUS | Status: AC
  Administered 2016-10-01: 1 g via INTRAVENOUS
  Filled 2016-10-01: qty 10

## 2016-10-01 MED ORDER — TRAZODONE HCL 50 MG PO TABS
50.0000 mg | ORAL_TABLET | Freq: Every day | ORAL | Status: DC
  Administered 2016-10-01 – 2016-10-05 (×5): 50 mg via ORAL
  Filled 2016-10-01 (×5): qty 1

## 2016-10-01 MED ORDER — CEPHALEXIN 500 MG PO CAPS: 500.0000 mg | ORAL_CAPSULE | Freq: Three times a day (TID) | ORAL | Status: DC

## 2016-10-01 MED ORDER — ALPRAZOLAM 0.25 MG PO TABS
0.2500 mg | ORAL_TABLET | Freq: Four times a day (QID) | ORAL | Status: DC | PRN
  Administered 2016-10-01 – 2016-10-02 (×6): 0.25 mg via ORAL
  Filled 2016-10-01 (×6): qty 1

## 2016-10-01 MED ORDER — POLYETHYLENE GLYCOL 3350 17 G PO PACK: 17.0000 g | PACK | Freq: Every day | ORAL | Status: DC | PRN

## 2016-10-01 MED ORDER — SODIUM CHLORIDE 0.9 % IV BOLUS (SEPSIS)
1000.0000 mL | Freq: Once | INTRAVENOUS | Status: AC
  Administered 2016-10-01: 1000 mL via INTRAVENOUS
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
## 2016-10-01 NOTE — ED Notes (Signed)
Pt to be re eval this morning by psych

## 2016-10-01 NOTE — ED Notes (Addendum)
Pt wants a nerve pill. EDP aware. , daughter [name] [name])called and stated she is on a lot of pain and anxiety meds and wanted EDP to know.

## 2016-10-01 NOTE — ED Notes (Signed)
Patient given meal tray. States "I want the ice cream but I don't want that food. I'm not really hungry."
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
## 2016-10-01 NOTE — ED Notes (Signed)
Called patient's daughter Diamantina Providence at [phone] to notify her that patient was admitted to hospital. No answer.

## 2016-10-01 NOTE — Progress Notes (Signed)
[place] for Pain medications at [facility]   Allergies  Allergen Reactions  . Zoloft [Sertraline Hcl] Anxiety  . Asa [Aspirin] Other (See Comments)    "my ears bleed"  . Lidocaine Other (See Comments)    unknown  . Sulfonamide Derivatives Other (See Comments)    unknown  . Ativan [Lorazepam] Anxiety    Per daughter     Estimated Creatinine Clearance: 28.4 mL/min (by C-G formula based on SCr of 1.47 mg/dL (H)).    Medications:  Prescriptions Prior to Admission  Medication Sig Dispense Refill Last Dose  . acetaminophen (TYLENOL) 500 MG tablet Take 500 mg by mouth every 6 (six) hours as needed for mild pain.   unknown at Unknown time  . ALPRAZolam (XANAX) 0.25 MG tablet Take 0.25 mg by mouth 4 (four) times daily.    09/30/2016 at Unknown time  . divalproex (DEPAKOTE) 125 MG DR tablet Take 125 mg by mouth 2 (two) times daily.   09/30/2016 at Unknown time  . furosemide (LASIX) 40 MG tablet Take 40 mg by mouth every morning.   09/30/2016 at Unknown time  . INCRUSE ELLIPTA 62.5 MCG/INH AEPB    09/30/2016 at Unknown time  . Lactobacillus (ACIDOPHILUS EXTRA STRENGTH PO) Take 1 tablet by mouth 2 (two) times daily.   09/30/2016 at Unknown time  . nebivolol (BYSTOLIC) 5 MG tablet Take 5 mg by mouth daily.   09/30/2016 at 0800  . omeprazole (PRILOSEC) 20 MG capsule Take 20 mg by mouth 2 (two) times daily before a meal.    09/30/2016 at Unknown time  . traMADol (ULTRAM) 50 MG tablet Take 50 mg by mouth 2 (two) times daily.   09/30/2016 at Unknown time  . traZODone (DESYREL) 50 MG tablet Take 1 tablet (50 mg total) by mouth at bedtime as needed for sleep (insomnia). (Patient taking differently: Take 50 mg by mouth at bedtime. ) 30 tablet 12 09/30/2016 at Unknown time    Assessment: Medication History updated with records from [facility]. Ultram 50mg  po BID was added to regimen.    Thanks for your consult, Isac Sarna, BS [place], BCPS Clinical  Pharmacist Pager [phone] 10/01/2016,8:44 PM
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
## 2016-10-01 NOTE — ED Notes (Signed)
Pt continue to sleep.
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
## 2016-10-01 NOTE — Consult Note (Signed)
Telepsych Consultation   Reason for Consult:  Behavioral disturbance Referring Physician:  EDP Patient Identification: [name] [name] MRN:  [id number] Principal Diagnosis: Delirium and exacerbation of underlying dementia  Diagnosis:   Patient Active Problem List   Diagnosis Date Noted  . Delirium [R41.0] 10/01/2016    Priority: High  . Dementia [F03.90] 10/01/2016    Priority: Medium  . Abdominal aneurysm without mention of rupture [I71.4] 06/01/2014  . Abdominal pain, left lower quadrant [R10.32] 06/01/2014  . Failure to thrive [IMO0002] 02/02/2014  . Generalized weakness [R53.1] 02/02/2014  . Gait difficulty [R26.9] 02/02/2014  . Confusion [R41.0] 02/02/2014  . Low back pain [M54.5] 02/02/2014  . Chest pressure [R07.89] 08/10/2013  . Abdominal pressure [R10.9] 08/10/2013  . Appendicitis, acute [K35.80] 05/28/2013  . Bradycardia [R00.1] 05/28/2013  . Chronic kidney disease (CKD) stage G3a/A1, moderately decreased glomerular filtration rate (GFR) between 45-59 mL/min/1.73 square meter and albuminuria creatinine ratio less than 30 mg/g [N18.3] 05/25/2013  . Abdominal pain [R10.9] 05/20/2013  . Nausea and vomiting [R11.2] 05/20/2013  . UTI (urinary tract infection) [N39.0] 05/20/2013  . Renal insufficiency [N28.9] 05/20/2013  . AAA (abdominal aortic aneurysm) without rupture ([place]) [I71.4] 02/09/2013  . RUQ abdominal pain [R10.11] 12/28/2012  . Failure to thrive in adult [R62.7] 12/28/2012  . Dehydration [E86.0] 12/28/2012  . Hypokalemia [E87.6] 12/28/2012  . Small bowel obstruction [K56.609] 09/02/2012  . Anemia [D64.9] 09/01/2012  . Peripheral vascular disease ([place]) [I73.9] 09/01/2012  . Hypertension [I10]   . COPD (chronic obstructive pulmonary disease) ([place]) [J44.9]   . Atrial arrhythmia [I49.8]   . AAA (abdominal aortic aneurysm) ([place]) [I71.4]   . Arteriosclerotic cardiovascular disease (ASCVD) [I25.10]   . Tobacco abuse, in remission [F17.201]   . Pituitary microadenoma  ([place]) [D35.2]   . Pulmonary embolism ([place]) [I26.99] 08/14/2012  . Hyperlipidemia [E78.5] 05/24/2012  . ARTHRITIS, HIPS, BILATERAL [M16.10] 06/06/2010    Total Time spent with patient: 30 minutes  Subjective:   [name] [name] is a 81 y.o. female patient admitted with reports of a change in behavior in her nursing facility. Upon arrival at the ED, it was found that she also has a UTI. Pt seen and chart reviewed. Pt is alert/oriented to self and place only, mildly agitated, cooperative, and appropriate to situation. Pt denies suicidal/homicidal ideation and does not appear to be responding to internal stimuli. However, with detailed discussion, pt revealed that she is very agitated "because they are talking about me, I can hear them through the wall" and that she was upset "that they stayed at my house for 2 days, my family did and I didn't invite them." Pt was able to be redirected easily and may be mildly delirious, exacerbating her underlying dementia.   HPI:  I have reviewed and concur with HPI elements below, modified as follows: [name] [name] is an 81 y.o. widowed female who presents to [facility] ED under involuntary commitment taken out by staff at [facility]. Pt is accompanied by her daughter/guardian Diamantina [facility] [phone]. Tonight Pt became aggressive with another resident and was yelling and screaming. [name] states that for the past 3-4 weeks Pt has said she saw people who were not there, believes her niece is living with her and killing babies and that she is still living with her son in a trailer and that he wants to kill her. Pt acknowledges she believes these things. Pt acknowledges symptoms including crying spells, fatigue, irritability and feeling nervous and lonely. She reports poor sleep  and decreased eating. Pt denies current suicidal ideation, thoughts of dying or any history of suicide attempts. She denies thoughts of wanting to harm others and denies  history of violence. She has no know history of substance abuse.  Pt says she hurts all over and doesn't like there as so many people living in her residence. [name] states Pt was living with her until July 2017 when she went to nursing home. [name] states nursing home staff were giving Pt Zoloft and Ativan and that Pt is allergic to both medications. [name] says Pt doesn't normally have behavioral disturbance unless something is wrong with her medications or Pt has a urinary tract infection. Ms Burney Gauze states Pt has been psychiatrically hospitalized in the past at [facility] and [facility] for mood disturbance. She states Pt has a third grade education, has never been employed and has been windowed since 1981. Pt uses a wheelchair and needs some assistance with bathing.   Pt is dressed in hospital gown and alert. She is oriented to person and place but not year or situation. She does not know where she lives and cannot name the president of the [place]. Speech is of normal rate and volume. Motor behavior appears unremarkable. Pt has hearing impairment but is able to understand speech at elevated volume. Eye contact is good. Pt's mood is anxious and affect is congruent with mood. Thought process is coherent. Pt was generally cooperative throughout assessment.  Past Psychiatric History: Dementia  Risk to Self: Suicidal Ideation: No Suicidal Intent: No Is patient at risk for suicide?: No Suicidal Plan?: No Access to Means: No What has been your use of drugs/alcohol within the last 12 months?: None How many times?: 0 Other Self Harm Risks: None Triggers for Past Attempts: None known Intentional Self Injurious Behavior: None Risk to Others: Homicidal Ideation: No Thoughts of Harm to Others: No Current Homicidal Intent: No Current Homicidal Plan: No Access to Homicidal Means: No Identified Victim: None History of harm to others?: No Assessment of Violence: On  admission Violent Behavior Description: Pt aggressive with nursing home staff Does patient have access to weapons?: No Criminal Charges Pending?: No Does patient have a court date: No Prior Inpatient Therapy: Prior Inpatient Therapy: Yes Prior Therapy Dates: Unknown Prior Therapy Facilty/Provider(s): [facility], [place] Reason for Treatment: Dementia, mood disturbance Prior Outpatient Therapy: Prior Outpatient Therapy: Yes Prior Therapy Dates: Currently Prior Therapy Facilty/Provider(s): Dr. Luan Pulling Reason for Treatment: Dementia, mood disturbance Does patient have an ACCT team?: No Does patient have Intensive In-House Services?  : No Does patient have [facility] services? : No Does patient have P4CC services?: No  Past Medical History:  Past Medical History:  Diagnosis Date  . AAA (abdominal aortic aneurysm) ([place])    infrarenal; 07/2012: Diameter of 5.4 cm  . AAA (abdominal aortic aneurysm) ([place])   . Anemia   . Arteriosclerotic cardiovascular disease (ASCVD)    Cardiac cath in 03/2000->  80% LAD, 70-80% RCA;  stent x1 in the LAD and x2 in the RCA with excellent angiographic outcome; repeat catheterization in late 2001 and 2003 revealed no restenosis; EF-50%  . Atrial arrhythmia    Atrial fibrillation and atrial flutter diagnosed in the past;?  PSVT  . COPD (chronic obstructive pulmonary disease) ([place])    multiple hospitalizations for exacerbations with acute bronchitis  . Coronary artery disease   . Degenerative joint disease    Bilateral hips  . Dementia   . Depression with anxiety   .  DJD (degenerative joint disease)   . Encephalopathy   . Gall stones   . GERD (gastroesophageal reflux disease)    Hiatal hernia  . Hyperlipidemia   . Hyperlipidemia   . Hypernatremia   . Hypertension   . Hypokalemia   . Ileus ([place])   . Nephrolithiasis   . Pituitary microadenoma ([place])    transsphenoidal resection in 09/2007  . PVD (peripheral vascular disease) ([place])   . Renal  failure   . SBO (small bowel obstruction)   . Tobacco abuse, in remission    Quit in 2006; total consumption of 50-75 pack years    Past Surgical History:  Procedure Laterality Date  . APPENDECTOMY N/A 05/25/2013   Procedure: APPENDECTOMY;  Surgeon: Donato Heinz, MD;  Location: [facility];  Service: General;  Laterality: N/A;  . CARDIAC CATHETERIZATION  2001   post PTCA and stenting of her LAD and RCA  . KIDNEY STONE SURGERY    . TRANSPHENOIDAL / TRANSNASAL HYPOPHYSECTOMY / RESECTION PITUITARY TUMOR  09/2007   Family History:  Family History  Problem Relation Age of Onset  . Heart disease Mother   . Hyperlipidemia Mother   . Hypertension Mother   . Stroke Mother   . Cancer Brother    Family Psychiatric  History: MDD Social History:  History  Alcohol Use No     History  Drug Use No    Social History   Social History  . Marital status: Widowed    Spouse name: N/A  . Number of children: N/A  . Years of education: N/A   Social History Main Topics  . Smoking status: Former Smoker    Packs/day: 1.50    Years: 50.00    Types: Cigarettes    Quit date: 05/27/2005  . Smokeless tobacco: Former Systems developer    Types: Snuff    Quit date: 01/11/2008  . Alcohol use No  . Drug use: No  . Sexual activity: Not Currently   Other Topics Concern  . None   Social History Narrative  . None   Additional Social History:    Allergies:   Allergies  Allergen Reactions  . Zoloft [Sertraline Hcl] Anxiety  . Asa [Aspirin] Other (See Comments)    "my ears bleed"  . Lidocaine Other (See Comments)    unknown  . Sulfonamide Derivatives Other (See Comments)    unknown  . Ativan [Lorazepam] Anxiety    Per daughter    Labs:  Results for orders placed or performed during the hospital encounter of 09/30/16 (from the past 48 hour(s))  CBC with Differential     Status: None   Collection Time: 10/01/16 12:04 AM  Result Value Ref Range   WBC 9.2 4.0 - 10.5 K/uL   RBC 4.60 3.87 - 5.11 MIL/uL    Hemoglobin 13.3 12.0 - 15.0 g/dL   HCT 41.1 36.0 - 46.0 %   MCV 89.3 78.0 - 100.0 fL   MCH 28.9 26.0 - 34.0 pg   MCHC 32.4 30.0 - 36.0 g/dL   RDW 14.7 11.5 - 15.5 %   Platelets 160 150 - 400 K/uL   Neutrophils Relative % 54 %   Neutro Abs 4.9 1.7 - 7.7 K/uL   Lymphocytes Relative 37 %   Lymphs Abs 3.4 0.7 - 4.0 K/uL   Monocytes Relative 8 %   Monocytes Absolute 0.8 0.1 - 1.0 K/uL   Eosinophils Relative 1 %   Eosinophils Absolute 0.1 0.0 - 0.7 K/uL   Basophils Relative  0 %   Basophils Absolute 0.0 0.0 - 0.1 K/uL  Basic metabolic panel     Status: Abnormal   Collection Time: 10/01/16 12:04 AM  Result Value Ref Range   Sodium 137 135 - 145 mmol/L   Potassium 3.0 (L) 3.5 - 5.1 mmol/L   Chloride 101 101 - 111 mmol/L   CO2 29 22 - 32 mmol/L   Glucose, Bld 129 (H) 65 - 99 mg/dL   BUN 26 (H) 6 - 20 mg/dL   Creatinine, Ser 1.47 (H) 0.44 - 1.00 mg/dL   Calcium 9.1 8.9 - 10.3 mg/dL   GFR calc non Af Amer 33 (L) >60 mL/min   GFR calc Af Amer 38 (L) >60 mL/min    Comment: (NOTE) The eGFR has been calculated using the CKD EPI equation. This calculation has not been validated in all clinical situations. eGFR's persistently <60 mL/min signify possible Chronic Kidney Disease.    Anion gap 7 5 - 15  Ethanol     Status: Abnormal   Collection Time: 10/01/16 12:04 AM  Result Value Ref Range   Alcohol, Ethyl (B) 6 (H) <5 mg/dL    Comment:        LOWEST DETECTABLE LIMIT FOR SERUM ALCOHOL IS 5 mg/dL FOR MEDICAL PURPOSES ONLY   Valproic acid level     Status: Abnormal   Collection Time: 10/01/16 12:04 AM  Result Value Ref Range   Valproic Acid Lvl 16 (L) 50.0 - 100.0 ug/mL    Comment: RESULTS CONFIRMED BY MANUAL DILUTION CRITICAL RESULT CALLED TO, READ BACK BY AND VERIFIED WITH: PRUITT G AT 0121 ON 101017 BY FORSYTH K CORRECTED ON 10/11 AT 0120: PREVIOUSLY REPORTED AS 58   Rapid urine drug screen (hospital performed)     Status: Abnormal   Collection Time: 10/01/16 12:50 AM  Result  Value Ref Range   Opiates NONE DETECTED NONE DETECTED   Cocaine NONE DETECTED NONE DETECTED   Benzodiazepines POSITIVE (A) NONE DETECTED   Amphetamines NONE DETECTED NONE DETECTED   Tetrahydrocannabinol NONE DETECTED NONE DETECTED   Barbiturates NONE DETECTED NONE DETECTED    Comment:        DRUG SCREEN FOR MEDICAL PURPOSES ONLY.  IF CONFIRMATION IS NEEDED FOR ANY PURPOSE, NOTIFY LAB WITHIN 5 DAYS.        LOWEST DETECTABLE LIMITS FOR URINE DRUG SCREEN Drug Class       Cutoff (ng/mL) Amphetamine      1000 Barbiturate      200 Benzodiazepine   196 Tricyclics       222 Opiates          300 Cocaine          300 THC              50   Urinalysis, Routine w reflex microscopic (not at [facility])     Status: Abnormal   Collection Time: 10/01/16 12:50 AM  Result Value Ref Range   Color, Urine YELLOW YELLOW   APPearance CLEAR CLEAR   Specific Gravity, Urine 1.020 1.005 - 1.030   pH 5.5 5.0 - 8.0   Glucose, UA NEGATIVE NEGATIVE mg/dL   Hgb urine dipstick NEGATIVE NEGATIVE   Bilirubin Urine NEGATIVE NEGATIVE   Ketones, ur NEGATIVE NEGATIVE mg/dL   Protein, ur NEGATIVE NEGATIVE mg/dL   Nitrite NEGATIVE NEGATIVE   Leukocytes, UA TRACE (A) NEGATIVE  Urine microscopic-add on     Status: Abnormal   Collection Time: 10/01/16 12:50 AM  Result Value Ref Range  Squamous Epithelial / LPF 6-30 (A) NONE SEEN   WBC, UA 6-30 0 - 5 WBC/hpf   RBC / HPF 0-5 0 - 5 RBC/hpf   Bacteria, UA MANY (A) NONE SEEN   Casts HYALINE CASTS (A) NEGATIVE    Current Facility-Administered Medications  Medication Dose Route Frequency Provider Last Rate Last Dose  . acetaminophen (TYLENOL) tablet 500 mg  500 mg Oral Q6H PRN Nat Christen, MD      . ALPRAZolam Duanne Moron) tablet 0.25 mg  0.25 mg Oral QID PRN Nat Christen, MD   0.25 mg at 10/01/16 1109  . cephALEXin (KEFLEX) capsule 500 mg  500 mg Oral Q12H Merryl Hacker, MD   500 mg at 10/01/16 0931  . divalproex (DEPAKOTE) DR tablet 125 mg  125 mg Oral BID Nat Christen, MD       . furosemide (LASIX) tablet 40 mg  40 mg Oral q morning - 10a Nat Christen, MD   40 mg at 10/01/16 1109  . nebivolol (BYSTOLIC) tablet 5 mg  5 mg Oral Daily Nat Christen, MD      . pantoprazole (PROTONIX) EC tablet 40 mg  40 mg Oral Daily Nat Christen, MD   40 mg at 10/01/16 1109  . risperiDONE (RISPERDAL) tablet 0.25 mg  0.25 mg Oral BID Nat Christen, MD      . traZODone (DESYREL) tablet 50 mg  50 mg Oral QHS Nat Christen, MD      . umeclidinium bromide (INCRUSE ELLIPTA) 62.5 MCG/INH 1 puff  1 puff Inhalation Daily Nat Christen, MD       Current Outpatient Prescriptions  Medication Sig Dispense Refill  . acetaminophen (TYLENOL) 500 MG tablet Take 500 mg by mouth every 6 (six) hours as needed for mild pain.    Marland Kitchen ALPRAZolam (XANAX) 0.25 MG tablet Take 0.25 mg by mouth 4 (four) times daily as needed for anxiety.    . divalproex (DEPAKOTE) 125 MG DR tablet Take 125 mg by mouth 2 (two) times daily.    . furosemide (LASIX) 40 MG tablet Take 40 mg by mouth every morning.    . INCRUSE ELLIPTA 62.5 MCG/INH AEPB     . Lactobacillus (ACIDOPHILUS EXTRA STRENGTH PO) Take 1 tablet by mouth 2 (two) times daily.    . nebivolol (BYSTOLIC) 5 MG tablet Take 5 mg by mouth daily.    Marland Kitchen omeprazole (PRILOSEC) 20 MG capsule Take 20 mg by mouth 2 (two) times daily before a meal.     . traZODone (DESYREL) 50 MG tablet Take 1 tablet (50 mg total) by mouth at bedtime as needed for sleep (insomnia). (Patient taking differently: Take 50 mg by mouth at bedtime. ) 30 tablet 12    Musculoskeletal: UTO, camera  Psychiatric Specialty Exam: Physical Exam  Review of Systems  Psychiatric/Behavioral: Positive for depression. Negative for hallucinations (more so delusions and agitation) and suicidal ideas. The patient is nervous/anxious.   All other systems reviewed and are negative.   Blood pressure 126/76, pulse (!) 51, temperature 97.3 F (36.3 C), temperature source Oral, resp. rate 20, SpO2 96 %.There is no height or weight on  file to calculate BMI.  General Appearance: Casual and Fairly Groomed  Eye Contact:  Fair  Speech:  Clear and Coherent and Normal Rate  Volume:  Normal  Mood:  Anxious  Affect:  Appropriate and Congruent  Thought Process:  Descriptions of Associations: Tangential  Orientation:  Self and place  Thought Content:  Fears of family members talking about  her  Suicidal Thoughts:  No  Homicidal Thoughts:  No  Memory:  Immediate;   Fair Recent;   Fair Remote;   Fair  Judgement:  Fair  Insight:  Fair  Psychomotor Activity:  Normal  Concentration:  Concentration: Fair and Attention Span: Fair  Recall:  Poor  Fund of Knowledge:  Fair  Language:  Fair  Akathisia:  No  Handed:    AIMS (if indicated):     Assets:  Communication Skills Desire for Improvement Resilience Social Support Talents/Skills  ADL's:  Intact  Cognition:  Impaired,  Moderate  Sleep:      Treatment Plan Summary: Delirium with underlying dementia, waxing and waning, treated as below:  Medications: -Continue Xanax low-dose (0.92m qhs prn only)  for now unless pt's condition deteriorates severely -Consider adding Risperidone 0.248mpo bid for agitation and stabilization of behavior -Hold Depakote for the time being -Update EKG 12-lead as there is not one since June  Disposition: No evidence of imminent risk to self or others at present.   Patient does not meet criteria for psychiatric inpatient admission. Pt does not meet inpatient psychiatric criteria at this time but continues to present as delirious and may not be able to complete her ADL's alone at this time. However, pt resides in a nursing home and may be able to discharge home soon if she is deemed medically stable while her UTI and hypokalmeia are being treated.   WiBenjamine MolaFNP 10/01/2016 11:27 AM  Agree with NP note and assessment

## 2016-10-01 NOTE — ED Notes (Signed)
Pharmacy aware of meds needed.

## 2016-10-01 NOTE — ED Notes (Signed)
Pt awake now. Talking loudly about people ans things that make no sense.

## 2016-10-01 NOTE — ED Notes (Signed)
Pt assisted to bathroom via wheelchair. Pt very unsteady.

## 2016-10-01 NOTE — ED Notes (Signed)
Pt's daughter Diamantina [facility]) can be reached at [phone] if needed.

## 2016-10-01 NOTE — ED Notes (Signed)
Patient continues to complain of hearing voices. States "Will you please tell them to leave me alone and stop telling me stuff."

## 2016-10-02 ENCOUNTER — Ambulatory Visit (HOSPITAL_COMMUNITY): Payer: Medicare Other

## 2016-10-02 LAB — CBC
HEMATOCRIT: 39.5 % (ref 36.0–46.0)
Hemoglobin: 12.3 g/dL (ref 12.0–15.0)
MCH: 28.3 pg (ref 26.0–34.0)
MCHC: 31.1 g/dL (ref 30.0–36.0)
MCV: 90.8 fL (ref 78.0–100.0)
PLATELETS: 150 10*3/uL (ref 150–400)
RBC: 4.35 MIL/uL (ref 3.87–5.11)
RDW: 15 % (ref 11.5–15.5)
WBC: 7.7 10*3/uL (ref 4.0–10.5)

## 2016-10-02 LAB — BASIC METABOLIC PANEL
ANION GAP: 4 — AB (ref 5–15)
BUN: 20 mg/dL (ref 6–20)
CALCIUM: 8.9 mg/dL (ref 8.9–10.3)
CO2: 26 mmol/L (ref 22–32)
Chloride: 111 mmol/L (ref 101–111)
Creatinine, Ser: 1.15 mg/dL — ABNORMAL HIGH (ref 0.44–1.00)
GFR, EST AFRICAN AMERICAN: 51 mL/min — AB (ref >60)
GFR, EST NON AFRICAN AMERICAN: 44 mL/min — AB (ref >60)
Glucose, Bld: 90 mg/dL (ref 65–99)
POTASSIUM: 4.8 mmol/L (ref 3.5–5.1)
Sodium: 141 mmol/L (ref 135–145)

## 2016-10-02 LAB — URINE CULTURE

## 2016-10-02 LAB — MRSA PCR SCREENING: MRSA by PCR: NEGATIVE

## 2016-10-02 LAB — HEMOGLOBIN A1C
Hgb A1c MFr Bld: 5.3 % (ref 4.8–5.6)
MEAN PLASMA GLUCOSE: 105 mg/dL

## 2016-10-02 LAB — RPR: RPR: NONREACTIVE

## 2016-10-02 MED ORDER — SODIUM CHLORIDE 0.9 % IV SOLN
INTRAVENOUS | Status: DC
  Administered 2016-10-02 – 2016-10-05 (×5): via INTRAVENOUS

## 2016-10-02 NOTE — Progress Notes (Signed)
Subjective: She was admitted last night with confusion. I think this may be related to urinary tract infection. At baseline she has some element of anxiety and depression and has been confused in the past. She is a resident of skilled care facility. Initially she was agitated and was threatening other inpatients at this facility but that has improved. She was initially set up for involuntary commitment but it is felt that that is not appropriate at this time. She denies any nausea vomiting diarrhea chest pain but she is short of breath. She is not having a headache. She does have dysuria. Review of systems otherwise negative but with her mental status possibly not totally accurate  Objective: Vital signs in last 24 hours: Temp:  [98 F (36.7 C)-98.3 F (36.8 C)] 98.2 F (36.8 C) (10/12 0530) Pulse Rate:  [57-66] 66 (10/12 0530) Resp:  [18-20] 20 (10/12 0530) BP: (116-147)/(61-98) 132/81 (10/12 0530) SpO2:  [96 %-100 %] 100 % (10/12 0530) Weight:  [66.3 kg (146 lb 2.6 oz)-66.5 kg (146 lb 9.7 oz)] 66.3 kg (146 lb 2.6 oz) (10/12 0530) Weight change:  Last BM Date: 10/01/16  Intake/Output from previous day: 10/11 0701 - 10/12 0700 In: 831.3 [I.V.:831.3] Out: -   PHYSICAL EXAM General appearance: alert and Mildly confused but substantially better than her previous description. She is not agitated Resp: clear to auscultation bilaterally Cardio: regular rate and rhythm, S1, S2 normal, no murmur, click, rub or gallop GI: soft, non-tender; bowel sounds normal; no masses,  no organomegaly Extremities: extremities normal, atraumatic, no cyanosis or edema Eyes: Pupils are reactive ears nose and throat her mucous membranes are still mildly dry skin: Her skin turgor is still poor  Lab Results:  Results for orders placed or performed during the hospital encounter of 09/30/16 (from the past 48 hour(s))  CBC with Differential     Status: None   Collection Time: 10/01/16 12:04 AM  Result Value Ref  Range   WBC 9.2 4.0 - 10.5 K/uL   RBC 4.60 3.87 - 5.11 MIL/uL   Hemoglobin 13.3 12.0 - 15.0 g/dL   HCT 41.1 36.0 - 46.0 %   MCV 89.3 78.0 - 100.0 fL   MCH 28.9 26.0 - 34.0 pg   MCHC 32.4 30.0 - 36.0 g/dL   RDW 14.7 11.5 - 15.5 %   Platelets 160 150 - 400 K/uL   Neutrophils Relative % 54 %   Neutro Abs 4.9 1.7 - 7.7 K/uL   Lymphocytes Relative 37 %   Lymphs Abs 3.4 0.7 - 4.0 K/uL   Monocytes Relative 8 %   Monocytes Absolute 0.8 0.1 - 1.0 K/uL   Eosinophils Relative 1 %   Eosinophils Absolute 0.1 0.0 - 0.7 K/uL   Basophils Relative 0 %   Basophils Absolute 0.0 0.0 - 0.1 K/uL  Basic metabolic panel     Status: Abnormal   Collection Time: 10/01/16 12:04 AM  Result Value Ref Range   Sodium 137 135 - 145 mmol/L   Potassium 3.0 (L) 3.5 - 5.1 mmol/L   Chloride 101 101 - 111 mmol/L   CO2 29 22 - 32 mmol/L   Glucose, Bld 129 (H) 65 - 99 mg/dL   BUN 26 (H) 6 - 20 mg/dL   Creatinine, Ser 1.47 (H) 0.44 - 1.00 mg/dL   Calcium 9.1 8.9 - 10.3 mg/dL   GFR calc non Af Amer 33 (L) >60 mL/min   GFR calc Af Amer 38 (L) >60 mL/min    Comment: (  NOTE) The eGFR has been calculated using the CKD EPI equation. This calculation has not been validated in all clinical situations. eGFR's persistently <60 mL/min signify possible Chronic Kidney Disease.    Anion gap 7 5 - 15  Ethanol     Status: Abnormal   Collection Time: 10/01/16 12:04 AM  Result Value Ref Range   Alcohol, Ethyl (B) 6 (H) <5 mg/dL    Comment:        LOWEST DETECTABLE LIMIT FOR SERUM ALCOHOL IS 5 mg/dL FOR MEDICAL PURPOSES ONLY   Valproic acid level     Status: Abnormal   Collection Time: 10/01/16 12:04 AM  Result Value Ref Range   Valproic Acid Lvl 16 (L) 50.0 - 100.0 ug/mL    Comment: RESULTS CONFIRMED BY MANUAL DILUTION CRITICAL RESULT CALLED TO, READ BACK BY AND VERIFIED WITH: PRUITT G AT 0121 ON [id number] BY FORSYTH K CORRECTED ON 10/11 AT 0120: PREVIOUSLY REPORTED AS 58   Rapid urine drug screen (hospital performed)      Status: Abnormal   Collection Time: 10/01/16 12:50 AM  Result Value Ref Range   Opiates NONE DETECTED NONE DETECTED   Cocaine NONE DETECTED NONE DETECTED   Benzodiazepines POSITIVE (A) NONE DETECTED   Amphetamines NONE DETECTED NONE DETECTED   Tetrahydrocannabinol NONE DETECTED NONE DETECTED   Barbiturates NONE DETECTED NONE DETECTED    Comment:        DRUG SCREEN FOR MEDICAL PURPOSES ONLY.  IF CONFIRMATION IS NEEDED FOR ANY PURPOSE, NOTIFY LAB WITHIN 5 DAYS.        LOWEST DETECTABLE LIMITS FOR URINE DRUG SCREEN Drug Class       Cutoff (ng/mL) Amphetamine      1000 Barbiturate      200 Benzodiazepine   007 Tricyclics       121 Opiates          300 Cocaine          300 THC              50   Urinalysis, Routine w reflex microscopic (not at [facility])     Status: Abnormal   Collection Time: 10/01/16 12:50 AM  Result Value Ref Range   Color, Urine YELLOW YELLOW   APPearance CLEAR CLEAR   Specific Gravity, Urine 1.020 1.005 - 1.030   pH 5.5 5.0 - 8.0   Glucose, UA NEGATIVE NEGATIVE mg/dL   Hgb urine dipstick NEGATIVE NEGATIVE   Bilirubin Urine NEGATIVE NEGATIVE   Ketones, ur NEGATIVE NEGATIVE mg/dL   Protein, ur NEGATIVE NEGATIVE mg/dL   Nitrite NEGATIVE NEGATIVE   Leukocytes, UA TRACE (A) NEGATIVE  Urine microscopic-add on     Status: Abnormal   Collection Time: 10/01/16 12:50 AM  Result Value Ref Range   Squamous Epithelial / LPF 6-30 (A) NONE SEEN   WBC, UA 6-30 0 - 5 WBC/hpf   RBC / HPF 0-5 0 - 5 RBC/hpf   Bacteria, UA MANY (A) NONE SEEN   Casts HYALINE CASTS (A) NEGATIVE  Vitamin B12     Status: None   Collection Time: 10/01/16  3:10 PM  Result Value Ref Range   Vitamin B-12 507 180 - 914 pg/mL    Comment: (NOTE) This assay is not validated for testing neonatal or myeloproliferative syndrome specimens for Vitamin B12 levels. Performed at [facility]   Hemoglobin A1c     Status: None   Collection Time: 10/01/16  3:10 PM  Result Value Ref Range   Hgb A1c  MFr Bld 5.3 4.8 - 5.6 %    Comment: (NOTE)         Pre-diabetes: 5.7 - 6.4         Diabetes: >6.4         Glycemic control for adults with diabetes: <7.0    Mean Plasma Glucose 105 mg/dL    Comment: (NOTE) Performed At: [facility] [place], [place] [id number] Lindon Romp MD [id number]   Magnesium     Status: None   Collection Time: 10/01/16  3:12 PM  Result Value Ref Range   Magnesium 2.1 1.7 - 2.4 mg/dL  Ammonia     Status: None   Collection Time: 10/01/16  3:12 PM  Result Value Ref Range   Ammonia 20 9 - 35 umol/L  TSH     Status: None   Collection Time: 10/01/16  3:15 PM  Result Value Ref Range   TSH 2.830 0.350 - 4.500 uIU/mL    Comment: Performed by a 3rd Generation assay with a functional sensitivity of <=0.01 uIU/mL.  RPR     Status: None   Collection Time: 10/01/16  3:15 PM  Result Value Ref Range   RPR Ser Ql Non Reactive Non Reactive    Comment: (NOTE) Performed At: [facility] [place], [place] [id number] Lindon Romp MD [id number]   Hepatic function panel     Status: Abnormal   Collection Time: 10/01/16  3:15 PM  Result Value Ref Range   Total Protein 6.8 6.5 - 8.1 g/dL   Albumin 3.4 (L) 3.5 - 5.0 g/dL   AST 16 15 - 41 U/L   ALT 11 (L) 14 - 54 U/L   Alkaline Phosphatase 104 38 - 126 U/L   Total Bilirubin 0.5 0.3 - 1.2 mg/dL   Bilirubin, Direct 0.1 0.1 - 0.5 mg/dL   Indirect Bilirubin 0.4 0.3 - 0.9 mg/dL  Troponin I     Status: None   Collection Time: 10/01/16  3:27 PM  Result Value Ref Range   Troponin I <0.03 <0.03 ng/mL  CBC     Status: None   Collection Time: 10/02/16  6:04 AM  Result Value Ref Range   WBC 7.7 4.0 - 10.5 K/uL   RBC 4.35 3.87 - 5.11 MIL/uL   Hemoglobin 12.3 12.0 - 15.0 g/dL   HCT 39.5 36.0 - 46.0 %   MCV 90.8 78.0 - 100.0 fL   MCH 28.3 26.0 - 34.0 pg   MCHC 31.1 30.0 - 36.0 g/dL   RDW 15.0 11.5 - 15.5 %   Platelets 150 150 - 400 K/uL  Basic metabolic panel      Status: Abnormal   Collection Time: 10/02/16  6:04 AM  Result Value Ref Range   Sodium 141 135 - 145 mmol/L   Potassium 4.8 3.5 - 5.1 mmol/L    Comment: DELTA CHECK NOTED   Chloride 111 101 - 111 mmol/L   CO2 26 22 - 32 mmol/L   Glucose, Bld 90 65 - 99 mg/dL   BUN 20 6 - 20 mg/dL   Creatinine, Ser 1.15 (H) 0.44 - 1.00 mg/dL   Calcium 8.9 8.9 - 10.3 mg/dL   GFR calc non Af Amer 44 (L) >60 mL/min   GFR calc Af Amer 51 (L) >60 mL/min    Comment: (NOTE) The eGFR has been calculated using the CKD EPI equation. This calculation has not been validated in all clinical situations. eGFR's persistently <60 mL/min signify  possible Chronic Kidney Disease.    Anion gap 4 (L) 5 - 15    ABGS No results for input(s): PHART, PO2ART, TCO2, HCO3 in the last 72 hours.  Invalid input(s): PCO2 CULTURES No results found for this or any previous visit (from the past 240 hour(s)). Studies/Results: Ct Head Wo Contrast  Result Date: 10/01/2016 CLINICAL DATA:  Hallucinations EXAM: CT HEAD WITHOUT CONTRAST TECHNIQUE: Contiguous axial images were obtained from the base of the skull through the vertex without intravenous contrast. COMPARISON:  None. FINDINGS: Brain parenchyma, ventricular system, and extra-axial space are within normal limits. No mass effect, midline shift, or acute hemorrhage. Cranium is intact. Global atrophy and chronic ischemic changes are stable in the periventricular white matter. Sella turcica is prominent and fluid-filled. IMPRESSION: No acute intracranial pathology. Electronically Signed   By: Marybelle Killings M.D.   On: 10/01/2016 15:53    Medications:  Prior to Admission:  Prescriptions Prior to Admission  Medication Sig Dispense Refill Last Dose  . acetaminophen (TYLENOL) 500 MG tablet Take 500 mg by mouth every 6 (six) hours as needed for mild pain.   unknown at Unknown time  . ALPRAZolam (XANAX) 0.25 MG tablet Take 0.25 mg by mouth 4 (four) times daily.    09/30/2016 at Unknown  time  . divalproex (DEPAKOTE) 125 MG DR tablet Take 125 mg by mouth 2 (two) times daily.   09/30/2016 at Unknown time  . furosemide (LASIX) 40 MG tablet Take 40 mg by mouth every morning.   09/30/2016 at Unknown time  . INCRUSE ELLIPTA 62.5 MCG/INH AEPB    09/30/2016 at Unknown time  . Lactobacillus (ACIDOPHILUS EXTRA STRENGTH PO) Take 1 tablet by mouth 2 (two) times daily.   09/30/2016 at Unknown time  . nebivolol (BYSTOLIC) 5 MG tablet Take 5 mg by mouth daily.   09/30/2016 at 0800  . omeprazole (PRILOSEC) 20 MG capsule Take 20 mg by mouth 2 (two) times daily before a meal.    09/30/2016 at Unknown time  . traMADol (ULTRAM) 50 MG tablet Take 50 mg by mouth 2 (two) times daily.   09/30/2016 at Unknown time  . traZODone (DESYREL) 50 MG tablet Take 1 tablet (50 mg total) by mouth at bedtime as needed for sleep (insomnia). (Patient taking differently: Take 50 mg by mouth at bedtime. ) 30 tablet 12 09/30/2016 at Unknown time   Scheduled: . acidophilus  1 capsule Oral BID  . cefTRIAXone (ROCEPHIN)  IV  1 g Intravenous Q24H  . divalproex  125 mg Oral BID  . enoxaparin (LOVENOX) injection  30 mg Subcutaneous Q24H  . furosemide  40 mg Oral q morning - 10a  . nebivolol  5 mg Oral Daily  . pantoprazole  40 mg Oral Daily  . potassium chloride SA  30 mEq Oral BID  . traZODone  50 mg Oral QHS  . umeclidinium bromide  1 puff Inhalation Daily   Continuous: . 0.9 % NaCl with KCl 20 mEq / L 75 mL/hr at 10/02/16 0745   OFB:PZWCHENIDPOEU **OR** acetaminophen, albuterol, ALPRAZolam, ondansetron **OR** ondansetron (ZOFRAN) IV, polyethylene glycol, traMADol  Assesment: She was admitted with acute encephalopathy. I think this is probably related to metabolic encephalopathy from urinary tract infection. She is being treated for that and is doing better. She had visual hallucinations. She has some element of dementia at baseline and I think with the urinary tract infection and just made things worse. She has  hypertension and that is pretty well controlled. She was  hypokalemic on admission but that's better now. She has chronic kidney disease and I think she was somewhat dehydrated and that is improved. She is known to have an abdominal aortic aneurysm and is felt to be a poor surgical candidate. She has avascular necrosis of the hips and is also felt to be a poor surgical candidate for that Principal Problem:   Acute encephalopathy Active Problems:   Essential hypertension   Hypokalemia   AAA (abdominal aortic aneurysm) without rupture (HCC)   UTI (urinary tract infection)   Chronic kidney disease (CKD) stage G3a/A1, moderately decreased glomerular filtration rate (GFR) between 45-59 mL/min/1.73 square meter and albuminuria creatinine ratio less than 30 mg/g   Dementia   Delirium   Visual hallucinations   Chronic anxiety    Plan: Continue IV fluids IV antibiotics. Remove potassium from fluids. Potential discharge back to the skilled care facility tomorrow    LOS: 1 day   Orrie Lascano L 10/02/2016, 8:35 AM

## 2016-10-02 NOTE — Clinical Social Work Note (Signed)
Clinical Social Work Assessment  Patient Details  Name: [name] [name] MRN: [id number] Date of Birth: [birth year]  Date of referral:  10/02/16               Reason for consult:  Discharge Planning                Permission sought to share information with:    Permission granted to share information::     Name::        Agency::     Relationship::     Contact Information:     Housing/Transportation Living arrangements for the past 2 months:  [place] of Information:  Facility, Adult Children Patient Interpreter Needed:  None Criminal Activity/Legal Involvement Pertinent to Current Situation/Hospitalization:  No - Comment as needed Significant Relationships:  Adult Children Lives with:  Facility Resident Do you feel safe going back to the place where you live?  Yes Need for family participation in patient care:  Yes (Comment)  Care giving concerns:  Pt came to ED from SNF due to aggression, hallucination, and confusion.    Social Worker assessment / plan:  CSW attempted to meet with pt at bedside. Pt alert, but oriented to self and place only. Per [name], pt's daughter, pt has been a resident at [facility] for several months after being admitted from home. Pt came to ED for evaluation due to aggression, confusion, hallucinations, paranoia, and agitation, worsening over the past few weeks. [name] said that her medications were changed that is when they noted change in behavior. Psychiatry evaluated pt in ED as pt came in under IVC. Per notes, IVC rescinded and pt does not meet inpatient criteria. No IVC paperwork currently on chart. Pt has been calm with staff. Per Larene Beach at facility, pt is nursing level of care and okay to return. She requires assistance with ADLs and uses a wheelchair. [name] indicates pt will require long term placement.   Employment status:  Retired Forensic scientist:  Medicare PT Recommendations:  Not assessed at this time [place] /  Referral to community resources:  Other (Comment Required) (Return to [facility])  Patient/Family's Response to care:  Pt's daughter requests return to [facility] when medically stable.   Patient/Family's Understanding of and Emotional Response to Diagnosis, Current Treatment, and Prognosis:  Pt's daughter is aware of admission diagnosis and treatment plan. She shares that pt's medications were changed and they believe that this caused behavior change.   Emotional Assessment Appearance:  Appears stated age Attitude/Demeanor/Rapport:  Unable to Assess Affect (typically observed):  Unable to Assess Orientation:  Oriented to Self, Place Alcohol / Substance use:  Not Applicable Psych involvement (Current and /or in the community):  Yes (Comment) (psych evaluated pt in ED)  Discharge Needs  Concerns to be addressed:  Discharge Planning Concerns Readmission within the last 30 days:  No Current discharge risk:  Physical Impairment Barriers to Discharge:  Continued Medical Work up   Salome Arnt, Grandfather 10/02/2016, 10:42 AM [phone]

## 2016-10-02 NOTE — Care Management Note (Signed)
Case Management Note  Patient Details  Name: [place] Lewellen MRN: [id number] Date of Birth: [birth year]  Subjective/Objective:                  Pt admitted with AMS. She is from [facility] long term. Plan is for pt to return to [facility]. CSW is aware and will make arrangements for return to facility.   Action/Plan: No CM needs.   Expected Discharge Date:  10/04/16               Expected Discharge Plan:  Skilled Nursing Facility  In-House Referral:  Clinical Social Work  Discharge planning Services  CM Consult  Post Acute Care Choice:  NA Choice offered to:  NA  DME Arranged:    DME Agency:     HH Arranged:    [place] Agency:     Status of Service:  Completed, signed off  If discussed at [employer], dates discussed:    Additional Comments:  Sherald Barge, RN 10/02/2016, 10:23 AM

## 2016-10-02 NOTE — NC FL2 (Deleted)
[place] LEVEL OF CARE SCREENING TOOL     IDENTIFICATION  Patient Name: [name] [name] Birthdate: 1935/09/05 Sex: female Admission Date (Current Location): 09/30/2016  [place] and [place] Number:  [place] [id number] [place] and Address:  [facility] [address] [place]      Provider Number: [id number]  Attending Physician Name and Address:  Sinda Du, MD  Relative Name and Phone Number:       Current Level of Care: Hospital Recommended Level of Care: Nursing Facility Prior Approval Number:    Date Approved/Denied:   PASRR Number: [id number]  Discharge Plan: SNF    Current Diagnoses: Patient Active Problem List   Diagnosis Date Noted  . Dementia 10/01/2016  . Delirium 10/01/2016  . Visual hallucinations 10/01/2016  . Chronic anxiety 10/01/2016  . Abdominal aneurysm without mention of rupture 06/01/2014  . Abdominal pain, left lower quadrant 06/01/2014  . Failure to thrive 02/02/2014  . Generalized weakness 02/02/2014  . Gait difficulty 02/02/2014  . Confusion 02/02/2014  . Low back pain 02/02/2014  . Chest pressure 08/10/2013  . Abdominal pressure 08/10/2013  . Appendicitis, acute 05/28/2013  . Bradycardia 05/28/2013  . Chronic kidney disease (CKD) stage G3a/A1, moderately decreased glomerular filtration rate (GFR) between 45-59 mL/min/1.73 square meter and albuminuria creatinine ratio less than 30 mg/g 05/25/2013  . Abdominal pain 05/20/2013  . Nausea and vomiting 05/20/2013  . UTI (urinary tract infection) 05/20/2013  . Renal insufficiency 05/20/2013  . AAA (abdominal aortic aneurysm) without rupture ([place]) 02/09/2013  . RUQ abdominal pain 12/28/2012  . Failure to thrive in adult 12/28/2012  . Dehydration 12/28/2012  . Hypokalemia 12/28/2012  . Small bowel obstruction 09/02/2012  . Anemia 09/01/2012  . Peripheral vascular disease ([place]) 09/01/2012  . Essential hypertension   . COPD (chronic obstructive  pulmonary disease) ([place])   . Atrial arrhythmia   . AAA (abdominal aortic aneurysm) ([place])   . Arteriosclerotic cardiovascular disease (ASCVD)   . Tobacco abuse, in remission   . Pituitary microadenoma ([place])   . Acute encephalopathy 08/14/2012  . Pulmonary embolism ([place]) 08/14/2012  . Hyperlipidemia 05/24/2012  . ARTHRITIS, HIPS, BILATERAL 06/06/2010    Orientation RESPIRATION BLADDER Height & Weight     Self, Place  Normal Incontinent Weight: 146 lb 2.6 oz (66.3 kg) Height:  5\' 3"  (160 cm)  BEHAVIORAL SYMPTOMS/MOOD NEUROLOGICAL BOWEL NUTRITION STATUS  Other (Comment) (noted at facility. Since admission, pt has been calm and cooperative per staff.)  (n/a) Incontinent Diet (Heart healthy)  AMBULATORY STATUS COMMUNICATION OF NEEDS Skin   Extensive Assist Verbally Bruising                       Personal Care Assistance Level of Assistance  Bathing, Feeding, Dressing Bathing Assistance: Maximum assistance Feeding assistance: Limited assistance Dressing Assistance: Maximum assistance     Functional Limitations Info  Sight, Hearing, Speech Sight Info: Adequate Hearing Info: Adequate Speech Info: Adequate    SPECIAL CARE FACTORS FREQUENCY                       Contractures      Additional Factors Info  Psychotropic Code Status Info: Full code Allergies Info: Zoloft (Sertraline Hcl), Asa (Aspirin), Lidocaine, Sulfonamide Derivatives, Ativan (Lorazepam)  Psychotropic Info: Depakote, Trazodone   Isolation Precautions Info: 05/20/13 mrsa by pcr     Current Medications (10/02/2016):  This is the current hospital active medication list Current Facility-Administered Medications  Medication  Dose Route Frequency Provider Last Rate Last Dose  . 0.9 %  sodium chloride infusion   Intravenous Continuous Sinda Du, MD 50 mL/hr at 10/02/16 0849    . acetaminophen (TYLENOL) tablet 650 mg  650 mg Oral Q6H PRN Rexene Alberts, MD       Or  . acetaminophen (TYLENOL)  suppository 650 mg  650 mg Rectal Q6H PRN Rexene Alberts, MD      . acidophilus (RISAQUAD) capsule 1 capsule  1 capsule Oral BID Rexene Alberts, MD   1 capsule at 10/02/16 0844  . albuterol (PROVENTIL) (2.5 MG/3ML) 0.083% nebulizer solution 2.5 mg  2.5 mg Nebulization Q2H PRN Rexene Alberts, MD      . ALPRAZolam Duanne Moron) tablet 0.25 mg  0.25 mg Oral QID PRN Nat Christen, MD   0.25 mg at 10/02/16 0849  . cefTRIAXone (ROCEPHIN) 1 g in dextrose 5 % 50 mL IVPB  1 g Intravenous Q24H Rexene Alberts, MD      . divalproex (DEPAKOTE) DR tablet 125 mg  125 mg Oral BID Nat Christen, MD   125 mg at 10/01/16 2135  . enoxaparin (LOVENOX) injection 30 mg  30 mg Subcutaneous Q24H Rexene Alberts, MD   30 mg at 10/01/16 1925  . furosemide (LASIX) tablet 40 mg  40 mg Oral q morning - 10a Nat Christen, MD   40 mg at 10/02/16 0844  . nebivolol (BYSTOLIC) tablet 5 mg  5 mg Oral Daily Nat Christen, MD   5 mg at 10/01/16 1153  . ondansetron (ZOFRAN) tablet 4 mg  4 mg Oral Q6H PRN Rexene Alberts, MD       Or  . ondansetron [facility]) injection 4 mg  4 mg Intravenous Q6H PRN Rexene Alberts, MD      . pantoprazole (PROTONIX) EC tablet 40 mg  40 mg Oral Daily Nat Christen, MD   40 mg at 10/02/16 0844  . polyethylene glycol (MIRALAX / GLYCOLAX) packet 17 g  17 g Oral Daily PRN Rexene Alberts, MD      . potassium chloride SA (K-DUR,KLOR-CON) CR tablet 30 mEq  30 mEq Oral BID Rexene Alberts, MD   30 mEq at 10/02/16 0843  . traMADol (ULTRAM) tablet 50 mg  50 mg Oral Q12H PRN Gardiner Barefoot, NP   50 mg at 10/01/16 2135  . traZODone (DESYREL) tablet 50 mg  50 mg Oral QHS Nat Christen, MD   50 mg at 10/01/16 2135  . umeclidinium bromide (INCRUSE ELLIPTA) 62.5 MCG/INH 1 puff  1 puff Inhalation Daily Nat Christen, MD   1 puff at 10/02/16 0946     Discharge Medications: Please see discharge summary for a list of discharge medications.  Relevant Imaging Results:  Relevant Lab Results:   Additional Information    Salome Arnt,  [place]

## 2016-10-03 DIAGNOSIS — Z823 Family history of stroke: Secondary | ICD-10-CM | POA: Diagnosis not present

## 2016-10-03 DIAGNOSIS — F0391 Unspecified dementia with behavioral disturbance: Secondary | ICD-10-CM | POA: Diagnosis not present

## 2016-10-03 DIAGNOSIS — Z8249 Family history of ischemic heart disease and other diseases of the circulatory system: Secondary | ICD-10-CM | POA: Diagnosis not present

## 2016-10-03 DIAGNOSIS — Z808 Family history of malignant neoplasm of other organs or systems: Secondary | ICD-10-CM

## 2016-10-03 DIAGNOSIS — R410 Disorientation, unspecified: Secondary | ICD-10-CM | POA: Diagnosis not present

## 2016-10-03 LAB — BASIC METABOLIC PANEL
Anion gap: 6 (ref 5–15)
BUN: 18 mg/dL (ref 6–20)
CHLORIDE: 108 mmol/L (ref 101–111)
CO2: 25 mmol/L (ref 22–32)
CREATININE: 1.2 mg/dL — AB (ref 0.44–1.00)
Calcium: 9 mg/dL (ref 8.9–10.3)
GFR calc Af Amer: 48 mL/min — ABNORMAL LOW (ref >60)
GFR calc non Af Amer: 42 mL/min — ABNORMAL LOW (ref >60)
Glucose, Bld: 84 mg/dL (ref 65–99)
POTASSIUM: 4.6 mmol/L (ref 3.5–5.1)
Sodium: 139 mmol/L (ref 135–145)

## 2016-10-03 MED ORDER — DIVALPROEX SODIUM 250 MG PO DR TAB
250.0000 mg | DELAYED_RELEASE_TABLET | Freq: Two times a day (BID) | ORAL | Status: DC
  Administered 2016-10-03: 250 mg via ORAL
  Filled 2016-10-03: qty 1

## 2016-10-03 MED ORDER — RISPERIDONE 0.5 MG PO TABS
0.2500 mg | ORAL_TABLET | Freq: Two times a day (BID) | ORAL | Status: DC
  Administered 2016-10-03 – 2016-10-06 (×6): 0.25 mg via ORAL
  Filled 2016-10-03 (×7): qty 1

## 2016-10-03 MED ORDER — ALPRAZOLAM 0.5 MG PO TABS
0.5000 mg | ORAL_TABLET | Freq: Four times a day (QID) | ORAL | Status: DC | PRN
  Administered 2016-10-03: 0.5 mg via ORAL
  Filled 2016-10-03: qty 1

## 2016-10-03 MED ORDER — ALPRAZOLAM 0.25 MG PO TABS
0.2500 mg | ORAL_TABLET | Freq: Three times a day (TID) | ORAL | Status: DC | PRN
  Administered 2016-10-04 – 2016-10-05 (×3): 0.25 mg via ORAL
  Filled 2016-10-03 (×4): qty 1

## 2016-10-03 MED ORDER — DIVALPROEX SODIUM 125 MG PO DR TAB
125.0000 mg | DELAYED_RELEASE_TABLET | Freq: Two times a day (BID) | ORAL | Status: DC
  Administered 2016-10-03 – 2016-10-06 (×5): 125 mg via ORAL
  Filled 2016-10-03 (×10): qty 1

## 2016-10-03 MED ORDER — CEFUROXIME AXETIL 250 MG PO TABS: 250.0000 mg | ORAL_TABLET | Freq: Two times a day (BID) | ORAL | Status: DC

## 2016-10-03 NOTE — NC FL2 (Deleted)
[place] LEVEL OF CARE SCREENING TOOL     IDENTIFICATION  Patient Name: [name] [name] Birthdate: [birth year] Sex: female Admission Date (Current Location): 09/30/2016  [place] and [place] Number:  [place] [id number] [place] and Address:  [facility] [address] [place]      Provider Number: [id number]  Attending Physician Name and Address:  Sinda Du, MD  Relative Name and Phone Number:       Current Level of Care: Hospital Recommended Level of Care: Nursing Facility Prior Approval Number:    Date Approved/Denied:   PASRR Number: [id number]  Discharge Plan: SNF    Current Diagnoses: Patient Active Problem List   Diagnosis Date Noted  . Dementia 10/01/2016  . Delirium 10/01/2016  . Visual hallucinations 10/01/2016  . Chronic anxiety 10/01/2016  . Abdominal aneurysm without mention of rupture 06/01/2014  . Abdominal pain, left lower quadrant 06/01/2014  . Failure to thrive 02/02/2014  . Generalized weakness 02/02/2014  . Gait difficulty 02/02/2014  . Confusion 02/02/2014  . Low back pain 02/02/2014  . Chest pressure 08/10/2013  . Abdominal pressure 08/10/2013  . Appendicitis, acute 05/28/2013  . Bradycardia 05/28/2013  . Chronic kidney disease (CKD) stage G3a/A1, moderately decreased glomerular filtration rate (GFR) between 45-59 mL/min/1.73 square meter and albuminuria creatinine ratio less than 30 mg/g 05/25/2013  . Abdominal pain 05/20/2013  . Nausea and vomiting 05/20/2013  . UTI (urinary tract infection) 05/20/2013  . Renal insufficiency 05/20/2013  . AAA (abdominal aortic aneurysm) without rupture ([place]) 02/09/2013  . RUQ abdominal pain 12/28/2012  . Failure to thrive in adult 12/28/2012  . Dehydration 12/28/2012  . Hypokalemia 12/28/2012  . Small bowel obstruction 09/02/2012  . Anemia 09/01/2012  . Peripheral vascular disease ([place]) 09/01/2012  . Essential hypertension   . COPD (chronic obstructive  pulmonary disease) ([place])   . Atrial arrhythmia   . AAA (abdominal aortic aneurysm) ([place])   . Arteriosclerotic cardiovascular disease (ASCVD)   . Tobacco abuse, in remission   . Pituitary microadenoma ([place])   . Acute encephalopathy 08/14/2012  . Pulmonary embolism ([place]) 08/14/2012  . Hyperlipidemia 05/24/2012  . ARTHRITIS, HIPS, BILATERAL 06/06/2010    Orientation RESPIRATION BLADDER Height & Weight     Self, Place  Normal Incontinent Weight: 148 lb 13 oz (67.5 kg) Height:  5\' 3"  (160 cm)  BEHAVIORAL SYMPTOMS/MOOD NEUROLOGICAL BOWEL NUTRITION STATUS   (Pt has been agitated and yelling out. )  (n/a) Incontinent Diet (Heart healthy)  AMBULATORY STATUS COMMUNICATION OF NEEDS Skin   Extensive Assist Verbally Bruising                       Personal Care Assistance Level of Assistance  Bathing, Feeding, Dressing Bathing Assistance: Maximum assistance Feeding assistance: Limited assistance Dressing Assistance: Maximum assistance     Functional Limitations Info  Sight, Hearing, Speech Sight Info: Adequate Hearing Info: Adequate Speech Info: Adequate    SPECIAL CARE FACTORS FREQUENCY                       Contractures      Additional Factors Info  Psychotropic Code Status Info: Full code Allergies Info: Zoloft (Sertraline Hcl), Asa (Aspirin), Lidocaine, Sulfonamide Derivatives, Ativan (Lorazepam)  Psychotropic Info: Depakote, Trazodone   Isolation Precautions Info: 05/20/13 mrsa by pcr     Current Medications (10/03/2016):  This is the current hospital active medication list Current Facility-Administered Medications  Medication Dose Route Frequency Provider Last Rate  Last Dose  . 0.9 %  sodium chloride infusion   Intravenous Continuous Sinda Du, MD 50 mL/hr at 10/03/16 0450    . acetaminophen (TYLENOL) tablet 650 mg  650 mg Oral Q6H PRN Rexene Alberts, MD       Or  . acetaminophen (TYLENOL) suppository 650 mg  650 mg Rectal Q6H PRN Rexene Alberts, MD       . acidophilus (RISAQUAD) capsule 1 capsule  1 capsule Oral BID Rexene Alberts, MD   1 capsule at 10/03/16 1126  . albuterol (PROVENTIL) (2.5 MG/3ML) 0.083% nebulizer solution 2.5 mg  2.5 mg Nebulization Q2H PRN Rexene Alberts, MD      . ALPRAZolam Duanne Moron) tablet 0.5 mg  0.5 mg Oral QID PRN Sinda Du, MD   0.5 mg at 10/03/16 1126  . cefTRIAXone (ROCEPHIN) 1 g in dextrose 5 % 50 mL IVPB  1 g Intravenous Q24H Rexene Alberts, MD 100 mL/hr at 10/02/16 1550 1 g at 10/02/16 1550  . divalproex (DEPAKOTE) DR tablet 250 mg  250 mg Oral BID Sinda Du, MD   250 mg at 10/03/16 1127  . enoxaparin (LOVENOX) injection 30 mg  30 mg Subcutaneous Q24H Rexene Alberts, MD   30 mg at 10/02/16 1515  . furosemide (LASIX) tablet 40 mg  40 mg Oral q morning - 10a Nat Christen, MD   40 mg at 10/03/16 1126  . nebivolol (BYSTOLIC) tablet 5 mg  5 mg Oral Daily Nat Christen, MD   5 mg at 10/02/16 1045  . ondansetron (ZOFRAN) tablet 4 mg  4 mg Oral Q6H PRN Rexene Alberts, MD       Or  . ondansetron [facility]) injection 4 mg  4 mg Intravenous Q6H PRN Rexene Alberts, MD      . pantoprazole (PROTONIX) EC tablet 40 mg  40 mg Oral Daily Nat Christen, MD   40 mg at 10/03/16 1125  . polyethylene glycol (MIRALAX / GLYCOLAX) packet 17 g  17 g Oral Daily PRN Rexene Alberts, MD      . potassium chloride SA (K-DUR,KLOR-CON) CR tablet 30 mEq  30 mEq Oral BID Rexene Alberts, MD   30 mEq at 10/03/16 1125  . traMADol (ULTRAM) tablet 50 mg  50 mg Oral Q12H PRN Gardiner Barefoot, NP   50 mg at 10/02/16 2318  . traZODone (DESYREL) tablet 50 mg  50 mg Oral QHS Nat Christen, MD   50 mg at 10/02/16 2317  . umeclidinium bromide (INCRUSE ELLIPTA) 62.5 MCG/INH 1 puff  1 puff Inhalation Daily Nat Christen, MD   1 puff at 10/03/16 [id number]     Discharge Medications: Please see discharge summary for a list of discharge medications.  Relevant Imaging Results:  Relevant Lab Results:   Additional Information    Salome Arnt,  [place]

## 2016-10-03 NOTE — Clinical Social Work Note (Signed)
Pt more agitated this morning and yelling out. Discussed with MD. Plan is to reconsult psychiatry. If cleared, probable d/c tomorrow. CSW updated [employer].   Benay Pike, [place]

## 2016-10-03 NOTE — Progress Notes (Addendum)
Subjective: She is mildly confused this morning. When I examined her she was not agitated but she became more agitated later which quickly passed. This did not require any treatment. She has no complaints of chest pain abdominal pain nausea vomiting diarrhea. She still has some dysuria. No headache.  Objective: Vital signs in last 24 hours: Temp:  [97.7 F (36.5 C)-98.1 F (36.7 C)] 97.7 F (36.5 C) (10/13 0448) Pulse Rate:  [50-64] 50 (10/13 0448) Resp:  [20] 20 (10/13 0448) BP: (105-142)/(74-76) 142/74 (10/13 0448) SpO2:  [91 %-96 %] 96 % (10/13 0448) Weight:  [67.5 kg (148 lb 13 oz)] 67.5 kg (148 lb 13 oz) (10/13 0500) Weight change: 1 kg (2 lb 3.3 oz) Last BM Date: 10/02/16  Intake/Output from previous day: 10/12 0701 - 10/13 0700 In: 1219.2 [P.O.:360; I.V.:809.2; IV Piggyback:50] Out: 2 [Urine:1; Stool:1]  PHYSICAL EXAM General appearance: alert, cooperative, no distress and At the time of my exam but later she became agitated Resp: clear to auscultation bilaterally Cardio: regular rate and rhythm, S1, S2 normal, no murmur, click, rub or gallop GI: soft, non-tender; bowel sounds normal; no masses,  no organomegaly Extremities: extremities normal, atraumatic, no cyanosis or edema Skin is warm and dry. Psychiatric as above she did get somewhat agitated but now is calm and was calm when I examined her. Musculoskeletal she can move all 4 extremities she has some limitation of range of motion of both hips. Neurologic with the exception of the confusion this is grossly intact ears nose mouth and throat her mucous membranes are moist  Lab Results:  Results for orders placed or performed during the hospital encounter of 09/30/16 (from the past 48 hour(s))  Vitamin B12     Status: None   Collection Time: 10/01/16  3:10 PM  Result Value Ref Range   Vitamin B-12 507 180 - 914 pg/mL    Comment: (NOTE) This assay is not validated for testing neonatal or myeloproliferative syndrome  specimens for Vitamin B12 levels. Performed at [facility]   Hemoglobin A1c     Status: None   Collection Time: 10/01/16  3:10 PM  Result Value Ref Range   Hgb A1c MFr Bld 5.3 4.8 - 5.6 %    Comment: (NOTE)         Pre-diabetes: 5.7 - 6.4         Diabetes: >6.4         Glycemic control for adults with diabetes: <7.0    Mean Plasma Glucose 105 mg/dL    Comment: (NOTE) Performed At: [facility] [place], [place] [id number] Lindon Romp MD [id number]   Magnesium     Status: None   Collection Time: 10/01/16  3:12 PM  Result Value Ref Range   Magnesium 2.1 1.7 - 2.4 mg/dL  Ammonia     Status: None   Collection Time: 10/01/16  3:12 PM  Result Value Ref Range   Ammonia 20 9 - 35 umol/L  TSH     Status: None   Collection Time: 10/01/16  3:15 PM  Result Value Ref Range   TSH 2.830 0.350 - 4.500 uIU/mL    Comment: Performed by a 3rd Generation assay with a functional sensitivity of <=0.01 uIU/mL.  RPR     Status: None   Collection Time: 10/01/16  3:15 PM  Result Value Ref Range   RPR Ser Ql Non Reactive Non Reactive    Comment: (NOTE) Performed At: [facility] [address]  [address] [place], [place] [id number] Lindon Romp MD [id number]   Hepatic function panel     Status: Abnormal   Collection Time: 10/01/16  3:15 PM  Result Value Ref Range   Total Protein 6.8 6.5 - 8.1 g/dL   Albumin 3.4 (L) 3.5 - 5.0 g/dL   AST 16 15 - 41 U/L   ALT 11 (L) 14 - 54 U/L   Alkaline Phosphatase 104 38 - 126 U/L   Total Bilirubin 0.5 0.3 - 1.2 mg/dL   Bilirubin, Direct 0.1 0.1 - 0.5 mg/dL   Indirect Bilirubin 0.4 0.3 - 0.9 mg/dL  Troponin I     Status: None   Collection Time: 10/01/16  3:27 PM  Result Value Ref Range   Troponin I <0.03 <0.03 ng/mL  CBC     Status: None   Collection Time: 10/02/16  6:04 AM  Result Value Ref Range   WBC 7.7 4.0 - 10.5 K/uL   RBC 4.35 3.87 - 5.11 MIL/uL   Hemoglobin 12.3 12.0 - 15.0 g/dL   HCT 39.5 36.0 - 46.0  %   MCV 90.8 78.0 - 100.0 fL   MCH 28.3 26.0 - 34.0 pg   MCHC 31.1 30.0 - 36.0 g/dL   RDW 15.0 11.5 - 15.5 %   Platelets 150 150 - 400 K/uL  Basic metabolic panel     Status: Abnormal   Collection Time: 10/02/16  6:04 AM  Result Value Ref Range   Sodium 141 135 - 145 mmol/L   Potassium 4.8 3.5 - 5.1 mmol/L    Comment: DELTA CHECK NOTED   Chloride 111 101 - 111 mmol/L   CO2 26 22 - 32 mmol/L   Glucose, Bld 90 65 - 99 mg/dL   BUN 20 6 - 20 mg/dL   Creatinine, Ser 1.15 (H) 0.44 - 1.00 mg/dL   Calcium 8.9 8.9 - 10.3 mg/dL   GFR calc non Af Amer 44 (L) >60 mL/min   GFR calc Af Amer 51 (L) >60 mL/min    Comment: (NOTE) The eGFR has been calculated using the CKD EPI equation. This calculation has not been validated in all clinical situations. eGFR's persistently <60 mL/min signify possible Chronic Kidney Disease.    Anion gap 4 (L) 5 - 15  MRSA PCR Screening     Status: None   Collection Time: 10/02/16  5:31 PM  Result Value Ref Range   MRSA by PCR NEGATIVE NEGATIVE    Comment:        The GeneXpert MRSA Assay (FDA approved for NASAL specimens only), is one component of a comprehensive MRSA colonization surveillance program. It is not intended to diagnose MRSA infection nor to guide or monitor treatment for MRSA infections.   Basic metabolic panel     Status: Abnormal   Collection Time: 10/03/16  6:12 AM  Result Value Ref Range   Sodium 139 135 - 145 mmol/L   Potassium 4.6 3.5 - 5.1 mmol/L   Chloride 108 101 - 111 mmol/L   CO2 25 22 - 32 mmol/L   Glucose, Bld 84 65 - 99 mg/dL   BUN 18 6 - 20 mg/dL   Creatinine, Ser 1.20 (H) 0.44 - 1.00 mg/dL   Calcium 9.0 8.9 - 10.3 mg/dL   GFR calc non Af Amer 42 (L) >60 mL/min   GFR calc Af Amer 48 (L) >60 mL/min    Comment: (NOTE) The eGFR has been calculated using the CKD EPI equation. This calculation has not been validated  in all clinical situations. eGFR's persistently <60 mL/min signify possible Chronic Kidney Disease.     Anion gap 6 5 - 15    ABGS No results for input(s): PHART, PO2ART, TCO2, HCO3 in the last 72 hours.  Invalid input(s): PCO2 CULTURES Recent Results (from the past 240 hour(s))  Urine culture     Status: Abnormal   Collection Time: 10/01/16  2:01 AM  Result Value Ref Range Status   Specimen Description URINE, CLEAN CATCH  Final   Special Requests NONE  Final   Culture MULTIPLE SPECIES PRESENT, SUGGEST RECOLLECTION (A)  Final   Report Status 10/02/2016 FINAL  Final  MRSA PCR Screening     Status: None   Collection Time: 10/02/16  5:31 PM  Result Value Ref Range Status   MRSA by PCR NEGATIVE NEGATIVE Final    Comment:        The GeneXpert MRSA Assay (FDA approved for NASAL specimens only), is one component of a comprehensive MRSA colonization surveillance program. It is not intended to diagnose MRSA infection nor to guide or monitor treatment for MRSA infections.    Studies/Results: Ct Head Wo Contrast  Result Date: 10/01/2016 CLINICAL DATA:  Hallucinations EXAM: CT HEAD WITHOUT CONTRAST TECHNIQUE: Contiguous axial images were obtained from the base of the skull through the vertex without intravenous contrast. COMPARISON:  None. FINDINGS: Brain parenchyma, ventricular system, and extra-axial space are within normal limits. No mass effect, midline shift, or acute hemorrhage. Cranium is intact. Global atrophy and chronic ischemic changes are stable in the periventricular white matter. Sella turcica is prominent and fluid-filled. IMPRESSION: No acute intracranial pathology. Electronically Signed   By: Marybelle Killings M.D.   On: 10/01/2016 15:53    Medications:  Prior to Admission:  Prescriptions Prior to Admission  Medication Sig Dispense Refill Last Dose  . acetaminophen (TYLENOL) 500 MG tablet Take 500 mg by mouth every 6 (six) hours as needed for mild pain.   unknown at Unknown time  . ALPRAZolam (XANAX) 0.25 MG tablet Take 0.25 mg by mouth 4 (four) times daily.    09/30/2016 at  Unknown time  . divalproex (DEPAKOTE) 125 MG DR tablet Take 125 mg by mouth 2 (two) times daily.   09/30/2016 at Unknown time  . furosemide (LASIX) 40 MG tablet Take 40 mg by mouth every morning.   09/30/2016 at Unknown time  . INCRUSE ELLIPTA 62.5 MCG/INH AEPB    09/30/2016 at Unknown time  . Lactobacillus (ACIDOPHILUS EXTRA STRENGTH PO) Take 1 tablet by mouth 2 (two) times daily.   09/30/2016 at Unknown time  . nebivolol (BYSTOLIC) 5 MG tablet Take 5 mg by mouth daily.   09/30/2016 at 0800  . omeprazole (PRILOSEC) 20 MG capsule Take 20 mg by mouth 2 (two) times daily before a meal.    09/30/2016 at Unknown time  . traMADol (ULTRAM) 50 MG tablet Take 50 mg by mouth 2 (two) times daily.   09/30/2016 at Unknown time  . traZODone (DESYREL) 50 MG tablet Take 1 tablet (50 mg total) by mouth at bedtime as needed for sleep (insomnia). (Patient taking differently: Take 50 mg by mouth at bedtime. ) 30 tablet 12 09/30/2016 at Unknown time   Scheduled: . acidophilus  1 capsule Oral BID  . cefTRIAXone (ROCEPHIN)  IV  1 g Intravenous Q24H  . divalproex  125 mg Oral BID  . enoxaparin (LOVENOX) injection  30 mg Subcutaneous Q24H  . furosemide  40 mg Oral q morning - 10a  .  nebivolol  5 mg Oral Daily  . pantoprazole  40 mg Oral Daily  . potassium chloride SA  30 mEq Oral BID  . traZODone  50 mg Oral QHS  . umeclidinium bromide  1 puff Inhalation Daily   Continuous: . sodium chloride 50 mL/hr at 10/03/16 0450   ATF:[id number] **OR** acetaminophen, albuterol, ALPRAZolam, ondansetron **OR** ondansetron (ZOFRAN) IV, polyethylene glycol, traMADol  Assesment: She was admitted with acute encephalopathy presumably from urinary tract infection. She has dementia at baseline. She is on medication for urinary tract infection and she is doing better. She is not felt to be a threat to others and I think she is at a point now where she can be transferred safely back to the skilled care facility Principal Problem:    Acute encephalopathy Active Problems:   Essential hypertension   Hypokalemia   AAA (abdominal aortic aneurysm) without rupture (HCC)   UTI (urinary tract infection)   Chronic kidney disease (CKD) stage G3a/A1, moderately decreased glomerular filtration rate (GFR) between 45-59 mL/min/1.73 square meter and albuminuria creatinine ratio less than 30 mg/g   Dementia   Delirium   Visual hallucinations   Chronic anxiety    Plan: Transfer back to skilled care facility    LOS: 2 days   Laelani Vasko L 10/03/2016, 8:22 AM Addendum: After I dictated this note and was in the process of arranging for discharge she became much more agitated and threatening. I don't think it's safe now to transfer her back to the skilled care facility. I am going to reconsult psychiatry. Her urine is growing multiple species so I think it may not be necessarily a urinary tract infection but is psychiatric in nature

## 2016-10-03 NOTE — Consult Note (Signed)
Telepsych Consultation   Reason for Consult:  Behavioral disturbance Referring Physician:  EDP Patient Identification: [name] [name] MRN:  [id number] Principal Diagnosis: Acute encephalopathy and exacerbation of underlying dementia  Diagnosis:   Patient Active Problem List   Diagnosis Date Noted  . Delirium [R41.0] 10/01/2016    Priority: High  . Dementia [F03.90] 10/01/2016    Priority: Medium  . Visual hallucinations [R44.1] 10/01/2016  . Chronic anxiety [F41.9] 10/01/2016  . Abdominal aneurysm without mention of rupture [I71.4] 06/01/2014  . Abdominal pain, left lower quadrant [R10.32] 06/01/2014  . Failure to thrive [IMO0002] 02/02/2014  . Generalized weakness [R53.1] 02/02/2014  . Gait difficulty [R26.9] 02/02/2014  . Confusion [R41.0] 02/02/2014  . Low back pain [M54.5] 02/02/2014  . Chest pressure [R07.89] 08/10/2013  . Abdominal pressure [R10.9] 08/10/2013  . Appendicitis, acute [K35.80] 05/28/2013  . Bradycardia [R00.1] 05/28/2013  . Chronic kidney disease (CKD) stage G3a/A1, moderately decreased glomerular filtration rate (GFR) between 45-59 mL/min/1.73 square meter and albuminuria creatinine ratio less than 30 mg/g [N18.3] 05/25/2013  . Abdominal pain [R10.9] 05/20/2013  . Nausea and vomiting [R11.2] 05/20/2013  . UTI (urinary tract infection) [N39.0] 05/20/2013  . Renal insufficiency [N28.9] 05/20/2013  . AAA (abdominal aortic aneurysm) without rupture ([place]) [I71.4] 02/09/2013  . RUQ abdominal pain [R10.11] 12/28/2012  . Failure to thrive in adult [R62.7] 12/28/2012  . Dehydration [E86.0] 12/28/2012  . Hypokalemia [E87.6] 12/28/2012  . Small bowel obstruction [K56.609] 09/02/2012  . Anemia [D64.9] 09/01/2012  . Peripheral vascular disease ([place]) [I73.9] 09/01/2012  . Essential hypertension [I10]   . COPD (chronic obstructive pulmonary disease) ([place]) [J44.9]   . Atrial arrhythmia [I49.8]   . AAA (abdominal aortic aneurysm) ([place]) [I71.4]   . Arteriosclerotic  cardiovascular disease (ASCVD) [I25.10]   . Tobacco abuse, in remission [F17.201]   . Pituitary microadenoma ([place]) [D35.2]   . Acute encephalopathy [G93.40] 08/14/2012  . Pulmonary embolism ([place]) [I26.99] 08/14/2012  . Hyperlipidemia [E78.5] 05/24/2012  . ARTHRITIS, HIPS, BILATERAL [M16.10] 06/06/2010    Total Time spent with patient: 30 minutes   Subjective:   [name] Gose is a 81 y.o. female patient admitted with reports of a change in behavior in her nursing facility. Upon arrival at the ED, it was found that she also has a UTI. Pt seen and chart reviewed on 10/01/16 for initial psychiatry consult. Pt is alert/oriented to self and place only, mildly agitated, cooperative, and appropriate to situation. Pt denies suicidal/homicidal ideation and does not appear to be responding to internal stimuli.   Pt seen again today and her symptoms are worsening with agitation and talking to people who are not there. Per nursing reports, they appear to be names of family members whom she has not seen for years but they are real people she is referring to. Through review of the [facility], there are some medication changes that would benefit the pt at this time while we seek geropsych placement. If the pt improves dramatically while being treated by internal med, feel free to call for a reassessment for placement. However, at this time, we are faxing referrals as mentioned.   HPI:  I have reviewed and concur with HPI elements below, modified as follows: [name] [name] is an 81 y.o. widowed female who presents to [facility] ED under involuntary commitment taken out by staff at [facility]. Pt is accompanied by her daughter/guardian Diamantina Providence [phone]. Tonight Pt became aggressive with another resident and was yelling and screaming. [name] states that for  the past 3-4 weeks Pt has said she saw people who were not there, believes her niece is living with her and killing babies and that she is  still living with her son in a trailer and that he wants to kill her. Pt acknowledges she believes these things. Pt acknowledges symptoms including crying spells, fatigue, irritability and feeling nervous and lonely. She reports poor sleep and decreased eating. Pt denies current suicidal ideation, thoughts of dying or any history of suicide attempts. She denies thoughts of wanting to harm others and denies history of violence. She has no know history of substance abuse.  Pt says she hurts all over and doesn't like there as so many people living in her residence. [name] states Pt was living with her until July 2017 when she went to nursing home. [name] states nursing home staff were giving Pt Zoloft and Ativan and that Pt is allergic to both medications. [name] says Pt doesn't normally have behavioral disturbance unless something is wrong with her medications or Pt has a urinary tract infection. Ms Burney Gauze states Pt has been psychiatrically hospitalized in the past at [facility] and [facility] for mood disturbance. She states Pt has a third grade education, has never been employed and has been windowed since 1981. Pt uses a wheelchair and needs some assistance with bathing.   Pt evaluated by me 2 days ago in the ED for which recommendations were made. Pt has become somewhat worse in the past 24 hours and was seen again today as above on 10/03/16.   Past Psychiatric History: Dementia  Risk to Self: Suicidal Ideation: No Suicidal Intent: No Is patient at risk for suicide?: No Suicidal Plan?: No Access to Means: No What has been your use of drugs/alcohol within the last 12 months?: None How many times?: 0 Other Self Harm Risks: None Triggers for Past Attempts: None known Intentional Self Injurious Behavior: None Risk to Others: Homicidal Ideation: No Thoughts of Harm to Others: No Current Homicidal Intent: No Current Homicidal Plan: No Access to Homicidal Means:  No Identified Victim: None History of harm to others?: No Assessment of Violence: On admission Violent Behavior Description: Pt aggressive with nursing home staff Does patient have access to weapons?: No Criminal Charges Pending?: No Does patient have a court date: No Prior Inpatient Therapy: Prior Inpatient Therapy: Yes Prior Therapy Dates: Unknown Prior Therapy Facilty/Provider(s): [facility], [place] Reason for Treatment: Dementia, mood disturbance Prior Outpatient Therapy: Prior Outpatient Therapy: Yes Prior Therapy Dates: Currently Prior Therapy Facilty/Provider(s): Dr. Luan Pulling Reason for Treatment: Dementia, mood disturbance Does patient have an ACCT team?: No Does patient have Intensive In-House Services?  : No Does patient have [facility] services? : No Does patient have P4CC services?: No  Past Medical History:  Past Medical History:  Diagnosis Date  . AAA (abdominal aortic aneurysm) ([place])    infrarenal; 07/2012: Diameter of 5.4 cm  . AAA (abdominal aortic aneurysm) ([place])   . Anemia   . Arteriosclerotic cardiovascular disease (ASCVD)    Cardiac cath in 03/2000->  80% LAD, 70-80% RCA;  stent x1 in the LAD and x2 in the RCA with excellent angiographic outcome; repeat catheterization in late 2001 and 2003 revealed no restenosis; EF-50%  . Atrial arrhythmia    Atrial fibrillation and atrial flutter diagnosed in the past;?  PSVT  . Chronic kidney disease (CKD) stage G3a/A1, moderately decreased glomerular filtration rate (GFR) between 45-59 mL/min/1.73 square meter and albuminuria creatinine ratio less than 30  mg/g 05/25/2013  . COPD (chronic obstructive pulmonary disease) ([place])    multiple hospitalizations for exacerbations with acute bronchitis  . Coronary artery disease   . Degenerative joint disease    Bilateral hips  . Dementia   . Depression with anxiety   . DJD (degenerative joint disease)   . Encephalopathy   . Gall stones   . GERD (gastroesophageal reflux  disease)    Hiatal hernia  . Hyperlipidemia   . Hyperlipidemia   . Hypernatremia   . Hypertension   . Hypokalemia   . Ileus ([place])   . Nephrolithiasis   . Pituitary microadenoma ([place])    transsphenoidal resection in 09/2007  . Pulmonary embolism ([place]) 08/14/2012  . PVD (peripheral vascular disease) ([place])   . Renal failure   . SBO (small bowel obstruction)   . Tobacco abuse, in remission    Quit in 2006; total consumption of 50-75 pack years    Past Surgical History:  Procedure Laterality Date  . APPENDECTOMY N/A 05/25/2013   Procedure: APPENDECTOMY;  Surgeon: Donato Heinz, MD;  Location: [facility];  Service: General;  Laterality: N/A;  . CARDIAC CATHETERIZATION  2001   post PTCA and stenting of her LAD and RCA  . KIDNEY STONE SURGERY    . TRANSPHENOIDAL / TRANSNASAL HYPOPHYSECTOMY / RESECTION PITUITARY TUMOR  09/2007   Family History:  Family History  Problem Relation Age of Onset  . Heart disease Mother   . Hyperlipidemia Mother   . Hypertension Mother   . Stroke Mother   . Cancer Brother    Family Psychiatric  History: MDD Social History:  History  Alcohol Use No     History  Drug Use No    Social History   Social History  . Marital status: Widowed    Spouse name: N/A  . Number of children: N/A  . Years of education: N/A   Social History Main Topics  . Smoking status: Former Smoker    Packs/day: 1.50    Years: 50.00    Types: Cigarettes    Quit date: 05/27/2005  . Smokeless tobacco: Former Systems developer    Types: Snuff    Quit date: 01/11/2008  . Alcohol use No  . Drug use: No  . Sexual activity: Not Currently   Other Topics Concern  . None   Social History Narrative  . None   Additional Social History:    Allergies:   Allergies  Allergen Reactions  . Zoloft [Sertraline Hcl] Anxiety  . Asa [Aspirin] Other (See Comments)    "my ears bleed"  . Lidocaine Other (See Comments)    unknown  . Sulfonamide Derivatives Other (See Comments)    unknown  .  Ativan [Lorazepam] Anxiety    Per daughter    Labs:  Results for orders placed or performed during the hospital encounter of 09/30/16 (from the past 48 hour(s))  Vitamin B12     Status: None   Collection Time: 10/01/16  3:10 PM  Result Value Ref Range   Vitamin B-12 507 180 - 914 pg/mL    Comment: (NOTE) This assay is not validated for testing neonatal or myeloproliferative syndrome specimens for Vitamin B12 levels. Performed at [facility]   Hemoglobin A1c     Status: None   Collection Time: 10/01/16  3:10 PM  Result Value Ref Range   Hgb A1c MFr Bld 5.3 4.8 - 5.6 %    Comment: (NOTE)         Pre-diabetes:  5.7 - 6.4         Diabetes: >6.4         Glycemic control for adults with diabetes: <7.0    Mean Plasma Glucose 105 mg/dL    Comment: (NOTE) Performed At: [facility] [place], [place] [id number] Lindon Romp MD [id number]   Magnesium     Status: None   Collection Time: 10/01/16  3:12 PM  Result Value Ref Range   Magnesium 2.1 1.7 - 2.4 mg/dL  Ammonia     Status: None   Collection Time: 10/01/16  3:12 PM  Result Value Ref Range   Ammonia 20 9 - 35 umol/L  TSH     Status: None   Collection Time: 10/01/16  3:15 PM  Result Value Ref Range   TSH 2.830 0.350 - 4.500 uIU/mL    Comment: Performed by a 3rd Generation assay with a functional sensitivity of <=0.01 uIU/mL.  RPR     Status: None   Collection Time: 10/01/16  3:15 PM  Result Value Ref Range   RPR Ser Ql Non Reactive Non Reactive    Comment: (NOTE) Performed At: [facility] [place], [place] [id number] Lindon Romp MD [id number]   Hepatic function panel     Status: Abnormal   Collection Time: 10/01/16  3:15 PM  Result Value Ref Range   Total Protein 6.8 6.5 - 8.1 g/dL   Albumin 3.4 (L) 3.5 - 5.0 g/dL   AST 16 15 - 41 U/L   ALT 11 (L) 14 - 54 U/L   Alkaline Phosphatase 104 38 - 126 U/L   Total Bilirubin 0.5 0.3 - 1.2 mg/dL   Bilirubin,  Direct 0.1 0.1 - 0.5 mg/dL   Indirect Bilirubin 0.4 0.3 - 0.9 mg/dL  Troponin I     Status: None   Collection Time: 10/01/16  3:27 PM  Result Value Ref Range   Troponin I <0.03 <0.03 ng/mL  CBC     Status: None   Collection Time: 10/02/16  6:04 AM  Result Value Ref Range   WBC 7.7 4.0 - 10.5 K/uL   RBC 4.35 3.87 - 5.11 MIL/uL   Hemoglobin 12.3 12.0 - 15.0 g/dL   HCT 39.5 36.0 - 46.0 %   MCV 90.8 78.0 - 100.0 fL   MCH 28.3 26.0 - 34.0 pg   MCHC 31.1 30.0 - 36.0 g/dL   RDW 15.0 11.5 - 15.5 %   Platelets 150 150 - 400 K/uL  Basic metabolic panel     Status: Abnormal   Collection Time: 10/02/16  6:04 AM  Result Value Ref Range   Sodium 141 135 - 145 mmol/L   Potassium 4.8 3.5 - 5.1 mmol/L    Comment: DELTA CHECK NOTED   Chloride 111 101 - 111 mmol/L   CO2 26 22 - 32 mmol/L   Glucose, Bld 90 65 - 99 mg/dL   BUN 20 6 - 20 mg/dL   Creatinine, Ser 1.15 (H) 0.44 - 1.00 mg/dL   Calcium 8.9 8.9 - 10.3 mg/dL   GFR calc non Af Amer 44 (L) >60 mL/min   GFR calc Af Amer 51 (L) >60 mL/min    Comment: (NOTE) The eGFR has been calculated using the CKD EPI equation. This calculation has not been validated in all clinical situations. eGFR's persistently <60 mL/min signify possible Chronic Kidney Disease.    Anion gap 4 (L) 5 - 15  MRSA PCR Screening  Status: None   Collection Time: 10/02/16  5:31 PM  Result Value Ref Range   MRSA by PCR NEGATIVE NEGATIVE    Comment:        The GeneXpert MRSA Assay (FDA approved for NASAL specimens only), is one component of a comprehensive MRSA colonization surveillance program. It is not intended to diagnose MRSA infection nor to guide or monitor treatment for MRSA infections.   Basic metabolic panel     Status: Abnormal   Collection Time: 10/03/16  6:12 AM  Result Value Ref Range   Sodium 139 135 - 145 mmol/L   Potassium 4.6 3.5 - 5.1 mmol/L   Chloride 108 101 - 111 mmol/L   CO2 25 22 - 32 mmol/L   Glucose, Bld 84 65 - 99 mg/dL   BUN 18  6 - 20 mg/dL   Creatinine, Ser 1.20 (H) 0.44 - 1.00 mg/dL   Calcium 9.0 8.9 - 10.3 mg/dL   GFR calc non Af Amer 42 (L) >60 mL/min   GFR calc Af Amer 48 (L) >60 mL/min    Comment: (NOTE) The eGFR has been calculated using the CKD EPI equation. This calculation has not been validated in all clinical situations. eGFR's persistently <60 mL/min signify possible Chronic Kidney Disease.    Anion gap 6 5 - 15    Current Facility-Administered Medications  Medication Dose Route Frequency Provider Last Rate Last Dose  . 0.9 %  sodium chloride infusion   Intravenous Continuous Sinda Du, MD 50 mL/hr at 10/03/16 0450    . acetaminophen (TYLENOL) tablet 650 mg  650 mg Oral Q6H PRN Rexene Alberts, MD       Or  . acetaminophen (TYLENOL) suppository 650 mg  650 mg Rectal Q6H PRN Rexene Alberts, MD      . acidophilus (RISAQUAD) capsule 1 capsule  1 capsule Oral BID Rexene Alberts, MD   1 capsule at 10/02/16 2317  . albuterol (PROVENTIL) (2.5 MG/3ML) 0.083% nebulizer solution 2.5 mg  2.5 mg Nebulization Q2H PRN Rexene Alberts, MD      . ALPRAZolam Duanne Moron) tablet 0.5 mg  0.5 mg Oral QID PRN Sinda Du, MD      . cefTRIAXone (ROCEPHIN) 1 g in dextrose 5 % 50 mL IVPB  1 g Intravenous Q24H Rexene Alberts, MD 100 mL/hr at 10/02/16 1550 1 g at 10/02/16 1550  . divalproex (DEPAKOTE) DR tablet 250 mg  250 mg Oral BID Sinda Du, MD      . enoxaparin (LOVENOX) injection 30 mg  30 mg Subcutaneous Q24H Rexene Alberts, MD   30 mg at 10/02/16 1515  . furosemide (LASIX) tablet 40 mg  40 mg Oral q morning - 10a Nat Christen, MD   40 mg at 10/02/16 0844  . nebivolol (BYSTOLIC) tablet 5 mg  5 mg Oral Daily Nat Christen, MD   5 mg at 10/02/16 1045  . ondansetron (ZOFRAN) tablet 4 mg  4 mg Oral Q6H PRN Rexene Alberts, MD       Or  . ondansetron [facility]) injection 4 mg  4 mg Intravenous Q6H PRN Rexene Alberts, MD      . pantoprazole (PROTONIX) EC tablet 40 mg  40 mg Oral Daily Nat Christen, MD   40 mg at 10/02/16 0844  .  polyethylene glycol (MIRALAX / GLYCOLAX) packet 17 g  17 g Oral Daily PRN Rexene Alberts, MD      . potassium chloride SA (K-DUR,KLOR-CON) CR tablet 30 mEq  30 mEq Oral BID Rexene Alberts,  MD   30 mEq at 10/02/16 2317  . traMADol (ULTRAM) tablet 50 mg  50 mg Oral Q12H PRN Gardiner Barefoot, NP   50 mg at 10/02/16 2318  . traZODone (DESYREL) tablet 50 mg  50 mg Oral QHS Nat Christen, MD   50 mg at 10/02/16 2317  . umeclidinium bromide (INCRUSE ELLIPTA) 62.5 MCG/INH 1 puff  1 puff Inhalation Daily Nat Christen, MD   1 puff at 10/03/16 7915    Musculoskeletal: UTO, camera  Psychiatric Specialty Exam: Physical Exam  Review of Systems  Psychiatric/Behavioral: Positive for depression and hallucinations (auditory). Negative for suicidal ideas. The patient is nervous/anxious.   All other systems reviewed and are negative.   Blood pressure (!) 142/74, pulse (!) 50, temperature 97.7 F (36.5 C), temperature source Oral, resp. rate 20, height 5' 3"  (1.6 m), weight 67.5 kg (148 lb 13 oz), SpO2 98 %.Body mass index is 26.36 kg/m.  General Appearance: Casual and Fairly Groomed  Eye Contact:  Fair and worsening  Speech:  Clear and Coherent and Normal Rate  Volume:  Normal  Mood:  Anxious and agitated (worsening)  Affect:  Non-congruent  Thought Process:  Descriptions of Associations: Tangential  Orientation:  Self and place  Thought Content:  Fears of family members talking about her  Suicidal Thoughts:  No  Homicidal Thoughts:  No  Memory:  Immediate;   Fair Recent;   Fair Remote;   Fair  Judgement:  Fair  Insight:  Fair  Psychomotor Activity:  Normal  Concentration:  Concentration: Fair and Attention Span: Fair  Recall:  Poor  Fund of Knowledge:  Fair  Language:  Fair  Akathisia:  No  Handed:    AIMS (if indicated):     Assets:  Communication Skills Desire for Improvement Resilience Social Support Talents/Skills  ADL's:  Intact  Cognition:  Impaired,  Moderate  Sleep:       Treatment Plan Summary: Delirium with underlying dementia, worsening, treated as below:  Medications: As per our telephone discussion (Dr. Luan Pulling): -Taper down Xanax (0.58m prn)  -Restart Risperidone 0.258mpo bid for agitation and stabilization of behavior -Reduce Depakote to home dose of 12514mid  Disposition:  -Geropsych placement  WitBenjamine MolaNP 10/03/2016 10:15 AM

## 2016-10-03 NOTE — Discharge Summary (Addendum)
Physician Discharge Summary  Patient ID: [name] [name] MRN: [id number] DOB/AGE: 09/01/1935 81 y.o. Primary Care Physician:Shaylee Stanislawski L, MD Admit date: 09/30/2016 Discharge date: 10/03/2016    Discharge Diagnoses:   Principal Problem:   Acute encephalopathy Active Problems:   Essential hypertension   Hypokalemia   AAA (abdominal aortic aneurysm) without rupture ([facility])   UTI (urinary tract infection)   Chronic kidney disease (CKD) stage G3a/A1, moderately decreased glomerular filtration rate (GFR) between 45-59 mL/min/1.73 square meter and albuminuria creatinine ratio less than 30 mg/g   Dementia   Delirium   Visual hallucinations   Chronic anxiety  metabolic enhalopathy    Medication List    TAKE these medications   acetaminophen 500 MG tablet Commonly known as:  TYLENOL Take 500 mg by mouth every 6 (six) hours as needed for mild pain.   ACIDOPHILUS EXTRA STRENGTH PO Take 1 tablet by mouth 2 (two) times daily.   ALPRAZolam 0.25 MG tablet Commonly known as:  XANAX Take 0.25 mg by mouth 4 (four) times daily.   cefUROXime 250 MG tablet Commonly known as:  CEFTIN Take 1 tablet (250 mg total) by mouth 2 (two) times daily with a meal.   divalproex 125 MG DR tablet Commonly known as:  DEPAKOTE Take 125 mg by mouth 2 (two) times daily.   furosemide 40 MG tablet Commonly known as:  LASIX Take 40 mg by mouth every morning.   INCRUSE ELLIPTA 62.5 MCG/INH Aepb Generic drug:  umeclidinium bromide   nebivolol 5 MG tablet Commonly known as:  BYSTOLIC Take 5 mg by mouth daily.   omeprazole 20 MG capsule Commonly known as:  PRILOSEC Take 20 mg by mouth 2 (two) times daily before a meal.   traMADol 50 MG tablet Commonly known as:  ULTRAM Take 50 mg by mouth 2 (two) times daily.   traZODone 50 MG tablet Commonly known as:  DESYREL Take 1 tablet (50 mg total) by mouth at bedtime as needed for sleep (insomnia). What changed:  when to take this       Discharged  Condition: improved    Consults: psychiatry  Significant Diagnostic Studies: Ct Head Wo Contrast  Result Date: 10/01/2016 CLINICAL DATA:  Hallucinations EXAM: CT HEAD WITHOUT CONTRAST TECHNIQUE: Contiguous axial images were obtained from the base of the skull through the vertex without intravenous contrast. COMPARISON:  None. FINDINGS: Brain parenchyma, ventricular system, and extra-axial space are within normal limits. No mass effect, midline shift, or acute hemorrhage. Cranium is intact. Global atrophy and chronic ischemic changes are stable in the periventricular white matter. Sella turcica is prominent and fluid-filled. IMPRESSION: No acute intracranial pathology. Electronically Signed   By: Marybelle Killings M.D.   On: 10/01/2016 15:53    Lab Results: Basic Metabolic Panel:  Recent Labs  10/01/16 1512 10/02/16 0604 10/03/16 0612  NA  --  141 139  K  --  4.8 4.6  CL  --  111 108  CO2  --  26 25  GLUCOSE  --  90 84  BUN  --  20 18  CREATININE  --  1.15* 1.20*  CALCIUM  --  8.9 9.0  MG 2.1  --   --    Liver Function Tests:  Recent Labs  10/01/16 1515  AST 16  ALT 11*  ALKPHOS 104  BILITOT 0.5  PROT 6.8  ALBUMIN 3.4*     CBC:  Recent Labs  10/01/16 0004 10/02/16 0604  WBC 9.2 7.7  NEUTROABS 4.9  --  HGB 13.3 12.3  HCT 41.1 39.5  MCV 89.3 90.8  PLT 160 150    Recent Results (from the past 240 hour(s))  Urine culture     Status: Abnormal   Collection Time: 10/01/16  2:01 AM  Result Value Ref Range Status   Specimen Description URINE, CLEAN CATCH  Final   Special Requests NONE  Final   Culture MULTIPLE SPECIES PRESENT, SUGGEST RECOLLECTION (A)  Final   Report Status 10/02/2016 FINAL  Final  MRSA PCR Screening     Status: None   Collection Time: 10/02/16  5:31 PM  Result Value Ref Range Status   MRSA by PCR NEGATIVE NEGATIVE Final    Comment:        The GeneXpert MRSA Assay (FDA approved for NASAL specimens only), is one component of a comprehensive  MRSA colonization surveillance program. It is not intended to diagnose MRSA infection nor to guide or monitor treatment for MRSA infections.      Hospital Course:  She was admitted with what appeared to be acute encephalopathy. This was thought to be due to urinary tract infection but her urine grew multiple organisms so it's not totally clear. She initially got better then became very confused. Psychiatry consultation was obtained and their recommendations were followed. She became confused and combative despite treatment and required 1 dose of Haldol. By the time of discharge she is back at baseline. She is confused but not combative or agitated. She will be reassessed by psychiatry. At one point there was a thought that she might need to go to a combined psychiatric and medical unit. If she does not need that level of care she will go back to her skilled care facility  Discharge Exam: Blood pressure (!) 142/74, pulse (!) 50, temperature 97.7 F (36.5 C), temperature source Oral, resp. rate 20, height 5\' 3"  (1.6 m), weight 67.5 kg (148 lb 13 oz), SpO2 96 %. Awake and alert. Much less confused. Chest is clear.  Disposition: She will be discharged. The venue of her discharge will be determined. The dose of Xanax listed above is incorrect. It should be 0.25 mg by mouth 3 times a day when necessary anxiety. Her cefuroxime will be for 5 more days to be sure we've covered potential urinary tract infection. She will be on Risperdal 0.25 mg twice a day scheduled. This is also not on the medication list above  Discharge Instructions    Discharge to SNF when bed available    Complete by:  As directed         Signed: Curtistine Pettitt L   10/03/2016, 8:31 AM

## 2016-10-04 LAB — BASIC METABOLIC PANEL
Anion gap: 6 (ref 5–15)
BUN: 16 mg/dL (ref 6–20)
CHLORIDE: 107 mmol/L (ref 101–111)
CO2: 26 mmol/L (ref 22–32)
Calcium: 8.8 mg/dL — ABNORMAL LOW (ref 8.9–10.3)
Creatinine, Ser: 1.1 mg/dL — ABNORMAL HIGH (ref 0.44–1.00)
GFR calc non Af Amer: 46 mL/min — ABNORMAL LOW (ref >60)
GFR, EST AFRICAN AMERICAN: 54 mL/min — AB (ref >60)
Glucose, Bld: 89 mg/dL (ref 65–99)
POTASSIUM: 4.5 mmol/L (ref 3.5–5.1)
SODIUM: 139 mmol/L (ref 135–145)

## 2016-10-04 MED ORDER — HALOPERIDOL LACTATE 5 MG/ML IJ SOLN
5.0000 mg | Freq: Four times a day (QID) | INTRAMUSCULAR | Status: DC | PRN
  Administered 2016-10-04: 5 mg via INTRAVENOUS
  Filled 2016-10-04: qty 1

## 2016-10-04 NOTE — Progress Notes (Signed)
Subjective: I had intended to discharge her yesterday but she became much more agitated. She's having more visual and auditory hallucinations. She is taking her medication. She had repeat tele- psychiatry consultation and I have spoken to the consultant and adjusted medications accordingly. She is still very agitated. She has no complaints of chest pain, shortness of breath nausea or vomiting. No abdominal pain. She does not complain of dysuria now. Objective: Vital signs in last 24 hours: Temp:  [97.7 F (36.5 C)-97.9 F (36.6 C)] 97.7 F (36.5 C) (10/14 0442) Pulse Rate:  [56-67] 56 (10/14 0442) Resp:  [16-20] 16 (10/14 0442) BP: (108-130)/(68-77) 130/68 (10/14 0442) SpO2:  [96 %-97 %] 97 % (10/14 0442) Weight:  [67.4 kg (148 lb 9.4 oz)] 67.4 kg (148 lb 9.4 oz) (10/14 0442) Weight change: -0.1 kg (-3.5 oz) Last BM Date: 10/02/16  Intake/Output from previous day: 10/13 0701 - 10/14 0700 In: 1988.3 [P.O.:720; I.V.:1268.3] Out: -   PHYSICAL EXAM General appearance: alert and Very agitated and angry complains that the nursing staff have murdered 6 babies. Resp: clear to auscultation bilaterally Cardio: regular rate and rhythm, S1, S2 normal, no murmur, click, rub or gallop GI: soft, non-tender; bowel sounds normal; no masses,  no organomegaly Extremities: extremities normal, atraumatic, no cyanosis or edema  Lab Results:  Results for orders placed or performed during the hospital encounter of 09/30/16 (from the past 48 hour(s))  MRSA PCR Screening     Status: None   Collection Time: 10/02/16  5:31 PM  Result Value Ref Range   MRSA by PCR NEGATIVE NEGATIVE    Comment:        The GeneXpert MRSA Assay (FDA approved for NASAL specimens only), is one component of a comprehensive MRSA colonization surveillance program. It is not intended to diagnose MRSA infection nor to guide or monitor treatment for MRSA infections.   Basic metabolic panel     Status: Abnormal   Collection  Time: 10/03/16  6:12 AM  Result Value Ref Range   Sodium 139 135 - 145 mmol/L   Potassium 4.6 3.5 - 5.1 mmol/L   Chloride 108 101 - 111 mmol/L   CO2 25 22 - 32 mmol/L   Glucose, Bld 84 65 - 99 mg/dL   BUN 18 6 - 20 mg/dL   Creatinine, Ser 1.20 (H) 0.44 - 1.00 mg/dL   Calcium 9.0 8.9 - 10.3 mg/dL   GFR calc non Af Amer 42 (L) >60 mL/min   GFR calc Af Amer 48 (L) >60 mL/min    Comment: (NOTE) The eGFR has been calculated using the CKD EPI equation. This calculation has not been validated in all clinical situations. eGFR's persistently <60 mL/min signify possible Chronic Kidney Disease.    Anion gap 6 5 - 15  Basic metabolic panel     Status: Abnormal   Collection Time: 10/04/16  6:29 AM  Result Value Ref Range   Sodium 139 135 - 145 mmol/L   Potassium 4.5 3.5 - 5.1 mmol/L   Chloride 107 101 - 111 mmol/L   CO2 26 22 - 32 mmol/L   Glucose, Bld 89 65 - 99 mg/dL   BUN 16 6 - 20 mg/dL   Creatinine, Ser 1.10 (H) 0.44 - 1.00 mg/dL   Calcium 8.8 (L) 8.9 - 10.3 mg/dL   GFR calc non Af Amer 46 (L) >60 mL/min   GFR calc Af Amer 54 (L) >60 mL/min    Comment: (NOTE) The eGFR has been calculated using the  CKD EPI equation. This calculation has not been validated in all clinical situations. eGFR's persistently <60 mL/min signify possible Chronic Kidney Disease.    Anion gap 6 5 - 15    ABGS No results for input(s): PHART, PO2ART, TCO2, HCO3 in the last 72 hours.  Invalid input(s): PCO2 CULTURES Recent Results (from the past 240 hour(s))  Urine culture     Status: Abnormal   Collection Time: 10/01/16  2:01 AM  Result Value Ref Range Status   Specimen Description URINE, CLEAN CATCH  Final   Special Requests NONE  Final   Culture MULTIPLE SPECIES PRESENT, SUGGEST RECOLLECTION (A)  Final   Report Status 10/02/2016 FINAL  Final  MRSA PCR Screening     Status: None   Collection Time: 10/02/16  5:31 PM  Result Value Ref Range Status   MRSA by PCR NEGATIVE NEGATIVE Final    Comment:         The GeneXpert MRSA Assay (FDA approved for NASAL specimens only), is one component of a comprehensive MRSA colonization surveillance program. It is not intended to diagnose MRSA infection nor to guide or monitor treatment for MRSA infections.    Studies/Results: No results found.  Medications:  Prior to Admission:  Prescriptions Prior to Admission  Medication Sig Dispense Refill Last Dose  . acetaminophen (TYLENOL) 500 MG tablet Take 500 mg by mouth every 6 (six) hours as needed for mild pain.   unknown at Unknown time  . ALPRAZolam (XANAX) 0.25 MG tablet Take 0.25 mg by mouth 4 (four) times daily.    09/30/2016 at Unknown time  . divalproex (DEPAKOTE) 125 MG DR tablet Take 125 mg by mouth 2 (two) times daily.   09/30/2016 at Unknown time  . furosemide (LASIX) 40 MG tablet Take 40 mg by mouth every morning.   09/30/2016 at Unknown time  . INCRUSE ELLIPTA 62.5 MCG/INH AEPB    09/30/2016 at Unknown time  . Lactobacillus (ACIDOPHILUS EXTRA STRENGTH PO) Take 1 tablet by mouth 2 (two) times daily.   09/30/2016 at Unknown time  . nebivolol (BYSTOLIC) 5 MG tablet Take 5 mg by mouth daily.   09/30/2016 at 0800  . omeprazole (PRILOSEC) 20 MG capsule Take 20 mg by mouth 2 (two) times daily before a meal.    09/30/2016 at Unknown time  . traMADol (ULTRAM) 50 MG tablet Take 50 mg by mouth 2 (two) times daily.   09/30/2016 at Unknown time  . traZODone (DESYREL) 50 MG tablet Take 1 tablet (50 mg total) by mouth at bedtime as needed for sleep (insomnia). (Patient taking differently: Take 50 mg by mouth at bedtime. ) 30 tablet 12 09/30/2016 at Unknown time   Scheduled: . acidophilus  1 capsule Oral BID  . cefTRIAXone (ROCEPHIN)  IV  1 g Intravenous Q24H  . divalproex  125 mg Oral BID  . enoxaparin (LOVENOX) injection  30 mg Subcutaneous Q24H  . furosemide  40 mg Oral q morning - 10a  . nebivolol  5 mg Oral Daily  . pantoprazole  40 mg Oral Daily  . potassium chloride SA  30 mEq Oral BID   . risperiDONE  0.25 mg Oral BID  . traZODone  50 mg Oral QHS  . umeclidinium bromide  1 puff Inhalation Daily   Continuous: . sodium chloride 50 mL/hr at 10/04/16 0019   IZT:[id number] **OR** acetaminophen, albuterol, ALPRAZolam, ondansetron **OR** ondansetron (ZOFRAN) IV, polyethylene glycol, traMADol  Assesment: She was admitted with acute encephalopathy possibly toxic metabolic encephalopathy related  to urinary tract infection. She did have dysuria. However her urine culture is growing multiple species so she may not in fact have a urinary tract infection. She was dehydrated on admission hypokalemic on admission and both of those are better. Her renal function is back to baseline. Potassium is 4.5.  She continues to have visual hallucinations and she does have some element of dementia at baseline. She is on low-dose risperidone but it hasn't made much difference yet  She has hypertension which is pretty well controlled  She has acute on chronic kidney disease related to her dehydration.  She has an abdominal aortic aneurysm is not felt to be a good surgical candidate because of all of her medical problems.    Principal Problem:   Delirium Active Problems:   Acute encephalopathy   Essential hypertension   Hypokalemia   AAA (abdominal aortic aneurysm) without rupture (HCC)   UTI (urinary tract infection)   Chronic kidney disease (CKD) stage G3a/A1, moderately decreased glomerular filtration rate (GFR) between 45-59 mL/min/1.73 square meter and albuminuria creatinine ratio less than 30 mg/g   Dementia with behavioral disturbance   Visual hallucinations   Chronic anxiety    Plan:Continue current treatments. Safety sitter because she's been trying to get out of bed. Discontinue telemetry because that seems to agitate her   LOS: 3 days   Shivali Quackenbush L 10/04/2016, 9:48 AM

## 2016-10-05 LAB — BASIC METABOLIC PANEL
ANION GAP: 6 (ref 5–15)
BUN: 17 mg/dL (ref 6–20)
CHLORIDE: 110 mmol/L (ref 101–111)
CO2: 24 mmol/L (ref 22–32)
Calcium: 9 mg/dL (ref 8.9–10.3)
Creatinine, Ser: 1.07 mg/dL — ABNORMAL HIGH (ref 0.44–1.00)
GFR calc Af Amer: 56 mL/min — ABNORMAL LOW (ref >60)
GFR, EST NON AFRICAN AMERICAN: 48 mL/min — AB (ref >60)
GLUCOSE: 92 mg/dL (ref 65–99)
POTASSIUM: 4.7 mmol/L (ref 3.5–5.1)
SODIUM: 140 mmol/L (ref 135–145)

## 2016-10-05 NOTE — Progress Notes (Signed)
  [name] [name]:[id number] DOB: Mar 02, 1935 DOA: 09/30/2016 PCP: Alonza Bogus, MD   Subjective: This lady was admitted with altered mental status. The etiology of this is not entirely clear. She apparently had visual and auditory hallucinations. She has been reviewed by psychiatry. It seems  this morning she is calmer than previously.           Physical Exam: Blood pressure (!) 139/59, pulse (!) 58, temperature 98 F (36.7 [name]), temperature source Oral, resp. rate 18, height 5\' 3"  (1.6 m), weight 67.4 kg (148 lb 9.4 oz), SpO2 97 %. She looks systemically well. She does not appear to be toxic or septic clinically. She does not appear to be significantly delirious today. She is alert. She knows that she is in [facility]. However, she does not know today all the year or the president. Heart sounds are present without murmurs. Lung fields are clear. There are no obvious focal neurological signs.   Investigations:  Recent Results (from the past 240 hour(s))  Urine culture     Status: Abnormal   Collection Time: 10/01/16  2:01 AM  Result Value Ref Range Status   Specimen Description URINE, CLEAN CATCH  Final   Special Requests NONE  Final   Culture MULTIPLE SPECIES PRESENT, SUGGEST RECOLLECTION (A)  Final   Report Status 10/02/2016 FINAL  Final  MRSA PCR Screening     Status: None   Collection Time: 10/02/16  5:31 PM  Result Value Ref Range Status   MRSA by PCR NEGATIVE NEGATIVE Final    Comment:        The GeneXpert MRSA Assay (FDA approved for NASAL specimens only), is one component of a comprehensive MRSA colonization surveillance program. It is not intended to diagnose MRSA infection nor to guide or monitor treatment for MRSA infections.      Basic Metabolic Panel:  Recent Labs  10/04/16 0629 10/05/16 0613  NA 139 140  K 4.5 4.7  CL 107 110  CO2 26 24  GLUCOSE 89 92  BUN 16 17  CREATININE 1.10* 1.07*  CALCIUM 8.8* 9.0   Liver Function  Tests: No results for input(s): AST, ALT, ALKPHOS, BILITOT, PROT, ALBUMIN in the last 72 hours.   CBC: No results for input(s): WBC, NEUTROABS, HGB, HCT, MCV, PLT in the last 72 hours.  No results found.    Medications: I have reviewed the patient's current medications.  Impression:  Principal Problem:   Delirium Active Problems:   Acute encephalopathy   Essential hypertension   Hypokalemia   AAA (abdominal aortic aneurysm) without rupture (HCC)   UTI (urinary tract infection)   Chronic kidney disease (CKD) stage G3a/A1, moderately decreased glomerular filtration rate (GFR) between 45-59 mL/min/1.73 square meter and albuminuria creatinine ratio less than 30 mg/g   Dementia with behavioral disturbance   Visual hallucinations   Chronic anxiety     Plan: 1. Continue with current therapy. I suspect that she is clinically improving.  Consultants:  Psychiatry.   Procedures:  None.   Antibiotics:  Rocephin.                   Code Status: Full code.  Family Communication: No family members present.   Disposition Plan: Home when medically stable.  Time spent: 15 minutes.   LOS: 4 days   [name] [name]   10/05/2016, 8:32 AM

## 2016-10-06 DIAGNOSIS — N189 Chronic kidney disease, unspecified: Secondary | ICD-10-CM | POA: Diagnosis not present

## 2016-10-06 DIAGNOSIS — K219 Gastro-esophageal reflux disease without esophagitis: Secondary | ICD-10-CM | POA: Diagnosis not present

## 2016-10-06 DIAGNOSIS — R278 Other lack of coordination: Secondary | ICD-10-CM | POA: Diagnosis not present

## 2016-10-06 DIAGNOSIS — G9340 Encephalopathy, unspecified: Secondary | ICD-10-CM | POA: Diagnosis not present

## 2016-10-06 DIAGNOSIS — Z23 Encounter for immunization: Secondary | ICD-10-CM | POA: Diagnosis not present

## 2016-10-06 DIAGNOSIS — R279 Unspecified lack of coordination: Secondary | ICD-10-CM | POA: Diagnosis not present

## 2016-10-06 DIAGNOSIS — F339 Major depressive disorder, recurrent, unspecified: Secondary | ICD-10-CM | POA: Diagnosis not present

## 2016-10-06 DIAGNOSIS — G4700 Insomnia, unspecified: Secondary | ICD-10-CM | POA: Diagnosis not present

## 2016-10-06 DIAGNOSIS — F05 Delirium due to known physiological condition: Secondary | ICD-10-CM | POA: Diagnosis not present

## 2016-10-06 DIAGNOSIS — I714 Abdominal aortic aneurysm, without rupture: Secondary | ICD-10-CM | POA: Diagnosis not present

## 2016-10-06 DIAGNOSIS — E119 Type 2 diabetes mellitus without complications: Secondary | ICD-10-CM | POA: Diagnosis not present

## 2016-10-06 DIAGNOSIS — F0391 Unspecified dementia with behavioral disturbance: Secondary | ICD-10-CM | POA: Diagnosis not present

## 2016-10-06 DIAGNOSIS — J449 Chronic obstructive pulmonary disease, unspecified: Secondary | ICD-10-CM | POA: Diagnosis not present

## 2016-10-06 DIAGNOSIS — F419 Anxiety disorder, unspecified: Secondary | ICD-10-CM | POA: Diagnosis not present

## 2016-10-06 DIAGNOSIS — I10 Essential (primary) hypertension: Secondary | ICD-10-CM | POA: Diagnosis not present

## 2016-10-06 DIAGNOSIS — M6281 Muscle weakness (generalized): Secondary | ICD-10-CM | POA: Diagnosis not present

## 2016-10-06 DIAGNOSIS — L089 Local infection of the skin and subcutaneous tissue, unspecified: Secondary | ICD-10-CM | POA: Diagnosis not present

## 2016-10-06 DIAGNOSIS — G8929 Other chronic pain: Secondary | ICD-10-CM | POA: Diagnosis not present

## 2016-10-06 DIAGNOSIS — F0281 Dementia in other diseases classified elsewhere with behavioral disturbance: Secondary | ICD-10-CM | POA: Diagnosis not present

## 2016-10-06 DIAGNOSIS — E876 Hypokalemia: Secondary | ICD-10-CM | POA: Diagnosis not present

## 2016-10-06 DIAGNOSIS — F319 Bipolar disorder, unspecified: Secondary | ICD-10-CM | POA: Diagnosis not present

## 2016-10-06 DIAGNOSIS — N181 Chronic kidney disease, stage 1: Secondary | ICD-10-CM | POA: Diagnosis not present

## 2016-10-06 DIAGNOSIS — I2510 Atherosclerotic heart disease of native coronary artery without angina pectoris: Secondary | ICD-10-CM | POA: Diagnosis not present

## 2016-10-06 DIAGNOSIS — R1319 Other dysphagia: Secondary | ICD-10-CM | POA: Diagnosis not present

## 2016-10-06 DIAGNOSIS — Z7401 Bed confinement status: Secondary | ICD-10-CM | POA: Diagnosis not present

## 2016-10-06 DIAGNOSIS — G9341 Metabolic encephalopathy: Secondary | ICD-10-CM | POA: Diagnosis not present

## 2016-10-06 DIAGNOSIS — R531 Weakness: Secondary | ICD-10-CM | POA: Diagnosis not present

## 2016-10-06 DIAGNOSIS — N390 Urinary tract infection, site not specified: Secondary | ICD-10-CM | POA: Diagnosis not present

## 2016-10-06 DIAGNOSIS — R441 Visual hallucinations: Secondary | ICD-10-CM | POA: Diagnosis not present

## 2016-10-06 DIAGNOSIS — I259 Chronic ischemic heart disease, unspecified: Secondary | ICD-10-CM | POA: Diagnosis not present

## 2016-10-06 DIAGNOSIS — R41841 Cognitive communication deficit: Secondary | ICD-10-CM | POA: Diagnosis not present

## 2016-10-06 DIAGNOSIS — I129 Hypertensive chronic kidney disease with stage 1 through stage 4 chronic kidney disease, or unspecified chronic kidney disease: Secondary | ICD-10-CM | POA: Diagnosis not present

## 2016-10-06 DIAGNOSIS — E785 Hyperlipidemia, unspecified: Secondary | ICD-10-CM | POA: Diagnosis not present

## 2016-10-06 DIAGNOSIS — F5105 Insomnia due to other mental disorder: Secondary | ICD-10-CM | POA: Diagnosis not present

## 2016-10-06 DIAGNOSIS — Z79899 Other long term (current) drug therapy: Secondary | ICD-10-CM | POA: Diagnosis not present

## 2016-10-06 LAB — COMPREHENSIVE METABOLIC PANEL
ALBUMIN: 3.1 g/dL — AB (ref 3.5–5.0)
ALK PHOS: 92 U/L (ref 38–126)
ALT: 9 U/L — ABNORMAL LOW (ref 14–54)
ANION GAP: 5 (ref 5–15)
AST: 12 U/L — ABNORMAL LOW (ref 15–41)
BUN: 17 mg/dL (ref 6–20)
CALCIUM: 8.8 mg/dL — AB (ref 8.9–10.3)
CHLORIDE: 108 mmol/L (ref 101–111)
CO2: 28 mmol/L (ref 22–32)
Creatinine, Ser: 1.16 mg/dL — ABNORMAL HIGH (ref 0.44–1.00)
GFR calc Af Amer: 51 mL/min — ABNORMAL LOW (ref >60)
GFR calc non Af Amer: 44 mL/min — ABNORMAL LOW (ref >60)
GLUCOSE: 85 mg/dL (ref 65–99)
POTASSIUM: 4.5 mmol/L (ref 3.5–5.1)
SODIUM: 141 mmol/L (ref 135–145)
Total Bilirubin: 0.4 mg/dL (ref 0.3–1.2)
Total Protein: 6.4 g/dL — ABNORMAL LOW (ref 6.5–8.1)

## 2016-10-06 MED ORDER — RISPERIDONE 0.25 MG PO TABS: 0.2500 mg | ORAL_TABLET | Freq: Two times a day (BID) | ORAL | Status: AC

## 2016-10-06 NOTE — Clinical Social Work Note (Signed)
Pt d/c today back to [facility]. Re-evaluated and psychiatrically cleared today. Larene Beach at [facility] aware and agreeable. CSW left voicemail for pt's daughter, [name] regarding d/c today. Will transport via [employer].  Benay Pike, [place]

## 2016-10-06 NOTE — NC FL2 (Signed)
[place] LEVEL OF CARE SCREENING TOOL     IDENTIFICATION  Patient Name: [name] [name] Birthdate: 1935/08/25 Sex: female Admission Date (Current Location): 09/30/2016  [place] and [place] Number:  [place] [id number] [place] and Address:  [facility] [address] [place]      Provider Number: [id number]  Attending Physician Name and Address:  Sinda Du, MD  Relative Name and Phone Number:       Current Level of Care: Hospital Recommended Level of Care: Nursing Facility Prior Approval Number:    Date Approved/Denied:   PASRR Number: [id number]  Discharge Plan: SNF    Current Diagnoses: Patient Active Problem List   Diagnosis Date Noted  . Dementia with behavioral disturbance 10/01/2016  . Delirium 10/01/2016  . Visual hallucinations 10/01/2016  . Chronic anxiety 10/01/2016  . Abdominal aneurysm without mention of rupture 06/01/2014  . Abdominal pain, left lower quadrant 06/01/2014  . Failure to thrive 02/02/2014  . Generalized weakness 02/02/2014  . Gait difficulty 02/02/2014  . Confusion 02/02/2014  . Low back pain 02/02/2014  . Chest pressure 08/10/2013  . Abdominal pressure 08/10/2013  . Appendicitis, acute 05/28/2013  . Bradycardia 05/28/2013  . Chronic kidney disease (CKD) stage G3a/A1, moderately decreased glomerular filtration rate (GFR) between 45-59 mL/min/1.73 square meter and albuminuria creatinine ratio less than 30 mg/g 05/25/2013  . Abdominal pain 05/20/2013  . Nausea and vomiting 05/20/2013  . UTI (urinary tract infection) 05/20/2013  . Renal insufficiency 05/20/2013  . AAA (abdominal aortic aneurysm) without rupture ([place]) 02/09/2013  . RUQ abdominal pain 12/28/2012  . Failure to thrive in adult 12/28/2012  . Dehydration 12/28/2012  . Hypokalemia 12/28/2012  . Small bowel obstruction 09/02/2012  . Anemia 09/01/2012  . Peripheral vascular disease ([place]) 09/01/2012  . Essential hypertension    . COPD (chronic obstructive pulmonary disease) ([place])   . Atrial arrhythmia   . AAA (abdominal aortic aneurysm) ([place])   . Arteriosclerotic cardiovascular disease (ASCVD)   . Tobacco abuse, in remission   . Pituitary microadenoma ([place])   . Acute encephalopathy 08/14/2012  . Pulmonary embolism ([place]) 08/14/2012  . Hyperlipidemia 05/24/2012  . ARTHRITIS, HIPS, BILATERAL 06/06/2010    Orientation RESPIRATION BLADDER Height & Weight     Self  Normal Incontinent Weight: 149 lb 11.1 oz (67.9 kg) Height:  5\' 3"  (160 cm)  BEHAVIORAL SYMPTOMS/MOOD NEUROLOGICAL BOWEL NUTRITION STATUS  Other (Comment) (Pt initially agitated, yelling out. Now calm.)  (n/a) Incontinent Diet (Heart healthy)  AMBULATORY STATUS COMMUNICATION OF NEEDS Skin   Extensive Assist Verbally Bruising                       Personal Care Assistance Level of Assistance  Bathing, Feeding, Dressing Bathing Assistance: Maximum assistance Feeding assistance: Limited assistance Dressing Assistance: Maximum assistance     Functional Limitations Info  Sight, Hearing, Speech Sight Info: Adequate Hearing Info: Adequate Speech Info: Adequate    SPECIAL CARE FACTORS FREQUENCY                       Contractures      Additional Factors Info  Psychotropic Code Status Info: Full code Allergies Info: Zoloft (Sertraline Hcl), Asa (Aspirin), Lidocaine, Sulfonamide Derivatives, Ativan (Lorazepam)  Psychotropic Info: Depakote, Trazodone        Current Medications (10/06/2016):  This is the current hospital active medication list Current Facility-Administered Medications  Medication Dose Route Frequency Provider Last Rate Last Dose  .  0.9 %  sodium chloride infusion   Intravenous Continuous Sinda Du, MD 50 mL/hr at 10/05/16 1526    . acetaminophen (TYLENOL) tablet 650 mg  650 mg Oral Q6H PRN Rexene Alberts, MD   650 mg at 10/04/16 1809   Or  . acetaminophen (TYLENOL) suppository 650 mg  650 mg Rectal Q6H PRN  Rexene Alberts, MD      . acidophilus (RISAQUAD) capsule 1 capsule  1 capsule Oral BID Rexene Alberts, MD   1 capsule at 10/06/16 1238  . albuterol (PROVENTIL) (2.5 MG/3ML) 0.083% nebulizer solution 2.5 mg  2.5 mg Nebulization Q2H PRN Rexene Alberts, MD      . ALPRAZolam Duanne Moron) tablet 0.25 mg  0.25 mg Oral TID PRN Sinda Du, MD   0.25 mg at 10/05/16 2207  . cefTRIAXone (ROCEPHIN) 1 g in dextrose 5 % 50 mL IVPB  1 g Intravenous Q24H Rexene Alberts, MD 100 mL/hr at 10/05/16 1526 1 g at 10/05/16 1526  . divalproex (DEPAKOTE) DR tablet 125 mg  125 mg Oral BID Sinda Du, MD   125 mg at 10/06/16 1242  . enoxaparin (LOVENOX) injection 30 mg  30 mg Subcutaneous Q24H Rexene Alberts, MD   30 mg at 10/05/16 1526  . furosemide (LASIX) tablet 40 mg  40 mg Oral q morning - 10a Nat Christen, MD   40 mg at 10/06/16 1239  . haloperidol lactate (HALDOL) injection 5 mg  5 mg Intravenous Q6H PRN Sinda Du, MD   5 mg at 10/04/16 1114  . nebivolol (BYSTOLIC) tablet 5 mg  5 mg Oral Daily Nat Christen, MD   5 mg at 10/06/16 1242  . ondansetron (ZOFRAN) tablet 4 mg  4 mg Oral Q6H PRN Rexene Alberts, MD       Or  . ondansetron [facility]) injection 4 mg  4 mg Intravenous Q6H PRN Rexene Alberts, MD      . pantoprazole (PROTONIX) EC tablet 40 mg  40 mg Oral Daily Nat Christen, MD   40 mg at 10/06/16 1240  . polyethylene glycol (MIRALAX / GLYCOLAX) packet 17 g  17 g Oral Daily PRN Rexene Alberts, MD      . potassium chloride SA (K-DUR,KLOR-CON) CR tablet 30 mEq  30 mEq Oral BID Rexene Alberts, MD   30 mEq at 10/06/16 1238  . risperiDONE (RISPERDAL) tablet 0.25 mg  0.25 mg Oral BID Sinda Du, MD   0.25 mg at 10/06/16 1239  . traMADol (ULTRAM) tablet 50 mg  50 mg Oral Q12H PRN Gardiner Barefoot, NP   50 mg at 10/03/16 2042  . traZODone (DESYREL) tablet 50 mg  50 mg Oral QHS Nat Christen, MD   50 mg at 10/05/16 2207  . umeclidinium bromide (INCRUSE ELLIPTA) 62.5 MCG/INH 1 puff  1 puff Inhalation Daily Nat Christen, MD   1 puff  at 10/05/16 0741     Discharge Medications: Please see discharge summary for a list of discharge medications.  Relevant Imaging Results:  Relevant Lab Results:   Additional Information Psychiatrically cleared 10/06/16.  Benay Pike [place], [place]

## 2016-10-06 NOTE — Progress Notes (Signed)
Patient discharged back to [facility]. Report called,and given to Ivonne Andrew RN. Vital signs stable.Transported via [facility] [place].

## 2016-10-06 NOTE — Progress Notes (Signed)
Subjective: She has done much better for the last 48 hours. She received 1 dose as best I can tell of IV Haldol and that was about 36 hours ago. She has been calm but confused. No other new complaints. He is with her history may not be reliable but she does not complain of chest pain nausea vomiting abdominal pain or shortness of breath. She is alert  Objective: Vital signs in last 24 hours: Temp:  [97.7 F (36.5 C)-98.4 F (36.9 C)] 97.7 F (36.5 C) (10/16 0631) Pulse Rate:  [55-61] 61 (10/16 0631) Resp:  [18-20] 20 (10/16 0631) BP: (117-158)/(71-80) 140/72 (10/16 0631) SpO2:  [94 %-97 %] 94 % (10/16 0631) Weight:  [67.9 kg (149 lb 11.1 oz)] 67.9 kg (149 lb 11.1 oz) (10/16 0631) Weight change: 0.5 kg (1 lb 1.6 oz) Last BM Date: 10/05/16  Intake/Output from previous day: 10/15 0701 - 10/16 0700 In: 600 [P.O.:600] Out: -   PHYSICAL EXAM General appearance: alert, mild distress and Confused Resp: clear to auscultation bilaterally Cardio: regular rate and rhythm, S1, S2 normal, no murmur, click, rub or gallop GI: soft, non-tender; bowel sounds normal; no masses,  no organomegaly Extremities: extremities normal, atraumatic, no cyanosis or edema Her mucous membranes are moist. Pupils are reactive  Lab Results:  Results for orders placed or performed during the hospital encounter of 09/30/16 (from the past 48 hour(s))  Basic metabolic panel     Status: Abnormal   Collection Time: 10/05/16  6:13 AM  Result Value Ref Range   Sodium 140 135 - 145 mmol/L   Potassium 4.7 3.5 - 5.1 mmol/L   Chloride 110 101 - 111 mmol/L   CO2 24 22 - 32 mmol/L   Glucose, Bld 92 65 - 99 mg/dL   BUN 17 6 - 20 mg/dL   Creatinine, Ser 1.07 (H) 0.44 - 1.00 mg/dL   Calcium 9.0 8.9 - 10.3 mg/dL   GFR calc non Af Amer 48 (L) >60 mL/min   GFR calc Af Amer 56 (L) >60 mL/min    Comment: (NOTE) The eGFR has been calculated using the CKD EPI equation. This calculation has not been validated in all clinical  situations. eGFR's persistently <60 mL/min signify possible Chronic Kidney Disease.    Anion gap 6 5 - 15  Comprehensive metabolic panel     Status: Abnormal   Collection Time: 10/06/16  6:09 AM  Result Value Ref Range   Sodium 141 135 - 145 mmol/L   Potassium 4.5 3.5 - 5.1 mmol/L   Chloride 108 101 - 111 mmol/L   CO2 28 22 - 32 mmol/L   Glucose, Bld 85 65 - 99 mg/dL   BUN 17 6 - 20 mg/dL   Creatinine, Ser 1.16 (H) 0.44 - 1.00 mg/dL   Calcium 8.8 (L) 8.9 - 10.3 mg/dL   Total Protein 6.4 (L) 6.5 - 8.1 g/dL   Albumin 3.1 (L) 3.5 - 5.0 g/dL   AST 12 (L) 15 - 41 U/L   ALT 9 (L) 14 - 54 U/L   Alkaline Phosphatase 92 38 - 126 U/L   Total Bilirubin 0.4 0.3 - 1.2 mg/dL   GFR calc non Af Amer 44 (L) >60 mL/min   GFR calc Af Amer 51 (L) >60 mL/min    Comment: (NOTE) The eGFR has been calculated using the CKD EPI equation. This calculation has not been validated in all clinical situations. eGFR's persistently <60 mL/min signify possible Chronic Kidney Disease.    Anion gap  5 5 - 15    ABGS No results for input(s): PHART, PO2ART, TCO2, HCO3 in the last 72 hours.  Invalid input(s): PCO2 CULTURES Recent Results (from the past 240 hour(s))  Urine culture     Status: Abnormal   Collection Time: 10/01/16  2:01 AM  Result Value Ref Range Status   Specimen Description URINE, CLEAN CATCH  Final   Special Requests NONE  Final   Culture MULTIPLE SPECIES PRESENT, SUGGEST RECOLLECTION (A)  Final   Report Status 10/02/2016 FINAL  Final  MRSA PCR Screening     Status: None   Collection Time: 10/02/16  5:31 PM  Result Value Ref Range Status   MRSA by PCR NEGATIVE NEGATIVE Final    Comment:        The GeneXpert MRSA Assay (FDA approved for NASAL specimens only), is one component of a comprehensive MRSA colonization surveillance program. It is not intended to diagnose MRSA infection nor to guide or monitor treatment for MRSA infections.    Studies/Results: No results  found.  Medications:  Prior to Admission:  Prescriptions Prior to Admission  Medication Sig Dispense Refill Last Dose  . acetaminophen (TYLENOL) 500 MG tablet Take 500 mg by mouth every 6 (six) hours as needed for mild pain.   unknown at Unknown time  . ALPRAZolam (XANAX) 0.25 MG tablet Take 0.25 mg by mouth 4 (four) times daily.    09/30/2016 at Unknown time  . divalproex (DEPAKOTE) 125 MG DR tablet Take 125 mg by mouth 2 (two) times daily.   09/30/2016 at Unknown time  . furosemide (LASIX) 40 MG tablet Take 40 mg by mouth every morning.   09/30/2016 at Unknown time  . INCRUSE ELLIPTA 62.5 MCG/INH AEPB    09/30/2016 at Unknown time  . Lactobacillus (ACIDOPHILUS EXTRA STRENGTH PO) Take 1 tablet by mouth 2 (two) times daily.   09/30/2016 at Unknown time  . nebivolol (BYSTOLIC) 5 MG tablet Take 5 mg by mouth daily.   09/30/2016 at 0800  . omeprazole (PRILOSEC) 20 MG capsule Take 20 mg by mouth 2 (two) times daily before a meal.    09/30/2016 at Unknown time  . traMADol (ULTRAM) 50 MG tablet Take 50 mg by mouth 2 (two) times daily.   09/30/2016 at Unknown time  . traZODone (DESYREL) 50 MG tablet Take 1 tablet (50 mg total) by mouth at bedtime as needed for sleep (insomnia). (Patient taking differently: Take 50 mg by mouth at bedtime. ) 30 tablet 12 09/30/2016 at Unknown time   Scheduled: . acidophilus  1 capsule Oral BID  . cefTRIAXone (ROCEPHIN)  IV  1 g Intravenous Q24H  . divalproex  125 mg Oral BID  . enoxaparin (LOVENOX) injection  30 mg Subcutaneous Q24H  . furosemide  40 mg Oral q morning - 10a  . nebivolol  5 mg Oral Daily  . pantoprazole  40 mg Oral Daily  . potassium chloride SA  30 mEq Oral BID  . risperiDONE  0.25 mg Oral BID  . traZODone  50 mg Oral QHS  . umeclidinium bromide  1 puff Inhalation Daily   Continuous: . sodium chloride 50 mL/hr at 10/05/16 1526   VHQ:[id number] **OR** acetaminophen, albuterol, ALPRAZolam, haloperidol lactate, ondansetron **OR** ondansetron  (ZOFRAN) IV, polyethylene glycol, traMADol  Assesment: She was admitted with delirium. This may have been related to UTI but her urine culture grew multiple species so is not totally clear. She is much better. This was felt to be acute encephalopathy.  She has some mental illness at baseline that I don't think is well-defined  She has dementia at baseline.  She has hypertension which is well controlled. She has chronic kidney disease she was probably somewhat dehydrated on admission and had acute kidney injury and that is back to baseline.  She is going to be reassessed by psychiatry today but I think she is ready for discharge either to combined medical psychiatric unit if needed or potentially back to skilled care facility Principal Problem:   Delirium Active Problems:   Acute encephalopathy   Essential hypertension   Hypokalemia   AAA (abdominal aortic aneurysm) without rupture (HCC)   UTI (urinary tract infection)   Chronic kidney disease (CKD) stage G3a/A1, moderately decreased glomerular filtration rate (GFR) between 45-59 mL/min/1.73 square meter and albuminuria creatinine ratio less than 30 mg/g   Dementia with behavioral disturbance   Visual hallucinations   Chronic anxiety    Plan: As above    LOS: 5 days   , L 10/06/2016, 8:14 AM

## 2016-10-06 NOTE — Consult Note (Signed)
Telepsych Consultation   Reason for Consult:  Behavioral disturbance, history of dementia  Referring Physician:  EDP Patient Identification: [name] [name] MRN:  [id number] Principal Diagnosis: Delirium and exacerbation of underlying dementia  Diagnosis:   Patient Active Problem List   Diagnosis Date Noted  . Dementia with behavioral disturbance [F03.91] 10/01/2016  . Delirium [R41.0] 10/01/2016  . Visual hallucinations [R44.1] 10/01/2016  . Chronic anxiety [F41.9] 10/01/2016  . Abdominal aneurysm without mention of rupture [I71.4] 06/01/2014  . Abdominal pain, left lower quadrant [R10.32] 06/01/2014  . Failure to thrive [IMO0002] 02/02/2014  . Generalized weakness [R53.1] 02/02/2014  . Gait difficulty [R26.9] 02/02/2014  . Confusion [R41.0] 02/02/2014  . Low back pain [M54.5] 02/02/2014  . Chest pressure [R07.89] 08/10/2013  . Abdominal pressure [R10.9] 08/10/2013  . Appendicitis, acute [K35.80] 05/28/2013  . Bradycardia [R00.1] 05/28/2013  . Chronic kidney disease (CKD) stage G3a/A1, moderately decreased glomerular filtration rate (GFR) between 45-59 mL/min/1.73 square meter and albuminuria creatinine ratio less than 30 mg/g [N18.3] 05/25/2013  . Abdominal pain [R10.9] 05/20/2013  . Nausea and vomiting [R11.2] 05/20/2013  . UTI (urinary tract infection) [N39.0] 05/20/2013  . Renal insufficiency [N28.9] 05/20/2013  . AAA (abdominal aortic aneurysm) without rupture ([place]) [I71.4] 02/09/2013  . RUQ abdominal pain [R10.11] 12/28/2012  . Failure to thrive in adult [R62.7] 12/28/2012  . Dehydration [E86.0] 12/28/2012  . Hypokalemia [E87.6] 12/28/2012  . Small bowel obstruction [K56.609] 09/02/2012  . Anemia [D64.9] 09/01/2012  . Peripheral vascular disease ([place]) [I73.9] 09/01/2012  . Essential hypertension [I10]   . COPD (chronic obstructive pulmonary disease) ([place]) [J44.9]   . Atrial arrhythmia [I49.8]   . AAA (abdominal aortic aneurysm) ([place]) [I71.4]   . Arteriosclerotic  cardiovascular disease (ASCVD) [I25.10]   . Tobacco abuse, in remission [F17.201]   . Pituitary microadenoma ([place]) [D35.2]   . Acute encephalopathy [G93.40] 08/14/2012  . Pulmonary embolism ([place]) [I26.99] 08/14/2012  . Hyperlipidemia [E78.5] 05/24/2012  . ARTHRITIS, HIPS, BILATERAL [M16.10] 06/06/2010    Total Time spent with patient: 30 minutes   Subjective:   [name] [name] is a 81 y.o. female patient admitted with reports of a change in behavior in her nursing facility. Upon arrival at the ED, it was found that she also has a UTI. Pt seen and chart reviewed on 10/03/16 for follow up psychiatry consult. Pt is alert/oriented to self and place only, calm, cooperative, and appropriate to situation. Pt denies suicidal/homicidal ideation and does not appear to be responding to internal stimuli.   Pt seen again today 10/06/2016 and her symptoms appear to have stabilized. Her medications were adjusted by a colleague on 10/03/2016. Patient has been tolerating low dose risperdal for agitation and dementia related psychosis. Today patient expresses concern that "somebody might shoot me" but communicates this fear in a calm demeanor. Her paranoia appear to be related to her dementia. At this time the patient is not meeting criteria for gero-psych. She appears stable to return to her skilled care facility today. Patient states "I'm not sick anymore and not anxious. I want to go home. I don't hear voices and I would never hurt myself." Documentation from Dr. Luan Pulling indicates that the patient has improved over the last 48 hours and is not requiring IV haldol. Patient has been calm but remains confused, which based on her history is likely her baseline.    HPI:  I have reviewed and concur with HPI elements below, modified as follows: [name] [name] is an 81 y.o. widowed female who presents to [facility] ED under involuntary commitment taken out by staff at [facility]. Pt is accompanied by her  daughter/guardian Diamantina [facility] [phone]. Tonight Pt became aggressive with another resident and was yelling and screaming. [name] states that for the past 3-4 weeks Pt has said she saw people who were not there, believes her niece is living with her and killing babies and that she is still living with her son in a trailer and that he wants to kill her. Pt acknowledges she believes these things. Pt acknowledges symptoms including crying spells, fatigue, irritability and feeling nervous and lonely. She reports poor sleep and decreased eating. Pt denies current suicidal ideation, thoughts of dying or any history of suicide attempts. She denies thoughts of wanting to harm others and denies history of violence. She has no know history of substance abuse.  Past Psychiatric History: Dementia  Risk to Self: Suicidal Ideation: No Suicidal Intent: No Is patient at risk for suicide?: No Suicidal Plan?: No Access to Means: No What has been your use of drugs/alcohol within the last 12 months?: None How many times?: 0 Other Self Harm Risks: None Triggers for Past Attempts: None known Intentional Self Injurious Behavior: None Risk to Others: Homicidal Ideation: No Thoughts of Harm to Others: No Current Homicidal Intent: No Current Homicidal Plan: No Access to Homicidal Means: No Identified Victim: None History of harm to others?: No Assessment of Violence: On admission Violent Behavior Description: Pt aggressive with nursing home staff Does patient have access to weapons?: No Criminal Charges Pending?: No Does patient have a court date: No Prior Inpatient Therapy: Prior Inpatient Therapy: Yes Prior Therapy Dates: Unknown Prior Therapy Facilty/Provider(s): [facility], [place] Reason for Treatment: Dementia, mood disturbance Prior Outpatient Therapy: Prior Outpatient Therapy: Yes Prior Therapy Dates: Currently Prior Therapy Facilty/Provider(s): Dr. Luan Pulling Reason for  Treatment: Dementia, mood disturbance Does patient have an ACCT team?: No Does patient have Intensive In-House Services?  : No Does patient have [facility] services? : No Does patient have P4CC services?: No  Past Medical History:  Past Medical History:  Diagnosis Date  . AAA (abdominal aortic aneurysm) ([place])    infrarenal; 07/2012: Diameter of 5.4 cm  . AAA (abdominal aortic aneurysm) ([place])   . Anemia   . Arteriosclerotic cardiovascular disease (ASCVD)    Cardiac cath in 03/2000->  80% LAD, 70-80% RCA;  stent x1 in the LAD and x2 in the RCA with excellent angiographic outcome; repeat catheterization in late 2001 and 2003 revealed no restenosis; EF-50%  . Atrial arrhythmia    Atrial fibrillation and atrial flutter diagnosed in the past;?  PSVT  . Chronic kidney disease (CKD) stage G3a/A1, moderately decreased glomerular filtration rate (GFR) between 45-59 mL/min/1.73 square meter and albuminuria creatinine ratio less than 30 mg/g 05/25/2013  . COPD (chronic obstructive pulmonary disease) ([place])    multiple hospitalizations for exacerbations with acute bronchitis  . Coronary artery disease   . Degenerative joint disease    Bilateral hips  . Dementia   . Depression with anxiety   . DJD (degenerative joint disease)   . Encephalopathy   . Gall stones   . GERD (gastroesophageal reflux disease)    Hiatal hernia  . Hyperlipidemia   . Hyperlipidemia   . Hypernatremia   . Hypertension   . Hypokalemia   . Ileus ([place])   . Nephrolithiasis   . Pituitary microadenoma ([place])    transsphenoidal resection in 09/2007  . Pulmonary embolism ([place]) 08/14/2012  . PVD (  peripheral vascular disease) ([place])   . Renal failure   . SBO (small bowel obstruction)   . Tobacco abuse, in remission    Quit in 2006; total consumption of 50-75 pack years    Past Surgical History:  Procedure Laterality Date  . APPENDECTOMY N/A 05/25/2013   Procedure: APPENDECTOMY;  Surgeon: Donato Heinz, MD;  Location: [facility];   Service: General;  Laterality: N/A;  . CARDIAC CATHETERIZATION  2001   post PTCA and stenting of her LAD and RCA  . KIDNEY STONE SURGERY    . TRANSPHENOIDAL / TRANSNASAL HYPOPHYSECTOMY / RESECTION PITUITARY TUMOR  09/2007   Family History:  Family History  Problem Relation Age of Onset  . Heart disease Mother   . Hyperlipidemia Mother   . Hypertension Mother   . Stroke Mother   . Cancer Brother    Family Psychiatric  History: MDD Social History:  History  Alcohol Use No     History  Drug Use No    Social History   Social History  . Marital status: Widowed    Spouse name: N/A  . Number of children: N/A  . Years of education: N/A   Social History Main Topics  . Smoking status: Former Smoker    Packs/day: 1.50    Years: 50.00    Types: Cigarettes    Quit date: 05/27/2005  . Smokeless tobacco: Former Systems developer    Types: Snuff    Quit date: 01/11/2008  . Alcohol use No  . Drug use: No  . Sexual activity: Not Currently   Other Topics Concern  . None   Social History Narrative  . None   Additional Social History:    Allergies:   Allergies  Allergen Reactions  . Zoloft [Sertraline Hcl] Anxiety  . Asa [Aspirin] Other (See Comments)    "my ears bleed"  . Lidocaine Other (See Comments)    unknown  . Sulfonamide Derivatives Other (See Comments)    unknown  . Ativan [Lorazepam] Anxiety    Per daughter    Labs:  Results for orders placed or performed during the hospital encounter of 09/30/16 (from the past 48 hour(s))  Basic metabolic panel     Status: Abnormal   Collection Time: 10/05/16  6:13 AM  Result Value Ref Range   Sodium 140 135 - 145 mmol/L   Potassium 4.7 3.5 - 5.1 mmol/L   Chloride 110 101 - 111 mmol/L   CO2 24 22 - 32 mmol/L   Glucose, Bld 92 65 - 99 mg/dL   BUN 17 6 - 20 mg/dL   Creatinine, Ser 1.07 (H) 0.44 - 1.00 mg/dL   Calcium 9.0 8.9 - 10.3 mg/dL   GFR calc non Af Amer 48 (L) >60 mL/min   GFR calc Af Amer 56 (L) >60 mL/min    Comment:  (NOTE) The eGFR has been calculated using the CKD EPI equation. This calculation has not been validated in all clinical situations. eGFR's persistently <60 mL/min signify possible Chronic Kidney Disease.    Anion gap 6 5 - 15  Comprehensive metabolic panel     Status: Abnormal   Collection Time: 10/06/16  6:09 AM  Result Value Ref Range   Sodium 141 135 - 145 mmol/L   Potassium 4.5 3.5 - 5.1 mmol/L   Chloride 108 101 - 111 mmol/L   CO2 28 22 - 32 mmol/L   Glucose, Bld 85 65 - 99 mg/dL   BUN 17 6 - 20 mg/dL  Creatinine, Ser 1.16 (H) 0.44 - 1.00 mg/dL   Calcium 8.8 (L) 8.9 - 10.3 mg/dL   Total Protein 6.4 (L) 6.5 - 8.1 g/dL   Albumin 3.1 (L) 3.5 - 5.0 g/dL   AST 12 (L) 15 - 41 U/L   ALT 9 (L) 14 - 54 U/L   Alkaline Phosphatase 92 38 - 126 U/L   Total Bilirubin 0.4 0.3 - 1.2 mg/dL   GFR calc non Af Amer 44 (L) >60 mL/min   GFR calc Af Amer 51 (L) >60 mL/min    Comment: (NOTE) The eGFR has been calculated using the CKD EPI equation. This calculation has not been validated in all clinical situations. eGFR's persistently <60 mL/min signify possible Chronic Kidney Disease.    Anion gap 5 5 - 15    Current Facility-Administered Medications  Medication Dose Route Frequency Provider Last Rate Last Dose  . 0.9 %  sodium chloride infusion   Intravenous Continuous Sinda Du, MD 50 mL/hr at 10/05/16 1526    . acetaminophen (TYLENOL) tablet 650 mg  650 mg Oral Q6H PRN Rexene Alberts, MD   650 mg at 10/04/16 1809   Or  . acetaminophen (TYLENOL) suppository 650 mg  650 mg Rectal Q6H PRN Rexene Alberts, MD      . acidophilus (RISAQUAD) capsule 1 capsule  1 capsule Oral BID Rexene Alberts, MD   1 capsule at 10/05/16 2207  . albuterol (PROVENTIL) (2.5 MG/3ML) 0.083% nebulizer solution 2.5 mg  2.5 mg Nebulization Q2H PRN Rexene Alberts, MD      . ALPRAZolam Duanne Moron) tablet 0.25 mg  0.25 mg Oral TID PRN Sinda Du, MD   0.25 mg at 10/05/16 2207  . cefTRIAXone (ROCEPHIN) 1 g in dextrose 5 %  50 mL IVPB  1 g Intravenous Q24H Rexene Alberts, MD 100 mL/hr at 10/05/16 1526 1 g at 10/05/16 1526  . divalproex (DEPAKOTE) DR tablet 125 mg  125 mg Oral BID Sinda Du, MD   125 mg at 10/05/16 2208  . enoxaparin (LOVENOX) injection 30 mg  30 mg Subcutaneous Q24H Rexene Alberts, MD   30 mg at 10/05/16 1526  . furosemide (LASIX) tablet 40 mg  40 mg Oral q morning - 10a Nat Christen, MD   40 mg at 10/05/16 1003  . haloperidol lactate (HALDOL) injection 5 mg  5 mg Intravenous Q6H PRN Sinda Du, MD   5 mg at 10/04/16 1114  . nebivolol (BYSTOLIC) tablet 5 mg  5 mg Oral Daily Nat Christen, MD   5 mg at 10/05/16 1003  . ondansetron (ZOFRAN) tablet 4 mg  4 mg Oral Q6H PRN Rexene Alberts, MD       Or  . ondansetron [facility]) injection 4 mg  4 mg Intravenous Q6H PRN Rexene Alberts, MD      . pantoprazole (PROTONIX) EC tablet 40 mg  40 mg Oral Daily Nat Christen, MD   40 mg at 10/05/16 1003  . polyethylene glycol (MIRALAX / GLYCOLAX) packet 17 g  17 g Oral Daily PRN Rexene Alberts, MD      . potassium chloride SA (K-DUR,KLOR-CON) CR tablet 30 mEq  30 mEq Oral BID Rexene Alberts, MD   30 mEq at 10/05/16 2207  . risperiDONE (RISPERDAL) tablet 0.25 mg  0.25 mg Oral BID Sinda Du, MD   0.25 mg at 10/05/16 2206  . traMADol (ULTRAM) tablet 50 mg  50 mg Oral Q12H PRN Gardiner Barefoot, NP   50 mg at 10/03/16 2042  . traZODone (  DESYREL) tablet 50 mg  50 mg Oral QHS Nat Christen, MD   50 mg at 10/05/16 2207  . umeclidinium bromide (INCRUSE ELLIPTA) 62.5 MCG/INH 1 puff  1 puff Inhalation Daily Nat Christen, MD   1 puff at 10/05/16 0741    Musculoskeletal: UTO, camera  Psychiatric Specialty Exam: Physical Exam  Review of Systems  Psychiatric/Behavioral: Positive for depression and hallucinations (auditory). Negative for suicidal ideas. The patient is nervous/anxious.   All other systems reviewed and are negative.   Blood pressure 140/72, pulse 61, temperature 97.7 F (36.5 C), temperature source Axillary,  resp. rate 20, height 5' 3"  (1.6 m), weight 67.9 kg (149 lb 11.1 oz), SpO2 94 %.Body mass index is 26.52 kg/m.  General Appearance: Casual and Fairly Groomed  Eye Contact:  Fair   Speech:  Clear and Coherent and Normal Rate  Volume:  Normal  Mood:  Euthymic  Affect: Appropriate   Thought Process:  Descriptions of Associations: Tangential  Orientation:  Self and place  Thought Content:  Desire to return home  Suicidal Thoughts:  No  Homicidal Thoughts:  No  Memory:  Immediate;   Fair Recent;   Fair Remote;   Fair  Judgement:  Fair  Insight:  Fair  Psychomotor Activity:  Normal  Concentration:  Concentration: Fair and Attention Span: Fair  Recall:  Poor  Fund of Knowledge:  Fair  Language:  Fair  Akathisia:  No  Handed:    AIMS (if indicated):     Assets:  Communication Skills Desire for Improvement Resilience Social Support Talents/Skills  ADL's:  Intact  Cognition:  Impaired,  Moderate  Sleep:      Treatment Plan Summary: Delirium with underlying dementia, treated as below:  Medications:  -Continue low dose Xanax (0.27m prn) for anxiety -Continue Risperidone 0.281mpo bid for agitation and stabilization of behavior -Continue Depakote to home dose of 12532mid  Disposition:  -Stable to return to nursing facility today when medically cleared.   DAVElmarie ShileyP-C 10/06/2016 12:09 PM

## 2016-10-06 NOTE — Care Management Important Message (Signed)
Important Message  Patient Details  Name: [name] [name] MRN: [id number] Date of Birth: Jan 06, 1935   Medicare Important Message Given:  Yes    Sherald Barge, RN 10/06/2016, 12:51 PM

## 2016-10-06 NOTE — Care Management Note (Signed)
Case Management Note  Patient Details  Name: [name] [name] MRN: [id number] Date of Birth: [birth year]   Expected Discharge Date:  10/04/16               Expected Discharge Plan:  [place]  In-House Referral:  Clinical Social Work  Discharge planning Services  CM Consult  Post Acute Care Choice:  NA Choice offered to:  NA  DME Arranged:    DME Agency:     HH Arranged:    [place] Agency:     Status of Service:  Completed, signed off  If discussed at [employer], dates discussed:    Additional Comments: Pt discharging back to SNF today. CSW is aware of DC and will make arrangements to return to facility. No CM needs.   Sherald Barge, RN 10/06/2016, 12:36 PM

## 2016-10-13 DIAGNOSIS — F5105 Insomnia due to other mental disorder: Secondary | ICD-10-CM | POA: Diagnosis not present

## 2016-10-13 DIAGNOSIS — F0281 Dementia in other diseases classified elsewhere with behavioral disturbance: Secondary | ICD-10-CM | POA: Diagnosis not present

## 2016-10-13 DIAGNOSIS — F419 Anxiety disorder, unspecified: Secondary | ICD-10-CM | POA: Diagnosis not present

## 2016-10-15 DIAGNOSIS — R531 Weakness: Secondary | ICD-10-CM | POA: Diagnosis not present

## 2016-10-15 DIAGNOSIS — N181 Chronic kidney disease, stage 1: Secondary | ICD-10-CM | POA: Diagnosis not present

## 2016-10-15 DIAGNOSIS — J449 Chronic obstructive pulmonary disease, unspecified: Secondary | ICD-10-CM | POA: Diagnosis not present

## 2016-10-15 DIAGNOSIS — I10 Essential (primary) hypertension: Secondary | ICD-10-CM | POA: Diagnosis not present

## 2016-10-15 DIAGNOSIS — E119 Type 2 diabetes mellitus without complications: Secondary | ICD-10-CM | POA: Diagnosis not present

## 2016-10-27 DIAGNOSIS — F5105 Insomnia due to other mental disorder: Secondary | ICD-10-CM | POA: Diagnosis not present

## 2016-10-27 DIAGNOSIS — F0281 Dementia in other diseases classified elsewhere with behavioral disturbance: Secondary | ICD-10-CM | POA: Diagnosis not present

## 2016-10-27 DIAGNOSIS — F419 Anxiety disorder, unspecified: Secondary | ICD-10-CM | POA: Diagnosis not present

## 2016-11-05 DIAGNOSIS — I259 Chronic ischemic heart disease, unspecified: Secondary | ICD-10-CM | POA: Diagnosis not present

## 2016-11-05 DIAGNOSIS — R531 Weakness: Secondary | ICD-10-CM | POA: Diagnosis not present

## 2016-11-05 DIAGNOSIS — E119 Type 2 diabetes mellitus without complications: Secondary | ICD-10-CM | POA: Diagnosis not present

## 2016-11-05 DIAGNOSIS — I10 Essential (primary) hypertension: Secondary | ICD-10-CM | POA: Diagnosis not present

## 2016-11-05 DIAGNOSIS — J449 Chronic obstructive pulmonary disease, unspecified: Secondary | ICD-10-CM | POA: Diagnosis not present

## 2016-11-17 DIAGNOSIS — F0281 Dementia in other diseases classified elsewhere with behavioral disturbance: Secondary | ICD-10-CM | POA: Diagnosis not present

## 2016-11-17 DIAGNOSIS — F419 Anxiety disorder, unspecified: Secondary | ICD-10-CM | POA: Diagnosis not present

## 2016-11-17 DIAGNOSIS — F5105 Insomnia due to other mental disorder: Secondary | ICD-10-CM | POA: Diagnosis not present

## 2016-11-18 DIAGNOSIS — Z79899 Other long term (current) drug therapy: Secondary | ICD-10-CM | POA: Diagnosis not present

## 2016-11-21 DIAGNOSIS — G4700 Insomnia, unspecified: Secondary | ICD-10-CM | POA: Diagnosis not present

## 2016-11-21 DIAGNOSIS — R278 Other lack of coordination: Secondary | ICD-10-CM | POA: Diagnosis not present

## 2016-11-21 DIAGNOSIS — K219 Gastro-esophageal reflux disease without esophagitis: Secondary | ICD-10-CM | POA: Diagnosis not present

## 2016-11-21 DIAGNOSIS — M6281 Muscle weakness (generalized): Secondary | ICD-10-CM | POA: Diagnosis not present

## 2016-11-21 DIAGNOSIS — R1319 Other dysphagia: Secondary | ICD-10-CM | POA: Diagnosis not present

## 2016-11-21 DIAGNOSIS — G9340 Encephalopathy, unspecified: Secondary | ICD-10-CM | POA: Diagnosis not present

## 2016-11-21 DIAGNOSIS — F339 Major depressive disorder, recurrent, unspecified: Secondary | ICD-10-CM | POA: Diagnosis not present

## 2016-11-21 DIAGNOSIS — R41841 Cognitive communication deficit: Secondary | ICD-10-CM | POA: Diagnosis not present

## 2016-11-21 DIAGNOSIS — F419 Anxiety disorder, unspecified: Secondary | ICD-10-CM | POA: Diagnosis not present

## 2016-11-21 DIAGNOSIS — L089 Local infection of the skin and subcutaneous tissue, unspecified: Secondary | ICD-10-CM | POA: Diagnosis not present

## 2016-11-21 DIAGNOSIS — G8929 Other chronic pain: Secondary | ICD-10-CM | POA: Diagnosis not present

## 2016-11-21 DIAGNOSIS — E876 Hypokalemia: Secondary | ICD-10-CM | POA: Diagnosis not present

## 2016-11-21 DIAGNOSIS — I129 Hypertensive chronic kidney disease with stage 1 through stage 4 chronic kidney disease, or unspecified chronic kidney disease: Secondary | ICD-10-CM | POA: Diagnosis not present

## 2016-11-21 DIAGNOSIS — I10 Essential (primary) hypertension: Secondary | ICD-10-CM | POA: Diagnosis not present

## 2016-11-21 DIAGNOSIS — I2510 Atherosclerotic heart disease of native coronary artery without angina pectoris: Secondary | ICD-10-CM | POA: Diagnosis not present

## 2016-11-21 DIAGNOSIS — E785 Hyperlipidemia, unspecified: Secondary | ICD-10-CM | POA: Diagnosis not present

## 2016-11-21 DIAGNOSIS — J449 Chronic obstructive pulmonary disease, unspecified: Secondary | ICD-10-CM | POA: Diagnosis not present

## 2016-11-21 DIAGNOSIS — F0391 Unspecified dementia with behavioral disturbance: Secondary | ICD-10-CM | POA: Diagnosis not present

## 2016-11-21 DIAGNOSIS — N390 Urinary tract infection, site not specified: Secondary | ICD-10-CM | POA: Diagnosis not present

## 2016-11-21 DIAGNOSIS — F319 Bipolar disorder, unspecified: Secondary | ICD-10-CM | POA: Diagnosis not present

## 2016-11-21 DIAGNOSIS — R441 Visual hallucinations: Secondary | ICD-10-CM | POA: Diagnosis not present

## 2016-11-21 DIAGNOSIS — N189 Chronic kidney disease, unspecified: Secondary | ICD-10-CM | POA: Diagnosis not present

## 2016-11-21 DIAGNOSIS — F05 Delirium due to known physiological condition: Secondary | ICD-10-CM | POA: Diagnosis not present

## 2016-11-21 DIAGNOSIS — G9341 Metabolic encephalopathy: Secondary | ICD-10-CM | POA: Diagnosis not present

## 2016-11-21 DIAGNOSIS — I714 Abdominal aortic aneurysm, without rupture: Secondary | ICD-10-CM | POA: Diagnosis not present

## 2016-12-05 DIAGNOSIS — J449 Chronic obstructive pulmonary disease, unspecified: Secondary | ICD-10-CM | POA: Diagnosis not present

## 2016-12-05 DIAGNOSIS — R531 Weakness: Secondary | ICD-10-CM | POA: Diagnosis not present

## 2016-12-05 DIAGNOSIS — I259 Chronic ischemic heart disease, unspecified: Secondary | ICD-10-CM | POA: Diagnosis not present

## 2016-12-05 DIAGNOSIS — E119 Type 2 diabetes mellitus without complications: Secondary | ICD-10-CM | POA: Diagnosis not present

## 2016-12-05 DIAGNOSIS — I10 Essential (primary) hypertension: Secondary | ICD-10-CM | POA: Diagnosis not present

## 2016-12-08 DIAGNOSIS — F419 Anxiety disorder, unspecified: Secondary | ICD-10-CM | POA: Diagnosis not present

## 2016-12-08 DIAGNOSIS — F0281 Dementia in other diseases classified elsewhere with behavioral disturbance: Secondary | ICD-10-CM | POA: Diagnosis not present

## 2016-12-08 DIAGNOSIS — F5105 Insomnia due to other mental disorder: Secondary | ICD-10-CM | POA: Diagnosis not present

## 2016-12-17 DIAGNOSIS — F419 Anxiety disorder, unspecified: Secondary | ICD-10-CM | POA: Diagnosis not present

## 2016-12-17 DIAGNOSIS — F0281 Dementia in other diseases classified elsewhere with behavioral disturbance: Secondary | ICD-10-CM | POA: Diagnosis not present

## 2016-12-17 DIAGNOSIS — F5105 Insomnia due to other mental disorder: Secondary | ICD-10-CM | POA: Diagnosis not present

## 2016-12-22 DIAGNOSIS — R531 Weakness: Secondary | ICD-10-CM | POA: Diagnosis not present

## 2016-12-22 DIAGNOSIS — I10 Essential (primary) hypertension: Secondary | ICD-10-CM | POA: Diagnosis not present

## 2016-12-22 DIAGNOSIS — I259 Chronic ischemic heart disease, unspecified: Secondary | ICD-10-CM | POA: Diagnosis not present

## 2016-12-22 DIAGNOSIS — J449 Chronic obstructive pulmonary disease, unspecified: Secondary | ICD-10-CM | POA: Diagnosis not present

## 2016-12-22 DIAGNOSIS — E119 Type 2 diabetes mellitus without complications: Secondary | ICD-10-CM | POA: Diagnosis not present

## 2016-12-25 DIAGNOSIS — M6281 Muscle weakness (generalized): Secondary | ICD-10-CM | POA: Diagnosis not present

## 2016-12-26 DIAGNOSIS — M6281 Muscle weakness (generalized): Secondary | ICD-10-CM | POA: Diagnosis not present

## 2016-12-27 DIAGNOSIS — M6281 Muscle weakness (generalized): Secondary | ICD-10-CM | POA: Diagnosis not present

## 2016-12-29 DIAGNOSIS — M6281 Muscle weakness (generalized): Secondary | ICD-10-CM | POA: Diagnosis not present

## 2016-12-30 DIAGNOSIS — M6281 Muscle weakness (generalized): Secondary | ICD-10-CM | POA: Diagnosis not present

## 2016-12-31 DIAGNOSIS — M6281 Muscle weakness (generalized): Secondary | ICD-10-CM | POA: Diagnosis not present

## 2017-01-01 DIAGNOSIS — M6281 Muscle weakness (generalized): Secondary | ICD-10-CM | POA: Diagnosis not present

## 2017-01-02 DIAGNOSIS — M6281 Muscle weakness (generalized): Secondary | ICD-10-CM | POA: Diagnosis not present

## 2017-01-03 DIAGNOSIS — E7800 Pure hypercholesterolemia, unspecified: Secondary | ICD-10-CM | POA: Diagnosis not present

## 2017-01-03 DIAGNOSIS — D649 Anemia, unspecified: Secondary | ICD-10-CM | POA: Diagnosis not present

## 2017-01-05 DIAGNOSIS — M6281 Muscle weakness (generalized): Secondary | ICD-10-CM | POA: Diagnosis not present

## 2017-01-05 DIAGNOSIS — F419 Anxiety disorder, unspecified: Secondary | ICD-10-CM | POA: Diagnosis not present

## 2017-01-05 DIAGNOSIS — F5105 Insomnia due to other mental disorder: Secondary | ICD-10-CM | POA: Diagnosis not present

## 2017-01-05 DIAGNOSIS — F0281 Dementia in other diseases classified elsewhere with behavioral disturbance: Secondary | ICD-10-CM | POA: Diagnosis not present

## 2017-01-06 DIAGNOSIS — M6281 Muscle weakness (generalized): Secondary | ICD-10-CM | POA: Diagnosis not present

## 2017-01-08 DIAGNOSIS — Z79899 Other long term (current) drug therapy: Secondary | ICD-10-CM | POA: Diagnosis not present

## 2017-01-08 DIAGNOSIS — M6281 Muscle weakness (generalized): Secondary | ICD-10-CM | POA: Diagnosis not present

## 2017-01-15 DIAGNOSIS — J449 Chronic obstructive pulmonary disease, unspecified: Secondary | ICD-10-CM | POA: Diagnosis not present

## 2017-01-15 DIAGNOSIS — G9340 Encephalopathy, unspecified: Secondary | ICD-10-CM | POA: Diagnosis not present

## 2017-01-15 DIAGNOSIS — G9341 Metabolic encephalopathy: Secondary | ICD-10-CM | POA: Diagnosis not present

## 2017-01-15 DIAGNOSIS — R278 Other lack of coordination: Secondary | ICD-10-CM | POA: Diagnosis not present

## 2017-01-15 DIAGNOSIS — E876 Hypokalemia: Secondary | ICD-10-CM | POA: Diagnosis not present

## 2017-01-15 DIAGNOSIS — F05 Delirium due to known physiological condition: Secondary | ICD-10-CM | POA: Diagnosis not present

## 2017-01-15 DIAGNOSIS — F0391 Unspecified dementia with behavioral disturbance: Secondary | ICD-10-CM | POA: Diagnosis not present

## 2017-01-15 DIAGNOSIS — F319 Bipolar disorder, unspecified: Secondary | ICD-10-CM | POA: Diagnosis not present

## 2017-01-19 DIAGNOSIS — F419 Anxiety disorder, unspecified: Secondary | ICD-10-CM | POA: Diagnosis not present

## 2017-01-19 DIAGNOSIS — F5105 Insomnia due to other mental disorder: Secondary | ICD-10-CM | POA: Diagnosis not present

## 2017-01-19 DIAGNOSIS — F0281 Dementia in other diseases classified elsewhere with behavioral disturbance: Secondary | ICD-10-CM | POA: Diagnosis not present

## 2017-02-09 DIAGNOSIS — F5105 Insomnia due to other mental disorder: Secondary | ICD-10-CM | POA: Diagnosis not present

## 2017-02-09 DIAGNOSIS — F419 Anxiety disorder, unspecified: Secondary | ICD-10-CM | POA: Diagnosis not present

## 2017-02-09 DIAGNOSIS — F0281 Dementia in other diseases classified elsewhere with behavioral disturbance: Secondary | ICD-10-CM | POA: Diagnosis not present

## 2017-02-12 DIAGNOSIS — F05 Delirium due to known physiological condition: Secondary | ICD-10-CM | POA: Diagnosis not present

## 2017-02-12 DIAGNOSIS — G9340 Encephalopathy, unspecified: Secondary | ICD-10-CM | POA: Diagnosis not present

## 2017-02-12 DIAGNOSIS — F319 Bipolar disorder, unspecified: Secondary | ICD-10-CM | POA: Diagnosis not present

## 2017-02-12 DIAGNOSIS — J449 Chronic obstructive pulmonary disease, unspecified: Secondary | ICD-10-CM | POA: Diagnosis not present

## 2017-02-12 DIAGNOSIS — R278 Other lack of coordination: Secondary | ICD-10-CM | POA: Diagnosis not present

## 2017-02-12 DIAGNOSIS — E876 Hypokalemia: Secondary | ICD-10-CM | POA: Diagnosis not present

## 2017-02-12 DIAGNOSIS — F0391 Unspecified dementia with behavioral disturbance: Secondary | ICD-10-CM | POA: Diagnosis not present

## 2017-02-12 DIAGNOSIS — G9341 Metabolic encephalopathy: Secondary | ICD-10-CM | POA: Diagnosis not present

## 2017-02-16 DIAGNOSIS — F419 Anxiety disorder, unspecified: Secondary | ICD-10-CM | POA: Diagnosis not present

## 2017-02-16 DIAGNOSIS — F5105 Insomnia due to other mental disorder: Secondary | ICD-10-CM | POA: Diagnosis not present

## 2017-02-16 DIAGNOSIS — F0281 Dementia in other diseases classified elsewhere with behavioral disturbance: Secondary | ICD-10-CM | POA: Diagnosis not present

## 2017-02-17 DIAGNOSIS — I2510 Atherosclerotic heart disease of native coronary artery without angina pectoris: Secondary | ICD-10-CM | POA: Diagnosis not present

## 2017-02-17 DIAGNOSIS — M159 Polyosteoarthritis, unspecified: Secondary | ICD-10-CM | POA: Diagnosis not present

## 2017-02-17 DIAGNOSIS — I129 Hypertensive chronic kidney disease with stage 1 through stage 4 chronic kidney disease, or unspecified chronic kidney disease: Secondary | ICD-10-CM | POA: Diagnosis not present

## 2017-02-17 DIAGNOSIS — R52 Pain, unspecified: Secondary | ICD-10-CM | POA: Diagnosis not present

## 2017-02-18 DIAGNOSIS — Z79899 Other long term (current) drug therapy: Secondary | ICD-10-CM | POA: Diagnosis not present

## 2017-02-25 DIAGNOSIS — I739 Peripheral vascular disease, unspecified: Secondary | ICD-10-CM | POA: Diagnosis not present

## 2017-02-25 DIAGNOSIS — M79675 Pain in left toe(s): Secondary | ICD-10-CM | POA: Diagnosis not present

## 2017-02-25 DIAGNOSIS — B351 Tinea unguium: Secondary | ICD-10-CM | POA: Diagnosis not present

## 2017-02-25 DIAGNOSIS — M79674 Pain in right toe(s): Secondary | ICD-10-CM | POA: Diagnosis not present

## 2017-02-25 DIAGNOSIS — L603 Nail dystrophy: Secondary | ICD-10-CM | POA: Diagnosis not present

## 2017-03-06 DIAGNOSIS — Z79899 Other long term (current) drug therapy: Secondary | ICD-10-CM | POA: Diagnosis not present

## 2017-03-06 DIAGNOSIS — I2510 Atherosclerotic heart disease of native coronary artery without angina pectoris: Secondary | ICD-10-CM | POA: Diagnosis not present

## 2017-03-09 DIAGNOSIS — F419 Anxiety disorder, unspecified: Secondary | ICD-10-CM | POA: Diagnosis not present

## 2017-03-09 DIAGNOSIS — F5105 Insomnia due to other mental disorder: Secondary | ICD-10-CM | POA: Diagnosis not present

## 2017-03-09 DIAGNOSIS — F0281 Dementia in other diseases classified elsewhere with behavioral disturbance: Secondary | ICD-10-CM | POA: Diagnosis not present

## 2017-03-14 DIAGNOSIS — F05 Delirium due to known physiological condition: Secondary | ICD-10-CM | POA: Diagnosis not present

## 2017-03-14 DIAGNOSIS — F0391 Unspecified dementia with behavioral disturbance: Secondary | ICD-10-CM | POA: Diagnosis not present

## 2017-03-14 DIAGNOSIS — J449 Chronic obstructive pulmonary disease, unspecified: Secondary | ICD-10-CM | POA: Diagnosis not present

## 2017-03-14 DIAGNOSIS — F319 Bipolar disorder, unspecified: Secondary | ICD-10-CM | POA: Diagnosis not present

## 2017-03-14 DIAGNOSIS — G9341 Metabolic encephalopathy: Secondary | ICD-10-CM | POA: Diagnosis not present

## 2017-03-14 DIAGNOSIS — E876 Hypokalemia: Secondary | ICD-10-CM | POA: Diagnosis not present

## 2017-03-14 DIAGNOSIS — R278 Other lack of coordination: Secondary | ICD-10-CM | POA: Diagnosis not present

## 2017-03-14 DIAGNOSIS — G9340 Encephalopathy, unspecified: Secondary | ICD-10-CM | POA: Diagnosis not present

## 2017-03-17 DIAGNOSIS — I2510 Atherosclerotic heart disease of native coronary artery without angina pectoris: Secondary | ICD-10-CM | POA: Diagnosis not present

## 2017-03-17 DIAGNOSIS — R600 Localized edema: Secondary | ICD-10-CM | POA: Diagnosis not present

## 2017-03-17 DIAGNOSIS — N189 Chronic kidney disease, unspecified: Secondary | ICD-10-CM | POA: Diagnosis not present

## 2017-03-17 DIAGNOSIS — F0391 Unspecified dementia with behavioral disturbance: Secondary | ICD-10-CM | POA: Diagnosis not present

## 2017-03-18 DIAGNOSIS — I5030 Unspecified diastolic (congestive) heart failure: Secondary | ICD-10-CM | POA: Diagnosis not present

## 2017-03-18 DIAGNOSIS — Z79899 Other long term (current) drug therapy: Secondary | ICD-10-CM | POA: Diagnosis not present

## 2017-03-19 DIAGNOSIS — F0281 Dementia in other diseases classified elsewhere with behavioral disturbance: Secondary | ICD-10-CM | POA: Diagnosis not present

## 2017-03-19 DIAGNOSIS — F5105 Insomnia due to other mental disorder: Secondary | ICD-10-CM | POA: Diagnosis not present

## 2017-03-19 DIAGNOSIS — F419 Anxiety disorder, unspecified: Secondary | ICD-10-CM | POA: Diagnosis not present

## 2017-03-31 DIAGNOSIS — R5381 Other malaise: Secondary | ICD-10-CM | POA: Diagnosis not present

## 2017-03-31 DIAGNOSIS — R05 Cough: Secondary | ICD-10-CM | POA: Diagnosis not present

## 2017-03-31 DIAGNOSIS — F0391 Unspecified dementia with behavioral disturbance: Secondary | ICD-10-CM | POA: Diagnosis not present

## 2017-04-07 DIAGNOSIS — F419 Anxiety disorder, unspecified: Secondary | ICD-10-CM | POA: Diagnosis not present

## 2017-04-07 DIAGNOSIS — F0281 Dementia in other diseases classified elsewhere with behavioral disturbance: Secondary | ICD-10-CM | POA: Diagnosis not present

## 2017-04-07 DIAGNOSIS — F5105 Insomnia due to other mental disorder: Secondary | ICD-10-CM | POA: Diagnosis not present

## 2017-04-11 DIAGNOSIS — E876 Hypokalemia: Secondary | ICD-10-CM | POA: Diagnosis not present

## 2017-04-11 DIAGNOSIS — F05 Delirium due to known physiological condition: Secondary | ICD-10-CM | POA: Diagnosis not present

## 2017-04-11 DIAGNOSIS — R278 Other lack of coordination: Secondary | ICD-10-CM | POA: Diagnosis not present

## 2017-04-11 DIAGNOSIS — G9340 Encephalopathy, unspecified: Secondary | ICD-10-CM | POA: Diagnosis not present

## 2017-04-11 DIAGNOSIS — F0391 Unspecified dementia with behavioral disturbance: Secondary | ICD-10-CM | POA: Diagnosis not present

## 2017-04-11 DIAGNOSIS — F319 Bipolar disorder, unspecified: Secondary | ICD-10-CM | POA: Diagnosis not present

## 2017-04-11 DIAGNOSIS — G9341 Metabolic encephalopathy: Secondary | ICD-10-CM | POA: Diagnosis not present

## 2017-04-11 DIAGNOSIS — J449 Chronic obstructive pulmonary disease, unspecified: Secondary | ICD-10-CM | POA: Diagnosis not present

## 2017-04-18 DIAGNOSIS — F0281 Dementia in other diseases classified elsewhere with behavioral disturbance: Secondary | ICD-10-CM | POA: Diagnosis not present

## 2017-04-18 DIAGNOSIS — F419 Anxiety disorder, unspecified: Secondary | ICD-10-CM | POA: Diagnosis not present

## 2017-04-18 DIAGNOSIS — F5105 Insomnia due to other mental disorder: Secondary | ICD-10-CM | POA: Diagnosis not present

## 2017-04-28 DIAGNOSIS — N189 Chronic kidney disease, unspecified: Secondary | ICD-10-CM | POA: Diagnosis not present

## 2017-04-28 DIAGNOSIS — F319 Bipolar disorder, unspecified: Secondary | ICD-10-CM | POA: Diagnosis not present

## 2017-04-28 DIAGNOSIS — F0391 Unspecified dementia with behavioral disturbance: Secondary | ICD-10-CM | POA: Diagnosis not present

## 2017-04-28 DIAGNOSIS — M6281 Muscle weakness (generalized): Secondary | ICD-10-CM | POA: Diagnosis not present

## 2017-05-05 DIAGNOSIS — F0391 Unspecified dementia with behavioral disturbance: Secondary | ICD-10-CM | POA: Diagnosis not present

## 2017-05-05 DIAGNOSIS — F319 Bipolar disorder, unspecified: Secondary | ICD-10-CM | POA: Diagnosis not present

## 2017-05-05 DIAGNOSIS — G9340 Encephalopathy, unspecified: Secondary | ICD-10-CM | POA: Diagnosis not present

## 2017-05-12 DIAGNOSIS — F0281 Dementia in other diseases classified elsewhere with behavioral disturbance: Secondary | ICD-10-CM | POA: Diagnosis not present

## 2017-05-12 DIAGNOSIS — F5105 Insomnia due to other mental disorder: Secondary | ICD-10-CM | POA: Diagnosis not present

## 2017-05-12 DIAGNOSIS — F419 Anxiety disorder, unspecified: Secondary | ICD-10-CM | POA: Diagnosis not present

## 2017-05-20 DIAGNOSIS — F5105 Insomnia due to other mental disorder: Secondary | ICD-10-CM | POA: Diagnosis not present

## 2017-05-20 DIAGNOSIS — F0281 Dementia in other diseases classified elsewhere with behavioral disturbance: Secondary | ICD-10-CM | POA: Diagnosis not present

## 2017-05-20 DIAGNOSIS — F419 Anxiety disorder, unspecified: Secondary | ICD-10-CM | POA: Diagnosis not present

## 2017-05-25 DIAGNOSIS — M6281 Muscle weakness (generalized): Secondary | ICD-10-CM | POA: Diagnosis not present

## 2017-05-26 DIAGNOSIS — M6281 Muscle weakness (generalized): Secondary | ICD-10-CM | POA: Diagnosis not present

## 2017-05-27 DIAGNOSIS — M6281 Muscle weakness (generalized): Secondary | ICD-10-CM | POA: Diagnosis not present

## 2017-05-28 DIAGNOSIS — M6281 Muscle weakness (generalized): Secondary | ICD-10-CM | POA: Diagnosis not present

## 2017-05-29 DIAGNOSIS — M6281 Muscle weakness (generalized): Secondary | ICD-10-CM | POA: Diagnosis not present

## 2017-06-01 DIAGNOSIS — M6281 Muscle weakness (generalized): Secondary | ICD-10-CM | POA: Diagnosis not present

## 2017-06-02 DIAGNOSIS — M6281 Muscle weakness (generalized): Secondary | ICD-10-CM | POA: Diagnosis not present

## 2017-06-03 DIAGNOSIS — M6281 Muscle weakness (generalized): Secondary | ICD-10-CM | POA: Diagnosis not present

## 2017-06-04 DIAGNOSIS — M6281 Muscle weakness (generalized): Secondary | ICD-10-CM | POA: Diagnosis not present

## 2017-06-05 DIAGNOSIS — M6281 Muscle weakness (generalized): Secondary | ICD-10-CM | POA: Diagnosis not present

## 2017-06-08 DIAGNOSIS — M6281 Muscle weakness (generalized): Secondary | ICD-10-CM | POA: Diagnosis not present

## 2017-06-09 DIAGNOSIS — M6281 Muscle weakness (generalized): Secondary | ICD-10-CM | POA: Diagnosis not present

## 2017-06-10 DIAGNOSIS — M6281 Muscle weakness (generalized): Secondary | ICD-10-CM | POA: Diagnosis not present

## 2017-06-11 DIAGNOSIS — M6281 Muscle weakness (generalized): Secondary | ICD-10-CM | POA: Diagnosis not present

## 2017-06-12 DIAGNOSIS — M6281 Muscle weakness (generalized): Secondary | ICD-10-CM | POA: Diagnosis not present

## 2017-06-13 DIAGNOSIS — G9341 Metabolic encephalopathy: Secondary | ICD-10-CM | POA: Diagnosis not present

## 2017-06-13 DIAGNOSIS — E876 Hypokalemia: Secondary | ICD-10-CM | POA: Diagnosis not present

## 2017-06-13 DIAGNOSIS — F0391 Unspecified dementia with behavioral disturbance: Secondary | ICD-10-CM | POA: Diagnosis not present

## 2017-06-13 DIAGNOSIS — F05 Delirium due to known physiological condition: Secondary | ICD-10-CM | POA: Diagnosis not present

## 2017-06-13 DIAGNOSIS — G9340 Encephalopathy, unspecified: Secondary | ICD-10-CM | POA: Diagnosis not present

## 2017-06-13 DIAGNOSIS — R278 Other lack of coordination: Secondary | ICD-10-CM | POA: Diagnosis not present

## 2017-06-13 DIAGNOSIS — J449 Chronic obstructive pulmonary disease, unspecified: Secondary | ICD-10-CM | POA: Diagnosis not present

## 2017-06-13 DIAGNOSIS — F319 Bipolar disorder, unspecified: Secondary | ICD-10-CM | POA: Diagnosis not present

## 2017-06-15 DIAGNOSIS — M6281 Muscle weakness (generalized): Secondary | ICD-10-CM | POA: Diagnosis not present

## 2017-06-16 DIAGNOSIS — M6281 Muscle weakness (generalized): Secondary | ICD-10-CM | POA: Diagnosis not present

## 2017-06-19 DIAGNOSIS — F419 Anxiety disorder, unspecified: Secondary | ICD-10-CM | POA: Diagnosis not present

## 2017-06-19 DIAGNOSIS — F5105 Insomnia due to other mental disorder: Secondary | ICD-10-CM | POA: Diagnosis not present

## 2017-06-19 DIAGNOSIS — F0281 Dementia in other diseases classified elsewhere with behavioral disturbance: Secondary | ICD-10-CM | POA: Diagnosis not present

## 2017-06-23 DIAGNOSIS — F419 Anxiety disorder, unspecified: Secondary | ICD-10-CM | POA: Diagnosis not present

## 2017-06-23 DIAGNOSIS — F0281 Dementia in other diseases classified elsewhere with behavioral disturbance: Secondary | ICD-10-CM | POA: Diagnosis not present

## 2017-06-23 DIAGNOSIS — F5105 Insomnia due to other mental disorder: Secondary | ICD-10-CM | POA: Diagnosis not present

## 2017-07-14 DIAGNOSIS — D649 Anemia, unspecified: Secondary | ICD-10-CM | POA: Diagnosis not present

## 2017-07-18 DIAGNOSIS — F05 Delirium due to known physiological condition: Secondary | ICD-10-CM | POA: Diagnosis not present

## 2017-07-18 DIAGNOSIS — J449 Chronic obstructive pulmonary disease, unspecified: Secondary | ICD-10-CM | POA: Diagnosis not present

## 2017-07-18 DIAGNOSIS — G9341 Metabolic encephalopathy: Secondary | ICD-10-CM | POA: Diagnosis not present

## 2017-07-18 DIAGNOSIS — F319 Bipolar disorder, unspecified: Secondary | ICD-10-CM | POA: Diagnosis not present

## 2017-07-18 DIAGNOSIS — E876 Hypokalemia: Secondary | ICD-10-CM | POA: Diagnosis not present

## 2017-07-18 DIAGNOSIS — R278 Other lack of coordination: Secondary | ICD-10-CM | POA: Diagnosis not present

## 2017-07-18 DIAGNOSIS — G9340 Encephalopathy, unspecified: Secondary | ICD-10-CM | POA: Diagnosis not present

## 2017-07-18 DIAGNOSIS — F0391 Unspecified dementia with behavioral disturbance: Secondary | ICD-10-CM | POA: Diagnosis not present

## 2017-07-20 DIAGNOSIS — F0281 Dementia in other diseases classified elsewhere with behavioral disturbance: Secondary | ICD-10-CM | POA: Diagnosis not present

## 2017-07-20 DIAGNOSIS — F419 Anxiety disorder, unspecified: Secondary | ICD-10-CM | POA: Diagnosis not present

## 2017-07-20 DIAGNOSIS — F5105 Insomnia due to other mental disorder: Secondary | ICD-10-CM | POA: Diagnosis not present

## 2017-07-21 DIAGNOSIS — I10 Essential (primary) hypertension: Secondary | ICD-10-CM | POA: Diagnosis not present

## 2017-07-21 DIAGNOSIS — F319 Bipolar disorder, unspecified: Secondary | ICD-10-CM | POA: Diagnosis not present

## 2017-07-21 DIAGNOSIS — F0391 Unspecified dementia with behavioral disturbance: Secondary | ICD-10-CM | POA: Diagnosis not present

## 2017-07-21 DIAGNOSIS — R278 Other lack of coordination: Secondary | ICD-10-CM | POA: Diagnosis not present

## 2017-07-28 DIAGNOSIS — F419 Anxiety disorder, unspecified: Secondary | ICD-10-CM | POA: Diagnosis not present

## 2017-07-28 DIAGNOSIS — F0281 Dementia in other diseases classified elsewhere with behavioral disturbance: Secondary | ICD-10-CM | POA: Diagnosis not present

## 2017-07-28 DIAGNOSIS — F5105 Insomnia due to other mental disorder: Secondary | ICD-10-CM | POA: Diagnosis not present

## 2017-08-05 DIAGNOSIS — R278 Other lack of coordination: Secondary | ICD-10-CM | POA: Diagnosis not present

## 2017-08-05 DIAGNOSIS — F319 Bipolar disorder, unspecified: Secondary | ICD-10-CM | POA: Diagnosis not present

## 2017-08-05 DIAGNOSIS — I10 Essential (primary) hypertension: Secondary | ICD-10-CM | POA: Diagnosis not present

## 2017-08-05 DIAGNOSIS — F0391 Unspecified dementia with behavioral disturbance: Secondary | ICD-10-CM | POA: Diagnosis not present

## 2017-08-11 DIAGNOSIS — R600 Localized edema: Secondary | ICD-10-CM | POA: Diagnosis not present

## 2017-08-11 DIAGNOSIS — I10 Essential (primary) hypertension: Secondary | ICD-10-CM | POA: Diagnosis not present

## 2017-08-11 DIAGNOSIS — F0391 Unspecified dementia with behavioral disturbance: Secondary | ICD-10-CM | POA: Diagnosis not present

## 2017-08-11 DIAGNOSIS — M25552 Pain in left hip: Secondary | ICD-10-CM | POA: Diagnosis not present

## 2017-08-12 DIAGNOSIS — F0391 Unspecified dementia with behavioral disturbance: Secondary | ICD-10-CM | POA: Diagnosis not present

## 2017-08-13 DIAGNOSIS — Z79899 Other long term (current) drug therapy: Secondary | ICD-10-CM | POA: Diagnosis not present

## 2017-08-13 DIAGNOSIS — F0391 Unspecified dementia with behavioral disturbance: Secondary | ICD-10-CM | POA: Diagnosis not present

## 2017-08-14 DIAGNOSIS — F0391 Unspecified dementia with behavioral disturbance: Secondary | ICD-10-CM | POA: Diagnosis not present

## 2017-08-15 DIAGNOSIS — E876 Hypokalemia: Secondary | ICD-10-CM | POA: Diagnosis not present

## 2017-08-15 DIAGNOSIS — R278 Other lack of coordination: Secondary | ICD-10-CM | POA: Diagnosis not present

## 2017-08-15 DIAGNOSIS — G9341 Metabolic encephalopathy: Secondary | ICD-10-CM | POA: Diagnosis not present

## 2017-08-15 DIAGNOSIS — F319 Bipolar disorder, unspecified: Secondary | ICD-10-CM | POA: Diagnosis not present

## 2017-08-15 DIAGNOSIS — F05 Delirium due to known physiological condition: Secondary | ICD-10-CM | POA: Diagnosis not present

## 2017-08-15 DIAGNOSIS — J449 Chronic obstructive pulmonary disease, unspecified: Secondary | ICD-10-CM | POA: Diagnosis not present

## 2017-08-15 DIAGNOSIS — F0391 Unspecified dementia with behavioral disturbance: Secondary | ICD-10-CM | POA: Diagnosis not present

## 2017-08-15 DIAGNOSIS — G9340 Encephalopathy, unspecified: Secondary | ICD-10-CM | POA: Diagnosis not present

## 2017-08-16 DIAGNOSIS — F0391 Unspecified dementia with behavioral disturbance: Secondary | ICD-10-CM | POA: Diagnosis not present

## 2017-08-17 DIAGNOSIS — F0391 Unspecified dementia with behavioral disturbance: Secondary | ICD-10-CM | POA: Diagnosis not present

## 2017-08-18 DIAGNOSIS — F0391 Unspecified dementia with behavioral disturbance: Secondary | ICD-10-CM | POA: Diagnosis not present

## 2017-08-19 DIAGNOSIS — F0391 Unspecified dementia with behavioral disturbance: Secondary | ICD-10-CM | POA: Diagnosis not present

## 2017-08-20 DIAGNOSIS — F419 Anxiety disorder, unspecified: Secondary | ICD-10-CM | POA: Diagnosis not present

## 2017-08-20 DIAGNOSIS — I10 Essential (primary) hypertension: Secondary | ICD-10-CM | POA: Diagnosis not present

## 2017-08-20 DIAGNOSIS — M25552 Pain in left hip: Secondary | ICD-10-CM | POA: Diagnosis not present

## 2017-08-20 DIAGNOSIS — F5105 Insomnia due to other mental disorder: Secondary | ICD-10-CM | POA: Diagnosis not present

## 2017-08-20 DIAGNOSIS — F0391 Unspecified dementia with behavioral disturbance: Secondary | ICD-10-CM | POA: Diagnosis not present

## 2017-08-20 DIAGNOSIS — F0281 Dementia in other diseases classified elsewhere with behavioral disturbance: Secondary | ICD-10-CM | POA: Diagnosis not present

## 2017-08-20 DIAGNOSIS — R600 Localized edema: Secondary | ICD-10-CM | POA: Diagnosis not present

## 2017-08-21 DIAGNOSIS — R2242 Localized swelling, mass and lump, left lower limb: Secondary | ICD-10-CM | POA: Diagnosis not present

## 2017-08-24 DIAGNOSIS — F0391 Unspecified dementia with behavioral disturbance: Secondary | ICD-10-CM | POA: Diagnosis not present

## 2017-08-25 DIAGNOSIS — F0391 Unspecified dementia with behavioral disturbance: Secondary | ICD-10-CM | POA: Diagnosis not present

## 2017-08-26 DIAGNOSIS — F0391 Unspecified dementia with behavioral disturbance: Secondary | ICD-10-CM | POA: Diagnosis not present

## 2017-08-27 DIAGNOSIS — F0391 Unspecified dementia with behavioral disturbance: Secondary | ICD-10-CM | POA: Diagnosis not present

## 2017-08-28 DIAGNOSIS — F0391 Unspecified dementia with behavioral disturbance: Secondary | ICD-10-CM | POA: Diagnosis not present

## 2017-08-29 DIAGNOSIS — F0391 Unspecified dementia with behavioral disturbance: Secondary | ICD-10-CM | POA: Diagnosis not present

## 2017-08-31 DIAGNOSIS — F0391 Unspecified dementia with behavioral disturbance: Secondary | ICD-10-CM | POA: Diagnosis not present

## 2017-09-01 DIAGNOSIS — F0391 Unspecified dementia with behavioral disturbance: Secondary | ICD-10-CM | POA: Diagnosis not present

## 2017-09-02 DIAGNOSIS — F0391 Unspecified dementia with behavioral disturbance: Secondary | ICD-10-CM | POA: Diagnosis not present

## 2017-09-03 DIAGNOSIS — F0391 Unspecified dementia with behavioral disturbance: Secondary | ICD-10-CM | POA: Diagnosis not present

## 2017-09-04 DIAGNOSIS — F0391 Unspecified dementia with behavioral disturbance: Secondary | ICD-10-CM | POA: Diagnosis not present

## 2017-09-07 DIAGNOSIS — F0391 Unspecified dementia with behavioral disturbance: Secondary | ICD-10-CM | POA: Diagnosis not present

## 2017-09-08 DIAGNOSIS — F0391 Unspecified dementia with behavioral disturbance: Secondary | ICD-10-CM | POA: Diagnosis not present

## 2017-09-08 DIAGNOSIS — F0281 Dementia in other diseases classified elsewhere with behavioral disturbance: Secondary | ICD-10-CM | POA: Diagnosis not present

## 2017-09-08 DIAGNOSIS — F5105 Insomnia due to other mental disorder: Secondary | ICD-10-CM | POA: Diagnosis not present

## 2017-09-08 DIAGNOSIS — F419 Anxiety disorder, unspecified: Secondary | ICD-10-CM | POA: Diagnosis not present

## 2017-09-09 DIAGNOSIS — F0391 Unspecified dementia with behavioral disturbance: Secondary | ICD-10-CM | POA: Diagnosis not present

## 2017-09-10 DIAGNOSIS — F0391 Unspecified dementia with behavioral disturbance: Secondary | ICD-10-CM | POA: Diagnosis not present

## 2017-09-10 DIAGNOSIS — H538 Other visual disturbances: Secondary | ICD-10-CM | POA: Diagnosis not present

## 2017-09-10 DIAGNOSIS — R441 Visual hallucinations: Secondary | ICD-10-CM | POA: Diagnosis not present

## 2017-09-10 DIAGNOSIS — M6281 Muscle weakness (generalized): Secondary | ICD-10-CM | POA: Diagnosis not present

## 2017-09-11 DIAGNOSIS — F0391 Unspecified dementia with behavioral disturbance: Secondary | ICD-10-CM | POA: Diagnosis not present

## 2017-09-12 DIAGNOSIS — G9341 Metabolic encephalopathy: Secondary | ICD-10-CM | POA: Diagnosis not present

## 2017-09-12 DIAGNOSIS — J449 Chronic obstructive pulmonary disease, unspecified: Secondary | ICD-10-CM | POA: Diagnosis not present

## 2017-09-12 DIAGNOSIS — E876 Hypokalemia: Secondary | ICD-10-CM | POA: Diagnosis not present

## 2017-09-12 DIAGNOSIS — R278 Other lack of coordination: Secondary | ICD-10-CM | POA: Diagnosis not present

## 2017-09-12 DIAGNOSIS — G9340 Encephalopathy, unspecified: Secondary | ICD-10-CM | POA: Diagnosis not present

## 2017-09-12 DIAGNOSIS — F05 Delirium due to known physiological condition: Secondary | ICD-10-CM | POA: Diagnosis not present

## 2017-09-12 DIAGNOSIS — F319 Bipolar disorder, unspecified: Secondary | ICD-10-CM | POA: Diagnosis not present

## 2017-09-12 DIAGNOSIS — F0391 Unspecified dementia with behavioral disturbance: Secondary | ICD-10-CM | POA: Diagnosis not present

## 2017-09-14 DIAGNOSIS — F0391 Unspecified dementia with behavioral disturbance: Secondary | ICD-10-CM | POA: Diagnosis not present

## 2017-09-15 DIAGNOSIS — F0391 Unspecified dementia with behavioral disturbance: Secondary | ICD-10-CM | POA: Diagnosis not present

## 2017-09-16 DIAGNOSIS — Z79899 Other long term (current) drug therapy: Secondary | ICD-10-CM | POA: Diagnosis not present

## 2017-09-16 DIAGNOSIS — F0391 Unspecified dementia with behavioral disturbance: Secondary | ICD-10-CM | POA: Diagnosis not present

## 2017-09-17 DIAGNOSIS — F0391 Unspecified dementia with behavioral disturbance: Secondary | ICD-10-CM | POA: Diagnosis not present

## 2017-09-18 DIAGNOSIS — F419 Anxiety disorder, unspecified: Secondary | ICD-10-CM | POA: Diagnosis not present

## 2017-09-18 DIAGNOSIS — F0281 Dementia in other diseases classified elsewhere with behavioral disturbance: Secondary | ICD-10-CM | POA: Diagnosis not present

## 2017-09-18 DIAGNOSIS — F5105 Insomnia due to other mental disorder: Secondary | ICD-10-CM | POA: Diagnosis not present

## 2017-09-18 DIAGNOSIS — F0391 Unspecified dementia with behavioral disturbance: Secondary | ICD-10-CM | POA: Diagnosis not present

## 2017-09-25 DIAGNOSIS — H02834 Dermatochalasis of left upper eyelid: Secondary | ICD-10-CM | POA: Diagnosis not present

## 2017-09-25 DIAGNOSIS — H04123 Dry eye syndrome of bilateral lacrimal glands: Secondary | ICD-10-CM | POA: Diagnosis not present

## 2017-09-25 DIAGNOSIS — H02831 Dermatochalasis of right upper eyelid: Secondary | ICD-10-CM | POA: Diagnosis not present

## 2017-09-25 DIAGNOSIS — H25813 Combined forms of age-related cataract, bilateral: Secondary | ICD-10-CM | POA: Diagnosis not present

## 2017-09-28 DIAGNOSIS — R278 Other lack of coordination: Secondary | ICD-10-CM | POA: Diagnosis not present

## 2017-09-28 DIAGNOSIS — I10 Essential (primary) hypertension: Secondary | ICD-10-CM | POA: Diagnosis not present

## 2017-09-28 DIAGNOSIS — F0391 Unspecified dementia with behavioral disturbance: Secondary | ICD-10-CM | POA: Diagnosis not present

## 2017-09-28 DIAGNOSIS — F319 Bipolar disorder, unspecified: Secondary | ICD-10-CM | POA: Diagnosis not present

## 2017-09-30 DIAGNOSIS — G301 Alzheimer's disease with late onset: Secondary | ICD-10-CM | POA: Diagnosis not present

## 2017-10-08 DIAGNOSIS — H04209 Unspecified epiphora, unspecified lacrimal gland: Secondary | ICD-10-CM | POA: Diagnosis not present

## 2017-10-08 DIAGNOSIS — H538 Other visual disturbances: Secondary | ICD-10-CM | POA: Diagnosis not present

## 2017-10-08 DIAGNOSIS — M25552 Pain in left hip: Secondary | ICD-10-CM | POA: Diagnosis not present

## 2017-10-08 DIAGNOSIS — F0391 Unspecified dementia with behavioral disturbance: Secondary | ICD-10-CM | POA: Diagnosis not present

## 2017-10-13 DIAGNOSIS — F5105 Insomnia due to other mental disorder: Secondary | ICD-10-CM | POA: Diagnosis not present

## 2017-10-13 DIAGNOSIS — F419 Anxiety disorder, unspecified: Secondary | ICD-10-CM | POA: Diagnosis not present

## 2017-10-13 DIAGNOSIS — F0281 Dementia in other diseases classified elsewhere with behavioral disturbance: Secondary | ICD-10-CM | POA: Diagnosis not present

## 2017-10-14 DIAGNOSIS — Z79899 Other long term (current) drug therapy: Secondary | ICD-10-CM | POA: Diagnosis not present

## 2017-10-15 DIAGNOSIS — F5105 Insomnia due to other mental disorder: Secondary | ICD-10-CM | POA: Diagnosis not present

## 2017-10-15 DIAGNOSIS — F419 Anxiety disorder, unspecified: Secondary | ICD-10-CM | POA: Diagnosis not present

## 2017-10-15 DIAGNOSIS — F0281 Dementia in other diseases classified elsewhere with behavioral disturbance: Secondary | ICD-10-CM | POA: Diagnosis not present

## 2017-10-17 DIAGNOSIS — E876 Hypokalemia: Secondary | ICD-10-CM | POA: Diagnosis not present

## 2017-10-17 DIAGNOSIS — G9340 Encephalopathy, unspecified: Secondary | ICD-10-CM | POA: Diagnosis not present

## 2017-10-17 DIAGNOSIS — F319 Bipolar disorder, unspecified: Secondary | ICD-10-CM | POA: Diagnosis not present

## 2017-10-17 DIAGNOSIS — G9341 Metabolic encephalopathy: Secondary | ICD-10-CM | POA: Diagnosis not present

## 2017-10-17 DIAGNOSIS — F05 Delirium due to known physiological condition: Secondary | ICD-10-CM | POA: Diagnosis not present

## 2017-10-17 DIAGNOSIS — R278 Other lack of coordination: Secondary | ICD-10-CM | POA: Diagnosis not present

## 2017-10-17 DIAGNOSIS — F0391 Unspecified dementia with behavioral disturbance: Secondary | ICD-10-CM | POA: Diagnosis not present

## 2017-10-17 DIAGNOSIS — J449 Chronic obstructive pulmonary disease, unspecified: Secondary | ICD-10-CM | POA: Diagnosis not present

## 2017-10-19 DIAGNOSIS — B351 Tinea unguium: Secondary | ICD-10-CM | POA: Diagnosis not present

## 2017-10-19 DIAGNOSIS — I739 Peripheral vascular disease, unspecified: Secondary | ICD-10-CM | POA: Diagnosis not present

## 2017-10-19 DIAGNOSIS — M79675 Pain in left toe(s): Secondary | ICD-10-CM | POA: Diagnosis not present

## 2017-10-19 DIAGNOSIS — M79674 Pain in right toe(s): Secondary | ICD-10-CM | POA: Diagnosis not present

## 2017-10-26 DIAGNOSIS — H25811 Combined forms of age-related cataract, right eye: Secondary | ICD-10-CM | POA: Diagnosis not present

## 2017-10-26 DIAGNOSIS — H25812 Combined forms of age-related cataract, left eye: Secondary | ICD-10-CM | POA: Diagnosis not present

## 2017-10-26 DIAGNOSIS — H04123 Dry eye syndrome of bilateral lacrimal glands: Secondary | ICD-10-CM | POA: Diagnosis not present

## 2017-10-27 DIAGNOSIS — F0391 Unspecified dementia with behavioral disturbance: Secondary | ICD-10-CM | POA: Diagnosis not present

## 2017-10-27 DIAGNOSIS — M25552 Pain in left hip: Secondary | ICD-10-CM | POA: Diagnosis not present

## 2017-10-27 DIAGNOSIS — H04209 Unspecified epiphora, unspecified lacrimal gland: Secondary | ICD-10-CM | POA: Diagnosis not present

## 2017-10-27 DIAGNOSIS — H538 Other visual disturbances: Secondary | ICD-10-CM | POA: Diagnosis not present

## 2017-11-04 DIAGNOSIS — R05 Cough: Secondary | ICD-10-CM | POA: Diagnosis not present

## 2017-11-13 DIAGNOSIS — Z79899 Other long term (current) drug therapy: Secondary | ICD-10-CM | POA: Diagnosis not present

## 2017-11-18 DIAGNOSIS — H25812 Combined forms of age-related cataract, left eye: Secondary | ICD-10-CM | POA: Diagnosis not present

## 2017-11-18 DIAGNOSIS — H2512 Age-related nuclear cataract, left eye: Secondary | ICD-10-CM | POA: Diagnosis not present

## 2017-11-22 DIAGNOSIS — R278 Other lack of coordination: Secondary | ICD-10-CM | POA: Diagnosis not present

## 2017-11-22 DIAGNOSIS — F319 Bipolar disorder, unspecified: Secondary | ICD-10-CM | POA: Diagnosis not present

## 2017-11-22 DIAGNOSIS — E876 Hypokalemia: Secondary | ICD-10-CM | POA: Diagnosis not present

## 2017-11-22 DIAGNOSIS — G9340 Encephalopathy, unspecified: Secondary | ICD-10-CM | POA: Diagnosis not present

## 2017-11-22 DIAGNOSIS — J449 Chronic obstructive pulmonary disease, unspecified: Secondary | ICD-10-CM | POA: Diagnosis not present

## 2017-11-22 DIAGNOSIS — F05 Delirium due to known physiological condition: Secondary | ICD-10-CM | POA: Diagnosis not present

## 2017-11-22 DIAGNOSIS — G9341 Metabolic encephalopathy: Secondary | ICD-10-CM | POA: Diagnosis not present

## 2017-11-22 DIAGNOSIS — F0391 Unspecified dementia with behavioral disturbance: Secondary | ICD-10-CM | POA: Diagnosis not present

## 2017-11-26 DIAGNOSIS — F319 Bipolar disorder, unspecified: Secondary | ICD-10-CM | POA: Diagnosis not present

## 2017-11-26 DIAGNOSIS — R278 Other lack of coordination: Secondary | ICD-10-CM | POA: Diagnosis not present

## 2017-11-26 DIAGNOSIS — I10 Essential (primary) hypertension: Secondary | ICD-10-CM | POA: Diagnosis not present

## 2017-11-26 DIAGNOSIS — F0391 Unspecified dementia with behavioral disturbance: Secondary | ICD-10-CM | POA: Diagnosis not present

## 2017-12-03 DIAGNOSIS — N390 Urinary tract infection, site not specified: Secondary | ICD-10-CM | POA: Diagnosis not present

## 2017-12-03 DIAGNOSIS — E7800 Pure hypercholesterolemia, unspecified: Secondary | ICD-10-CM | POA: Diagnosis not present

## 2017-12-03 DIAGNOSIS — R5381 Other malaise: Secondary | ICD-10-CM | POA: Diagnosis not present

## 2017-12-03 DIAGNOSIS — F0391 Unspecified dementia with behavioral disturbance: Secondary | ICD-10-CM | POA: Diagnosis not present

## 2017-12-03 DIAGNOSIS — G9340 Encephalopathy, unspecified: Secondary | ICD-10-CM | POA: Diagnosis not present

## 2018-06-15 ENCOUNTER — Emergency Department (HOSPITAL_COMMUNITY): Payer: Medicare Other

## 2018-06-15 ENCOUNTER — Inpatient Hospital Stay (HOSPITAL_COMMUNITY): Payer: Medicare Other

## 2018-06-15 ENCOUNTER — Inpatient Hospital Stay (HOSPITAL_COMMUNITY)
Admission: EM | Admit: 2018-06-15 | Discharge: 2018-06-21 | DRG: 061 | Disposition: E | Payer: Medicare Other | Attending: Neurology | Admitting: Neurology

## 2018-06-15 ENCOUNTER — Other Ambulatory Visit (HOSPITAL_COMMUNITY): Payer: Self-pay

## 2018-06-15 DIAGNOSIS — G935 Compression of brain: Secondary | ICD-10-CM | POA: Diagnosis not present

## 2018-06-15 DIAGNOSIS — R414 Neurologic neglect syndrome: Secondary | ICD-10-CM | POA: Diagnosis not present

## 2018-06-15 DIAGNOSIS — I129 Hypertensive chronic kidney disease with stage 1 through stage 4 chronic kidney disease, or unspecified chronic kidney disease: Secondary | ICD-10-CM | POA: Diagnosis not present

## 2018-06-15 DIAGNOSIS — R402213 Coma scale, best verbal response, none, at hospital admission: Secondary | ICD-10-CM | POA: Diagnosis present

## 2018-06-15 DIAGNOSIS — G8194 Hemiplegia, unspecified affecting left nondominant side: Secondary | ICD-10-CM | POA: Diagnosis not present

## 2018-06-15 DIAGNOSIS — N183 Chronic kidney disease, stage 3 (moderate): Secondary | ICD-10-CM | POA: Diagnosis present

## 2018-06-15 DIAGNOSIS — R29726 NIHSS score 26: Secondary | ICD-10-CM | POA: Diagnosis not present

## 2018-06-15 DIAGNOSIS — I2510 Atherosclerotic heart disease of native coronary artery without angina pectoris: Secondary | ICD-10-CM | POA: Diagnosis not present

## 2018-06-15 DIAGNOSIS — R29810 Facial weakness: Secondary | ICD-10-CM | POA: Diagnosis not present

## 2018-06-15 DIAGNOSIS — Z86711 Personal history of pulmonary embolism: Secondary | ICD-10-CM

## 2018-06-15 DIAGNOSIS — R402113 Coma scale, eyes open, never, at hospital admission: Secondary | ICD-10-CM | POA: Diagnosis not present

## 2018-06-15 DIAGNOSIS — G92 Toxic encephalopathy: Secondary | ICD-10-CM | POA: Diagnosis not present

## 2018-06-15 DIAGNOSIS — Z8679 Personal history of other diseases of the circulatory system: Secondary | ICD-10-CM | POA: Diagnosis not present

## 2018-06-15 DIAGNOSIS — I615 Nontraumatic intracerebral hemorrhage, intraventricular: Secondary | ICD-10-CM | POA: Diagnosis not present

## 2018-06-15 DIAGNOSIS — F0391 Unspecified dementia with behavioral disturbance: Secondary | ICD-10-CM

## 2018-06-15 DIAGNOSIS — K449 Diaphragmatic hernia without obstruction or gangrene: Secondary | ICD-10-CM | POA: Diagnosis present

## 2018-06-15 DIAGNOSIS — Z7189 Other specified counseling: Secondary | ICD-10-CM | POA: Diagnosis not present

## 2018-06-15 DIAGNOSIS — Z823 Family history of stroke: Secondary | ICD-10-CM

## 2018-06-15 DIAGNOSIS — Z955 Presence of coronary angioplasty implant and graft: Secondary | ICD-10-CM

## 2018-06-15 DIAGNOSIS — I161 Hypertensive emergency: Secondary | ICD-10-CM | POA: Diagnosis present

## 2018-06-15 DIAGNOSIS — G936 Cerebral edema: Secondary | ICD-10-CM | POA: Diagnosis not present

## 2018-06-15 DIAGNOSIS — Z888 Allergy status to other drugs, medicaments and biological substances status: Secondary | ICD-10-CM

## 2018-06-15 DIAGNOSIS — F0390 Unspecified dementia without behavioral disturbance: Secondary | ICD-10-CM | POA: Diagnosis present

## 2018-06-15 DIAGNOSIS — R402313 Coma scale, best motor response, none, at hospital admission: Secondary | ICD-10-CM | POA: Diagnosis not present

## 2018-06-15 DIAGNOSIS — Z8249 Family history of ischemic heart disease and other diseases of the circulatory system: Secondary | ICD-10-CM

## 2018-06-15 DIAGNOSIS — I480 Paroxysmal atrial fibrillation: Secondary | ICD-10-CM | POA: Diagnosis present

## 2018-06-15 DIAGNOSIS — I739 Peripheral vascular disease, unspecified: Secondary | ICD-10-CM | POA: Diagnosis present

## 2018-06-15 DIAGNOSIS — I63411 Cerebral infarction due to embolism of right middle cerebral artery: Secondary | ICD-10-CM | POA: Diagnosis not present

## 2018-06-15 DIAGNOSIS — I714 Abdominal aortic aneurysm, without rupture: Secondary | ICD-10-CM | POA: Diagnosis not present

## 2018-06-15 DIAGNOSIS — E785 Hyperlipidemia, unspecified: Secondary | ICD-10-CM | POA: Diagnosis present

## 2018-06-15 DIAGNOSIS — G919 Hydrocephalus, unspecified: Secondary | ICD-10-CM | POA: Diagnosis not present

## 2018-06-15 DIAGNOSIS — W19XXXA Unspecified fall, initial encounter: Secondary | ICD-10-CM | POA: Diagnosis not present

## 2018-06-15 DIAGNOSIS — R1310 Dysphagia, unspecified: Secondary | ICD-10-CM | POA: Diagnosis present

## 2018-06-15 DIAGNOSIS — I6789 Other cerebrovascular disease: Secondary | ICD-10-CM

## 2018-06-15 DIAGNOSIS — Z87891 Personal history of nicotine dependence: Secondary | ICD-10-CM

## 2018-06-15 DIAGNOSIS — Z8349 Family history of other endocrine, nutritional and metabolic diseases: Secondary | ICD-10-CM

## 2018-06-15 DIAGNOSIS — I639 Cerebral infarction, unspecified: Secondary | ICD-10-CM | POA: Diagnosis present

## 2018-06-15 DIAGNOSIS — Z66 Do not resuscitate: Secondary | ICD-10-CM | POA: Diagnosis present

## 2018-06-15 DIAGNOSIS — R471 Dysarthria and anarthria: Secondary | ICD-10-CM | POA: Diagnosis not present

## 2018-06-15 DIAGNOSIS — Z882 Allergy status to sulfonamides status: Secondary | ICD-10-CM

## 2018-06-15 DIAGNOSIS — E669 Obesity, unspecified: Secondary | ICD-10-CM | POA: Diagnosis present

## 2018-06-15 DIAGNOSIS — Z886 Allergy status to analgesic agent status: Secondary | ICD-10-CM

## 2018-06-15 DIAGNOSIS — I618 Other nontraumatic intracerebral hemorrhage: Secondary | ICD-10-CM | POA: Diagnosis present

## 2018-06-15 DIAGNOSIS — R627 Adult failure to thrive: Secondary | ICD-10-CM | POA: Diagnosis present

## 2018-06-15 DIAGNOSIS — Z515 Encounter for palliative care: Secondary | ICD-10-CM | POA: Diagnosis not present

## 2018-06-15 DIAGNOSIS — R4701 Aphasia: Secondary | ICD-10-CM | POA: Diagnosis present

## 2018-06-15 DIAGNOSIS — K219 Gastro-esophageal reflux disease without esophagitis: Secondary | ICD-10-CM | POA: Diagnosis present

## 2018-06-15 DIAGNOSIS — Z79899 Other long term (current) drug therapy: Secondary | ICD-10-CM

## 2018-06-15 DIAGNOSIS — R0603 Acute respiratory distress: Secondary | ICD-10-CM | POA: Diagnosis not present

## 2018-06-15 DIAGNOSIS — Z7401 Bed confinement status: Secondary | ICD-10-CM

## 2018-06-15 DIAGNOSIS — F418 Other specified anxiety disorders: Secondary | ICD-10-CM | POA: Diagnosis present

## 2018-06-15 DIAGNOSIS — Z79891 Long term (current) use of opiate analgesic: Secondary | ICD-10-CM

## 2018-06-15 DIAGNOSIS — Z6835 Body mass index (BMI) 35.0-35.9, adult: Secondary | ICD-10-CM

## 2018-06-15 DIAGNOSIS — J449 Chronic obstructive pulmonary disease, unspecified: Secondary | ICD-10-CM | POA: Diagnosis present

## 2018-06-15 LAB — HEMOGLOBIN A1C
HEMOGLOBIN A1C: 5.7 % — AB (ref 4.8–5.6)
Mean Plasma Glucose: 116.89 mg/dL

## 2018-06-15 LAB — RAPID URINE DRUG SCREEN, HOSP PERFORMED
Amphetamines: NOT DETECTED
BENZODIAZEPINES: POSITIVE — AB
Cocaine: NOT DETECTED
Opiates: POSITIVE — AB
Tetrahydrocannabinol: NOT DETECTED

## 2018-06-15 LAB — DIFFERENTIAL
BASOS PCT: 0 %
Basophils Absolute: 0 10*3/uL (ref 0.0–0.1)
EOS PCT: 1 %
Eosinophils Absolute: 0.1 10*3/uL (ref 0.0–0.7)
Lymphocytes Relative: 28 %
Lymphs Abs: 3 10*3/uL (ref 0.7–4.0)
MONOS PCT: 10 %
Monocytes Absolute: 1 10*3/uL (ref 0.1–1.0)
Neutro Abs: 6.5 10*3/uL (ref 1.7–7.7)
Neutrophils Relative %: 61 %

## 2018-06-15 LAB — LIPID PANEL
CHOL/HDL RATIO: 6.1 ratio
CHOLESTEROL: 230 mg/dL — AB (ref 0–200)
HDL: 38 mg/dL — AB (ref >40)
LDL CALC: UNDETERMINED mg/dL (ref 0–99)
TRIGLYCERIDES: 476 mg/dL — AB (ref <150)
VLDL: UNDETERMINED mg/dL (ref 0–40)

## 2018-06-15 LAB — CBC
HEMATOCRIT: 39.5 % (ref 36.0–46.0)
HEMOGLOBIN: 12.1 g/dL (ref 12.0–15.0)
MCH: 28 pg (ref 26.0–34.0)
MCHC: 30.6 g/dL (ref 30.0–36.0)
MCV: 91.4 fL (ref 78.0–100.0)
PLATELETS: 164 10*3/uL (ref 150–400)
RBC: 4.32 MIL/uL (ref 3.87–5.11)
RDW: 16.8 % — AB (ref 11.5–15.5)
WBC: 10.6 10*3/uL — AB (ref 4.0–10.5)

## 2018-06-15 LAB — COMPREHENSIVE METABOLIC PANEL
ALT: 9 U/L (ref 0–44)
ANION GAP: 10 (ref 5–15)
AST: 12 U/L — ABNORMAL LOW (ref 15–41)
Albumin: 3.4 g/dL — ABNORMAL LOW (ref 3.5–5.0)
Alkaline Phosphatase: 96 U/L (ref 38–126)
BUN: 43 mg/dL — ABNORMAL HIGH (ref 8–23)
CHLORIDE: 104 mmol/L (ref 98–111)
CO2: 25 mmol/L (ref 22–32)
CREATININE: 1.6 mg/dL — AB (ref 0.44–1.00)
Calcium: 8.7 mg/dL — ABNORMAL LOW (ref 8.9–10.3)
GFR, EST AFRICAN AMERICAN: 34 mL/min — AB (ref >60)
GFR, EST NON AFRICAN AMERICAN: 29 mL/min — AB (ref >60)
Glucose, Bld: 102 mg/dL — ABNORMAL HIGH (ref 70–99)
Potassium: 4 mmol/L (ref 3.5–5.1)
Sodium: 139 mmol/L (ref 135–145)
Total Bilirubin: 0.6 mg/dL (ref 0.3–1.2)
Total Protein: 7 g/dL (ref 6.5–8.1)

## 2018-06-15 LAB — I-STAT CHEM 8, ED
BUN: 37 mg/dL — AB (ref 8–23)
CALCIUM ION: 1.15 mmol/L (ref 1.15–1.40)
CHLORIDE: 105 mmol/L (ref 98–111)
Creatinine, Ser: 1.5 mg/dL — ABNORMAL HIGH (ref 0.44–1.00)
GLUCOSE: 96 mg/dL (ref 70–99)
HCT: 32 % — ABNORMAL LOW (ref 36.0–46.0)
Hemoglobin: 10.9 g/dL — ABNORMAL LOW (ref 12.0–15.0)
Potassium: 3.9 mmol/L (ref 3.5–5.1)
Sodium: 141 mmol/L (ref 135–145)
TCO2: 23 mmol/L (ref 22–32)

## 2018-06-15 LAB — CBG MONITORING, ED: GLUCOSE-CAPILLARY: 89 mg/dL (ref 70–99)

## 2018-06-15 LAB — URINALYSIS, ROUTINE W REFLEX MICROSCOPIC
Bilirubin Urine: NEGATIVE
Glucose, UA: NEGATIVE mg/dL
HGB URINE DIPSTICK: NEGATIVE
Ketones, ur: NEGATIVE mg/dL
Leukocytes, UA: NEGATIVE
Nitrite: NEGATIVE
Protein, ur: NEGATIVE mg/dL
SPECIFIC GRAVITY, URINE: 1.014 (ref 1.005–1.030)
pH: 5 (ref 5.0–8.0)

## 2018-06-15 LAB — TYPE AND SCREEN
ABO/RH(D): O POS
Antibody Screen: NEGATIVE

## 2018-06-15 LAB — PROTIME-INR
INR: 1.06
Prothrombin Time: 13.7 seconds (ref 11.4–15.2)

## 2018-06-15 LAB — I-STAT TROPONIN, ED: TROPONIN I, POC: 0.01 ng/mL (ref 0.00–0.08)

## 2018-06-15 LAB — ECHOCARDIOGRAM COMPLETE: Weight: 3213.42 oz

## 2018-06-15 LAB — FIBRINOGEN: Fibrinogen: 282 mg/dL (ref 210–475)

## 2018-06-15 LAB — ETHANOL: Alcohol, Ethyl (B): <10 mg/dL (ref <10)

## 2018-06-15 LAB — APTT: aPTT: 31 seconds (ref 24–36)

## 2018-06-15 LAB — MRSA PCR SCREENING: MRSA BY PCR: NEGATIVE

## 2018-06-15 MED ORDER — CLEVIDIPINE BUTYRATE 0.5 MG/ML IV EMUL
0.0000 mg/h | INTRAVENOUS | Status: DC
  Administered 2018-06-15: 14 mg/h via INTRAVENOUS
  Administered 2018-06-15: 21 mg/h via INTRAVENOUS
  Administered 2018-06-15: 8 mg/h via INTRAVENOUS
  Filled 2018-06-15 (×4): qty 50

## 2018-06-15 MED ORDER — STROKE: EARLY STAGES OF RECOVERY BOOK
Freq: Once | Status: AC
  Administered 2018-06-15: 05:00:00
  Filled 2018-06-15: qty 1

## 2018-06-15 MED ORDER — SODIUM CHLORIDE 0.9 % IV SOLN: 50.0000 mL | Freq: Once | INTRAVENOUS | Status: DC

## 2018-06-15 MED ORDER — HYDRALAZINE HCL 20 MG/ML IJ SOLN
20.0000 mg | Freq: Four times a day (QID) | INTRAMUSCULAR | Status: DC | PRN
  Administered 2018-06-15: 20 mg via INTRAVENOUS
  Filled 2018-06-15: qty 1

## 2018-06-15 MED ORDER — ORAL CARE MOUTH RINSE
15.0000 mL | Freq: Two times a day (BID) | OROMUCOSAL | Status: DC
  Administered 2018-06-16 – 2018-06-17 (×3): 15 mL via OROMUCOSAL

## 2018-06-15 MED ORDER — CHLORHEXIDINE GLUCONATE 0.12 % MT SOLN
15.0000 mL | Freq: Two times a day (BID) | OROMUCOSAL | Status: DC
  Administered 2018-06-15 – 2018-06-17 (×3): 15 mL via OROMUCOSAL

## 2018-06-15 MED ORDER — SODIUM CHLORIDE 0.9 % IV SOLN
INTRAVENOUS | Status: DC
  Administered 2018-06-15 – 2018-06-16 (×3): via INTRAVENOUS

## 2018-06-15 MED ORDER — CLEVIDIPINE BUTYRATE 0.5 MG/ML IV EMUL
5.0000 mg/h | INTRAVENOUS | Status: DC
  Administered 2018-06-15: 6 mg/h via INTRAVENOUS
  Administered 2018-06-15: 5 mg/h via INTRAVENOUS
  Filled 2018-06-15 (×2): qty 50
  Filled 2018-06-15: qty 100

## 2018-06-15 MED ORDER — ALTEPLASE (STROKE) FULL DOSE INFUSION
75.7000 mg | Freq: Once | INTRAVENOUS | Status: AC
  Administered 2018-06-15: 75.7 mg via INTRAVENOUS

## 2018-06-15 MED ORDER — IOPAMIDOL (ISOVUE-370) INJECTION 76%
150.0000 mL | Freq: Once | INTRAVENOUS | Status: AC | PRN
  Administered 2018-06-15: 40 mL via INTRAVENOUS

## 2018-06-15 MED ORDER — ACETAMINOPHEN 325 MG PO TABS: 650.0000 mg | ORAL_TABLET | ORAL | Status: DC | PRN

## 2018-06-15 MED ORDER — TRANEXAMIC ACID 1000 MG/10ML IV SOLN
1000.0000 mg | INTRAVENOUS | Status: AC
  Administered 2018-06-15: 1000 mg via INTRAVENOUS
  Filled 2018-06-15: qty 10

## 2018-06-15 MED ORDER — ACETAMINOPHEN 650 MG RE SUPP: 650.0000 mg | RECTAL | Status: DC | PRN

## 2018-06-15 MED ORDER — IOPAMIDOL (ISOVUE-370) INJECTION 76%
100.0000 mL | Freq: Once | INTRAVENOUS | Status: AC | PRN
  Administered 2018-06-15: 100 mL via INTRAVENOUS

## 2018-06-15 MED ORDER — SENNOSIDES-DOCUSATE SODIUM 8.6-50 MG PO TABS: 1.0000 | ORAL_TABLET | Freq: Every evening | ORAL | Status: DC | PRN

## 2018-06-15 MED ORDER — SODIUM CHLORIDE 0.9% IV SOLUTION
Freq: Once | INTRAVENOUS | Status: AC
  Administered 2018-06-15: 1 mL via INTRAVENOUS

## 2018-06-15 MED ORDER — ACETAMINOPHEN 160 MG/5ML PO SOLN: 650.0000 mg | ORAL | Status: DC | PRN

## 2018-06-15 MED ORDER — CLEVIDIPINE BUTYRATE 0.5 MG/ML IV EMUL
0.0000 mg/h | INTRAVENOUS | Status: DC
  Administered 2018-06-15 – 2018-06-16 (×7): 21 mg/h via INTRAVENOUS
  Filled 2018-06-15 (×7): qty 50

## 2018-06-15 MED ORDER — PERFLUTREN LIPID MICROSPHERE
3.0000 mL | INTRAVENOUS | Status: AC | PRN
Start: 2018-06-15 — End: 2018-06-15
  Administered 2018-06-15: 3 mL via INTRAVENOUS
  Filled 2018-06-15: qty 4

## 2018-06-15 MED ORDER — ALTEPLASE 100 MG IV SOLR
INTRAVENOUS | Status: AC
  Filled 2018-06-15: qty 100

## 2018-06-15 NOTE — Progress Notes (Signed)
Verified BP parameters with Dr.Arora. SBP <140, start cleviprex per order.

## 2018-06-15 NOTE — Progress Notes (Signed)
Code stroke  Call Time  145am Beeper   No beeper  Exam started  150 Exam finished  460 CGB   847 JGYL   694 [id number]  [id number]

## 2018-06-15 NOTE — H&P (Addendum)
STROKE H&P CC: Stroke transfer from [facility] s/p tPA  History is obtained from:Chart  HPI: [name] [name] is a 83 y.o. female  Past medical history of AAA, coronary artery disease,?  Atrial fibrillation/PSVT, COPD, dementia, anxiety and depression, hyperlipidemia, pituitary microadenoma status post transsphenoidal resection 12/31/2006, history of pulmonary embolism, peripheral vascular disease, presented to [facility] from a nursing facility with a last known normal of 11:45 PM on 06/14/2018, within window for IV TPA with left-sided weakness.  She was evaluated by the emergency room physicians and telemedicine neurology specialist, And found to have a NIH stroke scale of 26 consistent with right MCA territory stroke.  Noncontrast CT of the head did not show any evidence of bleed but showed a possible dense right MCA and patchy areas of evolving right MCA infarct with an aspect score of 7.  Per my conversation with the telemedicine neurologist and review of notes, extensive discussion with patient's daughter about the risks and benefits of IV TPA were held and finally IV TPA was recommended and ordered. Patient was then transferred to [facility] for further management and for consideration for endovascular treatment. When I received a call for transfer, I had specifically asked about the modified Rankin score which was 4-5, and precluded the patient from any intervention. Patient arrived at [facility], and I met them on route to the neuro ICU in the emergency room.  Patient had been complaining of headache on the way.  She was taken in for a stat noncontrast CT of the head as well as a CTA.  Noncontrast CT of the head showed large left-sided intraparenchymal hematoma.  Approximately 34 cc. The CT angios head and neck showed possible right M3 occlusion. CT angios chest abdomen pelvis for dissection was also done that did not show any acute intrathoracic, abdominal pelvic pathology or  dissection.  Extensive atherosclerotic calcification of the thoracic and abdominal aorta was seen. Noncalcified plaque or mural thrombus along the posterior wall of the descending thoracic aorta with aneurysmal dilatation of proximal segment of the descending thoracic aorta measuring up to 4.8 cm seen.  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  Fusiform infrarenal abdominal aortic aneurysm with large peripheral intramural clot measuring 6.8 cm, slightly increased in size to the prior CT.  No evidence of leak or rupture.   LKW: 11:45 PM on 06/14/2018 tpa given?:  [facility] Premorbid modified Rankin scale (mRS):4 ICH Score: 3 (1 point each for GCS, age and ICH volume) (GCS- E3. V4, M5 =12)  ROS: unable to obtain due to altered mental status.   Past Medical History:  Diagnosis Date  . AAA (abdominal aortic aneurysm) ([place])    infrarenal; 07/2012: Diameter of 5.4 cm  . AAA (abdominal aortic aneurysm) ([place])   . Anemia   . Arteriosclerotic cardiovascular disease (ASCVD)    Cardiac cath in 03/2000->  80% LAD, 70-80% RCA;  stent x1 in the LAD and x2 in the RCA with excellent angiographic outcome; repeat catheterization in late 2001 and 2003 revealed no restenosis; EF-50%  . Atrial arrhythmia    Atrial fibrillation and atrial flutter diagnosed in the past;?  PSVT  . Chronic kidney disease (CKD) stage G3a/A1, moderately decreased glomerular filtration rate (GFR) between 45-59 mL/min/1.73 square meter and albuminuria creatinine ratio less than 30 mg/g 05/25/2013  . COPD (chronic obstructive pulmonary disease) ([place])    multiple hospitalizations for exacerbations with acute  bronchitis  . Coronary artery disease   . Degenerative joint disease    Bilateral hips  . Dementia   . Depression with anxiety   . DJD (degenerative joint disease)   . Encephalopathy   . Gall stones   . GERD (gastroesophageal reflux disease)     Hiatal hernia  . Hyperlipidemia   . Hyperlipidemia   . Hypernatremia   . Hypertension   . Hypokalemia   . Ileus ([place])   . Nephrolithiasis   . Pituitary microadenoma ([place])    transsphenoidal resection in 09/2007  . Pulmonary embolism ([place]) 08/14/2012  . PVD (peripheral vascular disease) ([place])   . Renal failure   . SBO (small bowel obstruction)   . Tobacco abuse, in remission    Quit in 2006; total consumption of 50-75 pack years    Family History  Problem Relation Age of Onset  . Heart disease Mother   . Hyperlipidemia Mother   . Hypertension Mother   . Stroke Mother   . Cancer Brother     Social History:   reports that she quit smoking about 13 years ago. Her smoking use included cigarettes. She has a 75.00 pack-year smoking history. She quit smokeless tobacco use about 10 years ago. Her smokeless tobacco use included snuff. She reports that she does not drink alcohol or use drugs.  Medications  Current Facility-Administered Medications:  .   stroke: mapping our early stages of recovery book, , Does not apply, Once, Amie Portland, MD .  alteplase (ACTIVASE) 1 mg/mL infusion 75.7 mg, 75.7 mg, Intravenous, Once, Last Rate: 75.7 mL/hr at 06/03/2018 0237, 75.7 mg at 05/31/2018 0237 **FOLLOWED BY** 0.9 %  sodium chloride infusion, 50 mL, Intravenous, Once, Rancour, Stephen, MD .  0.9 %  sodium chloride infusion, , Intravenous, Continuous, Amie Portland, MD .  acetaminophen (TYLENOL) tablet 650 mg, 650 mg, Oral, Q4H PRN **OR** acetaminophen (TYLENOL) solution 650 mg, 650 mg, Per Tube, Q4H PRN **OR** acetaminophen (TYLENOL) suppository 650 mg, 650 mg, Rectal, Q4H PRN, Amie Portland, MD .  iopamidol (ISOVUE-370) 76 % injection 150 mL, 150 mL, Intravenous, Once PRN, Rancour, Stephen, MD .  senna-docusate (Senokot-S) tablet 1 tablet, 1 tablet, Oral, QHS PRN, Amie Portland, MD  Exam: Current vital signs: BP 133/73   Pulse (!) 51   Temp (!) 97 F (36.1 C) (Axillary)   Resp 12   Wt 93.4  kg (205 lb 14.6 oz)   SpO2 94%   BMI 36.48 kg/m  Vital signs in last 24 hours: Temp:  [97 F (36.1 C)] 97 F (36.1 C) (06/25 0242) Pulse Rate:  [49-58] 51 (06/25 0245) Resp:  [10-17] 12 (06/25 0245) BP: (118-142)/(65-82) 133/73 (06/25 0245) SpO2:  [94 %-96 %] 94 % (06/25 0245) Weight:  [93.4 kg (205 lb 14.6 oz)] 93.4 kg (205 lb 14.6 oz) (06/25 0232) General: Patient's drowsy, did not open eyes to voice but did respond as below.  No apparent distress. HEENT: Normocephalic, atraumatic Lungs: Clear to auscultation Cardiovascular: S1-S2 heard, regular rate rhythm Extremities: Warm well perfused with intact pulses Neurological exam Patient's drowsy.  Did not open eyes to voice but did repeat sentences when asked to.  She did follow some commands although inconsistently.  Her speech was dysarthric.  Poor attention concentration.  Could not reliably assess naming. Cranial nerves: Pupils are equal, 2 mm, reactive to light.  Did not blink to threat from either side.  Did not cooperate with extraocular movement exam, left lower facial weakness. Motor exam: Flaccid  left upper and lower extremity.  Spontaneously moving right upper extremity but would not hold up against gravity.  Good grip strength in the right upper extremity.   did not spontaneously move the right lower extremity but did withdraw some to noxious stimulus. Sensory exam: Grossly diminished sensation on the left side. Did not perform cerebellar tasks or gait.  NIHSS 1a Level of Conscious.: 1 1b LOC Questions: 2 1c LOC Commands: 1 2 Best Gaze: 0 3 Visual: 2 4 Facial Palsy: 1 5a Motor Arm - left: 4 5b Motor Arm - Right: 1 6a Motor Leg - Left: 4 6b Motor Leg - Right: 2 7 Limb Ataxia: 0 8 Sensory: 2 9 Best Language: 1 10 Dysarthria: 1 11 Extinct. and Inatten.: 2 TOTAL: 24  Labs I have reviewed labs in epic and the results pertinent to this consultation are:  CBC    Component Value Date/Time   WBC 10.6 (H) 06/13/2018  0210   RBC 4.32 06/14/2018 0210   HGB 10.9 (L) 06/03/2018 0223   HCT 32.0 (L) 06/10/2018 0223   PLT 164 06/14/2018 0210   MCV 91.4 05/28/2018 0210   MCH 28.0 05/29/2018 0210   MCHC 30.6 05/26/2018 0210   RDW 16.8 (H) 05/28/2018 0210   LYMPHSABS 3.0 06/03/2018 0210   MONOABS 1.0 06/08/2018 0210   EOSABS 0.1 06/03/2018 0210   BASOSABS 0.0 06/01/2018 0210    CMP     Component Value Date/Time   NA 141 05/30/2018 0223   K 3.9 05/28/2018 0223   CL 105 06/16/2018 0223   CO2 25 06/02/2018 0210   GLUCOSE 96 06/18/2018 0223   BUN 37 (H) 06/10/2018 0223   CREATININE 1.50 (H) 05/25/2018 0223   CREATININE 1.24 (H) 06/11/2015 1554   CALCIUM 8.7 (L) 05/23/2018 0210   PROT 7.0 05/30/2018 0210   ALBUMIN 3.4 (L) 05/23/2018 0210   AST 12 (L) 06/02/2018 0210   ALT 9 05/29/2018 0210   ALKPHOS 96 05/29/2018 0210   BILITOT 0.6 06/12/2018 0210   GFRNONAA 29 (L) 06/14/2018 0210   GFRAA 34 (L) 06/09/2018 0210    Lipid Panel     Component Value Date/Time   CHOL 161 06/11/2015 1554   TRIG 103 06/11/2015 1554   HDL 62 06/11/2015 1554   CHOLHDL 2.6 06/11/2015 1554   VLDL 21 06/11/2015 1554   LDLCALC 78 06/11/2015 1554   Imaging I have reviewed the images obtained: CT-scan of the brain-possible dense rt m1. ASPECTS 6 due to hypodensities in the right MCA territory. Question LSN time based on extent of hypodensity. Repeat head CT done on arrival at [facility] showed greater than 30 cc ICH in the left occipital lobe along with evolving hypodensities in the right MCA territory.  CT angios head and neck showed right M3 occlusion.  Bilateral carotid bifurcations with 70% stenosis on the right and 50% stenosis on the left.  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  Incidental finding left upper lobe pulmonary nodules measuring up to 6 mm   Assessment:  83 year old woman with above said past medical history with acute onset of left-sided weakness and symptoms consistent with right MCA stroke  brought in for evaluation at [facility], ED. Evaluation by ER physician and telemedicine neurologist confirmed findings of right MCA stroke.  Discussion with family conducted by telemedicine neurologist and IV TPA administered.  Patient then transferred to [facility] for further evaluation and post TPA care. Patient started complaining of headache on the way  to [facility].  Repeat CT head showed IPH in the left parietal occipital area more than 30 cc with an ICH score of 3. Patient not a candidate for intervention due to poor baseline modified Rankin.  I immediately initiated TPA reversal - cryo and TXA ordered.  Impression: Right MCA territory acute ischemic stroke status post IV TPA Interval development of left parietal-occipital ICH  Recommendations:  Acute Ischemic Stroke Cerebral infarction due to embolism of right middle cerebral artery  Left parieto-occipital ICH ICH score 3  Acuity: Acute Current Suspected Etiology: Continue Evaluation:  -Admit to: NICU -Hold Aspirin until 24 hour post tPA neuroimaging is stable and without evidence of bleeding -Blood pressure control, goal of SYS <140 due to [place] -MRI/ECHO/A1C/Lipid panel. -Hyperglycemia management per SSI to maintain glucose 140-167m/dL. -PT/OT/ST therapies and recommendations when able  TPA reversal -Cryo -TXA  CNS  -Close neuro monitoring  Dysarthria Dysphagia following cerebral infarction  -NPO until cleared by speech -ST -May need PEG  Hemiplegia and hemiparesis following cerebral infarction affecting right dominant side Hemiplegia and hemiparesis following cerebral infarction affecting left non-dominant side  -PT/OT -PM&R consult  Toxic encephalopathy -Correct metabolic causes -Monitor  RESP No acute respiratory issues Monitor closely clinically. May need intubation.  CV Essential (primary) hypertension Hypertensive Emergency -Aggressive BP control, goal SBP < 140 -Use  Cleviprex -TTE  Hyperlipidemia, unspecified  - Statin for goal LDL < 70  ?Paroxysmal atrial fibrillation -Rate control -Continue cardiac monitoring -Not a candidate for anticoagulation due to IPH right now  HEME -Monitor -transfuse for hgb < 7  ENDO -goal HgbA1c < 7  GI/GU -Gentle hydration  AAA -CTA of the chest abdomen pelvis did not show any evidence of dissection or rupture/leak of the AAA's.  Monitor per primary.  Fluid/Electrolyte Disorders -Replete -Repeat labs  ID Possible Aspiration PNA -CXR -NPO -Monitor  Possible UTI -Foley catheter present on arrival -CX pending  Prophylaxis DVT:   scd GI: na Bowel: doc/senna   Diet: NPO until cleared by speech  Code Status: Full Code  POA ICH AIS Foley Possible aspiration  -- AAmie Portland MD [facility] Pager: [phone] 7pm to 7am, please call on call as listed on AMION.   CRITICAL CARE ATTESTATION This patient is critically ill and at significant risk of neurological worsening, death and care requires constant monitoring of vital signs, hemodynamics,respiratory and cardiac monitoring. I spent 65  minutes of neurocritical care time performing neurological assessment, discussion with family, other specialists and medical decision making of high complexityin the care of  this patient.

## 2018-06-15 NOTE — Progress Notes (Signed)
  Echocardiogram 2D Echocardiogram has been performed.  [name] [name] 05/28/2018, 12:59 PM

## 2018-06-15 NOTE — ED Provider Notes (Signed)
[facility] EMERGENCY DEPARTMENT Provider Note   CSN: [id number] Arrival date & time: 06/03/2018  0147     History   Chief Complaint Chief Complaint  Patient presents with  . Code Stroke    HPI [name] [name] is a 83 y.o. female.  Level 5 caveat for altered mental status.  Patient from nursing facility with left-sided weakness.  Last seen normal was 11:45 PM.  Discussed with patient's caregiver Suanne Marker at her [facility].  They state patient had a fall around 2245 and was brought back to bed without any neuro deficits.  They checked on her just before midnight and found her to be weak on the left side with facial droop and dysarthria.  EMS was called.  Patient is bedbound at baseline but is normally able to communicate and move all of her extremities.  She does not take any blood thinners.  The history is provided by the patient, the EMS personnel and the nursing home. The history is limited by the condition of the patient.    Past Medical History:  Diagnosis Date  . AAA (abdominal aortic aneurysm) ([place])    infrarenal; 07/2012: Diameter of 5.4 cm  . AAA (abdominal aortic aneurysm) ([place])   . Anemia   . Arteriosclerotic cardiovascular disease (ASCVD)    Cardiac cath in 03/2000->  80% LAD, 70-80% RCA;  stent x1 in the LAD and x2 in the RCA with excellent angiographic outcome; repeat catheterization in late 2001 and 2003 revealed no restenosis; EF-50%  . Atrial arrhythmia    Atrial fibrillation and atrial flutter diagnosed in the past;?  PSVT  . Chronic kidney disease (CKD) stage G3a/A1, moderately decreased glomerular filtration rate (GFR) between 45-59 mL/min/1.73 square meter and albuminuria creatinine ratio less than 30 mg/g 05/25/2013  . COPD (chronic obstructive pulmonary disease) ([place])    multiple hospitalizations for exacerbations with acute bronchitis  . Coronary artery disease   . Degenerative joint disease    Bilateral hips  . Dementia   . Depression with  anxiety   . DJD (degenerative joint disease)   . Encephalopathy   . Gall stones   . GERD (gastroesophageal reflux disease)    Hiatal hernia  . Hyperlipidemia   . Hyperlipidemia   . Hypernatremia   . Hypertension   . Hypokalemia   . Ileus ([place])   . Nephrolithiasis   . Pituitary microadenoma ([place])    transsphenoidal resection in 09/2007  . Pulmonary embolism ([place]) 08/14/2012  . PVD (peripheral vascular disease) ([place])   . Renal failure   . SBO (small bowel obstruction)   . Tobacco abuse, in remission    Quit in 2006; total consumption of 50-75 pack years    Patient Active Problem List   Diagnosis Date Noted  . Dementia with behavioral disturbance 10/01/2016  . Delirium 10/01/2016  . Visual hallucinations 10/01/2016  . Chronic anxiety 10/01/2016  . Abdominal aneurysm without mention of rupture 06/01/2014  . Abdominal pain, left lower quadrant 06/01/2014  . Failure to thrive 02/02/2014  . Generalized weakness 02/02/2014  . Gait difficulty 02/02/2014  . Confusion 02/02/2014  . Low back pain 02/02/2014  . Chest pressure 08/10/2013  . Abdominal pressure 08/10/2013  . Appendicitis, acute 05/28/2013  . Bradycardia 05/28/2013  . Chronic kidney disease (CKD) stage G3a/A1, moderately decreased glomerular filtration rate (GFR) between 45-59 mL/min/1.73 square meter and albuminuria creatinine ratio less than 30 mg/g ([facility]) 05/25/2013  . Abdominal pain 05/20/2013  . Nausea and vomiting 05/20/2013  .  UTI (urinary tract infection) 05/20/2013  . Renal insufficiency 05/20/2013  . AAA (abdominal aortic aneurysm) without rupture ([place]) 02/09/2013  . RUQ abdominal pain 12/28/2012  . Failure to thrive in adult 12/28/2012  . Dehydration 12/28/2012  . Hypokalemia 12/28/2012  . Small bowel obstruction ([place]) 09/02/2012  . Anemia 09/01/2012  . Peripheral vascular disease ([place]) 09/01/2012  . Essential hypertension   . COPD (chronic obstructive pulmonary disease) ([place])   . Atrial arrhythmia   .  AAA (abdominal aortic aneurysm) ([place])   . Arteriosclerotic cardiovascular disease (ASCVD)   . Tobacco abuse, in remission   . Pituitary microadenoma ([place])   . Acute encephalopathy 08/14/2012  . Pulmonary embolism ([place]) 08/14/2012  . Hyperlipidemia 05/24/2012  . ARTHRITIS, HIPS, BILATERAL 06/06/2010    Past Surgical History:  Procedure Laterality Date  . APPENDECTOMY N/A 05/25/2013   Procedure: APPENDECTOMY;  Surgeon: Donato Heinz, MD;  Location: [facility];  Service: General;  Laterality: N/A;  . CARDIAC CATHETERIZATION  2001   post PTCA and stenting of her LAD and RCA  . KIDNEY STONE SURGERY    . TRANSPHENOIDAL / TRANSNASAL HYPOPHYSECTOMY / RESECTION PITUITARY TUMOR  09/2007     OB History   None      Home Medications    Prior to Admission medications   Medication Sig Start Date End Date Taking? Authorizing Provider  acetaminophen (TYLENOL) 500 MG tablet Take 500 mg by mouth every 6 (six) hours as needed for mild pain.    [provider]  ALPRAZolam Duanne Moron) 0.25 MG tablet Take 0.25 mg by mouth 4 (four) times daily.     [provider]  cefUROXime (CEFTIN) 250 MG tablet Take 1 tablet (250 mg total) by mouth 2 (two) times daily with a meal. 10/03/16   Sinda Du, MD  divalproex (DEPAKOTE) 125 MG DR tablet Take 125 mg by mouth 2 (two) times daily.    [provider]  furosemide (LASIX) 40 MG tablet Take 40 mg by mouth every morning.    [provider]  INCRUSE ELLIPTA 62.5 MCG/INH AEPB  05/14/15   [provider]  Lactobacillus (ACIDOPHILUS EXTRA STRENGTH PO) Take 1 tablet by mouth 2 (two) times daily.    [provider]  nebivolol (BYSTOLIC) 5 MG tablet Take 5 mg by mouth daily.    [provider]  omeprazole (PRILOSEC) 20 MG capsule Take 20 mg by mouth 2 (two) times daily before a meal.     [provider]  risperiDONE (RISPERDAL) 0.25 MG tablet Take 1 tablet (0.25 mg total) by mouth 2 (two) times daily.  10/06/16   Sinda Du, MD  traMADol (ULTRAM) 50 MG tablet Take 50 mg by mouth 2 (two) times daily.    [provider]  traZODone (DESYREL) 50 MG tablet Take 1 tablet (50 mg total) by mouth at bedtime as needed for sleep (insomnia). Patient taking differently: Take 50 mg by mouth at bedtime.  05/31/13   Sinda Du, MD    Family History Family History  Problem Relation Age of Onset  . Heart disease Mother   . Hyperlipidemia Mother   . Hypertension Mother   . Stroke Mother   . Cancer Brother     Social History Social History   Tobacco Use  . Smoking status: Former Smoker    Packs/day: 1.50    Years: 50.00    Pack years: 75.00    Types: Cigarettes    Last attempt to quit: 05/27/2005    Years  since quitting: 13.0  . Smokeless tobacco: Former Systems developer    Types: Snuff    Quit date: 01/11/2008  Substance Use Topics  . Alcohol use: No    Alcohol/week: 0.0 oz  . Drug use: No     Allergies   Zoloft [sertraline hcl]; Asa [aspirin]; Lidocaine; Sulfonamide derivatives; and Ativan [lorazepam]   Review of Systems Review of Systems  Unable to perform ROS: Acuity of condition     Physical Exam Updated Vital Signs BP 118/74   Pulse (!) 55   Temp (!) 96.7 F (35.9 C) (Axillary)   Resp 16   Wt 91.1 kg (200 lb 13.4 oz)   SpO2 100%   BMI 35.58 kg/m   Physical Exam  Constitutional: She appears well-developed and well-nourished. No distress.  Patient is somnolent, arouses to voice, opens eyes, follows some commands with right upper extremity  HENT:  Head: Normocephalic and atraumatic.  Right Ear: External ear normal.  Left Ear: External ear normal.  Eyes: Pupils are equal, round, and reactive to light. EOM are normal.  Neck: Normal range of motion. Neck supple.  Cardiovascular: Normal rate, regular rhythm and normal heart sounds.  Intact femoral pulses and DP pulses.  Pulmonary/Chest: Effort normal. No respiratory distress.  Abdominal: Soft. She exhibits no  mass. There is tenderness. There is no rebound.  Musculoskeletal: Normal range of motion. She exhibits no edema, tenderness or deformity.  Neurological: She is alert. A cranial nerve deficit is present. Coordination abnormal.   Left-sided facial droop, significant dysarthria, unable to hold up left arm or leg against gravity.  Patient able to hold right arm up against gravity with intact grip strength.  Unable to lift right leg off bed.  Left-sided neglect with significant dysarthria.   Skin: Capillary refill takes less than 2 seconds.     ED Treatments / Results  Labs (all labs ordered are listed, but only abnormal results are displayed) Labs Reviewed  CBC - Abnormal; Notable for the following components:      Result Value   WBC 10.6 (*)    RDW 16.8 (*)    All other components within normal limits  DIFFERENTIAL  ETHANOL  PROTIME-INR  APTT  COMPREHENSIVE METABOLIC PANEL  RAPID URINE DRUG SCREEN, HOSP PERFORMED  URINALYSIS, ROUTINE W REFLEX MICROSCOPIC  CBG MONITORING, ED  I-STAT CHEM 8, ED  I-STAT TROPONIN, ED    EKG EKG Interpretation  Date/Time:  Tuesday June 15 2018 02:03:19 EDT Ventricular Rate:  55 PR Interval:    QRS Duration: 143 QT Interval:  453 QTC Calculation: 434 R Axis:   130 Text Interpretation:  Sinus rhythm Right bundle branch block Probable anteroseptal infarct, old No significant change was found Confirmed by Ezequiel Essex [id number]) on 06/18/2018 2:22:03 AM   Radiology No results found.  Procedures Procedures (including critical care time)  Medications Ordered in ED Medications  alteplase (ACTIVASE) 1 mg/mL injection (has no administration in time range)  alteplase (ACTIVASE) 1 mg/mL infusion 0.9 mg/kg (has no administration in time range)    Followed by  0.9 %  sodium chloride infusion (has no administration in time range)     Initial Impression / Assessment and Plan / ED Course  I have reviewed the triage vital signs and the nursing  notes.  Pertinent labs & imaging results that were available during my care of the patient were reviewed by me and considered in my medical decision making (see chart for details).    Patient with left-sided weakness, last  seen normal was  11:45 PM.  This was discussed with patient's caregiver Suanne Marker.  Also Discussed with patient's daughter [name].  CT scan is negative.  But does show possible M1 occlusion.  No hemorrhage.  NIH stroke scale is 26.  Tele-neurology consult completed and TPA recommended. No absolute contraindications identified.  Risks and benefits of TPA discussed with patient's daughter and she is in agreement to proceed. Dr. Reggy Eye of teleneurology discussed risks and benefits of tPA as well and daugther agrees to proceed.  Daughter is aware the patient's history of aortic aneurysm increases her risk of bleeding.  CT scan shows M1 occlusion.  Discussed with Dr. Malen Gauze of neurology at [facility]. He agrees with ICU admission.  States to hold on CTA scans until patient arrives at [facility].  We will also obtain CT angiogram of abdomen given patient's history of aortic aneurysm.  Patient will also need perfusion CT scan when she gets to [facility].  He feels that patient should be transferred to [facility] for CTA scans. He does not feel patient is a good endovascular candidate.  Patient protecting airway at time of transfer.  Blood pressure 140/70. Patient transferred to [facility] for neurology evaluation and CTA imaging and possible intervention.  CRITICAL CARE Performed by: Ezequiel Essex Total critical care time: 45 minutes Critical care time was exclusive of separately billable procedures and treating other patients. Critical care was necessary to treat or prevent imminent or life-threatening deterioration. Critical care was time spent personally by me on the following activities: development of treatment plan with patient and/or surrogate as well as nursing, discussions  with consultants, evaluation of patient's response to treatment, examination of patient, obtaining history from patient or surrogate, ordering and performing treatments and interventions, ordering and review of laboratory studies, ordering and review of radiographic studies, pulse oximetry and re-evaluation of patient's condition.   Final Clinical Impressions(s) / ED Diagnoses   Final diagnoses:  Acute ischemic stroke [facility])    ED Discharge Orders    None       Ezequiel Essex, MD 05/26/2018 [phone]

## 2018-06-15 NOTE — ED Notes (Signed)
Report given to Amy RN.

## 2018-06-15 NOTE — Consult Note (Signed)
[facility] TeleNeurology Consult Services   Impression: Stroke   Alteplase is indicated. No absolute contraindications are present.   ----*Presentation is suggestive of Large Vessel Occlusive Disease. Advanced imaging recommended.   Patient was transferred and accepted, on the way she started complaining of headache, repeat CT head in other hospital showed large left side IPH. Spoke with Dr. Rory Percy neurologist and he ordered TAP reversal and patient will be admitted to the ICU, will discuss with family.    Metrics:  Door time: 155 [facility] contacted: 154 [facility] at bedside: 157 Last known well (LKW): 1145 NIHSS assessment time (start of exam): 200 Verbal tpa order: 205 Needle time: 226  Verbal Consent to tPA:  I have explained to the patient/family/guardian the nature of the patient's condition, the use of tPA fibrinolytic agent, and the benefits to be reasonably expected compared with alternative approaches. I have discussed the likelihood of major risks or complications of this procedure including (if applicable) but not limited to loss of limb function, brain damage, paralysis, hemorrhage, infection, complications from transfusion of blood components, drug reactions, blood clots and loss of life. I have also indicated that with any procedure there is always the possibility of an unexpected complication.  I discussed potential treatment with tPA including a 30% chance of improvement but a 6% risk of bleeding.   I have explained the risks which include:    1. Death, Stroke or permanent neurologic injury (paralysis, coma, etc)   2. Worsening of stroke symptoms from swelling or bleeding in the brain   3. Bleeding in other parts of the body   4. Need for blood transfusions to replace blood or clotting factors   5. Allergic reaction to medications   6. Other unexpected complications    All questions were answered and the patient/family/guardian express understanding  of the treatment plan and consent to the procedure.  Our recommendations are outlined below.   We will be seeing the patient back in follow up as noted.    Recommendations:   IV tPA - dose = 8.4 mg bolus, and 75.7 infusion Routine post tPA monitoring including neuro checks and blood pressure control during/after treatment Monitor blood pressure Check blood pressure and NIHSS every 15 min for 2 h, then every 30 min for 6 h, and finally every hour for 16 h  Systolic greater than 332 OR diastolic greater than 951: Option 1: Labetalol 10 mg IV for 1 - 2 min May repeat or double labetalol every 10 min to maximum dose of 300 mg, or give initial labetalol dose, then start labetalol drip at 2 - 8 mg/min. Option 2: Nicardipine 5 mg/h IV infusion as initial dose and titrate to desired effect by increasing 2.5 mg/h every 5 min to maximum of 15 mg/h;  If blood pressure is not controlled by labetolol or nicardipine, consider sodium nitroprusside.  Admission to ICU CT brain 24 hours post tPA NPO until swallowing screen performed and passed No antiplatelet agents or anticoagulants (including heparin for DVT prophylaxis) in first 24 hours No Foley catheter, nasogastric tube, arterial catheter or central venous catheter for 24 hr, unless absolutely necessary Telemetry  Inpatient Neurology Consultation Stroke evaluation as per inpatient neurology recommendations Discussed with ED MD   ------------------------------------------------------------------------------  CC left weakness and aphasia  History of Present Illness   Patient is an 83 year old woman who presented with left side wekaness, aphasia. She fell around 1045, no reported head rauma, and no LOC, then she was perfectly fine,  then at around 1145 she was noted to develop the left weakness and speech difficulty. In Ed she still has same symptoms above.   Diagnostics CT head IMPRESSION: 1. Hyperdense right M1 segment, consistent with large  vessel occlusion. Evidence for evolving right MCA ischemia within the right temporal lobe. No acute intracranial hemorrhage. 2. ASPECTS is 7. 3. Moderately advanced cerebral atrophy with chronic small vessel ischemic disease, progressed relative to 2017.  Repeat CT head showed large IPH left side.   Exam   NIH Stroke Scale: Level of consciousness ( Alert = 0 ), Ask patient current month and age 25 ( Answers both correctly = 0 ), Ask patient to open and close eyes 2 ( Obeys both correctly = 0 ), Best gaze 2 ( Normal = 0 ), Visual field testing 1 ( No visual field loss = 0 ), Facial paresis 1 ( Normal symmetric movement = 0 ), Motor function left arm 4 ( Normal = 0 ), Motor function right arm ( Normal = 0 ), Motor function left leg 4 ( Normal = 0 ), Motor function right leg 2 ( Normal = 0 ), Limb ataxia ( No ataxia = 0 ), Sensory 1 ( Normal = 0 ), Best language 3 ( No aphasia = 0 ), Dysarthria 2 ( Normal articulation = 0 ), Extinction and inattention 2 ( Normal = 0 ), Total score  26.      Medical Decision Making:  - Extensive number of diagnosis or management options are considered above.   - Extensive amount of complex data reviewed.   - High risk of complication and/or morbidity or mortality are associated with differential diagnostic considerations above.  - There may be Uncertain outcome and increased probability of prolonged functional impairment or high probability of severe prolonged functional impairment associated with some of these differential diagnosis.  Medical Data Reviewed:  1.Data reviewed include clinical labs, radiology,  Medical Tests;   2.Tests results discussed w/performing or interpreting physician;   3.Obtaining/reviewing old medical records;  4.Obtaining case history from another source;  5.Independent review of image, tracing or specimen.    Patient was informed the Neurology Consult would happen via telehealth (remote video) and consented to receiving care in this  manner.

## 2018-06-15 NOTE — ED Triage Notes (Signed)
Pt brought in by [facility] for c/o left side neglect and paralysis; staff reported to ems that pt fell around 2245 tonight; pt has small hematoma to base of head; staff reports pt was LKW at 2345; pt only able to state her name and dob

## 2018-06-15 NOTE — Progress Notes (Signed)
PT Cancellation Note  Patient Details Name: [name] [name] MRN: [id number] DOB: 1935-02-12   Cancelled Treatment:    Reason Eval/Treat Not Completed: Other (comment)   (RN requesting to hold) Pt with medical decline at this time ( CT showing worsening )  Will follow as appropriate,   Roney Marion, [place] Pager [phone] Office [phone]    [name] 05/24/2018, 11:45 AM

## 2018-06-15 NOTE — Progress Notes (Signed)
SLP Cancellation Note  Patient Details Name: [name] [name] MRN: [id number] DOB: 09/08/1935   Cancelled treatment:       Reason Eval/Treat Not Completed: Patient not medically ready   [place], Katherene Ponto 06/04/2018, 12:53 PM

## 2018-06-15 NOTE — Progress Notes (Signed)
Pt daughter arrived to visit/spend the night. Pt became tachycardic in the 130's, tachypneic and reduced SPO2 to 88%. EKG obtained. NRB mask applied with increase SPO2 to 99%. Decreased environmental stimuli by turning down the lights and any noise, also asked pt daughter reduce stimuli to the pt at this time. Paged on call neurology Dr. Rory Percy, no new orders at this time.

## 2018-06-15 NOTE — Progress Notes (Signed)
OT Cancellation Note  Patient Details Name: [place] Thomason MRN: [id number] DOB: 1935/08/30   Cancelled Treatment:    Reason Eval/Treat Not Completed: Patient declined, no reason specified(RN requesting to hold) Pt with medical decline at this time ( CT showing worsening )  Vonita Moss   OTR/L Pager: [phone] Office: [phone] .  06/06/2018, 8:41 AM

## 2018-06-15 NOTE — Progress Notes (Signed)
Pt's daughter Donald Prose), verbalized to me she wanted to make her mom a DNR at this time. Dr. Leonie Man notified of code status change.

## 2018-06-15 NOTE — Progress Notes (Addendum)
STROKE TEAM PROGRESS NOTE  HPI: ( Dr Rory Percy) [name] [name] is a 83 y.o. female  Past medical history of AAA, coronary artery disease,?  Atrial fibrillation/PSVT, COPD, dementia, anxiety and depression, hyperlipidemia, pituitary microadenoma status post transsphenoidal resection 12/31/2006, history of pulmonary embolism, peripheral vascular disease, presented to [facility] from a nursing facility with a last known normal of 11:45 PM on 06/14/2018, within window for IV TPA with left-sided weakness.  She was evaluated by the emergency room physicians and telemedicine neurology specialist, And found to have a NIH stroke scale of 26 consistent with right MCA territory stroke.  Noncontrast CT of the head did not show any evidence of bleed but showed a possible dense right MCA and patchy areas of evolving right MCA infarct with an aspect score of 7.  Per my conversation with the telemedicine neurologist and review of notes, extensive discussion with patient's daughter about the risks and benefits of IV TPA were held and finally IV TPA was recommended and ordered. Patient was then transferred to [facility] for further management and for consideration for endovascular treatment. When I received a call for transfer, I had specifically asked about the modified Rankin score which was 4-5, and precluded the patient from any intervention. Patient arrived at [facility], and I met them on route to the neuro ICU in the emergency room.  Patient had been complaining of headache on the way.  She was taken in for a stat noncontrast CT of the head as well as a CTA.  Noncontrast CT of the head showed large left-sided intraparenchymal hematoma.  Approximately 34 cc. The CT angios head and neck showed possible right M3 occlusion. CT angios chest abdomen pelvis for dissection was also done that did not show any acute intrathoracic, abdominal pelvic pathology or dissection.  Extensive atherosclerotic  calcification of the thoracic and abdominal aorta was seen. Noncalcified plaque or mural thrombus along the posterior wall of the descending thoracic aorta with aneurysmal dilatation of proximal segment of the descending thoracic aorta measuring up to 4.8 cm seen.  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  Fusiform infrarenal abdominal aortic aneurysm with large peripheral intramural clot measuring 6.8 cm, slightly increased in size to the prior CT.  No evidence of leak or rupture.   LKW: 11:45 PM on 06/14/2018 tpa given?:  [facility] Premorbid modified Rankin scale (mRS):4 ICH Score: 3   INTERVAL HISTORY Her family is at the bedside. Per dtr, pt w/ fell at SNF at 1045pm last night. After she fell, she was oriented. SNF did not mention it. 2 hours following, she had neuro sx. Unclear if fall caused stroke, more likely stroke caused fall. Hard to know.repeat CT scan from this morning shows unfortunately progressive ICH s/p tPA yesterday. Dr. Leonie Man discussed poor prognosis with dtr and people at d/c. dtr not ready to make decision. Feels brother would want every thing done. She will call him. Agreeable to palliative consult.  Vitals:   06/06/2018 0645 06/06/2018 0700 05/27/2018 0715 06/10/2018 0730  BP: 124/76 135/68 139/73 (!) 143/74  Pulse: (!) 57 (!) 55 (!) 59 60  Resp: _0 Temp:      TempSrc:      SpO2: 100% 100% 100% 100%  Weight:        CBC:  Recent Labs  Lab 05/31/2018 0210 06/14/2018 0223  WBC 10.6*  --  NEUTROABS 6.5  --   HGB 12.1 10.9*  HCT 39.5 32.0*  MCV 91.4  --   PLT 164  --     Basic Metabolic Panel:  Recent Labs  Lab 05/27/2018 0210 05/23/2018 0223  NA 139 141  K 4.0 3.9  CL 104 105  CO2 25  --   GLUCOSE 102* 96  BUN 43* 37*  CREATININE 1.60* 1.50*  CALCIUM 8.7*  --    Lipid Panel:     Component Value Date/Time   CHOL 161 06/11/2015 1554   TRIG 103 06/11/2015 1554    HDL 62 06/11/2015 1554   CHOLHDL 2.6 06/11/2015 1554   VLDL 21 06/11/2015 1554   LDLCALC 78 06/11/2015 1554   HgbA1c:  Lab Results  Component Value Date   HGBA1C 5.3 10/01/2016   Urine Drug Screen:     Component Value Date/Time   LABOPIA POSITIVE (A) 06/16/2018 0226   COCAINSCRNUR NONE DETECTED 06/05/2018 0226   LABBENZ POSITIVE (A) 06/16/2018 0226   AMPHETMU NONE DETECTED 05/29/2018 0226   THCU NONE DETECTED 06/19/2018 0226   LABBARB (A) 06/19/2018 0226    Result not available. Reagent lot number recalled by manufacturer.    Alcohol Level     Component Value Date/Time   ETH <10 06/19/2018 0210    IMAGING Ct Angio Head W Or Wo Contrast  Result Date: 06/12/2018 CLINICAL DATA:  Initial evaluation for acute stroke. Probable right MCA territory PE EXAM: CT ANGIOGRAPHY HEAD AND NECK TECHNIQUE: Multidetector CT imaging of the head and neck was performed using the standard protocol during bolus administration of intravenous contrast. Multiplanar CT image reconstructions and MIPs were obtained to evaluate the vascular anatomy. Carotid stenosis measurements (when applicable) are obtained utilizing NASCET criteria, using the distal internal carotid diameter as the denominator. CONTRAST:  116m ISOVUE-370 IOPAMIDOL (ISOVUE-370) INJECTION 76% COMPARISON:  Prior head CT from earlier the same day. FINDINGS: CT HEAD FINDINGS Brain: There has been interval development of in intraparenchymal hematoma centered at the left occipital lobe, measuring 4.8 x 2.9 x 5.2 cm (36 cc). Internal fluid fluid levels. Subdural extension with small amount of blood seen along the posterior falx. Localized mass effect on the adjacent atrium of the left lateral ventricle without intraventricular extension. No midline shift. Continued interval evolution of right MCA territory infarct, now more evident as compared to previous exam. Ischemic changes involve essentially the entirety of the right temporal lobe. Possible faint  petechial hemorrhage without frank hemorrhagic transformation (series 4, image 16). No significant mass effect at this time. No other new acute large vessel territory infarct. Stable ventricular size without hydrocephalus. No mass lesion. Atrophy with chronic small vessel ischemic disease again noted. Vascular: Asymmetric hyperdensity within distal right M1/M2 branches, concerning for thrombus. Calcified atherosclerosis at the skull base. Skull: No appreciable scalp soft tissue injury. Calvarium intact. Visualized osseous structures are demineralized. Sinuses: Scattered mucosal thickening within the ethmoidal air cells and maxillary sinuses. Mastoid air cells grossly clear. Orbits: Globes and orbital soft tissues demonstrate no acute finding. Patient status post ocular lens extraction on the left. Review of the MIP images confirms the above findings CTA NECK FINDINGS Aortic arch: Partially visualized aortic arch within normal limits for caliber with normal 3 vessel morphology. Extensive atheromatous plaque seen throughout the visualized arch and about the origin of the great vessels without high-grade stenosis. Partially visualized subclavian arteries patent without hemodynamically significant stenosis. Right carotid system: Right common carotid artery demonstrates scattered atheromatous plaque without high-grade stenosis. Calcified  plaque at the right bifurcation with associated stenosis of up to approximately 70% by NASCET criteria. Right ICA tortuous but widely patent distally to the skull base without stenosis, dissection, or occlusion. Left carotid system: Left common carotid artery tortuous but patent to the bifurcation without high-grade stenosis. Mixed plaque at the bifurcation/proximal left ICA with associated stenosis of up to 50% by NASCET criteria. Left ICA patent to the skull base distally without additional stenosis, dissection, or occlusion. Vertebral arteries: Both of the vertebral arteries arise  from the subclavian arteries.Atheromatous plaque at the origin of the vertebral arteries bilaterally without high-grade stenosis. Vertebral arteries diffusely tortuous but patent within the neck without stenosis or occlusion. Skeleton: Severe scoliosis of the cervicothoracic spine with no definite acute osseous abnormality identified. No worrisome lytic or blastic osseous lesions. Multilevel facet arthrosis noted within the visualized spine. Other neck: Soft tissues of the neck demonstrate no acute finding. Salivary glands within normal limits. No adenopathy. Thyroid within normal limits. Upper chest: Partially visualized upper chest demonstrates no acute abnormality. 6 mm nodule present at the mid left upper lobe (series 9, image 139). Additional 4 mm left upper lobe nodule noted (series 9, image 149). Visualized lungs are otherwise clear. Review of the MIP images confirms the above findings CTA HEAD FINDINGS Anterior circulation: Petrous segments patent bilaterally. Mild scattered atheromatous plaque within the cavernous/supraclinoid ICAs without high-grade stenosis. ICA termini widely patent. Right A1 widely patent. Left A1 patent but hypoplastic. Normal and patent anterior communicating artery. 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 This is directed superiorly. Left M1 patent without high-grade stenosis. No proximal left M2 occlusion. Distal left MCA branches well perfused. Right M1 patent to the bifurcation. There is abrupt occlusion of a proximal right M3 segment (series 13, image 66). Right MCA branches otherwise perfused distally. Posterior circulation: Focal plaque with moderate stenosis within the dominant right V4 segment. Hypoplastic left vertebral artery patent without stenosis. Basilar irregular and somewhat diminutive, but patent to its distal aspect without high-grade stenosis superior cerebral arteries grossly patent proximally. Predominant fetal type origin of the PCAs supplied  via patent posterior communicating arteries. PCAs irregular but patent to their distal aspects without stenosis. Venous sinuses: Grossly patent, although not well assessed due to arterial timing of the contrast bolus. Anatomic variants: Fetal type origin of the PCAs. Delayed phase: Not performed. Review of the MIP images confirms the above findings IMPRESSION: CT HEAD IMPRESSION 1. Interval development of a 4.8 x 2.9 x 5.2 cm left parieto-occipital intraparenchymal hematoma (estimated volume 36 cc). Regional mass effect without significant midline shift. No intraventricular extension. 2. Continued interval evolution of acute right MCA territory infarct, involving the majority of the right temporal lobe. Possible faint petechial hemorrhage without frank hemorrhagic transformation. 3. Underlying atrophy with chronic small vessel ischemic disease. CTA HEAD AND NECK IMPRESSION 1. Acute proximal right M3 occlusion, just distal to the bifurcation. 2. Atheromatous plaque about the carotid bifurcations with associated stenosis of up to 70% on the right and 50% on the left. 3. 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 4. **An incidental finding of potential clinical significance has been found. Left upper lobe pulmonary nodules measuring up to 6 mm. Non-contrast chest CT at 3-6 months is recommended. If the nodules are stable at time of repeat CT, then future CT at 18-24 months (from today's scan) is considered optional for low-risk patients, but is recommended for high-risk patients. This recommendation follows the consensus statement: Guidelines for Management of Incidental Pulmonary Nodules Detected on  CT Images: From the Fleischner Society 2017; Radiology 2017; [phone].** Electronically Signed   By: Jeannine Boga M.D.   On: 05/30/2018 05:09   Ct Angio Neck W Or Wo Contrast  Result Date: 05/29/2018 CLINICAL DATA:  Initial evaluation for acute stroke. Probable right MCA territory PE EXAM: CT ANGIOGRAPHY HEAD AND NECK  TECHNIQUE: Multidetector CT imaging of the head and neck was performed using the standard protocol during bolus administration of intravenous contrast. Multiplanar CT image reconstructions and MIPs were obtained to evaluate the vascular anatomy. Carotid stenosis measurements (when applicable) are obtained utilizing NASCET criteria, using the distal internal carotid diameter as the denominator. CONTRAST:  146m ISOVUE-370 IOPAMIDOL (ISOVUE-370) INJECTION 76% COMPARISON:  Prior head CT from earlier the same day. FINDINGS: CT HEAD FINDINGS Brain: There has been interval development of in intraparenchymal hematoma centered at the left occipital lobe, measuring 4.8 x 2.9 x 5.2 cm (36 cc). Internal fluid fluid levels. Subdural extension with small amount of blood seen along the posterior falx. Localized mass effect on the adjacent atrium of the left lateral ventricle without intraventricular extension. No midline shift. Continued interval evolution of right MCA territory infarct, now more evident as compared to previous exam. Ischemic changes involve essentially the entirety of the right temporal lobe. Possible faint petechial hemorrhage without frank hemorrhagic transformation (series 4, image 16). No significant mass effect at this time. No other new acute large vessel territory infarct. Stable ventricular size without hydrocephalus. No mass lesion. Atrophy with chronic small vessel ischemic disease again noted. Vascular: Asymmetric hyperdensity within distal right M1/M2 branches, concerning for thrombus. Calcified atherosclerosis at the skull base. Skull: No appreciable scalp soft tissue injury. Calvarium intact. Visualized osseous structures are demineralized. Sinuses: Scattered mucosal thickening within the ethmoidal air cells and maxillary sinuses. Mastoid air cells grossly clear. Orbits: Globes and orbital soft tissues demonstrate no acute finding. Patient status post ocular lens extraction on the left. Review of  the MIP images confirms the above findings CTA NECK FINDINGS Aortic arch: Partially visualized aortic arch within normal limits for caliber with normal 3 vessel morphology. Extensive atheromatous plaque seen throughout the visualized arch and about the origin of the great vessels without high-grade stenosis. Partially visualized subclavian arteries patent without hemodynamically significant stenosis. Right carotid system: Right common carotid artery demonstrates scattered atheromatous plaque without high-grade stenosis. Calcified plaque at the right bifurcation with associated stenosis of up to approximately 70% by NASCET criteria. Right ICA tortuous but widely patent distally to the skull base without stenosis, dissection, or occlusion. Left carotid system: Left common carotid artery tortuous but patent to the bifurcation without high-grade stenosis. Mixed plaque at the bifurcation/proximal left ICA with associated stenosis of up to 50% by NASCET criteria. Left ICA patent to the skull base distally without additional stenosis, dissection, or occlusion. Vertebral arteries: Both of the vertebral arteries arise from the subclavian arteries.Atheromatous plaque at the origin of the vertebral arteries bilaterally without high-grade stenosis. Vertebral arteries diffusely tortuous but patent within the neck without stenosis or occlusion. Skeleton: Severe scoliosis of the cervicothoracic spine with no definite acute osseous abnormality identified. No worrisome lytic or blastic osseous lesions. Multilevel facet arthrosis noted within the visualized spine. Other neck: Soft tissues of the neck demonstrate no acute finding. Salivary glands within normal limits. No adenopathy. Thyroid within normal limits. Upper chest: Partially visualized upper chest demonstrates no acute abnormality. 6 mm nodule present at the mid left upper lobe (series 9, image 139). Additional 4 mm left upper lobe nodule noted (  series 9, image 149).  Visualized lungs are otherwise clear. Review of the MIP images confirms the above findings CTA HEAD FINDINGS Anterior circulation: Petrous segments patent bilaterally. Mild scattered atheromatous plaque within the cavernous/supraclinoid ICAs without high-grade stenosis. ICA termini widely patent. Right A1 widely patent. Left A1 patent but hypoplastic. Normal and patent anterior communicating artery. 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 This is directed superiorly. Left M1 patent without high-grade stenosis. No proximal left M2 occlusion. Distal left MCA branches well perfused. Right M1 patent to the bifurcation. There is abrupt occlusion of a proximal right M3 segment (series 13, image 66). Right MCA branches otherwise perfused distally. Posterior circulation: Focal plaque with moderate stenosis within the dominant right V4 segment. Hypoplastic left vertebral artery patent without stenosis. Basilar irregular and somewhat diminutive, but patent to its distal aspect without high-grade stenosis superior cerebral arteries grossly patent proximally. Predominant fetal type origin of the PCAs supplied via patent posterior communicating arteries. PCAs irregular but patent to their distal aspects without stenosis. Venous sinuses: Grossly patent, although not well assessed due to arterial timing of the contrast bolus. Anatomic variants: Fetal type origin of the PCAs. Delayed phase: Not performed. Review of the MIP images confirms the above findings IMPRESSION: CT HEAD IMPRESSION 1. Interval development of a 4.8 x 2.9 x 5.2 cm left parieto-occipital intraparenchymal hematoma (estimated volume 36 cc). Regional mass effect without significant midline shift. No intraventricular extension. 2. Continued interval evolution of acute right MCA territory infarct, involving the majority of the right temporal lobe. Possible faint petechial hemorrhage without frank hemorrhagic transformation. 3. Underlying atrophy  with chronic small vessel ischemic disease. CTA HEAD AND NECK IMPRESSION 1. Acute proximal right M3 occlusion, just distal to the bifurcation. 2. Atheromatous plaque about the carotid bifurcations with associated stenosis of up to 70% on the right and 50% on the left. 3. 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 4. **An incidental finding of potential clinical significance has been found. Left upper lobe pulmonary nodules measuring up to 6 mm. Non-contrast chest CT at 3-6 months is recommended. If the nodules are stable at time of repeat CT, then future CT at 18-24 months (from today's scan) is considered optional for low-risk patients, but is recommended for high-risk patients. This recommendation follows the consensus statement: Guidelines for Management of Incidental Pulmonary Nodules Detected on CT Images: From the Fleischner Society 2017; Radiology 2017; [id number].** Electronically Signed   By: Jeannine Boga M.D.   On: 06/04/2018 05:09   Dg Chest Port 1 View  Result Date: 05/29/2018 CLINICAL DATA:  History of recent stroke EXAM: PORTABLE CHEST 1 VIEW COMPARISON:  06/19/2018 FINDINGS: Cardiac shadow is accentuated by patient rotation. Left basilar atelectasis is again noted and stable. Mild right basilar atelectasis is noted new from the prior exam. No bony abnormality is seen. IMPRESSION: Significant patient rotation to the left. Bibasilar changes are noted new on the right when compared with the recent CT. Electronically Signed   By: Inez Catalina M.D.   On: 06/08/2018 08:55   Ct Angio Chest/abd/pel For Dissection W And/or W/wo  Result Date: 06/18/2018 CLINICAL DATA:  83 year old female with aortic aneurysm. Evaluate for rupture or leak. EXAM: CT ANGIOGRAPHY CHEST, ABDOMEN AND PELVIS TECHNIQUE: Multidetector CT imaging through the chest, abdomen and pelvis was performed using the standard protocol during bolus administration of intravenous contrast. Multiplanar reconstructed images and MIPs were obtained  and reviewed to evaluate the vascular anatomy. CONTRAST:  35m ISOVUE-370 IOPAMIDOL (ISOVUE-370) INJECTION 76% COMPARISON:  CT  of the abdomen pelvis dated 11/07/2015. FINDINGS: Evaluation is limited due to streak artifact caused by patient's arms. CTA CHEST FINDINGS Cardiovascular: There is no cardiomegaly or pericardial effusion. Multi vessel coronary vascular calcification. There is advanced atherosclerotic calcification of the thoracic aorta. There is aneurysmal dilatation of proximal segment of the descending thoracic aorta measuring up to 4.8 cm in maximal axial diameter. There is noncalcified plaque or mural thrombus along the posterior wall of the descending thoracic aorta. No dissection or evidence of extraluminal contrast to suggest rupture/leak. The origins of the great vessels of the aortic arch appear patent. The main pulmonary arteries are slightly prominent which may represent a degree of pulmonary hypertension. Mediastinum/Nodes: There is no hilar or mediastinal adenopathy. Esophagus is grossly unremarkable. No mediastinal fluid collection or hematoma. Lungs/Pleura: There is a focal area of consolidative change in the lingula, which may represent atelectasis or scarring. Pneumonia is less likely but not excluded. Clinical correlation is recommended. There is no pleural effusion or pneumothorax. The central airways are patent. Musculoskeletal: There is scoliosis with degenerative changes of the spine. No acute osseous pathology. Review of the MIP images confirms the above findings. CTA ABDOMEN AND PELVIS FINDINGS VASCULAR Aorta: There is advanced atherosclerotic disease of the abdominal aorta. There is focal outpouching of the anterior wall of the aorta below the origin of the celiac axis and superior to the SMA. The aorta at this level measures up to 4.1 cm in maximal axial diameter (previously 4.0 cm). There is a fusiform infrarenal abdominal aortic aneurysm measuring 6.8 cm in greatest transverse  axial diameter. This aneurysm has slightly grown since the prior CT where it measured in the range of 6.1-6.7 cm axial diameter. There is large amount of mural thrombus in the periphery of this aneurysm. This aneurysm measures approximately 8.5 cm in craniocaudal length. Faint curvilinear high attenuation in the moral thrombus of this aneurysm is likely chronic. However, evaluation is limited in the absence of non-contrast images. There is no periaortic inflammatory changes or stranding or evidence of rupture. Celiac: Patent as visualized.  No aneurysm. SMA: Narrowing of the origin of the SMA secondary to noncalcified plaque at the saccular aneurysm of the suprarenal aorta. The SMA remains patent. Renals: Multiple bilateral renal arteries which appear patent however likely narrowed at the origin secondary to extensive aortic atherosclerosis. IMA: The IMA remain patent. Inflow: Extensive atherosclerotic disease. There is thrombus in the proximal right internal iliac artery with reconstitution of flow distally. The iliac vessels are otherwise patent. Veins: No obvious venous abnormality within the limitations of this arterial phase study. Review of the MIP images confirms the above findings. NON-VASCULAR Hepatobiliary: The liver is unremarkable. Small amount of sludge or small stones may be present within the gallbladder. No pericholecystic fluid. Pancreas: Unremarkable. No pancreatic ductal dilatation or surrounding inflammatory changes. Spleen: Normal in size without focal abnormality. Adrenals/Urinary Tract: The right adrenal gland is unremarkable. There is a 10 mm left adrenal nodule. The kidneys are atrophic. Right renal upper pole hypodense lesion similar to prior CT most likely a cyst. There is no hydronephrosis on either side. The visualized ureters appears unremarkable. The urinary bladder is partially distended around a Foley catheter. Stomach/Bowel: No bowel obstruction or active inflammation. The appendix  is not visualized with certainty. No inflammatory changes identified in the right lower quadrant. Lymphatic: No adenopathy. Reproductive: The uterus and ovaries are grossly unremarkable. No pelvic mass. Other: None Musculoskeletal: Osteopenia and degenerative changes of the spine. Severe bilateral hip osteoarthritis. No  acute fracture. Review of the MIP images confirms the above findings. IMPRESSION: 1. No acute intrathoracic, abdominal, or pelvic pathology. No aortic dissection. 2. Extensive atherosclerotic calcification of the thoracic and abdominal aorta. There is noncalcified plaque or mural thrombus along the posterior wall of the descending thoracic aorta with aneurysmal dilatation of proximal segment of the descending thoracic aorta measuring up to 4.8 cm in maximal diameter. 3. 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 4. Fusiform infrarenal abdominal aortic aneurysm with large peripheral intramural clot and measuring 6.8 cm in maximal axial diameter which is slightly increased in size compared to the prior CT (previously measured 6.1-6.7 cm in axial diameter). No periaortic inflammation or evidence of leak/rupture. 5. Additional nonacute findings as above. Electronically Signed   By: Anner Crete M.D.   On: 06/07/2018 05:20   Ct Head Code Stroke Wo Contrast  Result Date: 06/07/2018 CLINICAL DATA:  Code stroke. Initial evaluation for acute altered mental status. EXAM: CT HEAD WITHOUT CONTRAST TECHNIQUE: Contiguous axial images were obtained from the base of the skull through the vertex without intravenous contrast. COMPARISON:  Prior CT from 10/01/2016. FINDINGS: Brain: Generalized age-related cerebral atrophy with chronic small vessel ischemic disease, progressed relative to most recent CT from 10/01/2016. Few scattered remote lacunar infarcts seen involving the bilateral basal ganglia. No acute intracranial  hemorrhage. Loss of gray-white matter differentiation seen involving the right temporal lobe, involving the right M1, and to as well as portions of the right M3 segments. Right insula is relatively well maintained, although there may be some involvement inferiorly. Basal ganglia preserved as is the internal capsule. Supra ganglionic gray-white matter differentiation relatively well maintained as well. No mass lesion or midline shift. No hydrocephalus. No extra-axial fluid collection. Subcentimeter hyperdensity along the anterior falx slightly more prominent as compared to previous exam, but favored to be chronic in nature. Vascular: Calcified atherosclerosis at the skull base. Asymmetric hyperdensity at the right M1 and proximal M2 segments, suspicious for large vessel occlusion. Skull: Scalp soft tissues within normal limits.  Calvarium intact. Sinuses/Orbits: Globes and orbital soft tissues demonstrate no acute finding. Patient status post ocular lens replacement on the left. Mild chronic mucoperiosteal thickening within the ethmoidal air cells and maxillary sinuses. Paranasal sinuses are otherwise clear. No mastoid effusion. Other: None. ASPECTS [facility] Stroke Program Early CT Score) - Ganglionic level infarction (caudate, lentiform nuclei, internal capsule, insula, M1-M3 cortex): 4 - Supraganglionic infarction (M4-M6 cortex): 3 Total score (0-10 with 10 being normal): 7 IMPRESSION: 1. Hyperdense right M1 segment, consistent with large vessel occlusion. Evidence for evolving right MCA ischemia within the right temporal lobe. No acute intracranial hemorrhage. 2. ASPECTS is 7. 3. Moderately advanced cerebral atrophy with chronic small vessel ischemic disease, progressed relative to 2017. Critical Value/emergent results were called by telephone at the time of interpretation on 06/09/2018 at 2:16 am to Dr. Ezequiel Essex , who verbally acknowledged these results. Electronically Signed   By: Jeannine Boga M.D.    On: 06/10/2018 02:28    PHYSICAL EXAM : frail elderly Caucasian lady who is intubated not sedated. . Afebrile. Head is nontraumatic. Neck is supple without bruit.    Cardiac exam no murmur or gallop. Lungs are clear to auscultation. Distal pulses are well felt. Neurological Exam :  Comatose unresponsive. Eyes are closed. Intubated. Left gaze deviation with slightly disconjugate eyes with left eye hypotropia. Pupils 3 mm irregular sluggishly reactive. Corneal  reflexes are sluggish bilaterally. Fundi not visualized. Weak cough and gag. Some respiratory effort above ventilator settings.no facial weakness. Tongue midline. Motor system exam minimum withdrawal to noxious stimuli in the left upper and lower extremity and to slightly extend the right lower extremity. No movement in the right approximately. Tone is diminishedbilaterally. Both plantars are equivocal. ASSESSMENT/PLAN Ms. [name] Mckinstry is a 83 y.o. female with history of AAA, CAD, AF not on AC, COPD, PVD, dementia, HLD, hx PE, pituitary macroadenoma s/p resection presenting from SNF with L hemiparesis to AP. Received IV tPA. Post tPA HA. CT showed post tPA L parietal-occipital hemorrhage that was reversed w/ cryo, TXA but continued to worsen over night.  Stroke:  right MCA infarct d/p IV tPA with symptomatic post tPA L parietal/occipital hmg likely due to amyloid as not within acute stroke region. Infarct embolic secondary to known atrial fibrillation not on AC.  Code Stroke CT head 6/25 0156 hyperdense R M1. Small vessel disease. Atrophy. ASPECTS 7    CT head 6/25 0345 small L parieto-occipital IPH 36cc, mass effect w/o shift. No IVH. Evolution R MCA infarct w/ possible HT. Small vessel disease. Atrophy.   CTA head & neck 6/25 0345 R M3 occlusion. R ICA 70%, L ICA 50% stenosis. 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 LUL pulm nodules 676m(f/u 3- mos)  CTA chest 6/25 0448 no acute abnormality. Extensive atherosclerosis thoracic & abd aorta. Descending  aorta plaque/mural thrombus. 4.1 saccular aneurysm suprarenal aorta. 6.8 fusiform infrarenal AAA w/ large peripheral intramural clot.  CT head 6/25 086606ending (reviewed by Dr. SeLeonie Manincreased size of hmg)  MRI  pending   2D Echo  pending   LDL pending   HgbA1c pending  SCDs for VTE prophylaxis Diet Order           Diet NPO time specified  Diet effective now           No antithrombotic prior to admission, now on No antithrombotic as within 24h of tPA administration and post tPA hmg.   Therapy recommendations:  pending   Disposition:  pending   Neuro prognosis poor  Will ask palliative care to help with goals of care. Daughter at beside, understands but thinking about what she wants. She states brother would "cause a ruckus". Will need to meet with them together.   Atrial Fibrillation  Home anticoagulation:  none   Hypertension  SBP goal < 140  On cleviprex  Hyperlipidemia  Home meds:  No statin  LDL pending, goal < 70  Other Stroke Risk Factors  Advanced age  Obesity, Body mass index is 35.58 kg/m., recommend weight loss, diet and exercise as appropriate   Other Active Problems  CXR - R basilar changes  Hospital day # 0  ShBurnetta SabinMSN, APRN, ANVP-BC, AGPCNP-BC Advanced Practice Stroke Nurse [place] Schedule & Pager information 06/03/2018 9:04 AM  I have personally examined this patient, reviewed notes, independently viewed imaging studies, participated in medical decision making and plan of care.ROS completed by me personally and pertinent positives fully documented  I have made any additions or clarifications directly to the above note. Agree with note above.  The patient presented to an outside hospital with right MCA infarct and was given IV TPA but unfortunately had neurological worsening in route and on arrival CT scan showed left occipital hemorrhage. She had neurological as well as radiological worsening  despite reversal of tPA and neurological exam is quite poor and patient's prognosis for  survival and making meaningful recovery is extremely slim. I had a long discussion with the patient's daughter as well as son who arrived later about her poor prognosis. Family agrees to DO NOT RESUSCITATE but needs more time to process information and make a final decision about comfort care. Continue supportive care to then. Repeat CT scan of the head tomorrow morning. Maintain strict control of blood pressure as per post TPA and hemorrhage protocol.This patient is critically ill and at significant risk of neurological worsening, death and care requires constant monitoring of vital signs, hemodynamics,respiratory and cardiac monitoring, extensive review of multiple databases, frequent neurological assessment, discussion with family, other specialists and medical decision making of high complexity.I have made any additions or clarifications directly to the above note.This critical care time does not reflect procedure time, or teaching time or supervisory time of PA/NP/Med Resident etc but could involve care discussion time.  I spent 50 minutes of neurocritical care time  in the care of  this patient.      Antony Contras, MD Medical Director [facility] Pager: [phone] 06/02/2018 4:59 PM  To contact Stroke Continuity provider, please refer to [email or web address] After hours, contact General Neurology

## 2018-06-16 ENCOUNTER — Inpatient Hospital Stay (HOSPITAL_COMMUNITY): Payer: Medicare Other

## 2018-06-16 DIAGNOSIS — Z515 Encounter for palliative care: Secondary | ICD-10-CM

## 2018-06-16 DIAGNOSIS — Z7189 Other specified counseling: Secondary | ICD-10-CM

## 2018-06-16 LAB — BPAM CRYOPRECIPITATE
Blood Product Expiration Date: 201906251110
Blood Product Expiration Date: 201906251110
ISSUE DATE / TIME: 201906250554
ISSUE DATE / TIME: 201906250554
UNIT TYPE AND RH: 5100
Unit Type and Rh: 5100

## 2018-06-16 LAB — PREPARE CRYOPRECIPITATE
UNIT DIVISION: 0
UNIT DIVISION: 0

## 2018-06-16 MED ORDER — MORPHINE 100MG IN NS 100ML (1MG/ML) PREMIX INFUSION: 2.0000 mg/h | INTRAVENOUS | Status: DC

## 2018-06-16 MED ORDER — MORPHINE 100MG IN NS 100ML (1MG/ML) PREMIX INFUSION
5.0000 mg/h | INTRAVENOUS | Status: DC
  Administered 2018-06-16: 1 mg/h via INTRAVENOUS
  Administered 2018-06-17: 5 mg/h via INTRAVENOUS
  Filled 2018-06-16: qty 100

## 2018-06-16 MED ORDER — LORAZEPAM 2 MG/ML IJ SOLN: 1.0000 mg | INTRAMUSCULAR | Status: DC | PRN

## 2018-06-16 MED ORDER — HALOPERIDOL LACTATE 5 MG/ML IJ SOLN: 0.5000 mg | INTRAMUSCULAR | Status: DC | PRN

## 2018-06-16 MED ORDER — GLYCOPYRROLATE 1 MG PO TABS
1.0000 mg | ORAL_TABLET | ORAL | Status: DC | PRN
Start: 2018-06-16 — End: 2018-06-17
  Filled 2018-06-16: qty 1

## 2018-06-16 MED ORDER — SCOPOLAMINE 1 MG/3DAYS TD PT72
1.0000 | MEDICATED_PATCH | TRANSDERMAL | Status: DC
  Administered 2018-06-16: 1.5 mg via TRANSDERMAL
  Filled 2018-06-16: qty 1

## 2018-06-16 MED ORDER — GLYCOPYRROLATE 0.2 MG/ML IJ SOLN: 0.2000 mg | INTRAMUSCULAR | Status: DC | PRN

## 2018-06-16 MED ORDER — ONDANSETRON HCL 4 MG/2ML IJ SOLN: 4.0000 mg | Freq: Four times a day (QID) | INTRAMUSCULAR | Status: DC | PRN

## 2018-06-16 MED ORDER — MORPHINE SULFATE (CONCENTRATE) 10 MG/0.5ML PO SOLN: 5.0000 mg | ORAL | Status: DC | PRN

## 2018-06-16 MED ORDER — MUSCLE RUB 10-15 % EX CREA
TOPICAL_CREAM | Freq: Two times a day (BID) | CUTANEOUS | Status: DC
  Administered 2018-06-16: 16:00:00 via TOPICAL
  Administered 2018-06-16: 1 via TOPICAL
  Filled 2018-06-16: qty 85

## 2018-06-16 MED ORDER — SODIUM CHLORIDE 0.9 % IV SOLN
250.0000 mL | INTRAVENOUS | Status: DC | PRN
  Administered 2018-06-16: 100 mL via INTRAVENOUS

## 2018-06-16 MED ORDER — DIAZEPAM 5 MG/ML IJ SOLN
5.0000 mg | Freq: Two times a day (BID) | INTRAMUSCULAR | Status: DC
  Administered 2018-06-16 – 2018-06-17 (×2): 5 mg via INTRAVENOUS
  Filled 2018-06-16 (×2): qty 2

## 2018-06-16 MED ORDER — POLYVINYL ALCOHOL 1.4 % OP SOLN
1.0000 [drp] | Freq: Four times a day (QID) | OPHTHALMIC | Status: DC | PRN
  Filled 2018-06-16: qty 15

## 2018-06-16 MED ORDER — ACETAMINOPHEN 650 MG RE SUPP: 650.0000 mg | Freq: Four times a day (QID) | RECTAL | Status: DC | PRN

## 2018-06-16 MED ORDER — ACETAMINOPHEN 325 MG PO TABS: 650.0000 mg | ORAL_TABLET | Freq: Four times a day (QID) | ORAL | Status: DC | PRN

## 2018-06-16 MED ORDER — HALOPERIDOL LACTATE 2 MG/ML PO CONC
0.5000 mg | ORAL | Status: DC | PRN
  Filled 2018-06-16: qty 0.3

## 2018-06-16 MED ORDER — SODIUM CHLORIDE 0.9% FLUSH: 3.0000 mL | Freq: Two times a day (BID) | INTRAVENOUS | Status: DC

## 2018-06-16 MED ORDER — HALOPERIDOL 0.5 MG PO TABS
0.5000 mg | ORAL_TABLET | ORAL | Status: DC | PRN
  Filled 2018-06-16: qty 1

## 2018-06-16 MED ORDER — MORPHINE BOLUS VIA INFUSION
4.0000 mg | INTRAVENOUS | Status: DC | PRN
  Administered 2018-06-16 – 2018-06-17 (×5): 4 mg via INTRAVENOUS
  Filled 2018-06-16: qty 4

## 2018-06-16 MED ORDER — ONDANSETRON 4 MG PO TBDP: 4.0000 mg | ORAL_TABLET | Freq: Four times a day (QID) | ORAL | Status: DC | PRN

## 2018-06-16 MED ORDER — GLYCOPYRROLATE 0.2 MG/ML IJ SOLN
0.4000 mg | INTRAMUSCULAR | Status: DC | PRN
  Administered 2018-06-16 – 2018-06-17 (×5): 0.4 mg via INTRAVENOUS
  Filled 2018-06-16 (×6): qty 2

## 2018-06-16 MED ORDER — SODIUM CHLORIDE 0.9% FLUSH: 3.0000 mL | INTRAVENOUS | Status: DC | PRN

## 2018-06-16 MED ORDER — MORPHINE SULFATE (CONCENTRATE) 10 MG/0.5ML PO SOLN
5.0000 mg | ORAL | Status: DC | PRN
  Administered 2018-06-16: 5 mg via SUBLINGUAL
  Filled 2018-06-16: qty 0.5

## 2018-06-16 MED ORDER — POLYVINYL ALCOHOL 1.4 % OP SOLN: 1.0000 [drp] | Freq: Four times a day (QID) | OPHTHALMIC | Status: DC | PRN

## 2018-06-16 NOTE — Social Work (Signed)
CSW will make referral for pt to [facility] pending medical stability in the morning.   [place], [place] Work [phone]

## 2018-06-16 NOTE — Progress Notes (Signed)
OT Cancellation Note  Patient Details Name: [name] [name] MRN: [id number] DOB: Feb 28, 1935   Cancelled Treatment:    Reason Eval/Treat Not Completed: Medical issues which prohibited therapy(RN requesting to hold ); will follow.   Lou Cal, OT Pager [phone] 06/16/2018   [name] 06/16/2018, 8:30 AM

## 2018-06-16 NOTE — Progress Notes (Signed)
PT Cancellation Note  Patient Details Name: [name] [name] MRN: [id number] DOB: 07-Aug-1935   Cancelled Treatment:    Reason Eval/Treat Not Completed: Other (comment).  PT to sign off at this time.  If family decides to pursue treatment, please re-order therapy.  Thanks,    Barbarann Ehlers. [place], [place], DPT [phone]   06/16/2018, 9:24 AM

## 2018-06-16 NOTE — Progress Notes (Signed)
OT Sign OFF Note  Patient Details Name: [name] [name] MRN: [id number] DOB: 1935-07-15   Cancelled Treatment:    Reason Eval/Treat Not Completed: Patient not medically ready Pt medically unstable with pending discussion for possible comfort care measures. Please reorder therapy if activity orders placed or family agrees to more intense measures of care.  Vonita Moss   OTR/L Pager: [phone] Office: [phone] .  06/16/2018, 8:36 AM

## 2018-06-16 NOTE — Progress Notes (Signed)
STROKE TEAM PROGRESS NOTE    INTERVAL HISTORY Her family is at the bedside.  The patient was made comfort care this morning after discussion with her daughter and rounds and transferred to hospice unit on morphine. The patient's son arrived later in the afternoon and I met with him and answered his questions Repeat CT scan of the head this morning shows worsening subarachnoid and intraventricular hemorrhage and hydrocephalus and brain edema Vitals:   06/16/18 0800 06/16/18 1000 06/16/18 1200 06/16/18 1316  BP: 136/63 132/71 132/66   Pulse: 80 69 70   Resp: (!) 34 (!) 21 (!) 36   Temp: 99.6 F (37.6 C)  98.6 F (37 C)   TempSrc: Axillary  Oral   SpO2: 93% 92% (!) 81% (!) 88%  Weight:        CBC:  Recent Labs  Lab 06/08/2018 0210 06/14/2018 0223  WBC 10.6*  --   NEUTROABS 6.5  --   HGB 12.1 10.9*  HCT 39.5 32.0*  MCV 91.4  --   PLT 164  --     Basic Metabolic Panel:  Recent Labs  Lab 05/26/2018 0210 06/14/2018 0223  NA 139 141  K 4.0 3.9  CL 104 105  CO2 25  --   GLUCOSE 102* 96  BUN 43* 37*  CREATININE 1.60* 1.50*  CALCIUM 8.7*  --    Lipid Panel:     Component Value Date/Time   CHOL 230 (H) 05/29/2018 0920   TRIG 476 (H) 06/18/2018 0920   HDL 38 (L) 06/16/2018 0920   CHOLHDL 6.1 06/06/2018 0920   VLDL UNABLE TO CALCULATE IF TRIGLYCERIDE OVER 400 mg/dL 06/14/2018 0920   LDLCALC UNABLE TO CALCULATE IF TRIGLYCERIDE OVER 400 mg/dL 05/28/2018 0920   HgbA1c:  Lab Results  Component Value Date   HGBA1C 5.7 (H) 06/05/2018   Urine Drug Screen:     Component Value Date/Time   LABOPIA POSITIVE (A) 06/06/2018 0226   COCAINSCRNUR NONE DETECTED 06/20/2018 0226   LABBENZ POSITIVE (A) 05/28/2018 0226   AMPHETMU NONE DETECTED 05/27/2018 0226   THCU NONE DETECTED 05/26/2018 0226   LABBARB (A) 06/09/2018 0226    Result not available. Reagent lot number recalled by manufacturer.    Alcohol Level     Component Value Date/Time   ETH <10 06/07/2018 0210    IMAGING Ct  Angio Head W Or Wo Contrast  Result Date: 05/23/2018 CLINICAL DATA:  Initial evaluation for acute stroke. Probable right MCA territory PE EXAM: CT ANGIOGRAPHY HEAD AND NECK TECHNIQUE: Multidetector CT imaging of the head and neck was performed using the standard protocol during bolus administration of intravenous contrast. Multiplanar CT image reconstructions and MIPs were obtained to evaluate the vascular anatomy. Carotid stenosis measurements (when applicable) are obtained utilizing NASCET criteria, using the distal internal carotid diameter as the denominator. CONTRAST:  160m ISOVUE-370 IOPAMIDOL (ISOVUE-370) INJECTION 76% COMPARISON:  Prior head CT from earlier the same day. FINDINGS: CT HEAD FINDINGS Brain: There has been interval development of in intraparenchymal hematoma centered at the left occipital lobe, measuring 4.8 x 2.9 x 5.2 cm (36 cc). Internal fluid fluid levels. Subdural extension with small amount of blood seen along the posterior falx. Localized mass effect on the adjacent atrium of the left lateral ventricle without intraventricular extension. No midline shift. Continued interval evolution of right MCA territory infarct, now more evident as compared to previous exam. Ischemic changes involve essentially the entirety of the right temporal lobe. Possible faint petechial hemorrhage without frank hemorrhagic  transformation (series 4, image 16). No significant mass effect at this time. No other new acute large vessel territory infarct. Stable ventricular size without hydrocephalus. No mass lesion. Atrophy with chronic small vessel ischemic disease again noted. Vascular: Asymmetric hyperdensity within distal right M1/M2 branches, concerning for thrombus. Calcified atherosclerosis at the skull base. Skull: No appreciable scalp soft tissue injury. Calvarium intact. Visualized osseous structures are demineralized. Sinuses: Scattered mucosal thickening within the ethmoidal air cells and maxillary  sinuses. Mastoid air cells grossly clear. Orbits: Globes and orbital soft tissues demonstrate no acute finding. Patient status post ocular lens extraction on the left. Review of the MIP images confirms the above findings CTA NECK FINDINGS Aortic arch: Partially visualized aortic arch within normal limits for caliber with normal 3 vessel morphology. Extensive atheromatous plaque seen throughout the visualized arch and about the origin of the great vessels without high-grade stenosis. Partially visualized subclavian arteries patent without hemodynamically significant stenosis. Right carotid system: Right common carotid artery demonstrates scattered atheromatous plaque without high-grade stenosis. Calcified plaque at the right bifurcation with associated stenosis of up to approximately 70% by NASCET criteria. Right ICA tortuous but widely patent distally to the skull base without stenosis, dissection, or occlusion. Left carotid system: Left common carotid artery tortuous but patent to the bifurcation without high-grade stenosis. Mixed plaque at the bifurcation/proximal left ICA with associated stenosis of up to 50% by NASCET criteria. Left ICA patent to the skull base distally without additional stenosis, dissection, or occlusion. Vertebral arteries: Both of the vertebral arteries arise from the subclavian arteries.Atheromatous plaque at the origin of the vertebral arteries bilaterally without high-grade stenosis. Vertebral arteries diffusely tortuous but patent within the neck without stenosis or occlusion. Skeleton: Severe scoliosis of the cervicothoracic spine with no definite acute osseous abnormality identified. No worrisome lytic or blastic osseous lesions. Multilevel facet arthrosis noted within the visualized spine. Other neck: Soft tissues of the neck demonstrate no acute finding. Salivary glands within normal limits. No adenopathy. Thyroid within normal limits. Upper chest: Partially visualized upper chest  demonstrates no acute abnormality. 6 mm nodule present at the mid left upper lobe (series 9, image 139). Additional 4 mm left upper lobe nodule noted (series 9, image 149). Visualized lungs are otherwise clear. Review of the MIP images confirms the above findings CTA HEAD FINDINGS Anterior circulation: Petrous segments patent bilaterally. Mild scattered atheromatous plaque within the cavernous/supraclinoid ICAs without high-grade stenosis. ICA termini widely patent. Right A1 widely patent. Left A1 patent but hypoplastic. Normal and patent anterior communicating artery. 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 This is directed superiorly. Left M1 patent without high-grade stenosis. No proximal left M2 occlusion. Distal left MCA branches well perfused. Right M1 patent to the bifurcation. There is abrupt occlusion of a proximal right M3 segment (series 13, image 66). Right MCA branches otherwise perfused distally. Posterior circulation: Focal plaque with moderate stenosis within the dominant right V4 segment. Hypoplastic left vertebral artery patent without stenosis. Basilar irregular and somewhat diminutive, but patent to its distal aspect without high-grade stenosis superior cerebral arteries grossly patent proximally. Predominant fetal type origin of the PCAs supplied via patent posterior communicating arteries. PCAs irregular but patent to their distal aspects without stenosis. Venous sinuses: Grossly patent, although not well assessed due to arterial timing of the contrast bolus. Anatomic variants: Fetal type origin of the PCAs. Delayed phase: Not performed. Review of the MIP images confirms the above findings IMPRESSION: CT HEAD IMPRESSION 1. Interval development of a 4.8 x  2.9 x 5.2 cm left parieto-occipital intraparenchymal hematoma (estimated volume 36 cc). Regional mass effect without significant midline shift. No intraventricular extension. 2. Continued interval evolution of acute right MCA  territory infarct, involving the majority of the right temporal lobe. Possible faint petechial hemorrhage without frank hemorrhagic transformation. 3. Underlying atrophy with chronic small vessel ischemic disease. CTA HEAD AND NECK IMPRESSION 1. Acute proximal right M3 occlusion, just distal to the bifurcation. 2. Atheromatous plaque about the carotid bifurcations with associated stenosis of up to 70% on the right and 50% on the left. 3. 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 4. **An incidental finding of potential clinical significance has been found. Left upper lobe pulmonary nodules measuring up to 6 mm. Non-contrast chest CT at 3-6 months is recommended. If the nodules are stable at time of repeat CT, then future CT at 18-24 months (from today's scan) is considered optional for low-risk patients, but is recommended for high-risk patients. This recommendation follows the consensus statement: Guidelines for Management of Incidental Pulmonary Nodules Detected on CT Images: From the Fleischner Society 2017; Radiology 2017; 284:228-243.** Electronically Signed   By: Jeannine Boga M.D.   On: 05/29/2018 05:09   Ct Head Wo Contrast  Result Date: 06/16/2018 CLINICAL DATA:  83 y/o  F; stroke for follow-up. EXAM: CT HEAD WITHOUT CONTRAST TECHNIQUE: Contiguous axial images were obtained from the base of the skull through the vertex without intravenous contrast. COMPARISON:  06/20/2018 CT of the head. FINDINGS: Brain: Large left parieto-occipital acute hemorrhage is stable in size from the prior CT of the head. There is increased hemorrhage within the lateral ventricles and diffuse subarachnoid hemorrhage, likely due to decompression of the brain parenchymal hematoma into the ventricular system and redistribution. Cortical hypoattenuation with loss of gray-white differentiation throughout the right MCA distribution is increased compatible with evolving infarction. Stable mild hydrocephalus. Increased edema and mass effect  associated with the right MCA infarction. Increased diffuse cerebral edema. Loss of sulcation and effacement of basilar cisterns indicating increasing intracranial pressure. No cerebellar tonsillar herniation at this time. Vascular: Calcific atherosclerosis of carotid siphons. Skull: Normal. Negative for fracture or focal lesion. Sinuses/Orbits: No acute finding. Other: None. IMPRESSION: 1. Findings of increased intracranial pressure with diffuse effacement of sulci and the basilar cisterns. No cerebellar tonsillar herniation at this time. 2. Stable mild hydrocephalus. 3. Stable large left parieto-occipital brain parenchymal hemorrhage. 4. Mildly increased intraventricular and diffuse subarachnoid hemorrhage, likely due to decompression of brain parenchymal hematoma into the ventricular system and redistribution. 5. Evolving right MCA distribution infarction with increased edema and local mass effect. These results will be called to the ordering clinician or representative by the Radiologist Assistant, and communication documented in the PACS or zVision Dashboard. Electronically Signed   By: Kristine Garbe M.D.   On: 06/16/2018 02:26   Ct Head Wo Contrast  Result Date: 05/27/2018 CLINICAL DATA:  Acute right MCA infarct status post tPA. Subsequent development of left parieto-occipital hemorrhage. Now unresponsive. EXAM: CT HEAD WITHOUT CONTRAST TECHNIQUE: Contiguous axial images were obtained from the base of the skull through the vertex without intravenous contrast. COMPARISON:  06/06/2018 FINDINGS: Brain: The left parieto-occipital hematoma has enlarged substantially and now measures 6.8 x 5.5 x 4.6 cm (approximately 86 cc). There is increasing surrounding edema with mass effect on the left lateral ventricle. There is new, relatively diffuse subarachnoid hemorrhage including throughout the basilar cisterns. There is also new diffuse intraventricular hemorrhage, moderate in volume. There is new brain  herniation with diffuse effacement of the  basilar cisterns. The brainstem is compressed with low density diffusely throughout the midbrain and pons consistent with edema/infarct. There is inferior cerebellar tonsillar herniation and uncal herniation. There is no midline shift. An acute right MCA infarct is again noted though is less conspicuous in some regions than on the prior CT which may be in part due to some cortical reperfusion, residual contrast from recent CT examinations, as well as developing diffuse cerebral edema. There is no extra-axial fluid collection. Vascular: Calcified atherosclerosis at the skull base. Skull: No fracture or focal osseous lesion. Sinuses/Orbits: Paranasal sinuses and mastoid air cells are clear. Left cataract extraction. Other: None. IMPRESSION: Enlargement of left parieto-occipital hemorrhage with worsening edema, new diffuse subarachnoid and intraventricular hemorrhage, and severe herniation. Critical Value/emergent results were called by telephone at the time of interpretation on 06/16/2018 at 10:00 am to Dr. Leonie Man, who verbally acknowledged these results. Electronically Signed   By: Logan Bores M.D.   On: 06/03/2018 10:04   Ct Angio Neck W Or Wo Contrast  Result Date: 06/06/2018 CLINICAL DATA:  Initial evaluation for acute stroke. Probable right MCA territory PE EXAM: CT ANGIOGRAPHY HEAD AND NECK TECHNIQUE: Multidetector CT imaging of the head and neck was performed using the standard protocol during bolus administration of intravenous contrast. Multiplanar CT image reconstructions and MIPs were obtained to evaluate the vascular anatomy. Carotid stenosis measurements (when applicable) are obtained utilizing NASCET criteria, using the distal internal carotid diameter as the denominator. CONTRAST:  132m ISOVUE-370 IOPAMIDOL (ISOVUE-370) INJECTION 76% COMPARISON:  Prior head CT from earlier the same day. FINDINGS: CT HEAD FINDINGS Brain: There has been interval development of  in intraparenchymal hematoma centered at the left occipital lobe, measuring 4.8 x 2.9 x 5.2 cm (36 cc). Internal fluid fluid levels. Subdural extension with small amount of blood seen along the posterior falx. Localized mass effect on the adjacent atrium of the left lateral ventricle without intraventricular extension. No midline shift. Continued interval evolution of right MCA territory infarct, now more evident as compared to previous exam. Ischemic changes involve essentially the entirety of the right temporal lobe. Possible faint petechial hemorrhage without frank hemorrhagic transformation (series 4, image 16). No significant mass effect at this time. No other new acute large vessel territory infarct. Stable ventricular size without hydrocephalus. No mass lesion. Atrophy with chronic small vessel ischemic disease again noted. Vascular: Asymmetric hyperdensity within distal right M1/M2 branches, concerning for thrombus. Calcified atherosclerosis at the skull base. Skull: No appreciable scalp soft tissue injury. Calvarium intact. Visualized osseous structures are demineralized. Sinuses: Scattered mucosal thickening within the ethmoidal air cells and maxillary sinuses. Mastoid air cells grossly clear. Orbits: Globes and orbital soft tissues demonstrate no acute finding. Patient status post ocular lens extraction on the left. Review of the MIP images confirms the above findings CTA NECK FINDINGS Aortic arch: Partially visualized aortic arch within normal limits for caliber with normal 3 vessel morphology. Extensive atheromatous plaque seen throughout the visualized arch and about the origin of the great vessels without high-grade stenosis. Partially visualized subclavian arteries patent without hemodynamically significant stenosis. Right carotid system: Right common carotid artery demonstrates scattered atheromatous plaque without high-grade stenosis. Calcified plaque at the right bifurcation with associated  stenosis of up to approximately 70% by NASCET criteria. Right ICA tortuous but widely patent distally to the skull base without stenosis, dissection, or occlusion. Left carotid system: Left common carotid artery tortuous but patent to the bifurcation without high-grade stenosis. Mixed plaque at the bifurcation/proximal left ICA with associated  stenosis of up to 50% by NASCET criteria. Left ICA patent to the skull base distally without additional stenosis, dissection, or occlusion. Vertebral arteries: Both of the vertebral arteries arise from the subclavian arteries.Atheromatous plaque at the origin of the vertebral arteries bilaterally without high-grade stenosis. Vertebral arteries diffusely tortuous but patent within the neck without stenosis or occlusion. Skeleton: Severe scoliosis of the cervicothoracic spine with no definite acute osseous abnormality identified. No worrisome lytic or blastic osseous lesions. Multilevel facet arthrosis noted within the visualized spine. Other neck: Soft tissues of the neck demonstrate no acute finding. Salivary glands within normal limits. No adenopathy. Thyroid within normal limits. Upper chest: Partially visualized upper chest demonstrates no acute abnormality. 6 mm nodule present at the mid left upper lobe (series 9, image 139). Additional 4 mm left upper lobe nodule noted (series 9, image 149). Visualized lungs are otherwise clear. Review of the MIP images confirms the above findings CTA HEAD FINDINGS Anterior circulation: Petrous segments patent bilaterally. Mild scattered atheromatous plaque within the cavernous/supraclinoid ICAs without high-grade stenosis. ICA termini widely patent. Right A1 widely patent. Left A1 patent but hypoplastic. Normal and patent anterior communicating artery. 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 This is directed superiorly. Left M1 patent without high-grade stenosis. No proximal left M2 occlusion. Distal left MCA branches  well perfused. Right M1 patent to the bifurcation. There is abrupt occlusion of a proximal right M3 segment (series 13, image 66). Right MCA branches otherwise perfused distally. Posterior circulation: Focal plaque with moderate stenosis within the dominant right V4 segment. Hypoplastic left vertebral artery patent without stenosis. Basilar irregular and somewhat diminutive, but patent to its distal aspect without high-grade stenosis superior cerebral arteries grossly patent proximally. Predominant fetal type origin of the PCAs supplied via patent posterior communicating arteries. PCAs irregular but patent to their distal aspects without stenosis. Venous sinuses: Grossly patent, although not well assessed due to arterial timing of the contrast bolus. Anatomic variants: Fetal type origin of the PCAs. Delayed phase: Not performed. Review of the MIP images confirms the above findings IMPRESSION: CT HEAD IMPRESSION 1. Interval development of a 4.8 x 2.9 x 5.2 cm left parieto-occipital intraparenchymal hematoma (estimated volume 36 cc). Regional mass effect without significant midline shift. No intraventricular extension. 2. Continued interval evolution of acute right MCA territory infarct, involving the majority of the right temporal lobe. Possible faint petechial hemorrhage without frank hemorrhagic transformation. 3. Underlying atrophy with chronic small vessel ischemic disease. CTA HEAD AND NECK IMPRESSION 1. Acute proximal right M3 occlusion, just distal to the bifurcation. 2. Atheromatous plaque about the carotid bifurcations with associated stenosis of up to 70% on the right and 50% on the left. 3. 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 4. **An incidental finding of potential clinical significance has been found. Left upper lobe pulmonary nodules measuring up to 6 mm. Non-contrast chest CT at 3-6 months is recommended. If the nodules are stable at time of repeat CT, then future CT at 18-24 months (from today's scan) is  considered optional for low-risk patients, but is recommended for high-risk patients. This recommendation follows the consensus statement: Guidelines for Management of Incidental Pulmonary Nodules Detected on CT Images: From the Fleischner Society 2017; Radiology 2017; 284:228-243.** Electronically Signed   By: Jeannine Boga M.D.   On: 06/01/2018 05:09   Dg Chest Port 1 View  Result Date: 05/24/2018 CLINICAL DATA:  History of recent stroke EXAM: PORTABLE CHEST 1 VIEW COMPARISON:  05/29/2018 FINDINGS: Cardiac shadow is accentuated by patient  rotation. Left basilar atelectasis is again noted and stable. Mild right basilar atelectasis is noted new from the prior exam. No bony abnormality is seen. IMPRESSION: Significant patient rotation to the left. Bibasilar changes are noted new on the right when compared with the recent CT. Electronically Signed   By: Inez Catalina M.D.   On: 05/22/2018 08:55   Ct Angio Chest/abd/pel For Dissection W And/or W/wo  Result Date: 06/02/2018 CLINICAL DATA:  83 year old female with aortic aneurysm. Evaluate for rupture or leak. EXAM: CT ANGIOGRAPHY CHEST, ABDOMEN AND PELVIS TECHNIQUE: Multidetector CT imaging through the chest, abdomen and pelvis was performed using the standard protocol during bolus administration of intravenous contrast. Multiplanar reconstructed images and MIPs were obtained and reviewed to evaluate the vascular anatomy. CONTRAST:  49m ISOVUE-370 IOPAMIDOL (ISOVUE-370) INJECTION 76% COMPARISON:  CT of the abdomen pelvis dated 11/07/2015. FINDINGS: Evaluation is limited due to streak artifact caused by patient's arms. CTA CHEST FINDINGS Cardiovascular: There is no cardiomegaly or pericardial effusion. Multi vessel coronary vascular calcification. There is advanced atherosclerotic calcification of the thoracic aorta. There is aneurysmal dilatation of proximal segment of the descending thoracic aorta measuring up to 4.8 cm in maximal axial diameter. There  is noncalcified plaque or mural thrombus along the posterior wall of the descending thoracic aorta. No dissection or evidence of extraluminal contrast to suggest rupture/leak. The origins of the great vessels of the aortic arch appear patent. The main pulmonary arteries are slightly prominent which may represent a degree of pulmonary hypertension. Mediastinum/Nodes: There is no hilar or mediastinal adenopathy. Esophagus is grossly unremarkable. No mediastinal fluid collection or hematoma. Lungs/Pleura: There is a focal area of consolidative change in the lingula, which may represent atelectasis or scarring. Pneumonia is less likely but not excluded. Clinical correlation is recommended. There is no pleural effusion or pneumothorax. The central airways are patent. Musculoskeletal: There is scoliosis with degenerative changes of the spine. No acute osseous pathology. Review of the MIP images confirms the above findings. CTA ABDOMEN AND PELVIS FINDINGS VASCULAR Aorta: There is advanced atherosclerotic disease of the abdominal aorta. There is focal outpouching of the anterior wall of the aorta below the origin of the celiac axis and superior to the SMA. The aorta at this level measures up to 4.1 cm in maximal axial diameter (previously 4.0 cm). There is a fusiform infrarenal abdominal aortic aneurysm measuring 6.8 cm in greatest transverse axial diameter. This aneurysm has slightly grown since the prior CT where it measured in the range of 6.1-6.7 cm axial diameter. There is large amount of mural thrombus in the periphery of this aneurysm. This aneurysm measures approximately 8.5 cm in craniocaudal length. Faint curvilinear high attenuation in the moral thrombus of this aneurysm is likely chronic. However, evaluation is limited in the absence of non-contrast images. There is no periaortic inflammatory changes or stranding or evidence of rupture. Celiac: Patent as visualized.  No aneurysm. SMA: Narrowing of the origin of  the SMA secondary to noncalcified plaque at the saccular aneurysm of the suprarenal aorta. The SMA remains patent. Renals: Multiple bilateral renal arteries which appear patent however likely narrowed at the origin secondary to extensive aortic atherosclerosis. IMA: The IMA remain patent. Inflow: Extensive atherosclerotic disease. There is thrombus in the proximal right internal iliac artery with reconstitution of flow distally. The iliac vessels are otherwise patent. Veins: No obvious venous abnormality within the limitations of this arterial phase study. Review of the MIP images confirms the above findings. NON-VASCULAR Hepatobiliary: The liver is unremarkable.  Small amount of sludge or small stones may be present within the gallbladder. No pericholecystic fluid. Pancreas: Unremarkable. No pancreatic ductal dilatation or surrounding inflammatory changes. Spleen: Normal in size without focal abnormality. Adrenals/Urinary Tract: The right adrenal gland is unremarkable. There is a 10 mm left adrenal nodule. The kidneys are atrophic. Right renal upper pole hypodense lesion similar to prior CT most likely a cyst. There is no hydronephrosis on either side. The visualized ureters appears unremarkable. The urinary bladder is partially distended around a Foley catheter. Stomach/Bowel: No bowel obstruction or active inflammation. The appendix is not visualized with certainty. No inflammatory changes identified in the right lower quadrant. Lymphatic: No adenopathy. Reproductive: The uterus and ovaries are grossly unremarkable. No pelvic mass. Other: None Musculoskeletal: Osteopenia and degenerative changes of the spine. Severe bilateral hip osteoarthritis. No acute fracture. Review of the MIP images confirms the above findings. IMPRESSION: 1. No acute intrathoracic, abdominal, or pelvic pathology. No aortic dissection. 2. Extensive atherosclerotic calcification of the thoracic and abdominal aorta. There is noncalcified  plaque or mural thrombus along the posterior wall of the descending thoracic aorta with aneurysmal dilatation of proximal segment of the descending thoracic aorta measuring up to 4.8 cm in maximal diameter. 3. 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 4. Fusiform infrarenal abdominal aortic aneurysm with large peripheral intramural clot and measuring 6.8 cm in maximal axial diameter which is slightly increased in size compared to the prior CT (previously measured 6.1-6.7 cm in axial diameter). No periaortic inflammation or evidence of leak/rupture. 5. Additional nonacute findings as above. Electronically Signed   By: Anner Crete M.D.   On: 05/29/2018 05:20   Ct Head Code Stroke Wo Contrast  Result Date: 05/26/2018 CLINICAL DATA:  Code stroke. Initial evaluation for acute altered mental status. EXAM: CT HEAD WITHOUT CONTRAST TECHNIQUE: Contiguous axial images were obtained from the base of the skull through the vertex without intravenous contrast. COMPARISON:  Prior CT from 10/01/2016. FINDINGS: Brain: Generalized age-related cerebral atrophy with chronic small vessel ischemic disease, progressed relative to most recent CT from 10/01/2016. Few scattered remote lacunar infarcts seen involving the bilateral basal ganglia. No acute intracranial hemorrhage. Loss of gray-white matter differentiation seen involving the right temporal lobe, involving the right M1, and to as well as portions of the right M3 segments. Right insula is relatively well maintained, although there may be some involvement inferiorly. Basal ganglia preserved as is the internal capsule. Supra ganglionic gray-white matter differentiation relatively well maintained as well. No mass lesion or midline shift. No hydrocephalus. No extra-axial fluid collection. Subcentimeter hyperdensity along the anterior falx slightly more prominent as compared to previous  exam, but favored to be chronic in nature. Vascular: Calcified atherosclerosis at the skull base. Asymmetric hyperdensity at the right M1 and proximal M2 segments, suspicious for large vessel occlusion. Skull: Scalp soft tissues within normal limits.  Calvarium intact. Sinuses/Orbits: Globes and orbital soft tissues demonstrate no acute finding. Patient status post ocular lens replacement on the left. Mild chronic mucoperiosteal thickening within the ethmoidal air cells and maxillary sinuses. Paranasal sinuses are otherwise clear. No mastoid effusion. Other: None. ASPECTS [facility] Stroke Program Early CT Score) - Ganglionic level infarction (caudate, lentiform nuclei, internal capsule, insula, M1-M3 cortex): 4 - Supraganglionic infarction (M4-M6 cortex): 3 Total score (0-10 with 10 being normal): 7 IMPRESSION: 1. Hyperdense right M1 segment, consistent with large vessel occlusion. Evidence for evolving right MCA ischemia  within the right temporal lobe. No acute intracranial hemorrhage. 2. ASPECTS is 7. 3. Moderately advanced cerebral atrophy with chronic small vessel ischemic disease, progressed relative to 2017. Critical Value/emergent results were called by telephone at the time of interpretation on 06/20/2018 at 2:16 am to Dr. Ezequiel Essex , who verbally acknowledged these results. Electronically Signed   By: Jeannine Boga M.D.   On: 05/24/2018 02:28    PHYSICAL EXAM : frail elderly Caucasian lady who is   . Afebrile. Head is nontraumatic. Neck is supple without bruit.    Cardiac exam no murmur or gallop. Lungs are clear to auscultation. Distal pulses are well felt. Neurological Exam :  Comatose unresponsive. Eyes are closed.  Left gaze deviation with slightly disconjugate eyes with left eye hypotropia. Pupils 3 mm irregular sluggishly reactive. Corneal reflexes are sluggish bilaterally. Fundi not visualized. Weak cough and gag. Some respiratory effort above ventilator settings.no facial weakness.  Tongue midline. Motor system exam minimum withdrawal to noxious stimuli in the left upper and lower extremity and to slightly extend the right lower extremity. No movement in the right approximately. Tone is diminishedbilaterally. Both plantars are equivocal. ASSESSMENT/PLAN Ms. [place] Holte is a 83 y.o. female with history of AAA, CAD, AF not on AC, COPD, PVD, dementia, HLD, hx PE, pituitary macroadenoma s/p resection presenting from SNF with L hemiparesis to AP. Received IV tPA. Post tPA HA. CT showed post tPA L parietal-occipital hemorrhage that was reversed w/ cryo, TXA but continued to worsen over night.  Stroke:  right MCA infarct d/p IV tPA with symptomatic post tPA L parietal/occipital hmg likely due to amyloid as not within acute stroke region. Infarct embolic secondary to known atrial fibrillation not on AC.  Code Stroke CT head 6/25 0156 hyperdense R M1. Small vessel disease. Atrophy. ASPECTS 7    CT head 6/25 0345 small L parieto-occipital IPH 36cc, mass effect w/o shift. No IVH. Evolution R MCA infarct w/ possible HT. Small vessel disease. Atrophy.   CTA head & neck 6/25 0345 R M3 occlusion. R ICA 70%, L ICA 50% stenosis. 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 LUL pulm nodules 655m(f/u 3- mos)  CTA chest 6/25 0448 no acute abnormality. Extensive atherosclerosis thoracic & abd aorta. Descending aorta plaque/mural thrombus. 4.1 saccular aneurysm suprarenal aorta. 6.8 fusiform infrarenal AAA w/ large peripheral intramural clot.  CT head 6/25 080086 (reviewed by Dr. SeLeonie Manincreased size of hmg)  MRI  pending   2D Echo  pending   LDL pending   HgbA1c pending  SCDs for VTE prophylaxis Diet Order           Diet NPO time specified  Diet effective now          No antithrombotic prior to admission, now on No antithrombotic as within 24h of tPA administration and post tPA hmg.   Therapy recommendations:  pending   Disposition:  pending   Neuro prognosis poor  Will ask palliative  care to help with goals of care. Daughter at beside, understands but thinking about what she wants. She states brother would "cause a ruckus". Will need to meet with them together.   Atrial Fibrillation  Home anticoagulation:  none   Hypertension  SBP goal < 140  On cleviprex  Hyperlipidemia  Home meds:  No statin  LDL pending, goal < 70  Other Stroke Risk Factors  Advanced age  Obesity, Body mass index is 35.58 kg/m., recommend weight loss, diet and exercise as appropriate   Other Active  Problems  CXR - R basilar changes  Hospital day # 1   I have personally examined this patient, reviewed notes, independently viewed imaging studies, participated in medical decision making and plan of care.ROS completed by me personally and pertinent positives fully documented  I have made any additions or clarifications directly to the above note. .  The patient presented to an outside hospital with right MCA infarct and was given IV TPA but unfortunately had neurological worsening in route and on arrival CT scan showed left occipital hemorrhage. She had neurological as well as radiological worsening despite reversal of tPA and neurological exam is quite poor and patient's prognosis for survival and making meaningful recovery is extremely slim. I had a long discussion with the patient's daughter as well as son who arrived later about her poor prognosis. Family agrees to DO NOT RESUSCITATE  And full comfort care. Patient will be transitioned to morphine drip for comfort and transferred to hospice unit. Long discussion with this daughter and son and answered questions.This patient is critically ill and at significant risk of neurological worsening, death and care requires constant monitoring of vital signs, hemodynamics,respiratory and cardiac monitoring, extensive review of multiple databases, frequent neurological assessment, discussion with family, other specialists and medical decision making of  high complexity.I have made any additions or clarifications directly to the above note.This critical care time does not reflect procedure time, or teaching time or supervisory time of PA/NP/Med Resident etc but could involve care discussion time.  I spent 30 minutes of neurocritical care time  in the care of  this patient.      Antony Contras, MD Medical Director [facility] Stroke Center Pager: [phone] 06/16/2018 2:49 PM  To contact Stroke Continuity provider, please refer to [email or web address] After hours, contact General Neurology

## 2018-06-16 NOTE — Consult Note (Signed)
Consultation Note Date: 06/16/2018   Patient Name: [name] [name]  DOB: 23-May-1935  MRN: [id number]  Age / Sex: 83 y.o., female  PCP: Cleda Mccreedy, MD Referring Physician: Garvin Fila, MD  Reason for Consultation: Hospice Evaluation, Non pain symptom management, Pain control and Psychosocial/spiritual support  HPI/Patient Profile: 83 y.o. female  with past medical history of DJD, PVD, renal failure stg 3, PE, Dementia, CAD, Afib, COPD, multiple aortic aneurysms (largest is 6.8 cm) who was admitted on 06/08/2018 with left sided weakness.  She was found to have a right MCA stroke.  Subsequently she developed a large left sided intracranial hemorrhage.  She was admitted by the stroke team.  Palliative has been consulted for end of life symptom management and options.   Clinical Assessment and Goals of Care:  I have reviewed medical records including EPIC notes, labs and imaging, received report from the care team, assessed the patient and then met at the bedside along with her two children and grand daughter [name], [place], and Tim)  to discuss diagnosis prognosis, EOL wishes, disposition and options.  I introduced Palliative Medicine as specialized medical care for people living with serious illness. It focuses on providing relief from the symptoms and stress of a serious illness. The goal is to improve quality of life for both the patient and the family.  We discussed a brief life review of the patient. She farmed tobacco and worked in Engineer, structural.  She was a very Scientist, research (physical sciences).  Her husband died young and she raised her two children as a single mother.  We discussed her love of music [employer] [place]). She is Psychologist, forensic.   For the last two years she has lived at [facility].  She had chronic right sided hip and low back pain.  She was on vicodin and xanax at [place].  We will increase her morphine  and add valium to ensure she does not go into benzodiazepine withdraw.  Hospice and Palliative Care services outpatient were explained and offered.  If she stabilizes the family would like a bed at [facility].  Questions and concerns were addressed. The family was encouraged to call with questions or concerns.    Primary Decision Maker:  NEXT OF KIN  Lori daughter.    SUMMARY OF RECOMMENDATIONS     Add valium IV.  Patient was on scheduled xanax 2-4x a day.  She reacts very badly to ativan  Increase morphine - patient has labored breathing.  Will add boluses PRN  Add scope patch for secretions as well as PRN robinul  If she looks stable tomorrow we can transfer to hospice house - family would really appreciate [facility] if there is a bed.  (daughter, [name],  used to sit with patient's at [facility]).  Chaplain consulted - [place] denomination.  PMT will continue to follow with you.     Code Status/Advance Care Planning:  DNR   Additional Recommendations (Limitations, Scope, Preferences):  Full Comfort Care  Palliative Prophylaxis:   Delirium Protocol  Psycho-social/Spiritual:   Desire for further Chaplaincy support: yes  Prognosis:  Hours to days secondary to Rt MCA ischemic stroke and large ICH.  Discharge Planning: Anticipated Hospital Death vs Hospice House if she stabilizes.      Primary Diagnoses: Present on Admission: . Stroke (cerebrum) ([place]) . Acute ischemic stroke ([place])   I have reviewed the medical record, interviewed the patient and family, and examined the patient. The following aspects are pertinent.  Past Medical History:  Diagnosis Date  . AAA (abdominal aortic aneurysm) ([place])    infrarenal; 07/2012: Diameter of 5.4 cm  . AAA (abdominal aortic aneurysm) ([place])   . Anemia   . Arteriosclerotic cardiovascular disease (ASCVD)    Cardiac cath in 03/2000->  80% LAD, 70-80% RCA;  stent x1 in the LAD and x2 in the RCA with excellent  angiographic outcome; repeat catheterization in late 2001 and 2003 revealed no restenosis; EF-50%  . Atrial arrhythmia    Atrial fibrillation and atrial flutter diagnosed in the past;?  PSVT  . Chronic kidney disease (CKD) stage G3a/A1, moderately decreased glomerular filtration rate (GFR) between 45-59 mL/min/1.73 square meter and albuminuria creatinine ratio less than 30 mg/g 05/25/2013  . COPD (chronic obstructive pulmonary disease) ([place])    multiple hospitalizations for exacerbations with acute bronchitis  . Coronary artery disease   . Degenerative joint disease    Bilateral hips  . Dementia   . Depression with anxiety   . DJD (degenerative joint disease)   . Encephalopathy   . Gall stones   . GERD (gastroesophageal reflux disease)    Hiatal hernia  . Hyperlipidemia   . Hyperlipidemia   . Hypernatremia   . Hypertension   . Hypokalemia   . Ileus ([place])   . Nephrolithiasis   . Pituitary microadenoma ([place])    transsphenoidal resection in 09/2007  . Pulmonary embolism ([place]) 08/14/2012  . PVD (peripheral vascular disease) ([place])   . Renal failure   . SBO (small bowel obstruction)   . Tobacco abuse, in remission    Quit in 2006; total consumption of 50-75 pack years   Social History   Socioeconomic History  . Marital status: Widowed    Spouse name: Not on file  . Number of children: Not on file  . Years of education: Not on file  . Highest education level: Not on file  Occupational History  . Not on file  Social Needs  . Financial resource strain: Not on file  . Food insecurity:    Worry: Not on file    Inability: Not on file  . Transportation needs:    Medical: Not on file    Non-medical: Not on file  Tobacco Use  . Smoking status: Former Smoker    Packs/day: 1.50    Years: 50.00    Pack years: 75.00    Types: Cigarettes    Last attempt to quit: 05/27/2005    Years since quitting: 13.0  . Smokeless tobacco: Former Systems developer    Types: Snuff    Quit date: 01/11/2008    Substance and Sexual Activity  . Alcohol use: No    Alcohol/week: 0.0 oz  . Drug use: No  . Sexual activity: Not Currently  Lifestyle  . Physical activity:    Days per week: Not on file    Minutes per session: Not on file  . Stress: Not on file  Relationships  . Social connections:    Talks on phone: Not on file    Gets together:  Not on file    Attends religious service: Not on file    Active member of club or organization: Not on file    Attends meetings of clubs or organizations: Not on file    Relationship status: Not on file  Other Topics Concern  . Not on file  Social History Narrative  . Not on file   Family History  Problem Relation Age of Onset  . Heart disease Mother   . Hyperlipidemia Mother   . Hypertension Mother   . Stroke Mother   . Cancer Brother    Scheduled Meds: . chlorhexidine  15 mL Mouth Rinse BID  . diazepam  5 mg Intravenous BID  . mouth rinse  15 mL Mouth Rinse q12n4p  . MUSCLE RUB   Topical BID  . scopolamine  1 patch Transdermal Q72H  . sodium chloride flush  3 mL Intravenous Q12H   Continuous Infusions: . sodium chloride    . morphine 1 mg/hr (06/16/18 1438)  . morphine     PRN Meds:.sodium chloride, [DISCONTINUED] acetaminophen **OR** [DISCONTINUED] acetaminophen (TYLENOL) oral liquid 160 mg/5 mL **OR** acetaminophen, acetaminophen **OR** acetaminophen, glycopyrrolate **OR** glycopyrrolate **OR** glycopyrrolate, haloperidol **OR** haloperidol **OR** haloperidol lactate, LORazepam, morphine, morphine CONCENTRATE **OR** morphine CONCENTRATE, ondansetron **OR** ondansetron (ZOFRAN) IV, polyvinyl alcohol, polyvinyl alcohol, sodium chloride flush Allergies  Allergen Reactions  . Zoloft [Sertraline Hcl] Anxiety  . Asa [Aspirin] Other (See Comments)    "my ears bleed"  . Lidocaine Other (See Comments)    unknown  . Sulfonamide Derivatives Other (See Comments)    unknown  . Ativan [Lorazepam] Anxiety    Per daughter   Review of Systems   Patient is not reponsive.  Physical Exam  Obese elderly female, sleeping soundly.  Increased work of breathing. CV rrr resp increased work of breathing   Vital Signs: BP 132/66 (BP Location: Left Arm)   Pulse 70   Temp 98.6 F (37 C) (Oral)   Resp (!) 36   Wt 91.1 kg (200 lb 13.4 oz)   SpO2 (!) 88%   BMI 35.58 kg/m  Pain Scale: 0-10   Pain Score: Asleep   SpO2: SpO2: (!) 88 % O2 Device:SpO2: (!) 88 % O2 Flow Rate: .O2 Flow Rate (L/min): 2.5 L/min  IO: Intake/output summary:   Intake/Output Summary (Last 24 hours) at 06/16/2018 1506 Last data filed at 06/16/2018 1000 Gross per 24 hour  Intake 2098.06 ml  Output 400 ml  Net 1698.06 ml    LBM:   Baseline Weight: Weight: 93.4 kg (205 lb 14.6 oz) Most recent weight: Weight: 91.1 kg (200 lb 13.4 oz)     Palliative Assessment/Data: 10%     Time In: 2:30 Time Out: 3:25 Time Total: 55 min Greater than 50%  of this time was spent counseling and coordinating care related to the above assessment and plan.  Signed by: Florentina Jenny, PA-C Palliative Medicine Pager: [phone]  Please contact Palliative Medicine Team phone at [phone] for questions and concerns.  For individual provider: See Shea Evans

## 2018-06-16 NOTE — Progress Notes (Signed)
No charge note  Palliative meeting at 2:30.  Florentina Jenny, PA-C Palliative Medicine Pager: [phone]

## 2018-06-17 DIAGNOSIS — Z515 Encounter for palliative care: Secondary | ICD-10-CM

## 2018-06-17 MED ORDER — GLYCOPYRROLATE 0.2 MG/ML IJ SOLN: 0.6000 mg | Freq: Three times a day (TID) | INTRAMUSCULAR | Status: DC

## 2018-06-17 MED ORDER — GLYCOPYRROLATE 0.2 MG/ML IJ SOLN: 0.6000 mg | Freq: Four times a day (QID) | INTRAMUSCULAR | Status: DC

## 2018-06-17 MED ORDER — MORPHINE BOLUS VIA INFUSION
2.0000 mg | INTRAVENOUS | Status: DC | PRN
  Administered 2018-06-17 (×3): 4 mg via INTRAVENOUS
  Filled 2018-06-17: qty 4

## 2018-06-18 MED FILL — Morphine Sulfate-Sodium Chloride 0.9% IV Soln 100 MG/100ML: INTRAVENOUS | Qty: 100 | Status: AC

## 2018-06-21 NOTE — Progress Notes (Signed)
STROKE TEAM PROGRESS NOTE    INTERVAL HISTORY Her daughter is at the bedside.  The patient is on morphine drip and is lying down peacefully but is having mild respiratory distress. Her pulse is quite febrile and she looks preterminal Vitals:   06/16/18 1000 06/16/18 1200 06/16/18 1316 June 19, 2018 0511  BP: 132/71 132/66  (!) 93/57  Pulse: 69 70  (!) 103  Resp: (!) 21 (!) 36  (!) 23  Temp:  98.6 F (37 C)  100.2 F (37.9 C)  TempSrc:  Oral  Oral  SpO2: 92% (!) 81% (!) 88% (!) 73%  Weight:        CBC:  Recent Labs  Lab 05/22/2018 0210 05/22/2018 0223  WBC 10.6*  --   NEUTROABS 6.5  --   HGB 12.1 10.9*  HCT 39.5 32.0*  MCV 91.4  --   PLT 164  --     Basic Metabolic Panel:  Recent Labs  Lab 06/03/2018 0210 05/29/2018 0223  NA 139 141  K 4.0 3.9  CL 104 105  CO2 25  --   GLUCOSE 102* 96  BUN 43* 37*  CREATININE 1.60* 1.50*  CALCIUM 8.7*  --    Lipid Panel:     Component Value Date/Time   CHOL 230 (H) 06/18/2018 0920   TRIG 476 (H) 05/23/2018 0920   HDL 38 (L) 06/10/2018 0920   CHOLHDL 6.1 06/20/2018 0920   VLDL UNABLE TO CALCULATE IF TRIGLYCERIDE OVER 400 mg/dL 06/12/2018 0920   LDLCALC UNABLE TO CALCULATE IF TRIGLYCERIDE OVER 400 mg/dL 05/27/2018 0920   HgbA1c:  Lab Results  Component Value Date   HGBA1C 5.7 (H) 06/11/2018   Urine Drug Screen:     Component Value Date/Time   LABOPIA POSITIVE (A) 06/08/2018 0226   COCAINSCRNUR NONE DETECTED 06/04/2018 0226   LABBENZ POSITIVE (A) 05/31/2018 0226   AMPHETMU NONE DETECTED 06/13/2018 0226   THCU NONE DETECTED 05/24/2018 0226   LABBARB (A) 06/11/2018 0226    Result not available. Reagent lot number recalled by manufacturer.    Alcohol Level     Component Value Date/Time   ETH <10 06/11/2018 0210    IMAGING Ct Head Wo Contrast  Result Date: 06/16/2018 CLINICAL DATA:  83 y/o  F; stroke for follow-up. EXAM: CT HEAD WITHOUT CONTRAST TECHNIQUE: Contiguous axial images were obtained from the base of the skull  through the vertex without intravenous contrast. COMPARISON:  05/22/2018 CT of the head. FINDINGS: Brain: Large left parieto-occipital acute hemorrhage is stable in size from the prior CT of the head. There is increased hemorrhage within the lateral ventricles and diffuse subarachnoid hemorrhage, likely due to decompression of the brain parenchymal hematoma into the ventricular system and redistribution. Cortical hypoattenuation with loss of gray-white differentiation throughout the right MCA distribution is increased compatible with evolving infarction. Stable mild hydrocephalus. Increased edema and mass effect associated with the right MCA infarction. Increased diffuse cerebral edema. Loss of sulcation and effacement of basilar cisterns indicating increasing intracranial pressure. No cerebellar tonsillar herniation at this time. Vascular: Calcific atherosclerosis of carotid siphons. Skull: Normal. Negative for fracture or focal lesion. Sinuses/Orbits: No acute finding. Other: None. IMPRESSION: 1. Findings of increased intracranial pressure with diffuse effacement of sulci and the basilar cisterns. No cerebellar tonsillar herniation at this time. 2. Stable mild hydrocephalus. 3. Stable large left parieto-occipital brain parenchymal hemorrhage. 4. Mildly increased intraventricular and diffuse subarachnoid hemorrhage, likely due to decompression of brain parenchymal hematoma into the ventricular system and redistribution. 5. Evolving  right MCA distribution infarction with increased edema and local mass effect. These results will be called to the ordering clinician or representative by the Radiologist Assistant, and communication documented in the PACS or zVision Dashboard. Electronically Signed   By: Kristine Garbe M.D.   On: 06/16/2018 02:26    PHYSICAL EXAM : frail elderly Caucasian lady who is   . Afebrile. Head is nontraumatic. Neck is supple without bruit.    Cardiac exam no murmur or gallop. Lungs  are clear to auscultation. Distal pulses are well felt. Neurological Exam :  Comatose unresponsive. Eyes are closed.  Left gaze deviation with slightly disconjugate eyes with left eye hypotropia. Pupils 3 mm irregular sluggishly reactive. Corneal reflexes are sluggish bilaterally. Fundi not visualized. Weak cough and gag. Some respiratory effort above ventilator settings.no facial weakness. Tongue midline. Motor system exam minimum withdrawal to noxious stimuli in the left upper and lower extremity and to slightly extend the right lower extremity. No movement in the right approximately. Tone is diminishedbilaterally. Both plantars are equivocal. ASSESSMENT/PLAN Ms. [place]tar Silverthorne is a 83 y.o. female with history of AAA, CAD, AF not on AC, COPD, PVD, dementia, HLD, hx PE, pituitary macroadenoma s/p resection presenting from SNF with L hemiparesis to AP. Received IV tPA. Post tPA HA. CT showed post tPA L parietal-occipital hemorrhage that was reversed w/ cryo, TXA but continued to worsen over night.  Stroke:  right MCA infarct d/p IV tPA with symptomatic post tPA L parietal/occipital hmg likely due to amyloid as not within acute stroke region. Infarct embolic secondary to known atrial fibrillation not on AC.  Code Stroke CT head 6/25 0156 hyperdense R M1. Small vessel disease. Atrophy. ASPECTS 7    CT head 6/25 0345 small L parieto-occipital IPH 36cc, mass effect w/o shift. No IVH. Evolution R MCA infarct w/ possible HT. Small vessel disease. Atrophy.   CTA head & neck 6/25 0345 R M3 occlusion. R ICA 70%, L ICA 50% stenosis. 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 LUL pulm nodules 32mm (f/u 3- mos)  CTA chest 6/25 0448 no acute abnormality. Extensive atherosclerosis thoracic & abd aorta. Descending aorta plaque/mural thrombus. 4.1 saccular aneurysm suprarenal aorta. 6.8 fusiform infrarenal AAA w/ large peripheral intramural clot.  CT head 6/25 4970   (reviewed by Dr. Leonie Man, increased size of hmg)  MRI  pending    2D Echo  pending   LDL pending   HgbA1c pending  SCDs for VTE prophylaxis Diet Order           Diet NPO time specified  Diet effective now          No antithrombotic prior to admission, now on No antithrombotic as within 24h of tPA administration and post tPA hmg.   Therapy recommendations:  pending   Disposition:  pending   Neuro prognosis poor  Will ask palliative care to help with goals of care. Daughter at beside, understands but thinking about what she wants. She states brother would "cause a ruckus". Will need to meet with them together.   Atrial Fibrillation  Home anticoagulation:  none   Hypertension  SBP goal < 140  On cleviprex  Hyperlipidemia  Home meds:  No statin  LDL pending, goal < 70  Other Stroke Risk Factors  Advanced age  Obesity, Body mass index is 35.58 kg/m., recommend weight loss, diet and exercise as appropriate   Other Active Problems  CXR - R basilar changes  Hospital day # 2     I had a long  discussion with the patient's daughter  about her poor prognosis. Family agrees to DO NOT RESUSCITATE  And full comfort care. Patient will  Stay on morphine drip for comfort and likely pass away soon and if not may consider transfer  to hospice nursing home after a few days.   Antony Contras, MD Medical Director [facility] Pager: [phone] July 06, 2018 1:38 PM  To contact Stroke Continuity provider, please refer to [email or web address] After hours, contact General Neurology

## 2018-06-21 NOTE — Progress Notes (Signed)
Daily Progress Note   Patient Name: [name] [name]       Date: 06-29-18 DOB: 31-Jan-1935  Age: 83 y.o. MRN#: [id number] Attending Physician: Garvin Fila, MD Primary Care Physician: Cleda Mccreedy, MD Admit Date: 06/09/2018  Reason for Consultation/Follow-up: Establishing goals of care, Psychosocial/spiritual support and Terminal Care  Subjective: Per daughter at bedside - patient's breathing changed to gasping last night.  Her oxygen sats dropped to 60%.  Daughter requested oxygen be turned up as patient was uncomfortable.   Talked with [name] [name] (Dtr) about the dying process and explained that when she has difficulty breathing we ease her discomfort with morphine rather than prolonging the dying process by turning up the oxygen.  [name] [name] agrees.    Will stop checking oxygen sats and gradually wean off oxygen today.   Daughter agrees.    Assessment: Actively dying after large ischemic stroke and large IC hemorrhage.   Patient Profile/HPI:  83 y.o. female  with past medical history of DJD, PVD, renal failure stg 3, PE, Dementia, CAD, Afib, COPD, multiple aortic aneurysms (largest is 6.8 cm) who was admitted on 06/10/2018 with left sided weakness.  She was found to have a right MCA stroke.  Subsequently she developed a large left sided intracranial hemorrhage.  She was admitted by the stroke team.  Palliative has been consulted for end of life symptom management and options.   Length of Stay: 2  Current Medications: Scheduled Meds:  . chlorhexidine  15 mL Mouth Rinse BID  . diazepam  5 mg Intravenous BID  . mouth rinse  15 mL Mouth Rinse q12n4p  . MUSCLE RUB   Topical BID  . scopolamine  1 patch Transdermal Q72H  . sodium chloride flush  3 mL Intravenous Q12H    Continuous Infusions: .  sodium chloride 100 mL (06/16/18 2218)  . morphine 3 mg/hr (06-29-18 8592)    PRN Meds: sodium chloride, acetaminophen **OR** acetaminophen, glycopyrrolate **OR** glycopyrrolate **OR** glycopyrrolate, haloperidol **OR** haloperidol **OR** haloperidol lactate, morphine, morphine CONCENTRATE **OR** morphine CONCENTRATE, ondansetron **OR** ondansetron (ZOFRAN) IV, polyvinyl alcohol, sodium chloride flush  Physical Exam       Well developed elderly female, appears as though she is sleeping comfortably. CV  Tachy Resp no distress Abdomin soft  Vital Signs: BP (!) 93/57 (BP Location: Left Arm)   Pulse Marland Kitchen)  103   Temp 100.2 F (37.9 C) (Oral)   Resp (!) 23   Wt 91.1 kg (200 lb 13.4 oz)   SpO2 (!) 73%   BMI 35.58 kg/m  SpO2: SpO2: (!) 73 % O2 Device: O2 Device: Room Air O2 Flow Rate: O2 Flow Rate (L/min): 2.5 L/min  Intake/output summary:   Intake/Output Summary (Last 24 hours) at 06-25-18 0900 Last data filed at 06-25-18 9381 Gross per 24 hour  Intake 161.68 ml  Output 550 ml  Net -388.32 ml   LBM:   Baseline Weight: Weight: 93.4 kg (205 lb 14.6 oz) Most recent weight: Weight: 91.1 kg (200 lb 13.4 oz)       Palliative Assessment/Data: 10%     Patient Active Problem List   Diagnosis Date Noted  . Palliative care encounter   . Comfort measures only status   . Encounter for hospice care discussion   . Stroke (cerebrum) ([place]) 06/06/2018  . Acute ischemic stroke ([place]) 05/26/2018  . Dementia with behavioral disturbance 10/01/2016  . Delirium 10/01/2016  . Visual hallucinations 10/01/2016  . Chronic anxiety 10/01/2016  . Abdominal aneurysm without mention of rupture 06/01/2014  . Abdominal pain, left lower quadrant 06/01/2014  . Failure to thrive 02/02/2014  . Generalized weakness 02/02/2014  . Gait difficulty 02/02/2014  . Confusion 02/02/2014  . Low back pain 02/02/2014  . Chest pressure 08/10/2013  . Abdominal pressure 08/10/2013  . Appendicitis, acute  05/28/2013  . Bradycardia 05/28/2013  . Chronic kidney disease (CKD) stage G3a/A1, moderately decreased glomerular filtration rate (GFR) between 45-59 mL/min/1.73 square meter and albuminuria creatinine ratio less than 30 mg/g (HCC) 05/25/2013  . Abdominal pain 05/20/2013  . Nausea and vomiting 05/20/2013  . UTI (urinary tract infection) 05/20/2013  . Renal insufficiency 05/20/2013  . AAA (abdominal aortic aneurysm) without rupture ([place]) 02/09/2013  . RUQ abdominal pain 12/28/2012  . Failure to thrive in adult 12/28/2012  . Dehydration 12/28/2012  . Hypokalemia 12/28/2012  . Small bowel obstruction ([place]) 09/02/2012  . Anemia 09/01/2012  . Peripheral vascular disease ([place]) 09/01/2012  . Essential hypertension   . COPD (chronic obstructive pulmonary disease) ([place])   . Atrial arrhythmia   . AAA (abdominal aortic aneurysm) ([place])   . Arteriosclerotic cardiovascular disease (ASCVD)   . Tobacco abuse, in remission   . Pituitary microadenoma ([place])   . Acute encephalopathy 08/14/2012  . Pulmonary embolism ([place]) 08/14/2012  . Hyperlipidemia 05/24/2012  . ARTHRITIS, HIPS, BILATERAL 06/06/2010    Palliative Care Plan    Recommendations/Plan:  Actively dying. Unable to transfer to [facility]  Will gradually wean off oxygen using morphine boluses.   No further checking of oxygen sats.  I anticipate she will pass away within 24 hours (possibly much less) when the oxygen is weaned off.  Goals of Care and Additional Recommendations:  Limitations on Scope of Treatment: Full Comfort Care  Code Status:  DNR  Prognosis:   Hours - Days   Discharge Planning:  Anticipated Hospital Death  Care plan was discussed with daughter and bedside RN  Thank you for allowing the Palliative Medicine Team to assist in the care of this patient.  Total time spent:  35 min.     Greater than 50%  of this time was spent counseling and coordinating care related to the above assessment and  plan.  Florentina Jenny, PA-C Palliative Medicine  Please contact Palliative MedicineTeam phone at [phone] for questions and concerns between 7 am - 7 pm.   Please  see AMION for individual provider pager numbers.

## 2018-06-21 NOTE — Progress Notes (Signed)
Chaplain received notification of the patient's death, and a request for emotional and spiritual care support for the family. The family was present around the bedside, Chaplain provided  the ministry of presence as they shared life stories about the patient. A prayer of peace, comfort was offered for the family. Thee family was given a Patient Placement Card for follow up on a  funeral home decision, and family contact information was given to the assigned Nurse. Chaplain Yaakov Guthrie [phone]

## 2018-06-21 NOTE — Progress Notes (Signed)
Nutrition Brief Note  Chart reviewed. Pt now transitioning to comfort care.  No further nutrition interventions warranted at this time.  Please re-consult as needed.   Caytlin Better A. Shamona Wirtz, RD, LDN, CDE Pager: [phone] After hours Pager: [phone]  

## 2018-06-21 NOTE — Progress Notes (Signed)
This RN wasted 12 mL of Morphine with Vinnie Level, Therapist, sports.

## 2018-06-21 NOTE — Discharge Summary (Signed)
Patient ID: Gib[place]tar Poorman MRN: [id number] DOB/AGE: 07-31-1935 83 y.o.  Admit date: 30-Jun-2018 Death date: 02-Jul-2018 1510 hrs  Admission Diagnoses:stroke  Cause of Death:  Respiratory failure due to increased intracranial pressure secondary to herniation,hydrocephalus and cytotoxic edema fromlarge left parieto-occipital and intraventricular hemorrhage status post IV TPA far stroke.Patient made DO NOT RESUSCITATE and comfort care by family  Pertinent Medical Diagnosis: Active Problems:   Stroke (cerebrum) ([place])   Acute ischemic stroke [facility])   Palliative care encounter   Comfort measures only status   Encounter for hospice care discussion   Terminal care   Hospital Course: [place]tar Pacholski is a 83 y.o. female  Past medical history of AAA, coronary artery disease,?  Atrial fibrillation/PSVT, COPD, dementia, anxiety and depression, hyperlipidemia, pituitary microadenoma status post transsphenoidal resection 12/31/2006, history of pulmonary embolism, peripheral vascular disease, presented to [facility] from a nursing facility with a last known normal of 11:45 PM on 06/14/2018, within window for IV TPA with left-sided weakness.  She was evaluated by the emergency room physicians and telemedicine neurology specialist, And found to have a NIH stroke scale of 26 consistent with right MCA territory stroke.  Noncontrast CT of the head did not show any evidence of bleed but showed a possible dense right MCA and patchy areas of evolving right MCA infarct with an aspect score of 7.  Per my conversation with the telemedicine neurologist and review of notes, extensive discussion with patient's daughter about the risks and benefits of IV TPA were held and finally IV TPA was recommended and ordered. Patient was then transferred to [facility] for further management and for consideration for endovascular treatment. When I received a call for transfer, I had specifically asked about the modified  Rankin score which was 4-5, and precluded the patient from any intervention. Patient arrived at [facility], and I met them on route to the neuro ICU in the emergency room.  Patient had been complaining of headache on the way.  She was taken in for a stat noncontrast CT of the head as well as a CTA.  Noncontrast CT of the head showed large left-sided intraparenchymal hematoma.  Approximately 34 cc. The CT angios head and neck showed possible right M3 occlusion. CT angios chest abdomen pelvis for dissection was also done that did not show any acute intrathoracic, abdominal pelvic pathology or dissection.  Extensive atherosclerotic calcification of the thoracic and abdominal aorta was seen. Noncalcified plaque or mural thrombus along the posterior wall of the descending thoracic aorta with aneurysmal dilatation of proximal segment of the descending thoracic aorta measuring up to 4.8 cm seen.  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  Fusiform infrarenal abdominal aortic aneurysm with large peripheral intramural clot measuring 6.8 cm, slightly increased in size to the prior CT.  No evidence of leak or rupture.   LKW: 11:45 PM on 06/14/2018 tpa given?:  [facility] Premorbid modified Rankin scale (mRS):4 ICH Score: 3  Patient was administered IV tPA at outside hospital after consultation with telemetry medicine neurologist and accepted for transfer to [facility]. Patient started complaining of headache on the way to [facility] and repeat CT scan on that upon arrival showed intraparenchymal hemorrhage in the left parietal-occipital area with greater than 30 mL cubic volume as well as ICH score of 3. TPA was reversed using cryoprecipitate and tranxemic  acid however patient's condition remained quite poor  overnight. Repeat CT scan the next day showed increase in hemorrhage with significant intraventricular  extension and the lip and of hydrocephalus. Patient's neurological exam is quite poor. After discussion with the patient's daughter she understood the poor prognosis and made the patient DO NOT RESUSCITATE. She waited for the arrival of first brother and subsequently after several meetings with them they both agree to comfort care measures only. Patient was kept comfortable and transferred to the hospice floor. Palliative care team was also consulted and spoke to the family. Patient passed away peacefully on 2018-07-01 at 1510 hrs.    Signed: Antony Contras 01-Jul-2018, 5:20 PM

## 2018-06-21 DEATH — deceased

## 2019-07-27 IMAGING — CT CT HEAD W/O CM
4 series · 15 of 47 positions shown, 17 images · non-contrast
Comparison: 06/15/2018

CLINICAL DATA: Acute right MCA infarct status post tPA. Subsequent
development of left parieto-occipital hemorrhage. Now unresponsive.

EXAM:
CT HEAD WITHOUT CONTRAST
TECHNIQUE: Contiguous axial images were obtained from the base of the skull
through the vertex without intravenous contrast.

[Series 3: head without · axial · non-contrast · 0.42mm/px · z∈[-84,+41]mm · 7 of 35 slices shown, 9 images]
[im 5/35  brain]
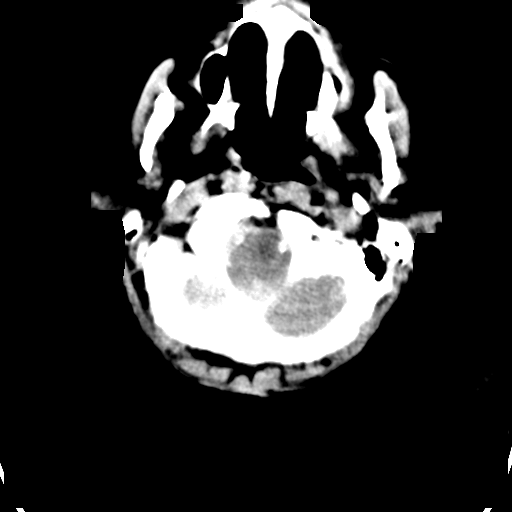
[im 5/35  bone]
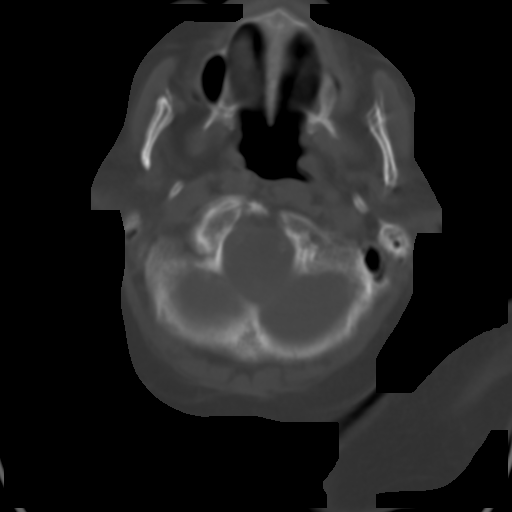
[im 9/35  brain]
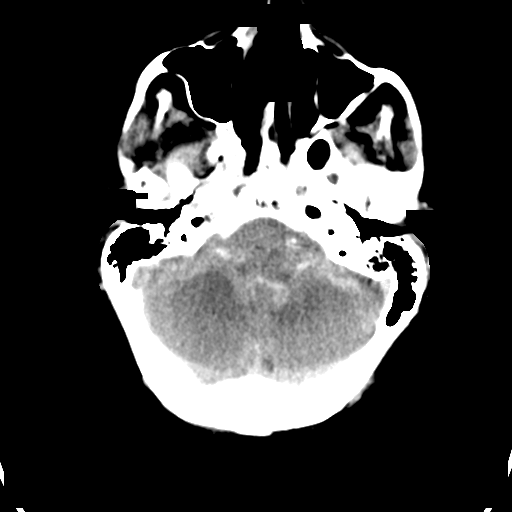
[im 13/35  brain]
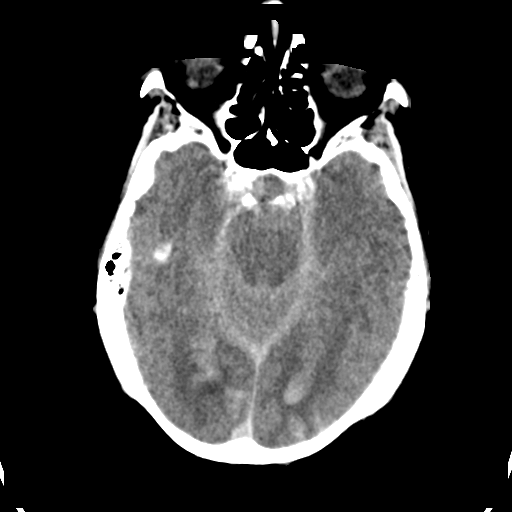
[im 18/35  brain]
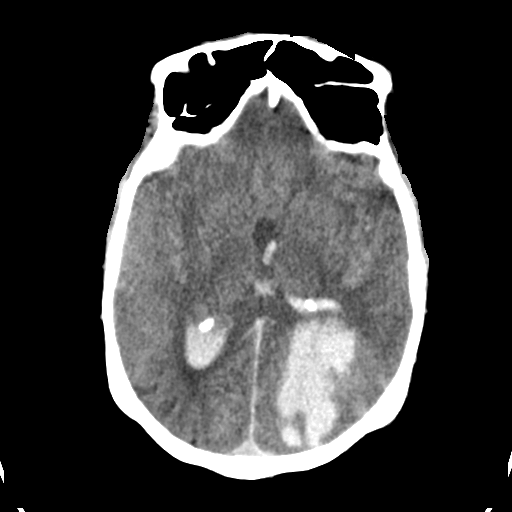
[im 22/35  brain]
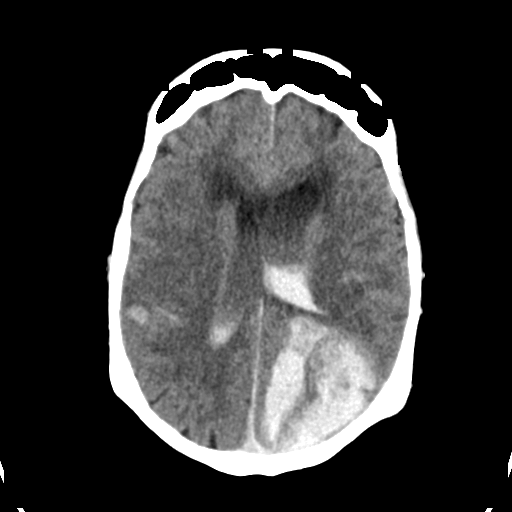
[im 22/35  bone]
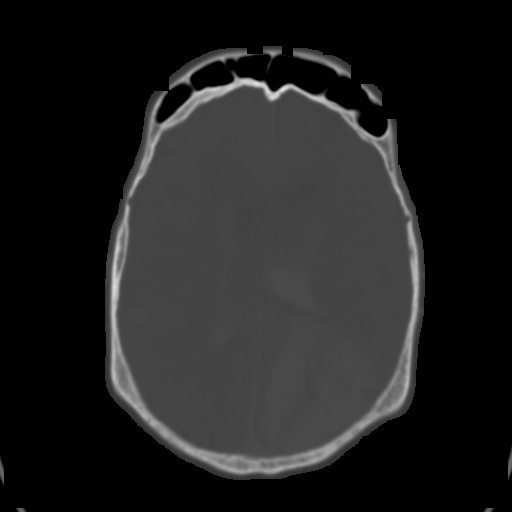
[im 26/35  brain]
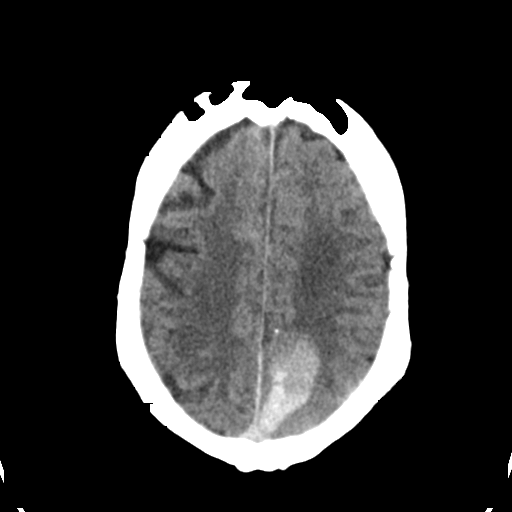
[im 30/35  brain]
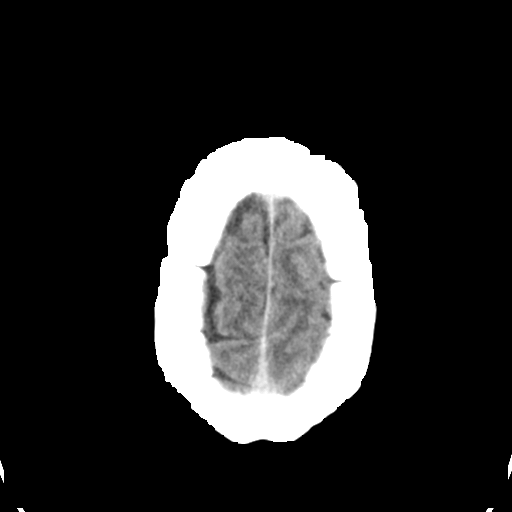

[Series 4: head bone · axial · 0.42mm/px · z∈[-88,-70]mm · 2 of 86 slices shown]
[im 9/86  bone]
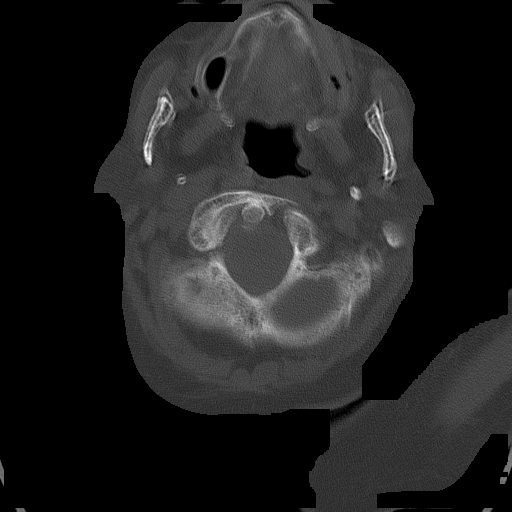
[im 18/86  bone]
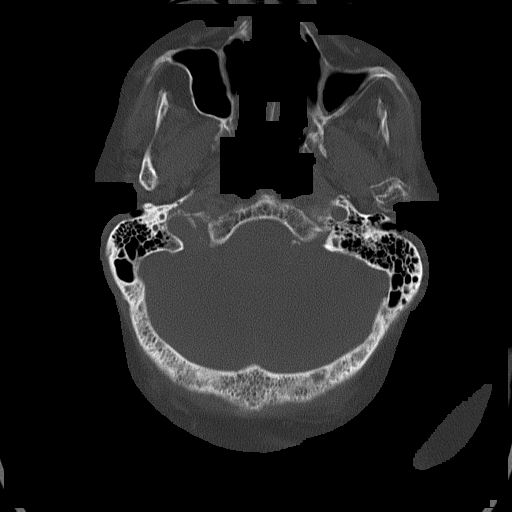

[Series 5: head without cor · coronal · non-contrast · 0.29mm/px · 3 of 71 slices shown]
[im 24/71  brain]
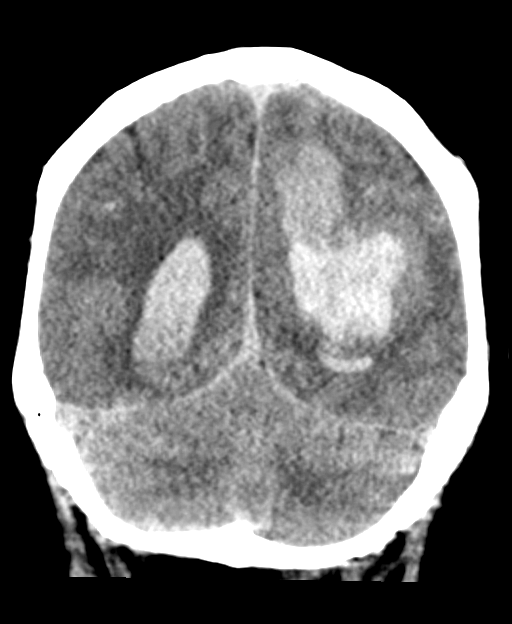
[im 32/71  brain]
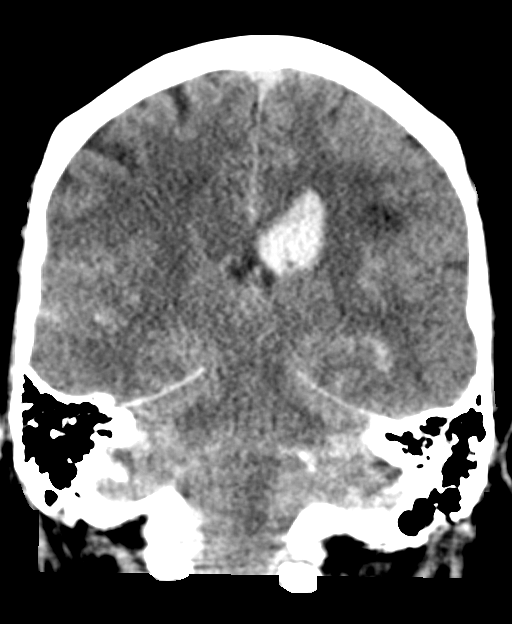
[im 39/71  brain]
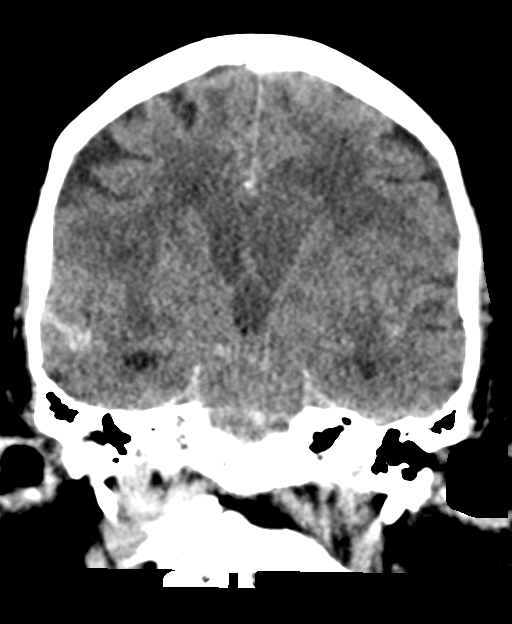

[Series 6: head without sag · sagittal · non-contrast · 0.34mm/px · 3 of 52 slices shown]
[im 18/52  brain]
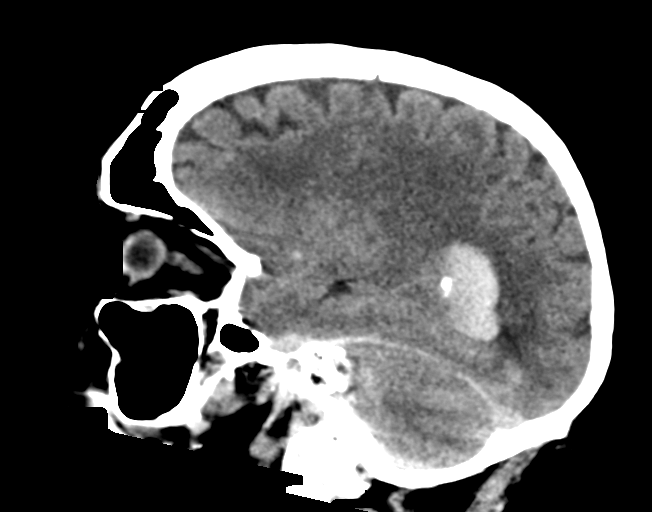
[im 26/52  brain]
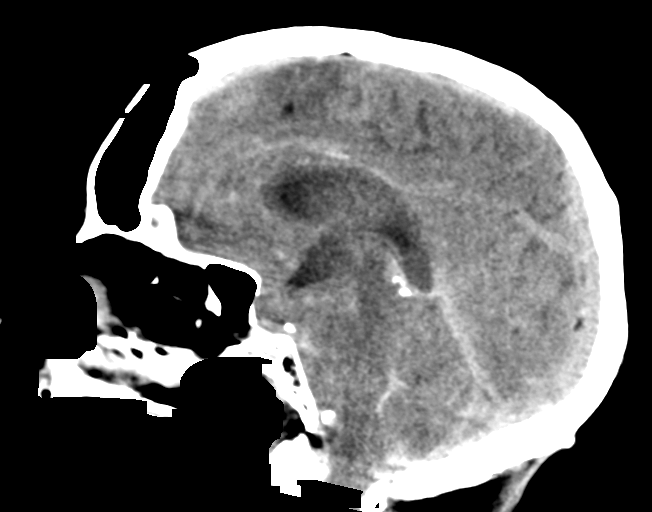
[im 35/52  brain]
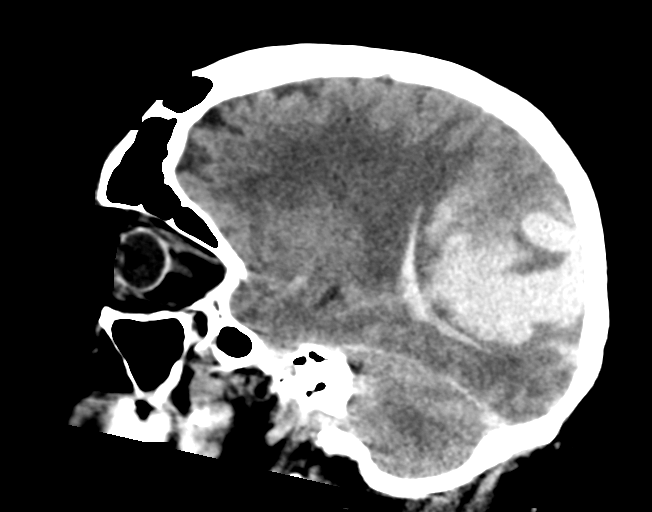

[15 of 47 positions shown; findings below may reference images not displayed]

FINDINGS: Brain: The left parieto-occipital hematoma has enlarged
substantially and now measures 6.8 x 5.5 x 4.6 cm (approximately 86
cc). There is increasing surrounding edema with mass effect on the
left lateral ventricle. There is new, relatively diffuse
subarachnoid hemorrhage including throughout the basilar cisterns.
There is also new diffuse intraventricular hemorrhage, moderate in
volume.

There is new brain herniation with diffuse effacement of the basilar
cisterns. The brainstem is compressed with low density diffusely
throughout the midbrain and pons consistent with edema/infarct.
There is inferior cerebellar tonsillar herniation and uncal
herniation. There is no midline shift. An acute right MCA infarct is
again noted though is less conspicuous in some regions than on the
prior CT which may be in part due to some cortical reperfusion,
residual contrast from recent CT examinations, as well as developing
diffuse cerebral edema. There is no extra-axial fluid collection.

Vascular: Calcified atherosclerosis at the skull base.

Skull: No fracture or focal osseous lesion.

Sinuses/Orbits: Paranasal sinuses and mastoid air cells are clear.
Left cataract extraction.

Other: None.
IMPRESSION: Enlargement of left parieto-occipital hemorrhage with worsening
edema, new diffuse subarachnoid and intraventricular hemorrhage, and
severe herniation.

Critical Value/emergent results were called by telephone at the time
of interpretation on 06/15/2018 at [DATE] to Dr. Merrick, who
verbally acknowledged these results.

## 2019-07-27 IMAGING — CT CT HEAD CODE STROKE
3 series · 14 of 47 positions shown, 16 images · non-contrast
Comparison: Prior CT from 10/01/2016.

CLINICAL DATA: Code stroke. Initial evaluation for acute altered
mental status.

EXAM:
CT HEAD WITHOUT CONTRAST
TECHNIQUE: Contiguous axial images were obtained from the base of the skull
through the vertex without intravenous contrast.

[Series 2: head code stroke wo · axial · 0.49mm/px · z∈[+1520,+1650]mm · 8 of 32 slices shown, 10 images]
[im 3/32  brain]
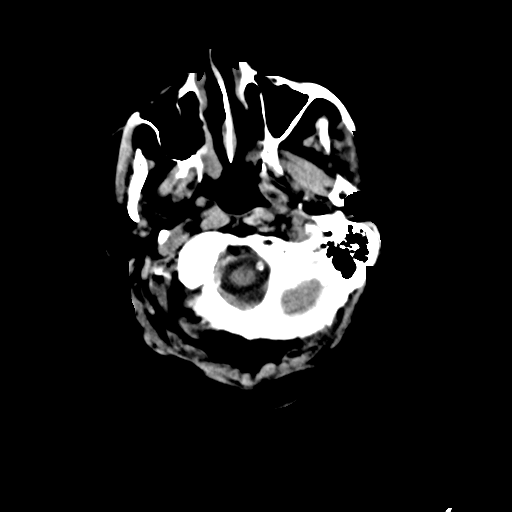
[im 3/32  bone]
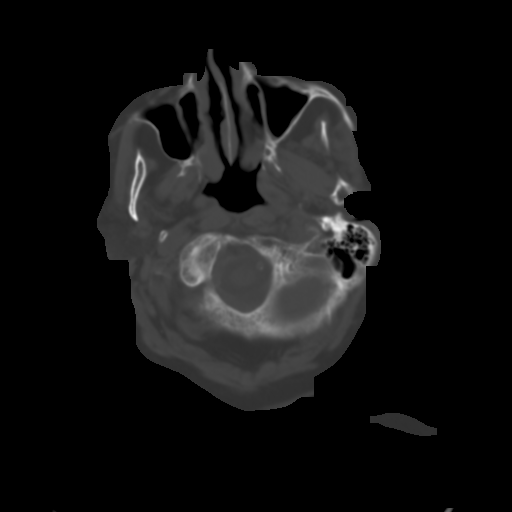
[im 7/32  brain]
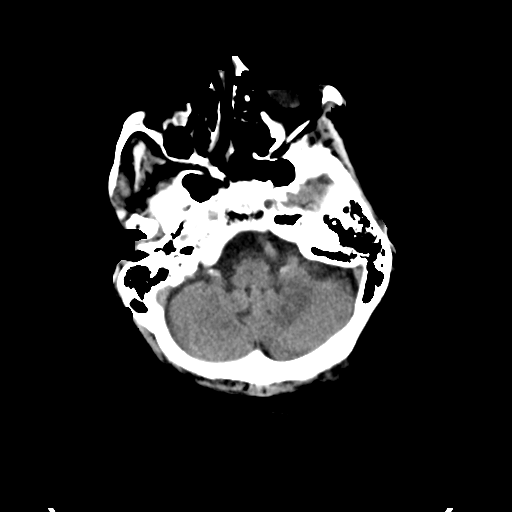
[im 10/32  brain]
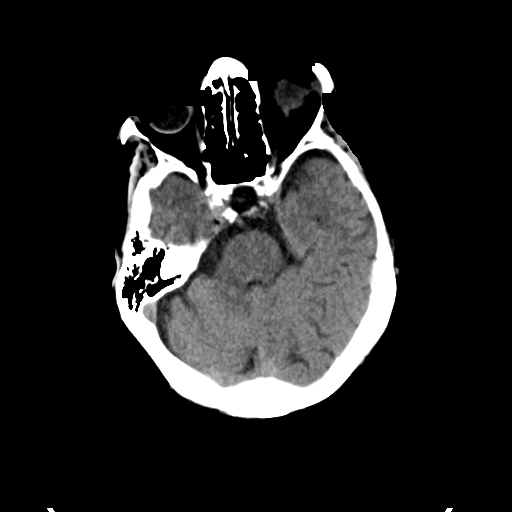
[im 14/32  brain]
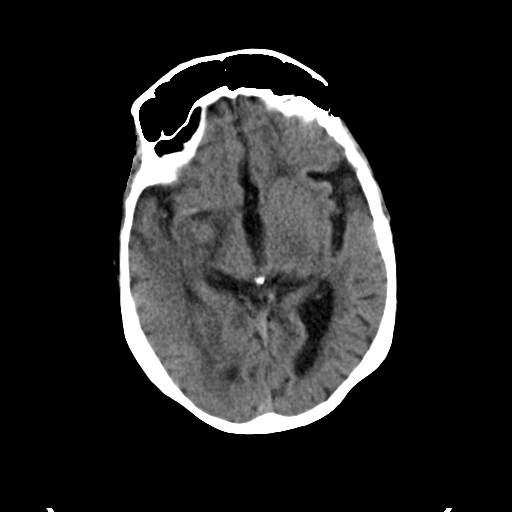
[im 18/32  brain]
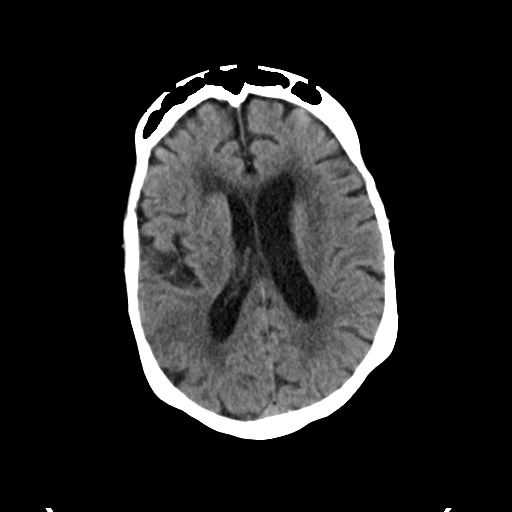
[im 18/32  bone]
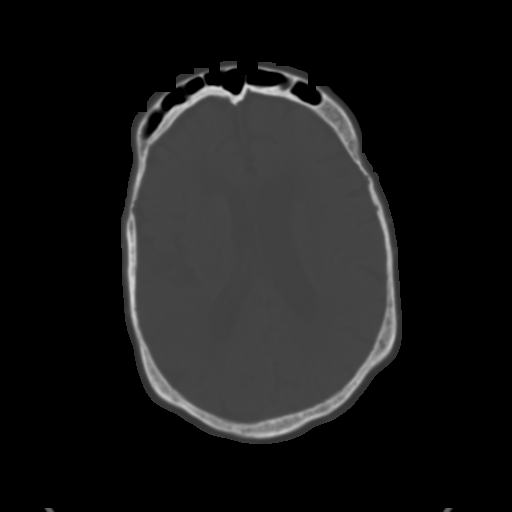
[im 22/32  brain]
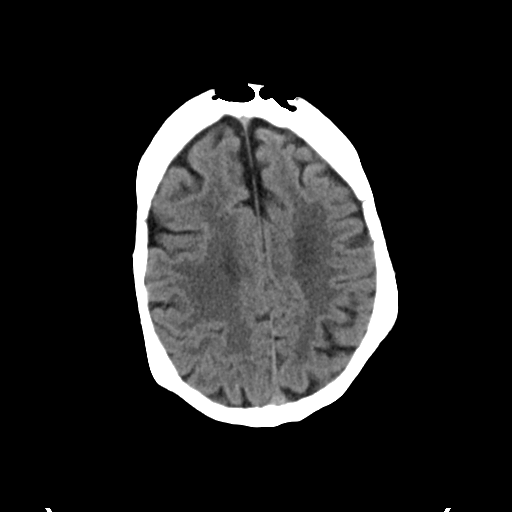
[im 25/32  brain]
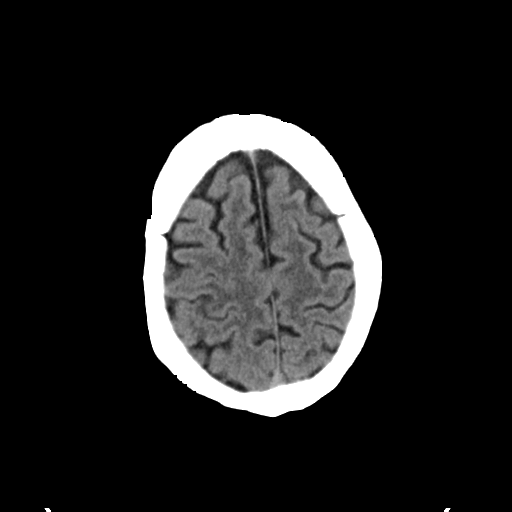
[im 29/32  brain]
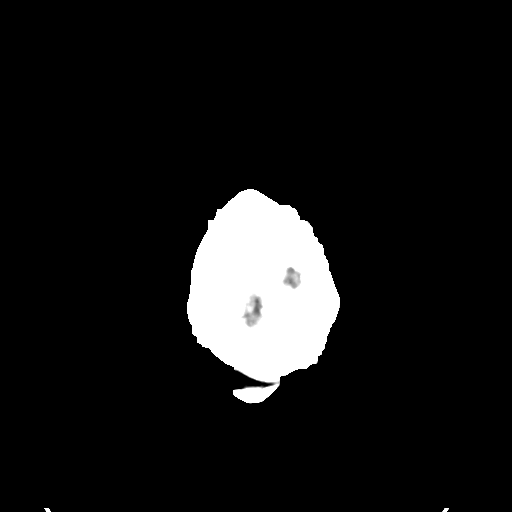

[Series 4: coronal soft tissue · coronal · 0.32mm/px · 3 of 70 slices shown]
[im 24/70  brain]
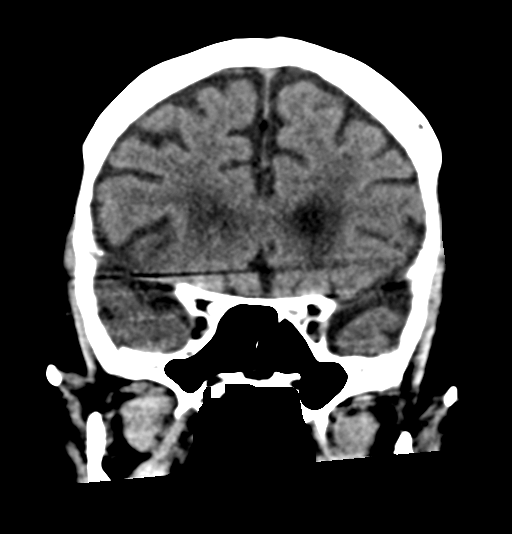
[im 31/70  brain]
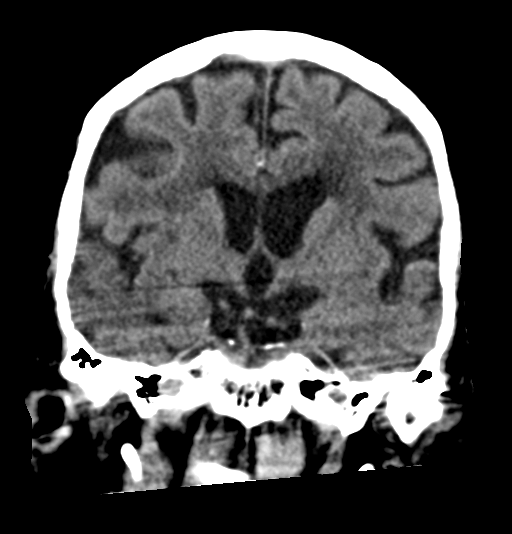
[im 39/70  brain]
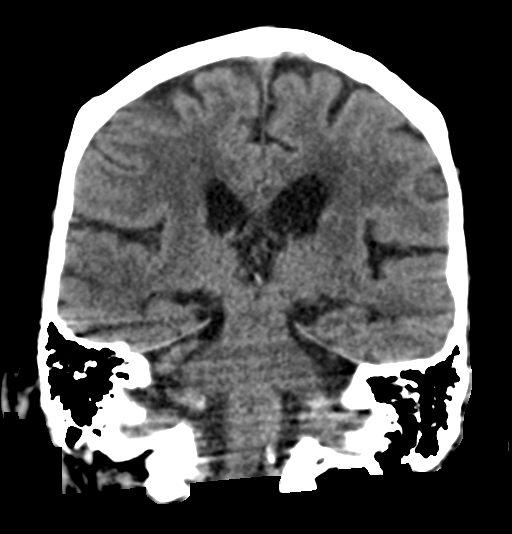

[Series 5: sagittal soft tissue · sagittal · 0.33mm/px · 3 of 55 slices shown]
[im 19/55  brain]
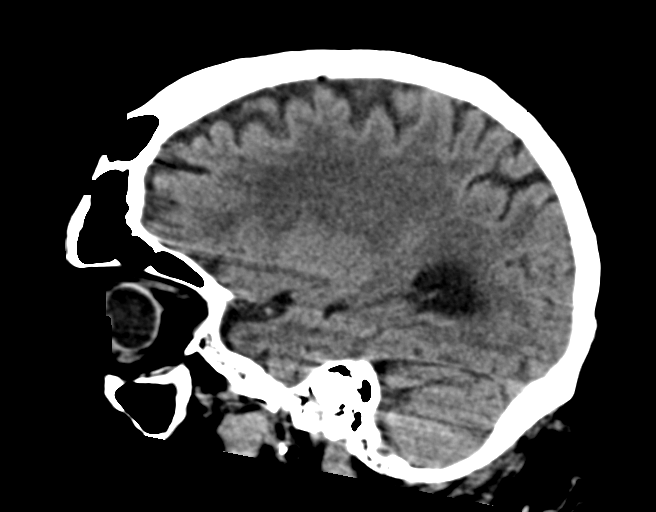
[im 28/55  brain]
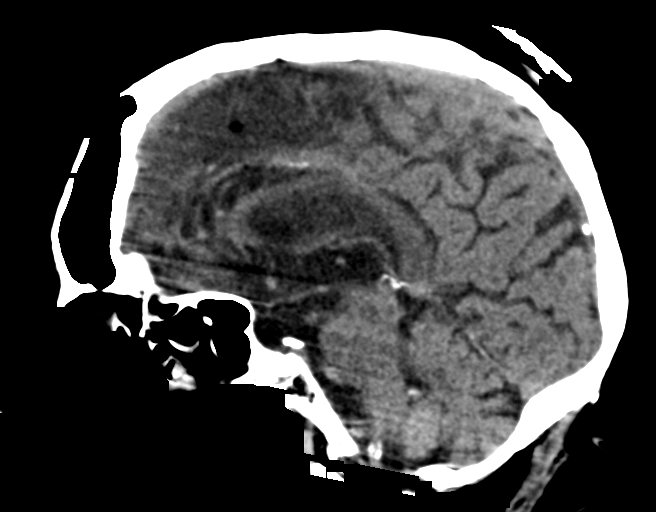
[im 37/55  brain]
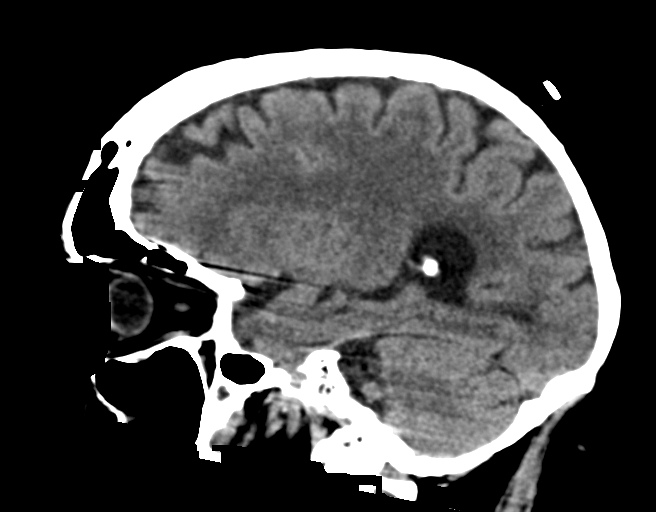

[14 of 47 positions shown; findings below may reference images not displayed]

FINDINGS: Brain: Generalized age-related cerebral atrophy with chronic small
vessel ischemic disease, progressed relative to most recent CT from
10/01/2016. Few scattered remote lacunar infarcts seen involving the
bilateral basal ganglia. No acute intracranial hemorrhage.

Loss of gray-white matter differentiation seen involving the right
temporal lobe, involving the right M1, and to as well as portions of
the right M3 segments. Right insula is relatively well maintained,
although there may be some involvement inferiorly. Basal ganglia
preserved as is the internal capsule. Supra ganglionic gray-white
matter differentiation relatively well maintained as well.

No mass lesion or midline shift. No hydrocephalus. No extra-axial
fluid collection. Subcentimeter hyperdensity along the anterior falx
slightly more prominent as compared to previous exam, but favored to
be chronic in nature.

Vascular: Calcified atherosclerosis at the skull base. Asymmetric
hyperdensity at the right M1 and proximal M2 segments, suspicious
for large vessel occlusion.

Skull: Scalp soft tissues within normal limits.  Calvarium intact.

Sinuses/Orbits: Globes and orbital soft tissues demonstrate no acute
finding. Patient status post ocular lens replacement on the left.
Mild chronic mucoperiosteal thickening within the ethmoidal air
cells and maxillary sinuses. Paranasal sinuses are otherwise clear.
No mastoid effusion.

Other: None.

ASPECTS ([place] Stroke Program Early CT Score)

- Ganglionic level infarction (caudate, lentiform nuclei, internal
capsule, insula, M1-M3 cortex): 4

- Supraganglionic infarction (M4-M6 cortex): 3

Total score (0-10 with 10 being normal): 7
IMPRESSION: 1. Hyperdense right M1 segment, consistent with large vessel
occlusion. Evidence for evolving right MCA ischemia within the right
temporal lobe. No acute intracranial hemorrhage.
2. ASPECTS is 7.
3. Moderately advanced cerebral atrophy with chronic small vessel
ischemic disease, progressed relative to 8974.

Critical Value/emergent results were called by telephone at the time
of interpretation on 06/15/2018 at [DATE] to Dr. SAKETH MACY ,
who verbally acknowledged these results.
